# Patient Record
Sex: Male | Born: 1935 | Race: White | Hispanic: No | Smoking: Current every day smoker
Health system: Southern US, Community
[De-identification: ages and names within clinical notes are randomized; demographics above are authoritative.]

## PROBLEM LIST (undated history)

## (undated) DIAGNOSIS — E538 Deficiency of other specified B group vitamins: Secondary | ICD-10-CM

## (undated) DIAGNOSIS — S72001A Fracture of unspecified part of neck of right femur, initial encounter for closed fracture: Secondary | ICD-10-CM

## (undated) DIAGNOSIS — Z9119 Patient's noncompliance with other medical treatment and regimen: Secondary | ICD-10-CM

## (undated) DIAGNOSIS — I4892 Unspecified atrial flutter: Secondary | ICD-10-CM

## (undated) DIAGNOSIS — I10 Essential (primary) hypertension: Secondary | ICD-10-CM

## (undated) DIAGNOSIS — R55 Syncope and collapse: Secondary | ICD-10-CM

## (undated) DIAGNOSIS — G629 Polyneuropathy, unspecified: Secondary | ICD-10-CM

## (undated) DIAGNOSIS — E46 Unspecified protein-calorie malnutrition: Secondary | ICD-10-CM

## (undated) DIAGNOSIS — K56609 Unspecified intestinal obstruction, unspecified as to partial versus complete obstruction: Secondary | ICD-10-CM

## (undated) DIAGNOSIS — K567 Ileus, unspecified: Secondary | ICD-10-CM

## (undated) DIAGNOSIS — K859 Acute pancreatitis without necrosis or infection, unspecified: Secondary | ICD-10-CM

## (undated) DIAGNOSIS — E785 Hyperlipidemia, unspecified: Secondary | ICD-10-CM

## (undated) HISTORY — DX: Acute pancreatitis without necrosis or infection, unspecified: K85.90

## (undated) HISTORY — DX: Hyperlipidemia, unspecified: E78.5

## (undated) HISTORY — DX: Syncope and collapse: R55

## (undated) HISTORY — DX: Fracture of unspecified part of neck of right femur, initial encounter for closed fracture: S72.001A

## (undated) HISTORY — DX: Deficiency of other specified B group vitamins: E53.8

## (undated) HISTORY — DX: Unspecified intestinal obstruction, unspecified as to partial versus complete obstruction: K56.609

## (undated) HISTORY — PX: BACK SURGERY: SHX140

## (undated) HISTORY — DX: Unspecified atrial flutter: I48.92

---

## 1963-10-17 HISTORY — PX: LAMINECTOMY: SHX219

## 1999-01-30 ENCOUNTER — Emergency Department (HOSPITAL_COMMUNITY): Admission: EM | Admit: 1999-01-30 | Discharge: 1999-01-30 | Payer: Self-pay | Admitting: Emergency Medicine

## 2002-07-15 ENCOUNTER — Encounter: Payer: Self-pay | Admitting: Family Medicine

## 2002-07-15 ENCOUNTER — Encounter: Admission: RE | Admit: 2002-07-15 | Discharge: 2002-07-15 | Payer: Self-pay | Admitting: Family Medicine

## 2002-09-17 ENCOUNTER — Ambulatory Visit (HOSPITAL_BASED_OUTPATIENT_CLINIC_OR_DEPARTMENT_OTHER): Admission: RE | Admit: 2002-09-17 | Discharge: 2002-09-17 | Payer: Self-pay | Admitting: Surgery

## 2002-09-17 ENCOUNTER — Encounter (INDEPENDENT_AMBULATORY_CARE_PROVIDER_SITE_OTHER): Payer: Self-pay | Admitting: *Deleted

## 2004-06-03 ENCOUNTER — Encounter: Admission: RE | Admit: 2004-06-03 | Discharge: 2004-06-03 | Payer: Self-pay | Admitting: Family Medicine

## 2004-08-25 ENCOUNTER — Ambulatory Visit: Payer: Self-pay | Admitting: Family Medicine

## 2005-01-23 ENCOUNTER — Ambulatory Visit: Payer: Self-pay | Admitting: Family Medicine

## 2005-01-23 ENCOUNTER — Encounter: Admission: RE | Admit: 2005-01-23 | Discharge: 2005-01-23 | Payer: Self-pay | Admitting: Family Medicine

## 2005-01-26 ENCOUNTER — Ambulatory Visit: Payer: Self-pay | Admitting: Family Medicine

## 2005-02-06 ENCOUNTER — Ambulatory Visit (HOSPITAL_BASED_OUTPATIENT_CLINIC_OR_DEPARTMENT_OTHER): Admission: RE | Admit: 2005-02-06 | Discharge: 2005-02-06 | Payer: Self-pay | Admitting: General Surgery

## 2005-06-21 ENCOUNTER — Ambulatory Visit: Payer: Self-pay | Admitting: Pulmonary Disease

## 2005-07-14 ENCOUNTER — Ambulatory Visit: Payer: Self-pay

## 2005-07-18 ENCOUNTER — Ambulatory Visit: Payer: Self-pay | Admitting: Pulmonary Disease

## 2005-07-24 ENCOUNTER — Ambulatory Visit: Admission: RE | Admit: 2005-07-24 | Discharge: 2005-07-24 | Payer: Self-pay | Admitting: Pulmonary Disease

## 2005-07-24 ENCOUNTER — Ambulatory Visit: Payer: Self-pay | Admitting: Pulmonary Disease

## 2005-08-21 ENCOUNTER — Ambulatory Visit: Payer: Self-pay | Admitting: Pulmonary Disease

## 2010-09-17 ENCOUNTER — Inpatient Hospital Stay (HOSPITAL_COMMUNITY)
Admission: EM | Admit: 2010-09-17 | Discharge: 2010-09-22 | Payer: Self-pay | Source: Home / Self Care | Attending: Internal Medicine | Admitting: Internal Medicine

## 2010-09-18 ENCOUNTER — Encounter (INDEPENDENT_AMBULATORY_CARE_PROVIDER_SITE_OTHER): Payer: Self-pay | Admitting: Internal Medicine

## 2010-09-22 ENCOUNTER — Encounter: Payer: Self-pay | Admitting: Internal Medicine

## 2010-09-26 ENCOUNTER — Ambulatory Visit: Payer: Self-pay | Admitting: Internal Medicine

## 2010-10-05 ENCOUNTER — Telehealth (INDEPENDENT_AMBULATORY_CARE_PROVIDER_SITE_OTHER): Payer: Self-pay | Admitting: Radiology

## 2010-10-05 ENCOUNTER — Encounter: Payer: Self-pay | Admitting: Internal Medicine

## 2010-10-05 DIAGNOSIS — I739 Peripheral vascular disease, unspecified: Secondary | ICD-10-CM

## 2010-10-06 ENCOUNTER — Encounter: Payer: Self-pay | Admitting: Cardiology

## 2010-10-06 ENCOUNTER — Encounter (HOSPITAL_COMMUNITY)
Admission: RE | Admit: 2010-10-06 | Discharge: 2010-11-15 | Payer: Self-pay | Source: Home / Self Care | Attending: Internal Medicine | Admitting: Internal Medicine

## 2010-10-06 ENCOUNTER — Ambulatory Visit: Payer: Self-pay

## 2010-10-06 ENCOUNTER — Encounter: Payer: Self-pay | Admitting: Internal Medicine

## 2010-10-20 ENCOUNTER — Telehealth: Payer: Self-pay | Admitting: Internal Medicine

## 2010-11-01 ENCOUNTER — Ambulatory Visit
Admission: RE | Admit: 2010-11-01 | Discharge: 2010-11-01 | Payer: Self-pay | Source: Home / Self Care | Attending: Internal Medicine | Admitting: Internal Medicine

## 2010-11-01 DIAGNOSIS — I4891 Unspecified atrial fibrillation: Secondary | ICD-10-CM | POA: Insufficient documentation

## 2010-11-01 DIAGNOSIS — I255 Ischemic cardiomyopathy: Secondary | ICD-10-CM | POA: Insufficient documentation

## 2010-11-01 DIAGNOSIS — D509 Iron deficiency anemia, unspecified: Secondary | ICD-10-CM | POA: Insufficient documentation

## 2010-11-10 ENCOUNTER — Telehealth: Payer: Self-pay | Admitting: Internal Medicine

## 2010-11-17 NOTE — Assessment & Plan Note (Addendum)
Summary: Cardiology Nuclear Testing  Nuclear Med Background Indications for Stress Test: Evaluation for Ischemia, Post Hospital   History: COPD, Echo, Emphysema  History Comments: 09/18/10 Echo NML EF; 09/21/10 paroxsymal tach.   Symptoms: DOE, Palpitations, SOB, Syncope    Nuclear Pre-Procedure Cardiac Risk Factors: Hypertension, Lipids, NIDDM, Smoker Caffeine/Decaff Intake: None NPO After: 6:00 PM Lungs: clear IV 0.9% NS with Angio Cath: 22g     IV Site: R Wrist IV Started by: Irean Hong, RN Chest Size (in) 46     Height (in): 70 Weight (lb): 206 BMI: 29.66 Tech Comments: Held metoprolol this am. The patient took glipizide, and metformin this am. FBS here at 9:20AM was 80, cranberry juice given.  Nuclear Med Study 1 or 2 day study:  1 day     Stress Test Type:  Eugenie Birks Reading MD:  Olga Millers, MD     Referring MD:  S.Klein Resting Radionuclide:  Technetium 17m Tetrofosmin     Resting Radionuclide Dose:  11.0 mCi  Stress Radionuclide:  Technetium 7m Tetrofosmin     Stress Radionuclide Dose:  33.0 mCi   Stress Protocol  Max Systolic BP: 153 mm Hg Lexiscan: 0.4 mg   Stress Test Technologist:  Milana Na, EMT-P     Nuclear Technologist:  Doyne Keel, CNMT  Stress Procedure  The patient received IV Lexiscan 0.4 mg over 15-seconds.  Technetium 20m Tetrofosmin injected at 30-seconds.  There were no significant changes and occ pvcs/pacs with infusion.  Quantitative spect images were obtained after a 45 minute delay.  QPS Raw Data Images:  There is no interference from other nuclear activity. Stress Images:  There is decreased uptake in the inferior wall at the base. Rest Images:  There is decreased uptake in the inferior wall at the base, less prominent compared to the stress images. 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 Transient Ischemic Dilatation:  1.16  (Normal <1.22)  Lung/Heart  Ratio:  0.21  (Normal <0.45)  Quantitative Gated Spect Images QGS EDV:  134 ml QGS ESV:  79 ml QGS EF:  41 % QGS cine images:  Inferobasal akinesis; LVE.   Overall Impression  Exercise Capacity: Lexiscan with no exercise. BP Response: Normal blood pressure response. Clinical Symptoms: No chest pain ECG Impression: No significant ST segment change suggestive of ischemia. Overall Impression: Abnormal lexiscan nuclear study with prior inferobasal infarct and mild peri-infarct ischemia.  Appended Document: Cardiology Nuclear Testing lmfcb Claris Gladden, RN, BSN   10/18/10 1308  Appended Document: Cardiology Nuclear Testing adv pt daughter of results and f/u appt for 1/17 at 2:00. Claris Gladden, RN, BSN    Appended Document: Cardiology Nuclear Testing SPOKE WITH DAUGHTER HAD APPT ON 11/01/10 AND DISCUSSED MYOVIEW HAS RETURN APPT THE END OF FEB./CY

## 2010-11-17 NOTE — Letter (Signed)
Summary: [facility]  [facility]   Imported By: Marylou Mccoy 10/12/2010 17:13:28  _____________________________________________________________________  External Attachment:    Type:   Image     Comment:   External Document

## 2010-11-17 NOTE — Progress Notes (Signed)
Summary: nuc pre-procedure  Phone Note Outgoing Call   Call placed by: Domenic Polite, CNMT,  October 05, 2010 10:55 AM Call placed to: Patient Reason for Call: Confirm/change Appt Summary of Call: Reviewed information on Myoview Information Sheet (see scanned document for further details).  Spoke with patient.      Nuclear Med Background Indications for Stress Test: Evaluation for Ischemia, Post Hospital   History: COPD, Echo, Emphysema  History Comments: 09/18/10 Echo NML EF; 09/21/10 paroxsymal tach.   Symptoms: DOE, Syncope    Nuclear Pre-Procedure Cardiac Risk Factors: Hypertension, Lipids, NIDDM, Smoker

## 2010-11-17 NOTE — Miscellaneous (Signed)
Summary: Orders Update  Clinical Lists Changes  Problems: Added new problem of UNSPECIFIED PERIPHERAL VASCULAR DISEASE (ICD-443.9) Orders: Added new Test order of Arterial Duplex Lower Extremity (Arterial Duplex Low) - Signed 

## 2010-11-17 NOTE — Progress Notes (Signed)
Summary: pt daughter rtn call   Phone Note Call from Patient Call back at [phone]   Caller: Daughter/Preston Moore Reason for Call: Talk to Nurse, Talk to Doctor Summary of Call: pt daughter rtn call she is in her office so if you call and get answering machine just leave a message where she can reach you and she will call back. Initial call taken by: Omer Jack,  October 20, 2010 10:52 AM  Follow-up for Phone Call        spoke w/pt daughter and adv of results of stress test and moved appt to 1/17 at 2:00.  Follow-up by: Claris Gladden RN,  October 20, 2010 1:23 PM

## 2010-11-17 NOTE — Progress Notes (Signed)
Summary: calling about medications  Medications Added VICODIN ES 7.5-750 MG TABS (HYDROCODONE-ACETAMINOPHEN) three times a day       Phone Note Call from Patient Call back at [phone]   Caller: Daughter/ Cyndi Lennert Summary of Call: Pt daughter request call about the pt medications Initial call taken by: Judie Grieve,  November 10, 2010 10:02 AM  Follow-up for Phone Call        daughter called to update fathers med list.  Follow-up by: Claris Gladden RN,  November 10, 2010 2:26 PM    New/Updated Medications: VICODIN ES 7.5-750 MG TABS (HYDROCODONE-ACETAMINOPHEN) three times a day

## 2010-11-17 NOTE — Assessment & Plan Note (Signed)
Summary: eph/f/u for stress, monitor & LEA/jml  Medications Added PENTOXIFYLLINE CR 400 MG CR-TABS (PENTOXIFYLLINE) 1 by mouth three times a day METFORMIN HCL 850 MG TABS (METFORMIN HCL) 1 by mouth two times a day MULTIVITAMINS   TABS (MULTIPLE VITAMIN) 1 by mouth daily GLUCOTROL 10 MG TABS (GLIPIZIDE) 1 by mouth two times a day SIMVASTATIN 20 MG TABS (SIMVASTATIN) 1 by mouth daily ENALAPRIL MALEATE 2.5 MG TABS (ENALAPRIL MALEATE) 1 by mouth daily-- HOLD PROTONIX 40 MG TBEC (PANTOPRAZOLE SODIUM) 1 by mouth two times a day METOPROLOL TARTRATE 25 MG TABS (METOPROLOL TARTRATE) 1 by mouth two times a day      Allergies Added: NKDA  History of Present Illness: [name] is seen following a recent hospitalization for syncope attributed to orthostatic intolerance.  While in hospital he was n mias including atrial flutter as well as SVT thought by me to be a DNR T. Echo demonstrated normal left ventricular function with an inferior wall motion abnormality. He also somehow iron deficiency anemia and Coumadin was withheld.  He comes in today saying he feels great. His daughter is not convinced that he as well as he says. Interval Myoview scanning demonstrated ejection fraction 41% with prior infarct in peri-infarct ischemia A3 week is not recorder demonstrated frequent episodes of atrial tachycardia without clear evidence of atrial fibrillation. Heart rates were frequently in the 120s 130s. No clear atrial fibrillation was noted.    Current Medications (verified): 1)  Pentoxifylline Cr 400 Mg Cr-Tabs (Pentoxifylline) .Marland Kitchen.. 1 By Mouth Three Times A Day 2)  Metformin Hcl 850 Mg Tabs (Metformin Hcl) .Marland Kitchen.. 1 By Mouth Two Times A Day 3)  Aspirin 81 Mg  Tabs (Aspirin) .Marland Kitchen.. 1 By Mouth Daily 4)  Multivitamins   Tabs (Multiple Vitamin) .Marland Kitchen.. 1 By Mouth Daily 5)  Glucotrol 10 Mg Tabs (Glipizide) .Marland Kitchen.. 1 By Mouth Two Times A Day 6)  Simvastatin 20 Mg Tabs (Simvastatin) .Marland Kitchen.. 1 By Mouth Daily 7)  Enalapril  Maleate 2.5 Mg Tabs (Enalapril Maleate) .Marland Kitchen.. 1 By Mouth Daily-- Hold 8)  Protonix 40 Mg Tbec (Pantoprazole Sodium) .Marland Kitchen.. 1 By Mouth Two Times A Day 9)  Metoprolol Tartrate 25 Mg Tabs (Metoprolol Tartrate) .Marland Kitchen.. 1 By Mouth Two Times A Day  Allergies (verified): No Known Drug Allergies  Past History:  Past Medical History: Last updated: 10/31/2010 Diabetes Type 2 Hyperlipidemia Hypertension Neurologic Disorder Syncope  Family History: Last updated: 10/31/2010 Negative FH of Diabetes, Hypertension, or Coronary Artery Disease  Social History: Last updated: 10/31/2010 Widowed  Tobacco Use - Yes.  Alcohol Use - yes  Vital Signs:  Patient profile:   77 year old male Height:      70 inches Weight:      212 pounds BMI:     30.53 Pulse rate:   93 / minute Resp:     16 per minute BP sitting:   137 / 79  (right arm)  Vitals Entered By: Marrion Coy, CNA (November 01, 2010 2:35 PM)  Physical Exam  General:  The patient was alert and oriented in no acute distress. HEENT Normal.  Neck veins were flat, carotids were brisk.  Lungs were clear.  Heart sounds were regular without murmurs or gallops.  Abdomen was soft with active bowel sounds. There is no clubbing cyanosis or edema. Skin Warm and dry    Impression & Recommendations:  Problem # 1:  ATRIAL FIBRILLATION (ICD-427.31) The patient has had atrial fibrillation/flutter. He will need anticoagulation once we get clearance from  GI to its being okay to resume. Given the paucity of symptoms, I am reluctant to begin rate control and medications as he is sometimes he towards bradycardia noted in hospital and treated to obstructive sleep apnea; he has also had some bradycardia noted on his monitor His updated medication list for this problem includes:    Metoprolol Tartrate 25 Mg Tabs (Metoprolol tartrate) .Marland Kitchen... 1 by mouth two times a day  Problem # 2:  ISCHEMIC CARDIOMYOPATHY (ICD-414.8) We will plan to pursue empiric therapy for  this. He is on statins already. We'll continue him on beta blockers and ACE inhibitors for his modest left ventricular dysfunction. Again in the absence of symptoms will not pursue this further His updated medication list for this problem includes:    Enalapril Maleate 2.5 Mg Tabs (Enalapril maleate) .Marland Kitchen... 1 by mouth daily-- hold    Metoprolol Tartrate 25 Mg Tabs (Metoprolol tartrate) .Marland Kitchen... 1 by mouth two times a day  Problem # 3:  ANEMIA, IRON DEFICIENCY (ICD-280.9) he has been on iron repletion therapy. He'll followup with GI  Patient Instructions: 1)  Your physician recommends that you schedule a follow-up appointment in: 4-6 weeks. 2)  Your physician has recommended you make the following change in your medication: Stop Aspirin. Prescriptions: ENALAPRIL MALEATE 2.5 MG TABS (ENALAPRIL MALEATE) 1 by mouth daily-- HOLD  #30 x 11   Entered by:   Claris Gladden RN   Authorized by:   Nathen May, MD, [facility]   Signed by:   Claris Gladden RN on 11/01/2010   Method used:   Electronically to        [facility] (retail)       [address]       [place], [place]  [address]       Ph: [phone] or [phone]       Fax: [phone]   RxID:   [id number]

## 2010-11-23 ENCOUNTER — Encounter: Payer: Self-pay | Admitting: Internal Medicine

## 2010-11-29 ENCOUNTER — Other Ambulatory Visit: Payer: Self-pay | Admitting: Gastroenterology

## 2010-11-29 ENCOUNTER — Ambulatory Visit (HOSPITAL_COMMUNITY)
Admission: RE | Admit: 2010-11-29 | Discharge: 2010-11-29 | Disposition: A | Discharge status: Home / Self Care | Payer: Medicare Other | Source: Ambulatory Visit | Attending: Gastroenterology | Admitting: Gastroenterology

## 2010-11-29 DIAGNOSIS — K5520 Angiodysplasia of colon without hemorrhage: Secondary | ICD-10-CM | POA: Insufficient documentation

## 2010-11-29 DIAGNOSIS — K269 Duodenal ulcer, unspecified as acute or chronic, without hemorrhage or perforation: Secondary | ICD-10-CM | POA: Insufficient documentation

## 2010-11-29 DIAGNOSIS — K5730 Diverticulosis of large intestine without perforation or abscess without bleeding: Secondary | ICD-10-CM | POA: Insufficient documentation

## 2010-11-29 DIAGNOSIS — K449 Diaphragmatic hernia without obstruction or gangrene: Secondary | ICD-10-CM | POA: Insufficient documentation

## 2010-11-29 DIAGNOSIS — K644 Residual hemorrhoidal skin tags: Secondary | ICD-10-CM | POA: Insufficient documentation

## 2010-11-29 DIAGNOSIS — D126 Benign neoplasm of colon, unspecified: Secondary | ICD-10-CM | POA: Insufficient documentation

## 2010-11-29 DIAGNOSIS — K922 Gastrointestinal hemorrhage, unspecified: Secondary | ICD-10-CM | POA: Insufficient documentation

## 2010-11-29 DIAGNOSIS — K648 Other hemorrhoids: Secondary | ICD-10-CM | POA: Insufficient documentation

## 2010-11-29 DIAGNOSIS — Z01812 Encounter for preprocedural laboratory examination: Secondary | ICD-10-CM | POA: Insufficient documentation

## 2010-11-29 DIAGNOSIS — D649 Anemia, unspecified: Secondary | ICD-10-CM | POA: Insufficient documentation

## 2010-11-29 DIAGNOSIS — K209 Esophagitis, unspecified without bleeding: Secondary | ICD-10-CM | POA: Insufficient documentation

## 2010-11-29 DIAGNOSIS — R195 Other fecal abnormalities: Secondary | ICD-10-CM | POA: Insufficient documentation

## 2010-11-29 DIAGNOSIS — K2940 Chronic atrophic gastritis without bleeding: Secondary | ICD-10-CM | POA: Insufficient documentation

## 2010-11-29 LAB — GLUCOSE, CAPILLARY: Glucose-Capillary: 174 mg/dL — ABNORMAL HIGH (ref 70–99)

## 2010-12-12 ENCOUNTER — Encounter: Payer: Self-pay | Admitting: Internal Medicine

## 2010-12-12 ENCOUNTER — Other Ambulatory Visit: Payer: Self-pay | Admitting: Internal Medicine

## 2010-12-12 ENCOUNTER — Ambulatory Visit (INDEPENDENT_AMBULATORY_CARE_PROVIDER_SITE_OTHER): Payer: Medicare Other | Admitting: Internal Medicine

## 2010-12-12 DIAGNOSIS — D509 Iron deficiency anemia, unspecified: Secondary | ICD-10-CM

## 2010-12-12 DIAGNOSIS — I2589 Other forms of chronic ischemic heart disease: Secondary | ICD-10-CM

## 2010-12-12 DIAGNOSIS — I471 Supraventricular tachycardia: Secondary | ICD-10-CM

## 2010-12-12 DIAGNOSIS — D649 Anemia, unspecified: Secondary | ICD-10-CM

## 2010-12-13 LAB — CBC WITH DIFFERENTIAL/PLATELET
Basophils Absolute: 0 10*3/uL (ref 0.0–0.1)
Eosinophils Absolute: 0 10*3/uL (ref 0.0–0.7)
Lymphocytes Relative: 36.8 % (ref 12.0–46.0)
MCHC: 33.5 g/dL (ref 30.0–36.0)
Monocytes Relative: 6.9 % (ref 3.0–12.0)
Neutrophils Relative %: 55.4 % (ref 43.0–77.0)
RBC: 4.59 Mil/uL (ref 4.22–5.81)
RDW: 16.9 % — ABNORMAL HIGH (ref 11.5–14.6)

## 2010-12-22 NOTE — Assessment & Plan Note (Signed)
Summary: 6 wk f/u @ 2pm/ok per gesila/sl/jml      Allergies Added: NKDA  Visit Type:  Follow-up Primary Provider:  Dr Clovis Riley  CC:  no complaints.  History of Present Illness: [name] is seen following a recent hospitalization for syncope attributed to orthostatic intolerance.  While in hospital he was found to have atrial fibrillation and flutter. . Echo demonstrated normal left ventricular function with an inferior wall motion abnormality. He also somehow iron deficiency anemia and Coumadin was withheld. iron was initially started and has suddenly been discontinued    Interval Myoview scanning demonstrated ejection fraction 41% with prior infarct in peri-infarct ischemia A3 week event recorder demonstrated frequent episodes of atrial tachycardia without clear evidence of atrial fibrillation. Heart rates were frequently in the 120s 130s. No clear atrial fibrillation was noted.  At home his oximeter recorded heart rates mostly in the 60-100 range occasionally as high as 110.  He notes no changes in his exercise tolerance  He comes in today saying he feels great and that he felt any better he would know what to do  We reviewed his endoscopy and colonoscopy reports. Adenomas were identified no malignancy was seen   Current Medications (verified): 1)  Pentoxifylline Cr 400 Mg Cr-Tabs (Pentoxifylline) .Marland Kitchen.. 1 By Mouth Three Times A Day 2)  Metformin Hcl 850 Mg Tabs (Metformin Hcl) .Marland Kitchen.. 1 By Mouth Two Times A Day 3)  Multivitamins   Tabs (Multiple Vitamin) .Marland Kitchen.. 1 By Mouth Daily 4)  Glucotrol 10 Mg Tabs (Glipizide) .Marland Kitchen.. 1 By Mouth Two Times A Day 5)  Simvastatin 20 Mg Tabs (Simvastatin) .Marland Kitchen.. 1 By Mouth Daily 6)  Protonix 40 Mg Tbec (Pantoprazole Sodium) .Marland Kitchen.. 1 By Mouth Two Times A Day 7)  Metoprolol Tartrate 25 Mg Tabs (Metoprolol Tartrate) .Marland Kitchen.. 1 By Mouth Two Times A Day 8)  Vicodin Es 7.5-750 Mg Tabs (Hydrocodone-Acetaminophen) .... Three Times A Day  Allergies (verified): No Known  Drug Allergies  Vital Signs:  Patient profile:   77 year old male Height:      70 inches Weight:      206 pounds BMI:     29.66 Pulse rate:   95 / minute BP sitting:   128 / 62  (left arm) Cuff size:   regular  Vitals Entered By: Hardin Negus, RMA (December 12, 2010 2:25 PM)  Physical Exam  General:  The patient was alert and oriented in no acute distress.Neck veins were flat, carotids were brisk. Lungs were clear. Heart sounds were irregular without murmurs or gallops. Abdomen was soft with active bowel sounds. There is no clubbing cyanosis or edema.   Intervals 0.14/2106.36 Axis is 63 Occasional PVC  Impression & Recommendations:  Problem # 1:  ANEMIA, IRON DEFICIENCY (ICD-280.9)  we'll recheck his hemoglobin today;  status post colonoscopy  Orders: TLB-CBC Platelet - w/Differential (85025-CBCD)  Problem # 2:  ISCHEMIC CARDIOMYOPATHY (ICD-414.8) stable on current medications; inclined to try to resume his enalapril The following medications were removed from the medication list:    Enalapril Maleate 2.5 Mg Tabs (Enalapril maleate) .Marland Kitchen... 1 by mouth daily-- hold His updated medication list for this problem includes:    Metoprolol Tartrate 25 Mg Tabs (Metoprolol tartrate) .Marland Kitchen... 1 by mouth two times a day  Problem # 3:  ATRIAL FIBRILLATION (ICD-427.31)  no definite atrial fibrillation; I suspect this all represents an atrial tachycardia His updated medication list for this problem includes:    Metoprolol Tartrate 25 Mg Tabs (Metoprolol tartrate) .Marland KitchenMarland KitchenMarland KitchenMarland Kitchen  1 by mouth two times a day  Orders: EKG w/ Interpretation (93000)  Problem # 4:  UNSPECIFIED PERIPHERAL VASCULAR DISEASE (ICD-443.9) as noted he had ended up is a black toe . somebody put him on   pentoxifylline an issue resolved. He has been on it ever since.  Patient Instructions: 1)  Your physician recommends that you return for lab work in: TODAY CBC  285.9 2)  Your physician recommends that you continue on your  current medications as directed. Please refer to the Current Medication list given to you today. 3)  Your physician wants you to follow-up in:  6 MONTHS WITH DR Logan Bores will receive a reminder letter in the mail two months in advance. If you don't receive a letter, please call our office to schedule the follow-up appointment.

## 2010-12-23 NOTE — Op Note (Signed)
Preston Moore, Preston Moore NO.:  [id number]  MEDICAL RECORD NO.:  [id number]           PATIENT TYPE:  O  LOCATION:  WLEN                         FACILITY:  [facility]  PHYSICIAN:  Petra Kuba, M.D.    DATE OF BIRTH:  Mar 09, 1934  DATE OF PROCEDURE: DATE OF DISCHARGE:                              OPERATIVE REPORT   PROCEDURE:  Esophagogastroduodenoscopy with biopsy.  INDICATION:  The patient with history of esophageal ulcers, duodenal ulcers and GI bleeding.  We want to reevaluate.  Consent was signed after risks, benefits, methods and options were thoroughly discussed multiple times in the past.  ADDITIONAL MEDICINES FOR THIS PROCEDURE:  Fentanyl 40 mcg, Versed 3 mg.  PROCEDURE IN DETAIL:  The video endoscope was inserted by direct vision. He did have a small hiatal hernia.  There was some linear distal esophagitis and small erosions.  The mid and proximal esophagus were normal.  The scope passed into the stomach and he did have some linear antritis mild as well as some proximal gastritis which had a portal gastropathy type of appearance.  The scope was inserted through a normal antrum, normal pylorus, into the duodenal bulb where some shallow erosions were seen which continued on into the second portion of the duodenum.  No bleeding was seen.  The scope was withdrawn back to the bulb and the larger ulcers had healed, but there was still some shallow erosions and ulcers.  The scope was withdrawn back to the stomach and we attempted to retroflex, but not great visualization was done due to inability to hold air, but a quick look at the cardia, fundus, angularis, lesser and greater curve on retroflex confirmed the gastritis and ruled out any other abnormalities.  Straight visualization of the stomach did not reveal any additional findings.  Two biopsies of the antrum and two of the proximal stomach were obtained to rule out Helicobacter and possibly portal  gastropathy.  Air was suctioned.  The scope was slowly withdrawn again.  A good look at the esophagus confirmed the above findings.  The scope was removed.  A quick look at the vocal cords were normal.  The patient tolerated the procedure well. There was no obvious immediate complication.  ENDOSCOPIC DIAGNOSES: 1. Small hiatal hernia with mild-to-moderate distal esophagitis     continuing. 2. Mild antritis with some proximal gastritis which looked more portal     gastropathy status post biopsy. 3. Tiny duodenal erosions both in the bulb and the second portion of     the duodenum. 4. Otherwise normal esophagogastroduodenoscopy.  PLAN:  Continue b.i.d. pump inhibitors.  Continue to hold aspirin and nonsteroidals.  Await pathology.  We will follow up p.r.n. or in 2 months to check on him and make sure no further workup plans are needed.          ______________________________ Petra Kuba, M.D.     MEM/MEDQ  D:  11/29/2010  T:  11/29/2010  Job:  [id number]  cc:   Petra Kuba, M.D. Fax: [phone]  [facility] Clock Fax: [phone]  Electronically Signed by Vida Rigger M.D. on  12/22/2010 07:59:17 AM

## 2010-12-23 NOTE — Op Note (Signed)
Preston Moore, NORDBY NO.:  [id number]  MEDICAL RECORD NO.:  [id number]           PATIENT TYPE:  O  LOCATION:  WLEN                         FACILITY:  [facility]  PHYSICIAN:  Petra Kuba, M.D.    DATE OF BIRTH:  08/05/1934  DATE OF PROCEDURE:  11/29/2010 DATE OF DISCHARGE:                              OPERATIVE REPORT   PROCEDURE:  Colonoscopy with polypectomy and [place] ink injection.  INDICATIONS:  Patient with anemia, guaiac positivity, and GI bleeding; overdue for colonic screening.  Consent was signed after risks, benefits, methods, options thoroughly discussed multiple times in the past.  MEDICINES USED: 1. Fentanyl 160 mcg. 2. Versed 14 mg.  PROCEDURE:  Rectal inspection is pertinent for external hemorrhoids. Small digital exam was negative.  The video colonoscope was inserted and easily advanced around the colon to the cecum.  No signs of bleeding were seen.  On insertion, a rare left-sided early diverticula was seen as well as two small nonbleeding cecal AVMs.  The scope was inserted a short ways in the terminal ileum, which was normal.  No blood was seen coming from above.  The scope was slowly withdrawn.  He did have four small to medium-sized sessile polyps, and in a piecemeal fashion, all were removed.  The proximal transverse and distal transverse were put in the same container.  The largest one was in the mid transverse and was put in a separate container, as was the proximal descending.  There was lots of spasm in removing the last two, and we elected to hot biopsy the base for some residual polyps on both of those.  The one in the midtransverse, after a few snares, we went ahead and elected to inject some [place] ink to mark the spot since it was slightly larger.  There were no signs of bleeding when we were done with the multiple polypectomies.  No other lesions were seen but the rare early diverticula as we slowly withdrew back to the rectum.   Anorectal pull- through and retroflexion confirmed some small hemorrhoids.  The scope was straightened and readvanced a short ways into the left side of the colon.  Air was suctioned, scope removed.  The patient tolerated the procedure well.  There was no obvious immediate complication.  ENDOSCOPIC DIAGNOSES: 1. Internal/external hemorrhoids. 2. Four small to medium-sized sessile polyps in the proximal     descending, distal mid, and proximal transverse.  All removed in a     piecemeal fashion with multiple snares, and the last two having hot     biopsies as above, and [place] ink being injected in the mid     transverse (a total of the two injections and 3 cc). 3. Rare early left-sided diverticula. 4. Two cecal nonbleeding small arteriovenous malformations. 5. Otherwise within normal limits to the terminal ileum.  PLAN:  Continue to hold the aspirin.  Await pathology to determine future screening.  Would recommend Diprivan on repeat colonoscopy and continue workup for a repeat endoscopy to document healing.          ______________________________ Petra Kuba, M.D.  MEM/MEDQ  D:  11/29/2010  T:  11/29/2010  Job:  [id number]  cc:   Dr. [facility]  Electronically Signed by Vida Rigger M.D. on 12/22/2010 07:59:11 AM

## 2010-12-27 LAB — DIFFERENTIAL
Basophils Absolute: 0 10*3/uL (ref 0.0–0.1)
Basophils Absolute: 0 10*3/uL (ref 0.0–0.1)
Basophils Relative: 0 % (ref 0–1)
Basophils Relative: 0 % (ref 0–1)
Basophils Relative: 0 % (ref 0–1)
Eosinophils Absolute: 0.1 10*3/uL (ref 0.0–0.7)
Eosinophils Absolute: 0.1 10*3/uL (ref 0.0–0.7)
Lymphocytes Relative: 30 % (ref 12–46)
Lymphocytes Relative: 39 % (ref 12–46)
Lymphs Abs: 1.8 10*3/uL (ref 0.7–4.0)
Lymphs Abs: 2.6 10*3/uL (ref 0.7–4.0)
Monocytes Absolute: 0.5 10*3/uL (ref 0.1–1.0)
Monocytes Absolute: 0.5 10*3/uL (ref 0.1–1.0)
Monocytes Relative: 7 % (ref 3–12)
Monocytes Relative: 7 % (ref 3–12)
Monocytes Relative: 8 % (ref 3–12)
Neutro Abs: 2.8 10*3/uL (ref 1.7–7.7)
Neutro Abs: 3 10*3/uL (ref 1.7–7.7)
Neutro Abs: 5.6 10*3/uL (ref 1.7–7.7)
Neutrophils Relative %: 46 % (ref 43–77)
Neutrophils Relative %: 51 % (ref 43–77)
Neutrophils Relative %: 64 % (ref 43–77)

## 2010-12-27 LAB — GLUCOSE, CAPILLARY
Glucose-Capillary: 136 mg/dL — ABNORMAL HIGH (ref 70–99)
Glucose-Capillary: 188 mg/dL — ABNORMAL HIGH (ref 70–99)
Glucose-Capillary: 213 mg/dL — ABNORMAL HIGH (ref 70–99)
Glucose-Capillary: 214 mg/dL — ABNORMAL HIGH (ref 70–99)
Glucose-Capillary: 228 mg/dL — ABNORMAL HIGH (ref 70–99)
Glucose-Capillary: 246 mg/dL — ABNORMAL HIGH (ref 70–99)
Glucose-Capillary: 265 mg/dL — ABNORMAL HIGH (ref 70–99)
Glucose-Capillary: 289 mg/dL — ABNORMAL HIGH (ref 70–99)
Glucose-Capillary: 305 mg/dL — ABNORMAL HIGH (ref 70–99)
Glucose-Capillary: 351 mg/dL — ABNORMAL HIGH (ref 70–99)
Glucose-Capillary: 382 mg/dL — ABNORMAL HIGH (ref 70–99)

## 2010-12-27 LAB — HEMOGLOBIN AND HEMATOCRIT, BLOOD
HCT: 27.6 % — ABNORMAL LOW (ref 39.0–52.0)
HCT: 30 % — ABNORMAL LOW (ref 39.0–52.0)
Hemoglobin: 10.4 g/dL — ABNORMAL LOW (ref 13.0–17.0)
Hemoglobin: 9.4 g/dL — ABNORMAL LOW (ref 13.0–17.0)
Hemoglobin: 9.5 g/dL — ABNORMAL LOW (ref 13.0–17.0)

## 2010-12-27 LAB — CBC
HCT: 24.8 % — ABNORMAL LOW (ref 39.0–52.0)
HCT: 25.2 % — ABNORMAL LOW (ref 39.0–52.0)
HCT: 25.2 % — ABNORMAL LOW (ref 39.0–52.0)
HCT: 39.8 % (ref 39.0–52.0)
Hemoglobin: 13.5 g/dL (ref 13.0–17.0)
Hemoglobin: 8.5 g/dL — ABNORMAL LOW (ref 13.0–17.0)
MCH: 33.3 pg (ref 26.0–34.0)
MCHC: 33.7 g/dL (ref 30.0–36.0)
MCHC: 33.9 g/dL (ref 30.0–36.0)
MCV: 98.8 fL (ref 78.0–100.0)
MCV: 99.2 fL (ref 78.0–100.0)
Platelets: 143 10*3/uL — ABNORMAL LOW (ref 150–400)
Platelets: 162 10*3/uL (ref 150–400)
RBC: 2.58 MIL/uL — ABNORMAL LOW (ref 4.22–5.81)
RBC: 3.99 MIL/uL — ABNORMAL LOW (ref 4.22–5.81)
RDW: 12.5 % (ref 11.5–15.5)
RDW: 12.6 % (ref 11.5–15.5)
RDW: 12.7 % (ref 11.5–15.5)
RDW: 12.8 % (ref 11.5–15.5)
WBC: 6 10*3/uL (ref 4.0–10.5)
WBC: 7.6 10*3/uL (ref 4.0–10.5)
WBC: 7.7 10*3/uL (ref 4.0–10.5)

## 2010-12-27 LAB — CARDIAC PANEL(CRET KIN+CKTOT+MB+TROPI)
CK, MB: 1.5 ng/mL (ref 0.3–4.0)
Relative Index: INVALID (ref 0.0–2.5)
Total CK: 145 U/L (ref 7–232)
Total CK: 44 U/L (ref 7–232)
Troponin I: 0.03 ng/mL (ref 0.00–0.06)
Troponin I: 0.03 ng/mL (ref 0.00–0.06)

## 2010-12-27 LAB — COMPREHENSIVE METABOLIC PANEL
ALT: 25 U/L (ref 0–53)
ALT: 26 U/L (ref 0–53)
AST: 36 U/L (ref 0–37)
Albumin: 2.8 g/dL — ABNORMAL LOW (ref 3.5–5.2)
Albumin: 2.9 g/dL — ABNORMAL LOW (ref 3.5–5.2)
Alkaline Phosphatase: 63 U/L (ref 39–117)
Alkaline Phosphatase: 63 U/L (ref 39–117)
BUN: 10 mg/dL (ref 6–23)
BUN: 2 mg/dL — ABNORMAL LOW (ref 6–23)
CO2: 28 mEq/L (ref 19–32)
Calcium: 8 mg/dL — ABNORMAL LOW (ref 8.4–10.5)
Creatinine, Ser: 0.71 mg/dL (ref 0.4–1.5)
GFR calc Af Amer: >60 mL/min (ref >60)
GFR calc non Af Amer: >60 mL/min (ref >60)
Glucose, Bld: 222 mg/dL — ABNORMAL HIGH (ref 70–99)
Potassium: 4 mEq/L (ref 3.5–5.1)
Potassium: 4.5 mEq/L (ref 3.5–5.1)
Sodium: 136 mEq/L (ref 135–145)
Sodium: 138 mEq/L (ref 135–145)
Total Bilirubin: 0.3 mg/dL (ref 0.3–1.2)
Total Bilirubin: 0.4 mg/dL (ref 0.3–1.2)
Total Protein: 4.7 g/dL — ABNORMAL LOW (ref 6.0–8.3)
Total Protein: 5.1 g/dL — ABNORMAL LOW (ref 6.0–8.3)

## 2010-12-27 LAB — POCT CARDIAC MARKERS
Myoglobin, poc: 102 ng/mL (ref 12–200)
Myoglobin, poc: 64.5 ng/mL (ref 12–200)
Troponin i, poc: <0.05 ng/mL (ref 0.00–0.09)
Troponin i, poc: <0.05 ng/mL (ref 0.00–0.09)

## 2010-12-27 LAB — URINALYSIS, ROUTINE W REFLEX MICROSCOPIC
Bilirubin Urine: NEGATIVE
Hgb urine dipstick: NEGATIVE
Specific Gravity, Urine: 1.026 (ref 1.005–1.030)
Urobilinogen, UA: 0.2 mg/dL (ref 0.0–1.0)

## 2010-12-27 LAB — MAGNESIUM
Magnesium: 1.8 mg/dL (ref 1.5–2.5)
Magnesium: 1.8 mg/dL (ref 1.5–2.5)

## 2010-12-27 LAB — STOOL CULTURE

## 2010-12-27 LAB — URINE CULTURE: Culture  Setup Time: 201112040203

## 2010-12-27 LAB — URINE MICROSCOPIC-ADD ON

## 2010-12-27 LAB — TSH: TSH: 1.867 u[IU]/mL (ref 0.350–4.500)

## 2010-12-27 LAB — POCT I-STAT, CHEM 8
Calcium, Ion: 1.07 mmol/L — ABNORMAL LOW (ref 1.12–1.32)
Chloride: 102 mEq/L (ref 96–112)
HCT: 41 % (ref 39.0–52.0)
Potassium: 4.6 mEq/L (ref 3.5–5.1)
Sodium: 137 mEq/L (ref 135–145)

## 2010-12-27 LAB — HEMOCCULT GUIAC POC 1CARD (OFFICE): Fecal Occult Bld: POSITIVE

## 2010-12-27 LAB — APTT: aPTT: 25 seconds (ref 24–37)

## 2010-12-27 LAB — PROTIME-INR: INR: 1.06 (ref 0.00–1.49)

## 2010-12-27 LAB — CLOSTRIDIUM DIFFICILE BY PCR: Toxigenic C. Difficile by PCR: NOT DETECTED

## 2010-12-27 LAB — HEMOGLOBIN A1C: Mean Plasma Glucose: 117 mg/dL — ABNORMAL HIGH (ref <117)

## 2011-03-03 NOTE — Op Note (Signed)
Preston Moore, Preston Moore                 ACCOUNT NO.:  [id number]   MEDICAL RECORD NO.:  [id number]          PATIENT TYPE:  OUT   LOCATION:  CARD                         FACILITY:  [facility]   PHYSICIAN:  Oley Balm. Sung Amabile, M.D. [facility] OF BIRTH:  13-May-1934   DATE OF PROCEDURE:  07/24/2005  DATE OF DISCHARGE:  07/24/2005                                 OPERATIVE REPORT   PROCEDURE:  Cardiopulmonary stress test.   INDICATION FOR TESTING:  Dyspnea.   DESCRIPTION OF PROCEDURE:  Cardiopulmonary stress testing was performed on a  graded treadmill. Testing was stopped due to back pain and dyspnea. Effort  was submaximal. Peak oxygen uptake was 13.8 mL/kg/minute or 61% of predicted  maximum indicating moderate impairment.   At peak exercise, heart rate was 140 beats per minute or 92% of predicted  maximum indicating that cardiovascular limitation was reached. Oxygen pulse  was low suggesting decreased left ventricular function. Blood pressure was  abnormal with a peak blood pressure of 206/97. EKG tracings revealed no  abnormalities.   At peak exercise, minute ventilation was 66 liters per minute or 49% of  predicted maximum indicating that ventilatory reserve remained. Gas exchange  parameters revealed no abnormalities. Baseline spirometer was normal.  Postexercise spirometry revealed no exercise-induced bronchospasm.   SUMMARY:  Moderate exercise impairment due to musculoskeletal pain.  Cardiovascular limitation was reached at peak exercise with a hypertensive  response. Also, oxygen pulse was low suggesting impaired stroke volume. If  not already done, consider echocardiogram for further evaluation. These  results have previously been discussed with Dr. Sherene Sires.           ______________________________  Oley Balm. Sung Amabile, M.D. [facility]     DBS/MEDQ  D:  09/12/2005  T:  09/13/2005  Job:  [id number]

## 2011-03-03 NOTE — Op Note (Signed)
Preston Moore, Preston Moore                 ACCOUNT NO.:  [id number]   MEDICAL RECORD NO.:  [id number]          PATIENT TYPE:  AMB   LOCATION:  DSC                          FACILITY:  [facility]   PHYSICIAN:  Gita Kudo, M.D. DATE OF BIRTH:  Jul 15, 1934   DATE OF PROCEDURE:  02/06/2005  DATE OF DISCHARGE:                                 OPERATIVE REPORT   PREOPERATIVE DIAGNOSIS:  Recurrent, infected sebaceous cyst.   POSTOPERATIVE DIAGNOSIS:  Recurrent, infected sebaceous cyst.   OPERATIVE PROCEDURE:  Excision and packing of recurrent, infected sebaceous  cyst - back.   SURGEON:  Gita Kudo, M.D.   ANESTHESIA:  1% Xylocaine.   CLINICAL SUMMARY:  A 77 year old diabetic male with an infected sebaceous  cyst of his back.  He had a cyst excised in the past and packed by Dr.  Jamey Ripa.  He came back to the office the other day.  It was somewhat red but  I thought we might be able to excise it and close it primarily.  However, it  was infected, he had pus and I could not do so.   DESCRIPTION OF PROCEDURE:  The patient was positioned, prepped and draped in  a standard fashion.  A total of 30 cc of 1% Xylocaine was infiltrated during  the procedure for good analgesia.  An elliptical incision was made,  encompassing the infected cystic material on the left side, the scar in the  middle and the other cyst on the residual right side.  The entire mass was  excised.  The cyst had ruptured and it was difficult to be sure if I had  removed it all.  Bleeders were oversewn with 2-0 chromic and then the wound  was packed with 4 x 4 gauzes and drained.  He will be followed as an  outpatient.      MRL/MEDQ  D:  02/06/2005  T:  02/06/2005  Job:  [id number]   cc:   Tinnie Gens A. Tawanna Cooler, M.D. [facility]

## 2011-03-28 ENCOUNTER — Ambulatory Visit (HOSPITAL_COMMUNITY)
Admission: RE | Admit: 2011-03-28 | Discharge: 2011-03-28 | Disposition: A | Discharge status: Home / Self Care | Payer: Medicare Other | Source: Ambulatory Visit | Attending: Family Medicine | Admitting: Family Medicine

## 2011-03-28 ENCOUNTER — Other Ambulatory Visit (HOSPITAL_COMMUNITY): Payer: Self-pay | Admitting: Family Medicine

## 2011-03-28 DIAGNOSIS — M949 Disorder of cartilage, unspecified: Secondary | ICD-10-CM | POA: Insufficient documentation

## 2011-03-28 DIAGNOSIS — R0789 Other chest pain: Secondary | ICD-10-CM | POA: Insufficient documentation

## 2011-03-28 DIAGNOSIS — R0781 Pleurodynia: Secondary | ICD-10-CM

## 2011-03-28 DIAGNOSIS — M899 Disorder of bone, unspecified: Secondary | ICD-10-CM | POA: Insufficient documentation

## 2011-05-01 ENCOUNTER — Emergency Department (HOSPITAL_COMMUNITY)
Admission: EM | Admit: 2011-05-01 | Discharge: 2011-05-02 | Disposition: A | Discharge status: Home / Self Care | Payer: Medicare Other | Attending: Emergency Medicine | Admitting: Emergency Medicine

## 2011-05-01 ENCOUNTER — Telehealth: Payer: Self-pay | Admitting: Cardiology

## 2011-05-01 DIAGNOSIS — I10 Essential (primary) hypertension: Secondary | ICD-10-CM | POA: Insufficient documentation

## 2011-05-01 DIAGNOSIS — Z794 Long term (current) use of insulin: Secondary | ICD-10-CM | POA: Insufficient documentation

## 2011-05-01 DIAGNOSIS — G5790 Unspecified mononeuropathy of unspecified lower limb: Secondary | ICD-10-CM | POA: Insufficient documentation

## 2011-05-01 DIAGNOSIS — M79609 Pain in unspecified limb: Secondary | ICD-10-CM | POA: Insufficient documentation

## 2011-05-01 DIAGNOSIS — R109 Unspecified abdominal pain: Secondary | ICD-10-CM | POA: Insufficient documentation

## 2011-05-01 DIAGNOSIS — K859 Acute pancreatitis without necrosis or infection, unspecified: Secondary | ICD-10-CM | POA: Insufficient documentation

## 2011-05-01 DIAGNOSIS — E119 Type 2 diabetes mellitus without complications: Secondary | ICD-10-CM | POA: Insufficient documentation

## 2011-05-01 DIAGNOSIS — I4949 Other premature depolarization: Secondary | ICD-10-CM | POA: Insufficient documentation

## 2011-05-01 DIAGNOSIS — R609 Edema, unspecified: Secondary | ICD-10-CM | POA: Insufficient documentation

## 2011-05-01 DIAGNOSIS — K7689 Other specified diseases of liver: Secondary | ICD-10-CM | POA: Insufficient documentation

## 2011-05-01 HISTORY — DX: Essential (primary) hypertension: I10

## 2011-05-01 HISTORY — DX: Polyneuropathy, unspecified: G62.9

## 2011-05-01 LAB — GLUCOSE, CAPILLARY: Glucose-Capillary: 266 mg/dL — ABNORMAL HIGH (ref 70–99)

## 2011-05-01 NOTE — Telephone Encounter (Signed)
Pt's dtr calling re 6 month recall letter he received, and has questions re why he needs a follow up, did something show on last visit, and what would the appt entail?

## 2011-05-01 NOTE — Telephone Encounter (Signed)
Reviewed the recommendations made by Dr Graciela Husbands at Preston Moore's last office visit to follow up in August.  appt was scheduled for Preston Moore.

## 2011-05-02 ENCOUNTER — Emergency Department (HOSPITAL_COMMUNITY): Payer: Medicare Other

## 2011-05-02 ENCOUNTER — Encounter (HOSPITAL_COMMUNITY): Payer: Self-pay | Admitting: Radiology

## 2011-05-02 LAB — URINALYSIS, ROUTINE W REFLEX MICROSCOPIC
Glucose, UA: NEGATIVE mg/dL
Hgb urine dipstick: NEGATIVE
Specific Gravity, Urine: 1.015 (ref 1.005–1.030)
Urobilinogen, UA: 0.2 mg/dL (ref 0.0–1.0)
pH: 5.5 (ref 5.0–8.0)

## 2011-05-02 LAB — COMPREHENSIVE METABOLIC PANEL
AST: 30 U/L (ref 0–37)
Albumin: 3.1 g/dL — ABNORMAL LOW (ref 3.5–5.2)
Alkaline Phosphatase: 81 U/L (ref 39–117)
BUN: 12 mg/dL (ref 6–23)
Creatinine, Ser: 0.92 mg/dL (ref 0.50–1.35)
Potassium: 3.4 mEq/L — ABNORMAL LOW (ref 3.5–5.1)
Total Protein: 6.7 g/dL (ref 6.0–8.3)

## 2011-05-02 LAB — DIFFERENTIAL
Eosinophils Relative: 1 % (ref 0–5)
Monocytes Relative: 13 % — ABNORMAL HIGH (ref 3–12)
Neutrophils Relative %: 40 % — ABNORMAL LOW (ref 43–77)

## 2011-05-02 LAB — GLUCOSE, CAPILLARY: Glucose-Capillary: 235 mg/dL — ABNORMAL HIGH (ref 70–99)

## 2011-05-02 LAB — CBC
HCT: 36.3 % — ABNORMAL LOW (ref 39.0–52.0)
Hemoglobin: 12.5 g/dL — ABNORMAL LOW (ref 13.0–17.0)
WBC: 4.8 10*3/uL (ref 4.0–10.5)

## 2011-05-02 LAB — LIPASE, BLOOD: Lipase: 118 U/L — ABNORMAL HIGH (ref 11–59)

## 2011-05-02 MED ORDER — IOHEXOL 300 MG/ML  SOLN
100.0000 mL | Freq: Once | INTRAMUSCULAR | Status: AC | PRN
  Administered 2011-05-02: 100 mL via INTRAVENOUS

## 2011-05-05 ENCOUNTER — Encounter: Payer: Self-pay | Admitting: Internal Medicine

## 2011-06-08 ENCOUNTER — Ambulatory Visit (INDEPENDENT_AMBULATORY_CARE_PROVIDER_SITE_OTHER): Payer: Medicare Other | Admitting: Internal Medicine

## 2011-06-08 ENCOUNTER — Encounter (INDEPENDENT_AMBULATORY_CARE_PROVIDER_SITE_OTHER): Payer: Medicare Other

## 2011-06-08 ENCOUNTER — Encounter: Payer: Self-pay | Admitting: Internal Medicine

## 2011-06-08 VITALS — BP 132/78 | HR 84 | Ht 70.0 in | Wt 206.0 lb

## 2011-06-08 DIAGNOSIS — R0602 Shortness of breath: Secondary | ICD-10-CM

## 2011-06-08 DIAGNOSIS — R42 Dizziness and giddiness: Secondary | ICD-10-CM

## 2011-06-08 DIAGNOSIS — R0600 Dyspnea, unspecified: Secondary | ICD-10-CM

## 2011-06-08 LAB — BASIC METABOLIC PANEL WITH GFR
BUN: 13 mg/dL (ref 6–23)
CO2: 21 meq/L (ref 19–32)
Calcium: 9.1 mg/dL (ref 8.4–10.5)
Chloride: 108 meq/L (ref 96–112)
Creatinine, Ser: 0.7 mg/dL (ref 0.4–1.5)
GFR: 120.91 mL/min
Glucose, Bld: 150 mg/dL — ABNORMAL HIGH (ref 70–99)
Potassium: 4.3 meq/L (ref 3.5–5.1)
Sodium: 138 meq/L (ref 135–145)

## 2011-06-08 MED ORDER — FUROSEMIDE 20 MG PO TABS: ORAL_TABLET | ORAL | Status: DC

## 2011-06-08 NOTE — Progress Notes (Signed)
  HPI  Preston Moore is a 77 y.o. male  History of atrial arrhythmias and a thromboembolic risk profile with the CHADS VASC of 4  I met him in the hospital early 2012 after presentation with syncope that was thought to be orthostatic in the setting of dehydration. Evaluation at that time demonstrated normal left ventricular function by echo with an inferior wall motion abnormality and Q waves on his EKG suggestive of an old infarct.Myoview scanning demonstrated ejection fraction 41% with prior infarct in peri-infarct ischemia  He also probable COPD and nocturnal bradycardia He was hospitalized and then sent to rehabilitation until a couple weeks ago. He was failing to thrive. He was drinking excessively smoking excessively. He has had episodes of dyspnea particularly at night which he had again last night. Apparently at the nursing home when he had these his saturations were measured at 98% as they were by EMS last night when they were called 6 hours after the onset. He impression at the nursing home was that these were related to anxiety. He has continued to smoke When he was in the emergency room wasn't long as hemoglobin was 12 so the knee was much improved. His potassium was low   Past Medical History  Diagnosis Date  . Hypertension   . Diabetes mellitus   . Neuropathy   . Hyperlipidemia   . Syncope and collapse     No past surgical history on file.  Current Outpatient Prescriptions  Medication Sig Dispense Refill  . enalapril (VASOTEC) 2.5 MG tablet Take 2.5 mg by mouth daily.        Marland Kitchen gabapentin (NEURONTIN) 300 MG capsule Take 300 mg by mouth 3 (three) times daily.        Marland Kitchen HYDROcodone-acetaminophen (VICODIN ES) 7.5-750 MG per tablet Take 1 tablet by mouth 3 (three) times daily.        . insulin aspart (NOVOLOG) 100 UNIT/ML injection Inject into the skin.        . metFORMIN (GLUCOPHAGE) 1000 MG tablet Take 1,000 mg by mouth 2 (two) times daily with a meal.        . metoprolol  tartrate (LOPRESSOR) 25 MG tablet Take 25 mg by mouth 2 (two) times daily.        . multivitamin (THERAGRAN) per tablet Take 1 tablet by mouth daily.        Marland Kitchen omeprazole (PRILOSEC) 20 MG capsule Take 20 mg by mouth daily.        . pentoxifylline (TRENTAL) 400 MG CR tablet Take 400 mg by mouth 3 (three) times daily with meals.        . simvastatin (ZOCOR) 20 MG tablet Take 20 mg by mouth daily.        Marland Kitchen zolpidem (AMBIEN) 10 MG tablet Take 10 mg by mouth at bedtime as needed.          No Known Allergies  Review of Systems negative except from HPI and PMH  Physical Exam Well developed and well nourished in no acute distress HENT normal E scleral and icterus clear Neck Supple JVP 9-10 cm carotids brisk and full Clear to ausculation Regular rate and rhythm, no murmurs gallops or rub Soft with active bowel sounds No clubbing cyanosis 1+Alert and oriented, grossly normal motor and sensory function Skin Warm and Dry  ECG sinus rhythm at 84 different intervals 0.1410/0.40 T Wave flattening  Assessment and  Plan

## 2011-06-08 NOTE — Patient Instructions (Signed)
Your physician has recommended that you wear an event monitor (MCOT 30day). This will be placed today.Event monitors are medical devices that record the heart's electrical activity. Doctors most often [place] these monitors to diagnose arrhythmias. Arrhythmias are problems with the speed or rhythm of the heartbeat. The monitor is a small, portable device. You can wear one while you do your normal daily activities. This is usually used to diagnose what is causing palpitations/syncope (passing out).  Your physician recommends that you have lab work today: bmp/bnp  Your physician recommends that you return for lab work in: 2 weeks- bmp (  Your physician has recommended you make the following change in your medication:  1) Start lasix (furosemide) 20mg  once daily.  Your physician recommends that you schedule a follow-up appointment in: 2 months.

## 2011-06-22 ENCOUNTER — Other Ambulatory Visit (INDEPENDENT_AMBULATORY_CARE_PROVIDER_SITE_OTHER): Payer: Medicare Other | Admitting: *Deleted

## 2011-06-22 DIAGNOSIS — R0989 Other specified symptoms and signs involving the circulatory and respiratory systems: Secondary | ICD-10-CM

## 2011-06-22 DIAGNOSIS — R0600 Dyspnea, unspecified: Secondary | ICD-10-CM

## 2011-06-22 LAB — BASIC METABOLIC PANEL
Calcium: 9.1 mg/dL (ref 8.4–10.5)
GFR: 89.79 mL/min (ref >60.00)
Glucose, Bld: 153 mg/dL — ABNORMAL HIGH (ref 70–99)
Sodium: 139 mEq/L (ref 135–145)

## 2011-06-26 ENCOUNTER — Ambulatory Visit (INDEPENDENT_AMBULATORY_CARE_PROVIDER_SITE_OTHER): Payer: Medicare Other | Admitting: Pulmonary Disease

## 2011-06-26 ENCOUNTER — Encounter: Payer: Self-pay | Admitting: Pulmonary Disease

## 2011-06-26 VITALS — BP 114/60 | HR 95 | Temp 97.9°F | Ht 70.0 in | Wt 207.6 lb

## 2011-06-26 DIAGNOSIS — R0609 Other forms of dyspnea: Secondary | ICD-10-CM

## 2011-06-26 DIAGNOSIS — R0600 Dyspnea, unspecified: Secondary | ICD-10-CM

## 2011-06-26 NOTE — Patient Instructions (Signed)
Will schedule for breathing tests, and see back same day for review. Work on smoking cessation.

## 2011-06-26 NOTE — Assessment & Plan Note (Signed)
The patient is describing 2 episodes of shortness of breath while at rest over the last few months, but is also noting dyspnea with mild to moderate exertional activity.  I suspect he does have some element of obstructive lung disease, and needs to have pulmonary function studies and reevaluation.  He may benefit from a bronchodilator regimen, depending upon the results.  It is unclear whether the patient may have a cardiac issue as well.  We'll go ahead and schedule him for PFTs, and see him back the same day to review

## 2011-06-26 NOTE — Progress Notes (Signed)
  Subjective:    Patient ID: Preston Moore, male    DOB: [birth year], 77 y.o.   MRN: [id number]  HPI The patient is a 77 year old male who I've been asked to see for evaluation of dyspnea.  He notes that he has had decreasing exercise tolerance over the last few years, but states that he has had worsening shortness of breath for the last one month.  He has had 2 episodes where all of a sudden while sitting he feels that he cannot get enough air.  He also notes dyspnea on exertion.  Patient describes a 2 block DOE at a moderate pace on flat ground.  He can also get winded with stairs, but has no issues from groceries in from the car.  He denies any cough or mucus production, and has not had chest pain.  He denies a significant issue with lower extremity edema.  He states that he had breathing studies many years ago, and was told that he had COPD.  He unfortunately continues to smoke, and has done so since age 89.  The patient has had an old Myoview study which shows a depressed ejection fraction.   Review of Systems  Constitutional: Negative for fever and unexpected weight change.  HENT: Positive for congestion. Negative for ear pain, nosebleeds, sore throat, rhinorrhea, sneezing, trouble swallowing, dental problem, postnasal drip and sinus pressure.   Eyes: Negative for redness and itching.  Respiratory: Positive for shortness of breath. Negative for cough, chest tightness and wheezing.   Cardiovascular: Negative for palpitations and leg swelling.  Gastrointestinal: Negative for nausea and vomiting.  Genitourinary: Negative for dysuria.  Musculoskeletal: Negative for joint swelling.  Skin: Negative for rash.  Neurological: Negative for headaches.  Hematological: Does not bruise/bleed easily.  Psychiatric/Behavioral: Negative for dysphoric mood. The patient is not nervous/anxious.        Objective:   Physical Exam Constitutional:  Well developed, no acute distress  HENT:  Nares patent  without discharge  Oropharynx without exudate, palate and uvula are moderately elongated.   Eyes:  Perrla, eomi, no scleral icterus  Neck:  No JVD, no TMG  Cardiovascular:  Normal rate, regular rhythm, no rubs or gallops.  No murmurs        Intact distal pulses  Pulmonary :  Normal breath sounds, no stridor or respiratory distress   No rales, rhonchi, or wheezing  Abdominal:  Soft, nondistended, bowel sounds present.  No tenderness noted.   Musculoskeletal:  trace lower extremity edema noted.  Lymph Nodes:  No cervical lymphadenopathy noted  Skin:  No cyanosis noted  Neurologic:  Alert, appropriate, moves all 4 extremities without obvious deficit.         Assessment & Plan:

## 2011-07-03 ENCOUNTER — Other Ambulatory Visit: Payer: Self-pay | Admitting: Internal Medicine

## 2011-07-04 ENCOUNTER — Telehealth: Payer: Self-pay | Admitting: Internal Medicine

## 2011-07-04 NOTE — Telephone Encounter (Signed)
I left a message of results on the daughter's identified voice mail.

## 2011-07-04 NOTE — Telephone Encounter (Signed)
Pt's dtr calling re lab results from 9-6

## 2011-07-11 ENCOUNTER — Ambulatory Visit (INDEPENDENT_AMBULATORY_CARE_PROVIDER_SITE_OTHER): Payer: Medicare Other | Admitting: Pulmonary Disease

## 2011-07-11 ENCOUNTER — Encounter: Payer: Self-pay | Admitting: Pulmonary Disease

## 2011-07-11 VITALS — BP 118/70 | HR 101 | Temp 97.6°F | Ht 70.0 in | Wt 201.0 lb

## 2011-07-11 DIAGNOSIS — R0609 Other forms of dyspnea: Secondary | ICD-10-CM

## 2011-07-11 DIAGNOSIS — R0600 Dyspnea, unspecified: Secondary | ICD-10-CM

## 2011-07-11 DIAGNOSIS — R0989 Other specified symptoms and signs involving the circulatory and respiratory systems: Secondary | ICD-10-CM

## 2011-07-11 LAB — PULMONARY FUNCTION TEST

## 2011-07-11 NOTE — Progress Notes (Signed)
  Subjective:    Patient ID: Preston Moore, male    DOB: [birth year], 77 y.o.   MRN: [id number]  HPI The patient comes in today for followup of his recent pulmonary function studies.  He has been noted to have no airflow obstruction, no restriction, and only a mild reduction in his diffusion capacity.  I have reviewed this study with him and his family member, and answered all of their questions.   Review of Systems  Constitutional: Negative for fever and unexpected weight change.  HENT: Negative for ear pain, nosebleeds, congestion, sore throat, rhinorrhea, sneezing, trouble swallowing, dental problem, postnasal drip and sinus pressure.   Eyes: Negative for redness and itching.  Respiratory: Negative for cough, chest tightness, shortness of breath and wheezing.   Cardiovascular: Negative for palpitations and leg swelling.  Gastrointestinal: Negative for nausea and vomiting.  Genitourinary: Negative for dysuria.  Musculoskeletal: Negative for joint swelling.  Skin: Negative for rash.  Neurological: Negative for headaches.  Hematological: Does not bruise/bleed easily.  Psychiatric/Behavioral: Negative for dysphoric mood. The patient is not nervous/anxious.        Objective:   Physical Exam Overweight male in no acute distress Nose without purulence or discharge noted Lower extremities with mild edema, no cyanosis Alert and oriented, moves all 4 extremities.       Assessment & Plan:

## 2011-07-11 NOTE — Patient Instructions (Signed)
You do not have copd by your breathing studies, but you still need to quit smoking. followup with me as needed.

## 2011-07-11 NOTE — Progress Notes (Signed)
PFT done today. 

## 2011-07-11 NOTE — Assessment & Plan Note (Signed)
The patient has no obstruction or restriction by his PFTs, and his x-ray this summer was unremarkable.  I see no obvious pulmonary explanation for his episodes of shortness of breath.  His DLCO is mildly reduced, and this may be related to an underlying cardiac issue.  I have stressed to the patient the importance of smoking cessation, and how even with a lack of COPD he can still get airway inflammation and recurrent infections.  I've also asked him to work on weight loss and some type of exercise program.

## 2011-08-08 ENCOUNTER — Encounter: Payer: Self-pay | Admitting: Internal Medicine

## 2011-08-08 ENCOUNTER — Ambulatory Visit (INDEPENDENT_AMBULATORY_CARE_PROVIDER_SITE_OTHER): Payer: Medicare Other | Admitting: Internal Medicine

## 2011-08-08 DIAGNOSIS — I2589 Other forms of chronic ischemic heart disease: Secondary | ICD-10-CM

## 2011-08-08 DIAGNOSIS — I4891 Unspecified atrial fibrillation: Secondary | ICD-10-CM

## 2011-08-08 NOTE — Patient Instructions (Signed)
Your physician wants you to follow-up in: 1 year with Dr. Klein. You will receive a reminder letter in the mail two months in advance. If you don't receive a letter, please call our office to schedule the follow-up appointment.  Your physician recommends that you continue on your current medications as directed. Please refer to the Current Medication list given to you today.  

## 2011-08-08 NOTE — Progress Notes (Signed)
  HPI  Preston Moore is a 77 y.o. male Seen in followup for history of atrial arrhythmias and a thromboembolic risk profile with the CHADS VASC of 4 I met him in the hospital early 2012 after presentation with syncope that was thought to be orthostatic in the setting of dehydration. Evaluation at that time demonstrated normal left ventricular function by echo with an inferior wall motion abnormality and Q waves on his EKG suggestive of an old infarct.Myoview scanning demonstrated ejection fraction 41% with prior infarct in peri-infarct ischemia   He also probable COPD and nocturnal bradycardia and has seen Dr. Shelle Iron who undergo primary motion testing revealing a mild diffusion defect.  The patient denies chest pain, shortness of breath, nocturnal dyspnea, orthopnea or peripheral edema.  There have been no palpitations, lightheadedness or syncope. He says that he'll feel any better he "couldn't stand it". He is resistant to stay with his daughters. His alcohol and cigarette intake is down      Past Medical History  Diagnosis Date  . Atrial flutter   . Diabetes mellitus   . Neuropathy   . Hyperlipidemia   . Syncope and collapse   . Hypertension     Past Surgical History  Procedure Date  . Laminectomy 1965    Current Outpatient Prescriptions  Medication Sig Dispense Refill  . chlorproMAZINE (THORAZINE) 25 MG tablet Take 25 mg by mouth daily.        . Cinnamon 500 MG TABS Take by mouth.        . enalapril (VASOTEC) 2.5 MG tablet take 1 tablet by mouth once daily  30 tablet  0  . furosemide (LASIX) 20 MG tablet Take one tablet by mouth once daily.  30 tablet  6  . gabapentin (NEURONTIN) 300 MG capsule Take 300 mg by mouth at bedtime as needed.       Marland Kitchen HYDROcodone-acetaminophen (VICODIN ES) 7.5-750 MG per tablet Take 1 tablet by mouth 3 (three) times daily.        . insulin aspart (NOVOLOG) 100 UNIT/ML injection Inject 10 Units into the skin as directed.       . metFORMIN (GLUCOPHAGE)  1000 MG tablet Take 1,000 mg by mouth 2 (two) times daily with a meal.        . metoprolol tartrate (LOPRESSOR) 25 MG tablet Take 25 mg by mouth 2 (two) times daily.        . simvastatin (ZOCOR) 20 MG tablet Take 20 mg by mouth daily.        . traZODone (DESYREL) 100 MG tablet Take 100 mg by mouth at bedtime.          No Known Allergies  Review of Systems negative except from HPI and PMH  Physical Exam Well developed and well nourished in no acute distress HENT normal E scleral and icterus clear Neck Supple JVP flat; carotids brisk and full Clear to ausculation Regular rate and rhythm, no murmurs gallops or rub Soft with active bowel sounds No clubbing cyanosis and edema Alert and oriented, grossly normal motor and sensory function Skin Warm and Dry   Assessment and  Plan

## 2011-08-08 NOTE — Assessment & Plan Note (Addendum)
Holding sinus rhythm; Because of recurrent spells we undertook mcot monitor which are only PACs and PVCs

## 2011-08-08 NOTE — Assessment & Plan Note (Signed)
Continue current medications; he is not on aspirin because of GI bleeding

## 2011-12-19 ENCOUNTER — Other Ambulatory Visit: Payer: Self-pay | Admitting: Internal Medicine

## 2011-12-20 NOTE — Telephone Encounter (Signed)
..   Requested Prescriptions   Signed Prescriptions Disp Refills  . furosemide (LASIX) 20 MG tablet 30 tablet 6    Sig: take 1 tablet by mouth once daily    Authorizing Provider: Duke Salvia    Ordering User: Christella Hartigan, Marvin Maenza Judie Petit

## 2012-02-17 ENCOUNTER — Encounter (HOSPITAL_COMMUNITY): Payer: Self-pay | Admitting: Emergency Medicine

## 2012-02-17 ENCOUNTER — Inpatient Hospital Stay (HOSPITAL_COMMUNITY)
Admission: EM | Admit: 2012-02-17 | Discharge: 2012-02-22 | DRG: 682 | Disposition: A | Discharge status: Skilled Nursing Facility | Payer: Medicare Other | Attending: Internal Medicine | Admitting: Internal Medicine

## 2012-02-17 DIAGNOSIS — R5381 Other malaise: Secondary | ICD-10-CM

## 2012-02-17 DIAGNOSIS — F17200 Nicotine dependence, unspecified, uncomplicated: Secondary | ICD-10-CM | POA: Diagnosis present

## 2012-02-17 DIAGNOSIS — N179 Acute kidney failure, unspecified: Principal | ICD-10-CM | POA: Diagnosis present

## 2012-02-17 DIAGNOSIS — D649 Anemia, unspecified: Secondary | ICD-10-CM

## 2012-02-17 DIAGNOSIS — I2589 Other forms of chronic ischemic heart disease: Secondary | ICD-10-CM

## 2012-02-17 DIAGNOSIS — E872 Acidosis, unspecified: Secondary | ICD-10-CM

## 2012-02-17 DIAGNOSIS — R197 Diarrhea, unspecified: Secondary | ICD-10-CM | POA: Diagnosis present

## 2012-02-17 DIAGNOSIS — D509 Iron deficiency anemia, unspecified: Secondary | ICD-10-CM | POA: Diagnosis present

## 2012-02-17 DIAGNOSIS — D72829 Elevated white blood cell count, unspecified: Secondary | ICD-10-CM

## 2012-02-17 DIAGNOSIS — R0600 Dyspnea, unspecified: Secondary | ICD-10-CM

## 2012-02-17 DIAGNOSIS — I4891 Unspecified atrial fibrillation: Secondary | ICD-10-CM

## 2012-02-17 DIAGNOSIS — K219 Gastro-esophageal reflux disease without esophagitis: Secondary | ICD-10-CM

## 2012-02-17 DIAGNOSIS — F1010 Alcohol abuse, uncomplicated: Secondary | ICD-10-CM | POA: Diagnosis present

## 2012-02-17 DIAGNOSIS — G9340 Encephalopathy, unspecified: Secondary | ICD-10-CM | POA: Diagnosis present

## 2012-02-17 DIAGNOSIS — I739 Peripheral vascular disease, unspecified: Secondary | ICD-10-CM

## 2012-02-17 DIAGNOSIS — D518 Other vitamin B12 deficiency anemias: Secondary | ICD-10-CM | POA: Diagnosis present

## 2012-02-17 DIAGNOSIS — E538 Deficiency of other specified B group vitamins: Secondary | ICD-10-CM

## 2012-02-17 DIAGNOSIS — K859 Acute pancreatitis without necrosis or infection, unspecified: Secondary | ICD-10-CM

## 2012-02-17 DIAGNOSIS — R112 Nausea with vomiting, unspecified: Secondary | ICD-10-CM | POA: Diagnosis present

## 2012-02-17 DIAGNOSIS — E875 Hyperkalemia: Secondary | ICD-10-CM | POA: Diagnosis present

## 2012-02-17 MED ORDER — FAMOTIDINE 20 MG PO TABS
20.0000 mg | ORAL_TABLET | Freq: Once | ORAL | Status: AC
  Administered 2012-02-18: 20 mg via ORAL
  Filled 2012-02-17: qty 1

## 2012-02-17 MED ORDER — PANTOPRAZOLE SODIUM 40 MG IV SOLR
40.0000 mg | Freq: Once | INTRAVENOUS | Status: AC
  Administered 2012-02-18: 40 mg via INTRAVENOUS
  Filled 2012-02-17: qty 40

## 2012-02-17 MED ORDER — SODIUM CHLORIDE 0.9 % IV SOLN
INTRAVENOUS | Status: DC
  Administered 2012-02-18: via INTRAVENOUS

## 2012-02-17 MED ORDER — GI COCKTAIL ~~LOC~~
30.0000 mL | Freq: Once | ORAL | Status: AC
  Administered 2012-02-18: 30 mL via ORAL
  Filled 2012-02-17: qty 30

## 2012-02-17 NOTE — ED Notes (Signed)
As per EMS, pt family called EMS stating pt was not eating and was diabetic, CBG was 124 while en route. Family states that alcohol consumption increased and has decreased in other forms of fluids today.

## 2012-02-17 NOTE — ED Notes (Signed)
MD at bedside. 

## 2012-02-17 NOTE — ED Notes (Signed)
ZOX:[id number]<AV> Expected date:02/17/12<BR> Expected time: 9:58 PM<BR> Means of arrival:Ambulance<BR> Comments:<BR> Pt32. 81 m. Decreased PO intake, weak. 30 mins

## 2012-02-18 ENCOUNTER — Encounter (HOSPITAL_COMMUNITY): Payer: Self-pay | Admitting: *Deleted

## 2012-02-18 ENCOUNTER — Inpatient Hospital Stay (HOSPITAL_COMMUNITY): Payer: Medicare Other

## 2012-02-18 ENCOUNTER — Emergency Department (HOSPITAL_COMMUNITY): Payer: Medicare Other

## 2012-02-18 DIAGNOSIS — D72829 Elevated white blood cell count, unspecified: Secondary | ICD-10-CM | POA: Diagnosis present

## 2012-02-18 DIAGNOSIS — E875 Hyperkalemia: Secondary | ICD-10-CM

## 2012-02-18 DIAGNOSIS — D509 Iron deficiency anemia, unspecified: Secondary | ICD-10-CM | POA: Diagnosis present

## 2012-02-18 DIAGNOSIS — R112 Nausea with vomiting, unspecified: Secondary | ICD-10-CM

## 2012-02-18 DIAGNOSIS — E872 Acidosis: Secondary | ICD-10-CM

## 2012-02-18 DIAGNOSIS — N179 Acute kidney failure, unspecified: Secondary | ICD-10-CM

## 2012-02-18 DIAGNOSIS — A088 Other specified intestinal infections: Secondary | ICD-10-CM

## 2012-02-18 DIAGNOSIS — R197 Diarrhea, unspecified: Secondary | ICD-10-CM | POA: Diagnosis present

## 2012-02-18 LAB — CBC
Hemoglobin: 10.5 g/dL — ABNORMAL LOW (ref 13.0–17.0)
MCH: 33.7 pg (ref 26.0–34.0)
MCHC: 35.4 g/dL (ref 30.0–36.0)
MCHC: 35.9 g/dL (ref 30.0–36.0)
MCV: 94.3 fL (ref 78.0–100.0)
MCV: 95.2 fL (ref 78.0–100.0)
Platelets: 244 10*3/uL (ref 150–400)
RBC: 3.12 MIL/uL — ABNORMAL LOW (ref 4.22–5.81)
RDW: 13.9 % (ref 11.5–15.5)
WBC: 10.3 10*3/uL (ref 4.0–10.5)

## 2012-02-18 LAB — POTASSIUM: Potassium: 5.9 mEq/L — ABNORMAL HIGH (ref 3.5–5.1)

## 2012-02-18 LAB — BASIC METABOLIC PANEL
BUN: 72 mg/dL — ABNORMAL HIGH (ref 6–23)
CO2: 22 mEq/L (ref 19–32)
Calcium: 10 mg/dL (ref 8.4–10.5)
Creatinine, Ser: 3.78 mg/dL — ABNORMAL HIGH (ref 0.50–1.35)
Glucose, Bld: 101 mg/dL — ABNORMAL HIGH (ref 70–99)

## 2012-02-18 LAB — URINALYSIS, ROUTINE W REFLEX MICROSCOPIC
Leukocytes, UA: NEGATIVE
Nitrite: NEGATIVE
Specific Gravity, Urine: 1.022 (ref 1.005–1.030)
Urobilinogen, UA: 0.2 mg/dL (ref 0.0–1.0)

## 2012-02-18 LAB — URINE MICROSCOPIC-ADD ON

## 2012-02-18 LAB — COMPREHENSIVE METABOLIC PANEL
CO2: 20 mEq/L (ref 19–32)
Calcium: 10.5 mg/dL (ref 8.4–10.5)
Creatinine, Ser: 3.84 mg/dL — ABNORMAL HIGH (ref 0.50–1.35)
GFR calc Af Amer: 16 mL/min — ABNORMAL LOW (ref >90)
GFR calc non Af Amer: 14 mL/min — ABNORMAL LOW (ref >90)
Glucose, Bld: 128 mg/dL — ABNORMAL HIGH (ref 70–99)

## 2012-02-18 LAB — POCT I-STAT, CHEM 8
BUN: 68 mg/dL — ABNORMAL HIGH (ref 6–23)
Chloride: 91 mEq/L — ABNORMAL LOW (ref 96–112)
Creatinine, Ser: 3.8 mg/dL — ABNORMAL HIGH (ref 0.50–1.35)
Sodium: 122 mEq/L — ABNORMAL LOW (ref 135–145)
TCO2: 21 mmol/L (ref 0–100)

## 2012-02-18 LAB — LACTIC ACID, PLASMA: Lactic Acid, Venous: 11.3 mmol/L — ABNORMAL HIGH (ref 0.5–2.2)

## 2012-02-18 LAB — ETHANOL: Alcohol, Ethyl (B): <11 mg/dL (ref 0–11)

## 2012-02-18 MED ORDER — INSULIN ASPART 100 UNIT/ML ~~LOC~~ SOLN
0.0000 [IU] | Freq: Every day | SUBCUTANEOUS | Status: DC
  Administered 2012-02-20: 2 [IU] via SUBCUTANEOUS

## 2012-02-18 MED ORDER — LORAZEPAM 1 MG PO TABS: 1.0000 mg | ORAL_TABLET | Freq: Four times a day (QID) | ORAL | Status: AC | PRN

## 2012-02-18 MED ORDER — OXYCODONE HCL 5 MG PO TABS
5.0000 mg | ORAL_TABLET | ORAL | Status: DC | PRN
  Administered 2012-02-21: 5 mg via ORAL
  Filled 2012-02-18: qty 1

## 2012-02-18 MED ORDER — SODIUM CHLORIDE 0.9 % IJ SOLN
3.0000 mL | Freq: Two times a day (BID) | INTRAMUSCULAR | Status: DC
  Administered 2012-02-21: 3 mL via INTRAVENOUS

## 2012-02-18 MED ORDER — ENOXAPARIN SODIUM 40 MG/0.4ML ~~LOC~~ SOLN
40.0000 mg | SUBCUTANEOUS | Status: DC
  Administered 2012-02-18 – 2012-02-22 (×5): 40 mg via SUBCUTANEOUS
  Filled 2012-02-18 (×6): qty 0.4

## 2012-02-18 MED ORDER — ACETAMINOPHEN 325 MG PO TABS: 650.0000 mg | ORAL_TABLET | Freq: Four times a day (QID) | ORAL | Status: DC | PRN

## 2012-02-18 MED ORDER — PANTOPRAZOLE SODIUM 40 MG IV SOLR
40.0000 mg | INTRAVENOUS | Status: DC
  Administered 2012-02-18 – 2012-02-20 (×3): 40 mg via INTRAVENOUS
  Filled 2012-02-18 (×4): qty 40

## 2012-02-18 MED ORDER — INSULIN ASPART PROT & ASPART (70-30 MIX) 100 UNIT/ML ~~LOC~~ SUSP
5.0000 [IU] | Freq: Once | SUBCUTANEOUS | Status: AC
  Administered 2012-02-18: 5 [IU] via SUBCUTANEOUS
  Filled 2012-02-18: qty 3

## 2012-02-18 MED ORDER — HYDROMORPHONE HCL PF 1 MG/ML IJ SOLN: 0.5000 mg | INTRAMUSCULAR | Status: DC | PRN

## 2012-02-18 MED ORDER — CALCIUM CHLORIDE 10 % IV SOLN: 1.0000 g | Freq: Once | INTRAVENOUS | Status: DC

## 2012-02-18 MED ORDER — ACETAMINOPHEN 650 MG RE SUPP: 650.0000 mg | Freq: Four times a day (QID) | RECTAL | Status: DC | PRN

## 2012-02-18 MED ORDER — ONDANSETRON HCL 4 MG PO TABS: 4.0000 mg | ORAL_TABLET | Freq: Four times a day (QID) | ORAL | Status: DC | PRN

## 2012-02-18 MED ORDER — ONDANSETRON HCL 4 MG/2ML IJ SOLN
4.0000 mg | Freq: Four times a day (QID) | INTRAMUSCULAR | Status: DC | PRN
  Administered 2012-02-18: 4 mg via INTRAVENOUS
  Filled 2012-02-18: qty 2

## 2012-02-18 MED ORDER — SODIUM CHLORIDE 0.9 % IV SOLN
INTRAVENOUS | Status: DC
  Administered 2012-02-18 – 2012-02-20 (×5): via INTRAVENOUS

## 2012-02-18 MED ORDER — LORAZEPAM 2 MG/ML IJ SOLN: 1.0000 mg | Freq: Four times a day (QID) | INTRAMUSCULAR | Status: AC | PRN

## 2012-02-18 MED ORDER — ADULT MULTIVITAMIN W/MINERALS CH
1.0000 | ORAL_TABLET | Freq: Every day | ORAL | Status: DC
  Administered 2012-02-20 – 2012-02-22 (×3): 1 via ORAL
  Filled 2012-02-18 (×5): qty 1

## 2012-02-18 MED ORDER — FOLIC ACID 1 MG PO TABS
1.0000 mg | ORAL_TABLET | Freq: Every day | ORAL | Status: DC
  Administered 2012-02-20 – 2012-02-22 (×3): 1 mg via ORAL
  Filled 2012-02-18 (×5): qty 1

## 2012-02-18 MED ORDER — VITAMIN B-1 100 MG PO TABS
100.0000 mg | ORAL_TABLET | Freq: Every day | ORAL | Status: DC
  Administered 2012-02-20 – 2012-02-22 (×3): 100 mg via ORAL
  Filled 2012-02-18 (×5): qty 1

## 2012-02-18 MED ORDER — INSULIN ASPART 100 UNIT/ML ~~LOC~~ SOLN
0.0000 [IU] | Freq: Three times a day (TID) | SUBCUTANEOUS | Status: DC
  Administered 2012-02-19: 1 [IU] via SUBCUTANEOUS
  Administered 2012-02-20: 3 [IU] via SUBCUTANEOUS
  Administered 2012-02-20: 1 [IU] via SUBCUTANEOUS
  Administered 2012-02-20 – 2012-02-21 (×2): 3 [IU] via SUBCUTANEOUS
  Administered 2012-02-21 – 2012-02-22 (×3): 2 [IU] via SUBCUTANEOUS
  Administered 2012-02-22: 5 [IU] via SUBCUTANEOUS

## 2012-02-18 MED ORDER — ALUM & MAG HYDROXIDE-SIMETH 200-200-20 MG/5ML PO SUSP
30.0000 mL | Freq: Once | ORAL | Status: AC
  Administered 2012-02-18: 30 mL via ORAL
  Filled 2012-02-18: qty 30

## 2012-02-18 MED ORDER — DEXTROSE 50 % IV SOLN
50.0000 mL | Freq: Once | INTRAVENOUS | Status: AC
  Administered 2012-02-18: 50 mL via INTRAVENOUS
  Filled 2012-02-18: qty 50

## 2012-02-18 MED ORDER — THIAMINE HCL 100 MG/ML IJ SOLN
100.0000 mg | Freq: Every day | INTRAMUSCULAR | Status: DC
  Administered 2012-02-18 – 2012-02-19 (×2): 100 mg via INTRAVENOUS
  Filled 2012-02-18 (×5): qty 1

## 2012-02-18 MED ORDER — SODIUM POLYSTYRENE SULFONATE 15 GM/60ML PO SUSP
30.0000 g | Freq: Once | ORAL | Status: AC
  Administered 2012-02-18: 30 g via ORAL
  Filled 2012-02-18: qty 120

## 2012-02-18 MED ORDER — SODIUM CHLORIDE 0.9 % IV SOLN
1.0000 g | Freq: Once | INTRAVENOUS | Status: AC
  Administered 2012-02-18: 1 g via INTRAVENOUS
  Filled 2012-02-18: qty 10

## 2012-02-18 MED ORDER — ZOLPIDEM TARTRATE 5 MG PO TABS
5.0000 mg | ORAL_TABLET | Freq: Every evening | ORAL | Status: DC | PRN
  Administered 2012-02-21: 5 mg via ORAL
  Filled 2012-02-18: qty 1

## 2012-02-18 NOTE — ED Provider Notes (Addendum)
History     CSN: [id number]  Arrival date & time 02/17/12  2227   First MD Initiated Contact with Patient 02/17/12 2309      Chief Complaint  Patient presents with  . Alcohol Intoxication  . Failure To Thrive    (Consider location/radiation/quality/duration/timing/severity/associated sxs/prior treatment) HPI History provided by patient and family bedside. Unable to eat anything for last few days with recent nausea vomiting diarrhea resolved. Last emesis and diarrhea yesterday. Now complains of severe heartburn with belching and feels like he has acid in his throat. Unable to tolerating by mouth. No fevers. No rashes. No recent change in medications. Taking medications as prescribed including Lasix. No chest pain or shortness of breath. Decreased urination noted over the last few days.   moderate in severity. No known aggravating or alleviating factors otherwise.  Past Medical History  Diagnosis Date  . Atrial flutter   . Diabetes mellitus   . Neuropathy   . Hyperlipidemia   . Syncope and collapse   . Hypertension     Past Surgical History  Procedure Date  . Laminectomy 1965    Family History  Problem Relation Age of Onset  . Hypertension Neg Hx   . Coronary artery disease Neg Hx   . Diabetes Neg Hx     History  Substance Use Topics  . Smoking status: Current Everyday Smoker -- 0.5 packs/day for 10 years    Types: Cigarettes  . Smokeless tobacco: Never Used  . Alcohol Use: 21.6 oz/week    36 Cans of beer per week      Review of Systems  Constitutional: Negative for fever and chills.  HENT: Negative for neck pain and neck stiffness.   Eyes: Negative for pain.  Respiratory: Negative for shortness of breath.   Cardiovascular: Negative for chest pain.  Gastrointestinal: Positive for nausea, vomiting and diarrhea. Negative for abdominal pain and blood in stool.  Genitourinary: Negative for dysuria.  Musculoskeletal: Negative for back pain.  Skin: Negative for  rash.  Neurological: Negative for headaches.  All other systems reviewed and are negative.    Allergies  Review of patient's allergies indicates no known allergies.  Home Medications   Current Outpatient Rx  Name Route Sig Dispense Refill  . CINNAMON 500 MG PO TABS Oral Take 1 tablet by mouth every morning.     . ENALAPRIL MALEATE 2.5 MG PO TABS  take 1 tablet by mouth once daily 30 tablet 0  . FUROSEMIDE 20 MG PO TABS  take 1 tablet by mouth once daily 30 tablet 6  . GABAPENTIN 300 MG PO CAPS Oral Take 300 mg by mouth at bedtime.     Marland Kitchen HYDROCODONE-ACETAMINOPHEN 7.5-750 MG PO TABS Oral Take 1 tablet by mouth 2 (two) times daily.     . INSULIN ASPART 100 UNIT/ML [place] SOLN Subcutaneous Inject 10 Units into the skin daily as needed. For increased sugar level over 150    . INSULIN GLARGINE 100 UNIT/ML [place] SOLN Subcutaneous Inject 10 Units into the skin at bedtime.    Marland Kitchen METFORMIN HCL 1000 MG PO TABS Oral Take 1,000 mg by mouth 2 (two) times daily with a meal.      . METOPROLOL TARTRATE 25 MG PO TABS Oral Take 25 mg by mouth 2 (two) times daily.      Marland Kitchen SIMVASTATIN 20 MG PO TABS Oral Take 20 mg by mouth daily.      . TRAZODONE HCL 100 MG PO TABS Oral Take 300 mg  by mouth at bedtime.       BP 111/51  Pulse 82  Temp(Src) 98 F (36.7 C) (Oral)  Resp 17  Ht 5\' 10"  (1.778 m)  Wt 200 lb (90.719 kg)  BMI 28.70 kg/m2  SpO2 100%  Physical Exam  Constitutional: He is oriented to person, place, and time. He appears well-developed and well-nourished.  HENT:  Head: Normocephalic and atraumatic.       dRy mucous membranes  Eyes: Conjunctivae and EOM are normal. Pupils are equal, round, and reactive to light. No scleral icterus.  Neck: Trachea normal. Neck supple. No thyromegaly present.  Cardiovascular: Normal rate, regular rhythm, S1 normal, S2 normal and normal pulses.     No systolic murmur is present   No diastolic murmur is present  Pulses:      Radial pulses are 2+ on the right side,  and 2+ on the left side.  Pulmonary/Chest: Effort normal and breath sounds normal. He has no wheezes. He has no rhonchi. He has no rales. He exhibits no tenderness.  Abdominal: Soft. Normal appearance and bowel sounds are normal. There is no tenderness. There is no CVA tenderness and negative Murphy's sign.  Musculoskeletal:       BLE:s Calves nontender, no cords or erythema, negative Homans sign  Neurological: He is alert and oriented to person, place, and time. He has normal strength. No cranial nerve deficit or sensory deficit. GCS eye subscore is 4. GCS verbal subscore is 5. GCS motor subscore is 6.  Skin: Skin is warm and dry. No rash noted. He is not diaphoretic.  Psychiatric: His speech is normal.       Cooperative and appropriate    ED Course  Procedures (including critical care time)  Labs Reviewed  CBC - Abnormal; Notable for the following:    WBC 14.0 (*)    RBC 3.12 (*)    Hemoglobin 10.5 (*)    HCT 29.7 (*)    All other components within normal limits  COMPREHENSIVE METABOLIC PANEL - Abnormal; Notable for the following:    Sodium 124 (*)    Potassium 6.3 (*)    Chloride 79 (*)    Glucose, Bld 128 (*)    BUN 67 (*)    Creatinine, Ser 3.84 (*)    Total Protein 5.7 (*)    Albumin 2.9 (*)    AST 38 (*)    Total Bilirubin 0.2 (*)    GFR calc non Af Amer 14 (*)    GFR calc Af Amer 16 (*)    All other components within normal limits  URINALYSIS, ROUTINE W REFLEX MICROSCOPIC - Abnormal; Notable for the following:    APPearance CLOUDY (*)    Hgb urine dipstick SMALL (*)    Ketones, ur TRACE (*)    All other components within normal limits  POCT I-STAT, CHEM 8 - Abnormal; Notable for the following:    Sodium 122 (*)    Potassium 6.1 (*)    Chloride 91 (*)    BUN 68 (*)    Creatinine, Ser 3.80 (*)    Glucose, Bld 123 (*)    Hemoglobin 10.2 (*)    HCT 30.0 (*)    All other components within normal limits  LACTIC ACID, PLASMA - Abnormal; Notable for the following:      Lactic Acid, Venous 11.3 (*)    All other components within normal limits  POTASSIUM - Abnormal; Notable for the following:    Potassium 5.9 (*)  All other components within normal limits  ETHANOL  URINE MICROSCOPIC-ADD ON   Dg Chest 1 View  02/18/2012  *RADIOLOGY REPORT*  Clinical Data: Shortness of breath and weakness; failure to thrive.  CHEST - 1 VIEW  Comparison: Chest radiograph performed 03/28/2011  Findings: The lungs are well-aerated.  Minimal left basilar opacity may reflect atelectasis or possibly pneumonia.  There is no evidence of pleural effusion or pneumothorax.  Mild vascular congestion is noted.  The cardiomediastinal silhouette is borderline normal in size; calcification is seen within the aortic arch.  No acute osseous abnormalities are seen.  IMPRESSION: Mild vascular congestion noted, without significant pulmonary edema.  Minimal left basilar opacity may reflect atelectasis or possibly pneumonia.  Original Report Authenticated By: Tonia Ghent, M.D.    Date: 02/18/2012  Rate: 83  Rhythm: normal sinus rhythm  QRS Axis: normal  Intervals: normal  ST/T Wave abnormalities: nonspecific ST changes  Conduction Disutrbances:none  Narrative Interpretation:   Old EKG Reviewed: none available    1. Lactic acidosis   2. Acute renal failure   3. Hyperkalemia    CRITICAL CARE Performed by: Sunnie Nielsen   Total critical care time: 40  Critical care time was exclusive of separately billable procedures and treating other patients.  Critical care was necessary to treat or prevent imminent or life-threatening deterioration.  Critical care was time spent personally by me on the following activities: development of treatment plan with patient and/or surrogate as well as nursing, discussions with consultants, evaluation of patient's response to treatment, examination of patient, obtaining history from patient or surrogate, ordering and performing treatments and interventions,  ordering and review of laboratory studies, ordering and review of radiographic studies, pulse oximetry and re-evaluation of patient's condition. Hyperkalemia with multiple IV medications and IV fluids for dehydration. [facility] consultation with admission   2:28 AM case discussed with Dr. Lovell Sheehan on call for triad will admit. Hyperkalemia treatment initiated as above. Lactate noted. Patient remains hemodynamically stable. Knee and IV fluids and add Kayexalate.   MDM   Dehydration/ lactic acidosis with hyperkalemia in the setting of acute renal failure. IV fluids, calcium, insulin, Kayexalate and medical admission.         Sunnie Nielsen, MD 02/18/12 1027  Sunnie Nielsen, MD 02/18/12 [phone]

## 2012-02-18 NOTE — ED Notes (Signed)
MD at bedside. 

## 2012-02-18 NOTE — Progress Notes (Addendum)
Subjective:   Chart reviewed. No further N/V or BM's since admission. Complains of some intermittent epigastric abdominal pain. Says drinks beers occasionally. No CP/SOB.  Objective  Vital signs in last 24 hours: Filed Vitals:   02/18/12 0245 02/18/12 0315 02/18/12 0427 02/18/12 1250  BP: 112/61 109/95 97/57 117/68  Pulse: 81 84 91 60  Temp:   98 F (36.7 C) 98.6 F (37 C)  TempSrc:   Oral Oral  Resp: 15 16 18 22   Height:   5\' 10"  (1.778 m)   Weight:   90.719 kg (200 lb)   SpO2: 100% 95% 97% 99%   Weight change:   Intake/Output Summary (Last 24 hours) at 02/18/12 1424 Last data filed at 02/18/12 1100  Gross per 24 hour  Intake 215.75 ml  Output    600 ml  Net -384.25 ml    Physical Exam:  General Exam: Comfortable.  Respiratory System: Clear. No increased work of breathing.  Cardiovascular System: First and second heart sounds heard. Regular rate and rhythm. No JVD/murmurs. Telemetry: SR 70-80. Gastrointestinal System: Abdomen is non distended, soft and normal bowel sounds heard. ? Mild epigastric tenderness. No R/G/R. Central Nervous System: Somnolent but arousable, oriented to self and place. No focal neurological deficits. Extremities: symmetric 5/5 power. 1-2+ bipedal pitting edema.  Labs:  Basic Metabolic Panel:  Lab 02/18/12 6644 02/18/12 0145 02/18/12 0034 02/17/12 2342  NA 125* -- 122* 124*  K 5.2* 5.9* 6.1* --  CL 85* -- 91* 79*  CO2 22 -- -- 20  GLUCOSE 101* -- 123* 128*  BUN 72* -- 68* 67*  CREATININE 3.78* -- 3.80* 3.84*  CALCIUM 10.0 -- -- 10.5  ALB -- -- -- --  PHOS -- -- -- --   Liver Function Tests:  Lab 02/17/12 2342  AST 38*  ALT 20  ALKPHOS 53  BILITOT 0.2*  PROT 5.7*  ALBUMIN 2.9*   No results found for this basename: LIPASE:3,AMYLASE:3 in the last 168 hours No results found for this basename: AMMONIA:3 in the last 168 hours CBC:  Lab 02/18/12 0929 02/18/12 0034 02/17/12 2342  WBC 10.3 -- 14.0*  NEUTROABS -- -- --  HGB 8.3*  10.2* 10.5*  HCT 23.1* 30.0* 29.7*  MCV 94.3 -- 95.2  PLT 244 -- 300   Cardiac Enzymes: No results found for this basename: CKTOTAL:5,CKMB:5,CKMBINDEX:5,TROPONINI:5 in the last 168 hours CBG: No results found for this basename: GLUCAP:5 in the last 168 hours  Iron Studies: No results found for this basename: IRON,TIBC,TRANSFERRIN,FERRITIN in the last 72 hours Studies/Results: Dg Chest 1 View  02/18/2012  *RADIOLOGY REPORT*  Clinical Data: Shortness of breath and weakness; failure to thrive.  CHEST - 1 VIEW  Comparison: Chest radiograph performed 03/28/2011  Findings: The lungs are well-aerated.  Minimal left basilar opacity may reflect atelectasis or possibly pneumonia.  There is no evidence of pleural effusion or pneumothorax.  Mild vascular congestion is noted.  The cardiomediastinal silhouette is borderline normal in size; calcification is seen within the aortic arch.  No acute osseous abnormalities are seen.  IMPRESSION: Mild vascular congestion noted, without significant pulmonary edema.  Minimal left basilar opacity may reflect atelectasis or possibly pneumonia.  Original Report Authenticated By: Tonia Ghent, M.D.   Medications:    . sodium chloride 125 mL/hr at 02/18/12 1209  . DISCONTD: sodium chloride 125 mL/hr at 02/18/12 0025      . alum & mag hydroxide-simeth  30 mL Oral Once  . calcium gluconate  1 g Intravenous  Once  . dextrose  50 mL Intravenous Once  . enoxaparin  40 mg Subcutaneous Q24H  . famotidine  20 mg Oral Once  . folic acid  1 mg Oral Daily  . gi cocktail  30 mL Oral Once  . insulin aspart protamine-insulin aspart  5 Units Subcutaneous Once  . mulitivitamin with minerals  1 tablet Oral Daily  . pantoprazole (PROTONIX) IV  40 mg Intravenous Once  . sodium chloride  3 mL Intravenous Q12H  . sodium polystyrene  30 g Oral Once  . thiamine  100 mg Oral Daily   Or  . thiamine  100 mg Intravenous Daily  . DISCONTD: calcium chloride  1 g Intravenous Once    I   have reviewed scheduled and prn medications.     Problem/Plan: Principal Problem:  *ARF (acute renal failure) Active Problems:  Acute renal failure  Hyperkalemia  Nausea and vomiting  Diarrhea  Leukocytosis  Anemia  Lactic acidosis  1. Nausea, vomiting and diarrhea: no sickly contacts/recent antibiotic exposure. ? Acute viral GE. Check stool CDiff PCR. Advance diet as tolerated. 2. Hyponatremic dehydration 2/2 # 1: IVF and monitor. Hold diuretics. 3. Acute renal Failure: creat in sept 2012 was 0.9. Probably multifactorial- dehydration, diuretics, ACEI and possible ATN from hypotension. Hold culprit meds. IVF. Check renal US. F/U daily BMP. I/O's. 4. Hyperkalemia: 2/2 toARF and ACEI. Improving. Mx as above. F/U BMP in am. 5. Anemia: ? Etiology. No overt bleeding. Check FOBT. F/U CBC in am. Transfuse if Hb < 7 gm/dl. 6. DM 2: SSI. Hold Metformin. 7. HTN: controlled. 8. Painful swallowing: ? Prior h/o esophageal erosion: PPI. Will consult Gastroenterology.    Discussed with patients daughter Ms. Bebe Liter via phone and updated care and answered questions.  Lipase 599. ? Significance ?Pancreatitis. LFT's unremarkable. Abdominal exam unremarkable. Monitor.  Kyvon Hu 02/18/2012,2:24 PM  LOS: 1 day

## 2012-02-18 NOTE — H&P (Addendum)
DATE OF ADMISSION:  02/18/2012  PCP:   Berlinda Last, DO   Chief Complaint: Weakness   HPI: Preston Moore is an 78 y.o. male who presents to the ED with complaints of severe weakness after 3-4 days of Nausea, vomiting and diarrhea.  He also has complaints of burning esophageal pain from his vomiting.   He denies any fevers or chills, and he denies having any bloody stools. He reports not being able to hold down any foods or liquids.  In the ED,  his laboratory studies revealed multiple abnormalities, with an elevated bun/cr of /713.5 and his creatinine had previously been 0.9.  His potassium was also elevated at 6.3 and on recheck was 5.9 so treatment was initiated in the ED for the hyperkalemia.    Past Medical History  Diagnosis Date  . Atrial flutter   . Diabetes mellitus   . Neuropathy   . Hyperlipidemia   . Syncope and collapse   . Hypertension     Past Surgical History  Procedure Date  . Laminectomy 1965    Medications:  HOME MEDS: Prior to Admission medications   Medication Sig Start Date End Date Taking? Authorizing Provider  Cinnamon 500 MG TABS Take 1 tablet by mouth every morning.    Yes Historical Provider, MD  enalapril (VASOTEC) 2.5 MG tablet take 1 tablet by mouth once daily 07/03/11  Yes Duke Salvia, MD  furosemide (LASIX) 20 MG tablet take 1 tablet by mouth once daily 12/19/11  Yes Duke Salvia, MD  gabapentin (NEURONTIN) 300 MG capsule Take 300 mg by mouth at bedtime.    Yes Historical Provider, MD  HYDROcodone-acetaminophen (VICODIN ES) 7.5-750 MG per tablet Take 1 tablet by mouth 2 (two) times daily.    Yes Historical Provider, MD  insulin aspart (NOVOLOG) 100 UNIT/ML injection Inject 10 Units into the skin daily as needed. For increased sugar level over 150   Yes Historical Provider, MD  insulin glargine (LANTUS) 100 UNIT/ML injection Inject 10 Units into the skin at bedtime.   Yes Historical Provider, MD  metFORMIN (GLUCOPHAGE) 1000 MG tablet Take  1,000 mg by mouth 2 (two) times daily with a meal.     Yes Historical Provider, MD  metoprolol tartrate (LOPRESSOR) 25 MG tablet Take 25 mg by mouth 2 (two) times daily.     Yes Historical Provider, MD  simvastatin (ZOCOR) 20 MG tablet Take 20 mg by mouth daily.     Yes Historical Provider, MD  traZODone (DESYREL) 100 MG tablet Take 300 mg by mouth at bedtime.    Yes Historical Provider, MD    Allergies:  No Known Allergies  Social History: He reports that he drinks 1/2 gallon of Alcohol a week.    reports that he has been smoking Cigarettes.  He has a 5 pack-year smoking history. He has never used smokeless tobacco. He reports that he drinks about 21.6 ounces of alcohol per week. He reports that he does not use illicit drugs.  Family History: Family History  Problem Relation Age of Onset  . Hypertension Neg Hx   . Coronary artery disease Neg Hx   . Diabetes Neg Hx     Review of Systems:  severe indigestion/heartburn,  muscle weakness, anorexia, The patient denies fever, weight loss, vision loss, decreased hearing, hoarseness, chest pain, syncope, dyspnea on exertion, peripheral edema, balance deficits, hemoptysis, abdominal pain, melena, hematochezia,, hematuria, incontinence, genital sores, suspicious skin lesions, transient blindness, difficulty walking, depression, unusual weight change, abnormal  bleeding, enlarged lymph nodes, angioedema, and breast masses.   Physical Examination:    GEN:  Pleasant  78 year old well nourished well developed Elderly Caucasian male examined  and in no acute distress; cooperative with exam Filed Vitals:   02/18/12 0230 02/18/12 0245 02/18/12 0315 02/18/12 0427  BP: 114/54 112/61 109/95 97/57  Pulse: 83 81 84 91  Temp:    98 F (36.7 C)  TempSrc:    Oral  Resp: 18 15 16 18   Height:    5\' 10"  (1.778 m)  Weight:    90.719 kg (200 lb)  SpO2: 99% 100% 95% 97%   Blood pressure 97/57, pulse 91, temperature 98 F (36.7 C), temperature source Oral,  resp. rate 18, height 5\' 10"  (1.778 m), weight 90.719 kg (200 lb), SpO2 97.00%. PSYCH: He is alert and oriented x4; does not appear anxious does not appear depressed; affect is normal HEENT: Normocephalic and Atraumatic, Mucous membranes pink; PERRLA; EOM intact; Fundi:  Benign;  No scleral icterus, Nares: Patent, Oropharynx: Clear, Neck:  FROM, no cervical lymphadenopathy nor thyromegaly or carotid bruit; no JVD; Breasts:: Not examined CHEST WALL: No tenderness CHEST: Normal respiration, clear to auscultation bilaterally HEART: Regular rate and rhythm; no murmurs rubs or gallops BACK: No kyphosis or scoliosis; no CVA tenderness ABDOMEN: Positive Bowel Sounds, soft non-tender; no masses, no organomegaly.   Rectal Exam: Not done EXTREMITIES: No bone or joint deformity; age-appropriate arthropathy of the hands and knees; no cyanosis, clubbing or edema; no ulcerations. Genitalia: not examined PULSES: 2+ and symmetric SKIN: Normal hydration no rash or ulceration CNS: Cranial nerves 2-12 grossly intact no focal neurologic deficit   Labs & Imaging Results for orders placed during the hospital encounter of 02/17/12 (from the past 48 hour(s))  CBC     Status: Abnormal   Collection Time   02/17/12 11:42 PM      Component Value Range Comment   WBC 14.0 (*) 4.0 - 10.5 (K/uL)    RBC 3.12 (*) 4.22 - 5.81 (MIL/uL)    Hemoglobin 10.5 (*) 13.0 - 17.0 (g/dL)    HCT 14.7 (*) 82.9 - 52.0 (%)    MCV 95.2  78.0 - 100.0 (fL)    MCH 33.7  26.0 - 34.0 (pg)    MCHC 35.4  30.0 - 36.0 (g/dL)    RDW 56.2  13.0 - 86.5 (%)    Platelets 300  150 - 400 (K/uL)   COMPREHENSIVE METABOLIC PANEL     Status: Abnormal   Collection Time   02/17/12 11:42 PM      Component Value Range Comment   Sodium 124 (*) 135 - 145 (mEq/L)    Potassium 6.3 (*) 3.5 - 5.1 (mEq/L)    Chloride 79 (*) 96 - 112 (mEq/L)    CO2 20  19 - 32 (mEq/L)    Glucose, Bld 128 (*) 70 - 99 (mg/dL)    BUN 67 (*) 6 - 23 (mg/dL)    Creatinine, Ser 7.84  (*) 0.50 - 1.35 (mg/dL)    Calcium 69.6  8.4 - 10.5 (mg/dL)    Total Protein 5.7 (*) 6.0 - 8.3 (g/dL)    Albumin 2.9 (*) 3.5 - 5.2 (g/dL)    AST 38 (*) 0 - 37 (U/L)    ALT 20  0 - 53 (U/L)    Alkaline Phosphatase 53  39 - 117 (U/L)    Total Bilirubin 0.2 (*) 0.3 - 1.2 (mg/dL)    GFR calc non Af [place]  14 (*) >90 (mL/min)    GFR calc Af Amer 16 (*) >90 (mL/min)   ETHANOL     Status: Normal   Collection Time   02/17/12 11:42 PM      Component Value Range Comment   Alcohol, Ethyl (B) <11  0 - 11 (mg/dL)   URINALYSIS, ROUTINE W REFLEX MICROSCOPIC     Status: Abnormal   Collection Time   02/18/12 12:17 AM      Component Value Range Comment   Color, Urine YELLOW  YELLOW     APPearance CLOUDY (*) CLEAR     Specific Gravity, Urine 1.022  1.005 - 1.030     pH 6.0  5.0 - 8.0     Glucose, UA NEGATIVE  NEGATIVE (mg/dL)    Hgb urine dipstick SMALL (*) NEGATIVE     Bilirubin Urine NEGATIVE  NEGATIVE     Ketones, ur TRACE (*) NEGATIVE (mg/dL)    Protein, ur NEGATIVE  NEGATIVE (mg/dL)    Urobilinogen, UA 0.2  0.0 - 1.0 (mg/dL)    Nitrite NEGATIVE  NEGATIVE     Leukocytes, UA NEGATIVE  NEGATIVE    URINE MICROSCOPIC-ADD ON     Status: Normal   Collection Time   02/18/12 12:17 AM      Component Value Range Comment   WBC, UA 0-2  <3 (WBC/hpf)    RBC / HPF 3-6  <3 (RBC/hpf)   POCT I-STAT, CHEM 8     Status: Abnormal   Collection Time   02/18/12 12:34 AM      Component Value Range Comment   Sodium 122 (*) 135 - 145 (mEq/L)    Potassium 6.1 (*) 3.5 - 5.1 (mEq/L)    Chloride 91 (*) 96 - 112 (mEq/L)    BUN 68 (*) 6 - 23 (mg/dL)    Creatinine, Ser 1.61 (*) 0.50 - 1.35 (mg/dL)    Glucose, Bld 096 (*) 70 - 99 (mg/dL)    Calcium, Ion 0.45  1.12 - 1.32 (mmol/L)    TCO2 21  0 - 100 (mmol/L)    Hemoglobin 10.2 (*) 13.0 - 17.0 (g/dL)    HCT 40.9 (*) 81.1 - 52.0 (%)   LACTIC ACID, PLASMA     Status: Abnormal   Collection Time   02/18/12  1:45 AM      Component Value Range Comment   Lactic Acid, Venous 11.3  (*) 0.5 - 2.2 (mmol/L)   POTASSIUM     Status: Abnormal   Collection Time   02/18/12  1:45 AM      Component Value Range Comment   Potassium 5.9 (*) 3.5 - 5.1 (mEq/L)    Dg Chest 1 View  02/18/2012  *RADIOLOGY REPORT*  Clinical Data: Shortness of breath and weakness; failure to thrive.  CHEST - 1 VIEW  Comparison: Chest radiograph performed 03/28/2011  Findings: The lungs are well-aerated.  Minimal left basilar opacity may reflect atelectasis or possibly pneumonia.  There is no evidence of pleural effusion or pneumothorax.  Mild vascular congestion is noted.  The cardiomediastinal silhouette is borderline normal in size; calcification is seen within the aortic arch.  No acute osseous abnormalities are seen.  IMPRESSION: Mild vascular congestion noted, without significant pulmonary edema.  Minimal left basilar opacity may reflect atelectasis or possibly pneumonia.  Original Report Authenticated By: Tonia Ghent, M.D.    Assessment: Present on Admission:  .Acute renal failure .Hyperkalemia .ARF (acute renal failure) .Nausea and vomiting .Diarrhea .Leukocytosis .Anemia ETOH Abuse  Plan:  Admit to Telemetry Bed Rehydrate and monitor Bun/cr Kayexalate 30 grams given  PO X 1 for hyperkalemia      Re check K+ at 9AM and results to be called to Rounding MD for further orders. Nausea na Vomitng and diarrhea resolved 1 day ago. Monitor WBCs, Blood Cultures X 2 sent Send Anemia Panel  DVT Prophylaxis CIWA Protocol Other plans as per orders.    CODE STATUS:      FULL CODE       Asani Deniston C 02/18/2012, 6:53 AM

## 2012-02-18 NOTE — ED Notes (Signed)
Family at bedside. [phone] Preston Moore

## 2012-02-19 ENCOUNTER — Inpatient Hospital Stay (HOSPITAL_COMMUNITY): Payer: Medicare Other

## 2012-02-19 ENCOUNTER — Encounter (HOSPITAL_COMMUNITY): Payer: Self-pay | Admitting: Gastroenterology

## 2012-02-19 DIAGNOSIS — E860 Dehydration: Secondary | ICD-10-CM

## 2012-02-19 DIAGNOSIS — R197 Diarrhea, unspecified: Secondary | ICD-10-CM

## 2012-02-19 DIAGNOSIS — N179 Acute kidney failure, unspecified: Secondary | ICD-10-CM

## 2012-02-19 DIAGNOSIS — R112 Nausea with vomiting, unspecified: Secondary | ICD-10-CM

## 2012-02-19 LAB — CBC
HCT: 23 % — ABNORMAL LOW (ref 39.0–52.0)
Hemoglobin: 8.2 g/dL — ABNORMAL LOW (ref 13.0–17.0)
MCV: 93.9 fL (ref 78.0–100.0)
RBC: 2.45 MIL/uL — ABNORMAL LOW (ref 4.22–5.81)
RDW: 14 % (ref 11.5–15.5)
WBC: 5.4 10*3/uL (ref 4.0–10.5)

## 2012-02-19 LAB — BASIC METABOLIC PANEL
BUN: 69 mg/dL — ABNORMAL HIGH (ref 6–23)
CO2: 28 mEq/L (ref 19–32)
Chloride: 94 mEq/L — ABNORMAL LOW (ref 96–112)
Creatinine, Ser: 3.42 mg/dL — ABNORMAL HIGH (ref 0.50–1.35)
Glucose, Bld: 89 mg/dL (ref 70–99)
Potassium: 4.1 mEq/L (ref 3.5–5.1)

## 2012-02-19 LAB — HEPATIC FUNCTION PANEL
AST: 27 U/L (ref 0–37)
Albumin: 2.2 g/dL — ABNORMAL LOW (ref 3.5–5.2)
Alkaline Phosphatase: 44 U/L (ref 39–117)
Total Bilirubin: 0.2 mg/dL — ABNORMAL LOW (ref 0.3–1.2)
Total Protein: 4.4 g/dL — ABNORMAL LOW (ref 6.0–8.3)

## 2012-02-19 LAB — PROTIME-INR
INR: 0.93 (ref 0.00–1.49)
Prothrombin Time: 12.7 seconds (ref 11.6–15.2)

## 2012-02-19 LAB — AMMONIA: Ammonia: 22 umol/L (ref 11–60)

## 2012-02-19 LAB — GLUCOSE, CAPILLARY
Glucose-Capillary: 100 mg/dL — ABNORMAL HIGH (ref 70–99)
Glucose-Capillary: 106 mg/dL — ABNORMAL HIGH (ref 70–99)
Glucose-Capillary: 124 mg/dL — ABNORMAL HIGH (ref 70–99)
Glucose-Capillary: 98 mg/dL (ref 70–99)

## 2012-02-19 LAB — OSMOLALITY, URINE: Osmolality, Ur: 349 mOsm/kg — ABNORMAL LOW (ref 390–1090)

## 2012-02-19 LAB — TSH: TSH: 2.236 u[IU]/mL (ref 0.350–4.500)

## 2012-02-19 LAB — RETICULOCYTES: Retic Ct Pct: 2.4 % (ref 0.4–3.1)

## 2012-02-19 MED ORDER — VANCOMYCIN HCL IN DEXTROSE 1-5 GM/200ML-% IV SOLN
1000.0000 mg | INTRAVENOUS | Status: DC
  Administered 2012-02-19: 1000 mg via INTRAVENOUS
  Filled 2012-02-19 (×2): qty 200

## 2012-02-19 NOTE — Progress Notes (Signed)
Subjective:   Patient is somnolent but easily arousable. He says he feels slightly better. Denies further nausea vomiting or diarrhea. Pain on swallowing is improved. According to nursing, no BMs since admission. Patient apparently getting intermittently agitated. Denies chest pain or dyspnea. Overnight events noted-low oxygen saturation at 92% and IV fluids were reduced.  Discussed at length with patient's daughter/health care power of attorney Ms. Cyndi Lennert, at the bedside. Patient lives alone and has been having home health services for the last 2 weeks. They were informed that patient had nausea vomiting and diarrhea 2 days prior to admission. He was weak and unable to get off the chair. He also complained of difficulty swallowing. He has history of heavy alcohol consumption.  Objective  Vital signs in last 24 hours: Filed Vitals:   02/18/12 2153 02/19/12 0207 02/19/12 0509 02/19/12 1017  BP: 96/56 110/61 114/63 123/69  Pulse: 95 97 96 92  Temp: 98.4 F (36.9 C) 98.1 F (36.7 C) 98.3 F (36.8 C) 98.5 F (36.9 C)  TempSrc: Oral Oral Oral Oral  Resp: 18 20 18 16   Height:      Weight:      SpO2: 94% 92% 91% 94%   Weight change:   Intake/Output Summary (Last 24 hours) at 02/19/12 1216 Last data filed at 02/19/12 1016  Gross per 24 hour  Intake 1169.17 ml  Output   1525 ml  Net -355.83 ml    Physical Exam:  General Exam: Chronically ill-looking. Low obvious distress. Respiratory System: Slightly reduced breath sounds in the bases but otherwise clear to auscultation. No increased work of breathing.  Cardiovascular System: First and second heart sounds heard. Regular rate and rhythm. No JVD/murmurs. 2+ pitting bilateral leg edema. Telemetry shows sinus rhythm in the 90s with occasional PVCs. 2 nonsustained runs of SVT. Gastrointestinal System: Abdomen is non distended, soft and normal bowel sounds heard. Nontender. Central Nervous System: Somnolent but arousable, oriented to  self only. No focal neurological deficits. Extremities: symmetric 5/5 power. 2+ bipedal pitting edema and 1+ pitting pedal edema upper extremities.  Labs:  Basic Metabolic Panel:  Lab 02/19/12 6045 02/18/12 0929 02/18/12 0145 02/18/12 0034 02/17/12 2342  NA 130* 125* -- 122* --  K 4.1 5.2* 5.9* -- --  CL 94* 85* -- 91* --  CO2 28 22 -- -- 20  GLUCOSE 89 101* -- 123* --  BUN 69* 72* -- 68* --  CREATININE 3.42* 3.78* -- 3.80* --  CALCIUM 8.7 10.0 -- -- 10.5  ALB -- -- -- -- --  PHOS -- -- -- -- --   Liver Function Tests:  Lab 02/17/12 2342  AST 38*  ALT 20  ALKPHOS 53  BILITOT 0.2*  PROT 5.7*  ALBUMIN 2.9*    Lab 02/19/12 0410 02/18/12 0929  LIPASE 1426* 599*  AMYLASE -- --   No results found for this basename: AMMONIA:3 in the last 168 hours CBC:  Lab 02/19/12 0410 02/18/12 0929 02/18/12 0034 02/17/12 2342  WBC 5.4 10.3 -- 14.0*  NEUTROABS -- -- -- --  HGB 8.2* 8.3* 10.2* --  HCT 23.0* 23.1* 30.0* --  MCV 93.9 94.3 -- 95.2  PLT 203 244 -- 300   Cardiac Enzymes: No results found for this basename: CKTOTAL:5,CKMB:5,CKMBINDEX:5,TROPONINI:5 in the last 168 hours CBG:  Lab 02/19/12 0717 02/18/12 2156 02/18/12 1638  GLUCAP 100* 106* 104*    Iron Studies: No results found for this basename: IRON,TIBC,TRANSFERRIN,FERRITIN in the last 72 hours Studies/Results: Dg Chest 1 View  02/18/2012  *RADIOLOGY REPORT*  Clinical Data: Shortness of breath and weakness; failure to thrive.  CHEST - 1 VIEW  Comparison: Chest radiograph performed 03/28/2011  Findings: The lungs are well-aerated.  Minimal left basilar opacity may reflect atelectasis or possibly pneumonia.  There is no evidence of pleural effusion or pneumothorax.  Mild vascular congestion is noted.  The cardiomediastinal silhouette is borderline normal in size; calcification is seen within the aortic arch.  No acute osseous abnormalities are seen.  IMPRESSION: Mild vascular congestion noted, without significant pulmonary  edema.  Minimal left basilar opacity may reflect atelectasis or possibly pneumonia.  Original Report Authenticated By: Tonia Ghent, M.D.   US Renal  02/18/2012  *RADIOLOGY REPORT*  Clinical Data: Acute renal failure.  RENAL/URINARY TRACT ULTRASOUND COMPLETE  Comparison:  CT scan 05/02/2011.  Findings:  Right Kidney:  12.6 cm in length. Normal renal cortical thickness and echogenicity without focal lesions or hydronephrosis.  Left Kidney:  12.7 cm in length. Normal renal cortical thickness and echogenicity without focal lesions or hydronephrosis.  Bladder:  Normal  IMPRESSION: Unremarkable renal ultrasound examination.  Original Report Authenticated By: P. Loralie Champagne, M.D.   Medications:    . sodium chloride 50 mL/hr at 02/19/12 0214      . enoxaparin  40 mg Subcutaneous Q24H  . folic acid  1 mg Oral Daily  . insulin aspart  0-5 Units Subcutaneous QHS  . insulin aspart  0-9 Units Subcutaneous TID WC  . mulitivitamin with minerals  1 tablet Oral Daily  . pantoprazole (PROTONIX) IV  40 mg Intravenous Q24H  . sodium chloride  3 mL Intravenous Q12H  . thiamine  100 mg Oral Daily   Or  . thiamine  100 mg Intravenous Daily    I  have reviewed scheduled and prn medications.     Problem/Plan: Principal Problem:  *ARF (acute renal failure) Active Problems:  Acute renal failure  Hyperkalemia  Nausea and vomiting  Diarrhea  Leukocytosis  Anemia  Lactic acidosis  1. Nausea, vomiting and diarrhea: no sickly contacts/recent antibiotic exposure. ? Acute viral GE. Check stool CDiff PCR unable to stand because patient has not had any BM since admission. Seems to have improved/resolved. Advance diet as tolerated. 2. Hyponatremic dehydration 2/2 # 1: Improving. IV fluids were reduced secondary to possible hypoxia. 3. Acute renal Failure: creat in sept 2012 was 0.9. Probably multifactorial- dehydration, diuretics, ACEI and possible ATN from hypotension. Hold culprit meds. Non-oliguric. No  obstruction on renal ultrasound. Nephrology consulted. Slightly improved. 4. Hyperkalemia: 2/2 toARF and ACEI. Resolved. 5. Anemia: ? Etiology. No overt bleeding. Check FOBT. Check anemia panel. Stable. Transfuse if hemoglobin is less than 7 g/dL. 6. DM 2: SSI. Hold Metformin. 7. HTN: controlled. 8. Painful swallowing: ? Prior h/o esophageal erosion: PPI. [facility] gastroenterology consulted on 5/5. Await input. 9. Pancreatitis, most likely alcoholic. LFTs unremarkable. Patient denies abdominal pain. Advance diet as tolerated. 10. Alcohol dependence: Monitor for alcohol withdrawal. Continue Ativan protocol and multivitamins. 11. Encephalopathy: Probably multifactorial secondary to? Early alcohol withdrawal, acute renal failure. Check ammonia levels. No focal deficits. Monitor. 12. Anasarca: Secondary to poor nutrition and hypoalbuminemia, now complicated by IV fluids. 13. 1 of 2 blood cultures positive for gram-positive cocci in clusters: Rule out bacteremia versus contaminant. Repeat blood cultures x2. IV vancomycin per pharmacy until bacteremia ruled out.   Discussed with patients daughter/health care power of attorney Ms. Cyndi Lennert at bedside and updated patient's care in detail and answered all questions.  Barnie Sopko 02/19/2012,12:16 PM  LOS: 2 days

## 2012-02-19 NOTE — Progress Notes (Signed)
Pts sats are 92% on 2L. Listened to pts lungs and pt has bilateral crackles. Currently recieving 125cc/hr of NS. Pt has put out 500cc of urine in last 7 hours. NP on call notified and orders obtained to decrease fluids to 50cc/hr. Will continue to monitor pt.

## 2012-02-19 NOTE — Consult Note (Signed)
Reason for Consult:AKI Referring Physician: Dr. Waymon Amato HPI: Preston Moore is a 78 y.o. male with problems that include HTN (on ACE prior to adm), DM, alcohol abuse ( by one report on the medical record - 1/2 gallon per week).  He was admitted to the hospital on 02/18/2012 with several days of nausea, vomiting and diarrhea with inability to keep down solids or liquids.  In the ED he was found to have a sodium of 124, potassium 6.3, BUN 67, creatinine 3.84.  Has received IVF and has been non-oliguric (UOP 1325 yesterday, 1150 today)  But with minimal improvement in renal function and with development of some low o2 sats IVF were decreased.  Renal US normal sized kidneys, no hydro;  UA negative for protein.  Baseline creatinine was 0.9 in 2012.  He has biochemical evidence for pancreatitis, with lipase 599 up to 1426.  Hb has dropped from 10.2 to 8.2.    He is to get a head CT because of altered mental status.  His 4 daughters who are in the room with him state that he has felt somewhat unwell for several months - not getting around well, confused at intervals, and drinking 1/2 gallon of hard liquor/week.  Patient has refused placement in [facility] (very hard headed per daughters). They said he continued to take his meds at home despite his N,V and diarrhea.  Also, there was a physical therapist at his house last Wednesday who reported to the daughters that his BP was low - by their recollection around 90.  Past Medical History  Diagnosis Date  . Atrial flutter   . Diabetes mellitus   . Neuropathy   . Hyperlipidemia   . Syncope and collapse   . Hypertension     Past Surgical History  Procedure Date  . Laminectomy 1965    Allergies: No Known Allergies  Medications:   Prior to Admission medications   Medication Sig Start Date End Date Taking? Authorizing Provider  Cinnamon 500 MG TABS Take 1 tablet by mouth every morning.    Yes Historical Provider, MD  enalapril (VASOTEC) 2.5 MG tablet  take 1 tablet by mouth once daily 07/03/11  Yes Duke Salvia, MD  furosemide (LASIX) 20 MG tablet take 1 tablet by mouth once daily 12/19/11  Yes Duke Salvia, MD  gabapentin (NEURONTIN) 300 MG capsule Take 300 mg by mouth at bedtime.    Yes Historical Provider, MD  HYDROcodone-acetaminophen (VICODIN ES) 7.5-750 MG per tablet Take 1 tablet by mouth 2 (two) times daily.    Yes Historical Provider, MD  insulin aspart (NOVOLOG) 100 UNIT/ML injection Inject 10 Units into the skin daily as needed. For increased sugar level over 150   Yes Historical Provider, MD  insulin glargine (LANTUS) 100 UNIT/ML injection Inject 10 Units into the skin at bedtime.   Yes Historical Provider, MD  metFORMIN (GLUCOPHAGE) 1000 MG tablet Take 1,000 mg by mouth 2 (two) times daily with a meal.     Yes Historical Provider, MD  metoprolol tartrate (LOPRESSOR) 25 MG tablet Take 25 mg by mouth 2 (two) times daily.     Yes Historical Provider, MD  simvastatin (ZOCOR) 20 MG tablet Take 20 mg by mouth daily.     Yes Historical Provider, MD  traZODone (DESYREL) 100 MG tablet Take 300 mg by mouth at bedtime.    Yes Historical Provider, MD   Scheduled Meds: . enoxaparin  40 mg Subcutaneous Q24H  . folic acid  1  mg Oral Daily  . insulin aspart  0-5 Units Subcutaneous QHS  . insulin aspart  0-9 Units Subcutaneous TID WC  . mulitivitamin with minerals  1 tablet Oral Daily  . pantoprazole (PROTONIX) IV  40 mg Intravenous Q24H  . sodium chloride  3 mL Intravenous Q12H  . thiamine  100 mg Oral Daily   Or  . thiamine  100 mg Intravenous Daily  . vancomycin  1,000 mg Intravenous Q24H  Infusions: . sodium chloride 50 mL/hr at 02/19/12 1342   Family History  Problem Relation Age of Onset  . Hypertension Neg Hx   . Coronary artery disease Neg Hx   . Diabetes Neg Hx     Social History:Family  reports that he has been smoking Cigarettes.  He has a 5 pack-year smoking history. He has never used smokeless tobacco. He reports that he  drinks about 21.6 ounces of alcohol per week. He reports that he does not use illicit drugs.(History from daughters) Lives alone in [place], [place]  Has a home health nurse.  Daughter Hermine Messick is power of attorney.(has 4 daughters, 1 son)  ROS: unobtainable from patient due to lethargy  Physical Exam:  Blood pressure 99/61, pulse 91, temperature 97.9 F (36.6 C), temperature source Oral, resp. rate 16, height 5\' 10"  (1.778 m), weight 90.719 kg (200 lb), SpO2 94.00%. Large framed WM.  [place]umbles.  Answers some questions Neck veins do not appear distended Poor effort but lungs grossly clear S1S2 no S3 Abdomen with active BS; mild diffuse tenderness est RUG/mid abd 1+edema LE's with some blistering lesions on foot - dsg in place Foley draining clear urine  Creatinine, Ser  Date/Time Value Range Status  02/19/2012  4:10 AM 3.42* 0.50-1.35 (mg/dL) Final  10/21/1094  0:45 AM 3.78* 0.50-1.35 (mg/dL) Final  4/0/9811 91:47 AM 3.80* 0.50-1.35 (mg/dL) Final  05/17/9561 13:08 PM 3.84* 0.50-1.35 (mg/dL) Final  03/20/7845 96:29 AM 0.9  0.4-1.5 (mg/dL) Final  03/13/4131 44:01 PM 0.7  0.4-1.5 (mg/dL) Final  0/27/2536  6:44 AM 0.92  0.50-1.35 (mg/dL) Final  12/18/7423  9:56 AM 0.80  0.4-1.5 (mg/dL) Final  38/04/5642  3:29 AM 0.71  0.4-1.5 (mg/dL) Final  51/05/8415  6:06 AM 0.71  0.4-1.5 (mg/dL) Final  30/10/6008  9:32 PM 0.8  0.4-1.5 (mg/dL) Final   Basic Metabolic Panel:  Basename 02/19/12 1300 02/19/12 0410 02/18/12 0929  NA -- 130* 125*  K -- 4.1 5.2*  CL -- 94* 85*  CO2 -- 28 22  GLUCOSE -- 89 101*  BUN -- 69* 72*  CREATININE -- 3.42* 3.78*  CALCIUM -- 8.7 10.0  MG 1.6 -- --  PHOS -- -- --   Liver Function Tests:  Basename 02/19/12 1324 02/17/12 2342  AST 27 38*  ALT 12 20  ALKPHOS 44 53  BILITOT 0.2* 0.2*  PROT 4.4* 5.7*  ALBUMIN 2.2* 2.9*   CBC:  Basename 02/19/12 0410 02/18/12 0929  WBC 5.4 10.3  NEUTROABS -- --  HGB 8.2* 8.3*  HCT 23.0* 23.1*  MCV 93.9 94.3  PLT 203 244    Anemia Panel:  Basename 02/19/12 1324  VITAMINB12 --  FOLATE --  FERRITIN --  TIBC --  IRON --  RETICCTPCT 2.4   Lab Results  Component Value Date   LIPASE 1426* 02/19/2012   Urinalysis    Component Value Date/Time   COLORURINE YELLOW 02/18/2012 0017   APPEARANCEUR CLOUDY* 02/18/2012 0017   LABSPEC 1.022 02/18/2012 0017   PHURINE 6.0 02/18/2012 0017   GLUCOSEU NEGATIVE  02/18/2012 0017   HGBUR SMALL* 02/18/2012 0017   BILIRUBINUR NEGATIVE 02/18/2012 0017   KETONESUR TRACE* 02/18/2012 0017   PROTEINUR NEGATIVE 02/18/2012 0017   UROBILINOGEN 0.2 02/18/2012 0017   NITRITE NEGATIVE 02/18/2012 0017   LEUKOCYTESUR NEGATIVE 02/18/2012 0017   Venous lactate 11.3 (5/5) Blood cultures: gm positive cocci in clusters  Dg Chest 1 View  02/18/2012  *RADIOLOGY REPORT*  Clinical Data: Shortness of breath and weakness; failure to thrive.  CHEST - 1 VIEW  Comparison: Chest radiograph performed 03/28/2011  Findings: The lungs are well-aerated.  Minimal left basilar opacity may reflect atelectasis or possibly pneumonia.  There is no evidence of pleural effusion or pneumothorax.  Mild vascular congestion is noted.  The cardiomediastinal silhouette is borderline normal in size; calcification is seen within the aortic arch.  No acute osseous abnormalities are seen.  IMPRESSION: Mild vascular congestion noted, without significant pulmonary edema.  Minimal left basilar opacity may reflect atelectasis or possibly pneumonia.  Original Report Authenticated By: Tonia Ghent, M.D.   US Renal  02/18/2012  *RADIOLOGY REPORT*  Clinical Data: Acute renal failure.  RENAL/URINARY TRACT ULTRASOUND COMPLETE  Comparison:  CT scan 05/02/2011.  Findings:  Right Kidney:  12.6 cm in length. Normal renal cortical thickness and echogenicity without focal lesions or hydronephrosis.  Left Kidney:  12.7 cm in length. Normal renal cortical thickness and echogenicity without focal lesions or hydronephrosis.  Bladder:  Normal  IMPRESSION: Unremarkable  renal ultrasound examination.  Original Report Authenticated By: P. Loralie Champagne, M.D.   Impression: 78 year old man with diabetes, hypertension, heavy alcohol use who presents with nausea, vomiting, diarrhea, acute Hb variation, pancreatitis with:  1. AKI - likely ischemic ATN in setting of ACE, intravascular volume depletion, soft BP's, acute drop in Hb and gram positive bacteremia.Marland Kitchen  He is off the ACE, BP's still soft (but agree with reducinig IVF given drop in O2 sats).  He is non-oliguric, US shows no hydro and UA is bland..  Agree with supportive Rx at this time.  Doubt there is any hepatorenal component. Would continue slow IVF as you are 2. Anemia with acute Hb variation in setting of GI illness - check stools; check iron studies; GI following 3. Pancreatitis - suspect alcoholic; ? CT 4. Alcohol abuse - watch for DT's 5. Diabetes - off metformin (lactate high on admit) 6. Encephalopathy - CT pending (daughters suggest he has not been "normal" for some time, but clearly not lethargic as he is now)Doubt the AKI is playing a big role here - suspect this is multifactorial 7. N,V,D - checking CDiff; GI aware 8. Gram positive bacteremia - vanco started; watch levels carefully! Source uncertain (?foot wounds?)  Thanks for consult - will follow.  Tykisha Areola B 02/19/2012, 6:42 PM

## 2012-02-19 NOTE — Consult Note (Signed)
Reason for Consult: Multiple GI complaints Referring Physician: Hospital team  Preston Moore is an 78 y.o. male.  HPI: Patient known to me from a previous GI bleed and anemia with 2 endoscopies and 1 colonoscopy in the past who has not been seen in the office for almost one year and unfortunately had gone to a nursing home over the summer and had done very well but started increased alcohol consumption in the last month or 2 and has not been eating and did complain of some nausea vomiting diarrhea and odynophagia and was admitted and found to be anemic and in renal failure and we were consult for further workup and plans. His chart was reviewed and I discussed the case with his daughter and all of her questions were answered. Unfortunately he is very lethargic but has no obvious specific complaints and can be awoken but falls back asleep Past Medical History  Diagnosis Date  . Atrial flutter   . Diabetes mellitus   . Neuropathy   . Hyperlipidemia   . Syncope and collapse   . Hypertension     Past Surgical History  Procedure Date  . Laminectomy 1965    Family History  Problem Relation Age of Onset  . Hypertension Neg Hx   . Coronary artery disease Neg Hx   . Diabetes Neg Hx     Social History:  reports that he has been smoking Cigarettes.  He has a 5 pack-year smoking history. He has never used smokeless tobacco. He reports that he drinks about 21.6 ounces of alcohol per week. He reports that he does not use illicit drugs.  Allergies: No Known Allergies  Medications: I have reviewed the patient's current medications.  Results for orders placed during the hospital encounter of 02/17/12 (from the past 48 hour(s))  CBC     Status: Abnormal   Collection Time   02/17/12 11:42 PM      Component Value Range Comment   WBC 14.0 (*) 4.0 - 10.5 (K/uL)    RBC 3.12 (*) 4.22 - 5.81 (MIL/uL)    Hemoglobin 10.5 (*) 13.0 - 17.0 (g/dL)    HCT 16.1 (*) 09.6 - 52.0 (%)    MCV 95.2  78.0 - 100.0  (fL)    MCH 33.7  26.0 - 34.0 (pg)    MCHC 35.4  30.0 - 36.0 (g/dL)    RDW 04.5  40.9 - 81.1 (%)    Platelets 300  150 - 400 (K/uL)   COMPREHENSIVE METABOLIC PANEL     Status: Abnormal   Collection Time   02/17/12 11:42 PM      Component Value Range Comment   Sodium 124 (*) 135 - 145 (mEq/L)    Potassium 6.3 (*) 3.5 - 5.1 (mEq/L)    Chloride 79 (*) 96 - 112 (mEq/L)    CO2 20  19 - 32 (mEq/L)    Glucose, Bld 128 (*) 70 - 99 (mg/dL)    BUN 67 (*) 6 - 23 (mg/dL)    Creatinine, Ser 9.14 (*) 0.50 - 1.35 (mg/dL)    Calcium 78.2  8.4 - 10.5 (mg/dL)    Total Protein 5.7 (*) 6.0 - 8.3 (g/dL)    Albumin 2.9 (*) 3.5 - 5.2 (g/dL)    AST 38 (*) 0 - 37 (U/L)    ALT 20  0 - 53 (U/L)    Alkaline Phosphatase 53  39 - 117 (U/L)    Total Bilirubin 0.2 (*) 0.3 - 1.2 (mg/dL)  GFR calc non Af Amer 14 (*) >90 (mL/min)    GFR calc Af Amer 16 (*) >90 (mL/min)   ETHANOL     Status: Normal   Collection Time   02/17/12 11:42 PM      Component Value Range Comment   Alcohol, Ethyl (B) <11  0 - 11 (mg/dL)   URINALYSIS, ROUTINE W REFLEX MICROSCOPIC     Status: Abnormal   Collection Time   02/18/12 12:17 AM      Component Value Range Comment   Color, Urine YELLOW  YELLOW     APPearance CLOUDY (*) CLEAR     Specific Gravity, Urine 1.022  1.005 - 1.030     pH 6.0  5.0 - 8.0     Glucose, UA NEGATIVE  NEGATIVE (mg/dL)    Hgb urine dipstick SMALL (*) NEGATIVE     Bilirubin Urine NEGATIVE  NEGATIVE     Ketones, ur TRACE (*) NEGATIVE (mg/dL)    Protein, ur NEGATIVE  NEGATIVE (mg/dL)    Urobilinogen, UA 0.2  0.0 - 1.0 (mg/dL)    Nitrite NEGATIVE  NEGATIVE     Leukocytes, UA NEGATIVE  NEGATIVE    URINE MICROSCOPIC-ADD ON     Status: Normal   Collection Time   02/18/12 12:17 AM      Component Value Range Comment   WBC, UA 0-2  <3 (WBC/hpf)    RBC / HPF 3-6  <3 (RBC/hpf)   NA AND K (SODIUM & POTASSIUM), RAND UR     Status: Normal   Collection Time   02/18/12 12:17 AM      Component Value Range Comment   Sodium,  Ur 16      Potassium Urine Timed 52     OSMOLALITY, URINE     Status: Abnormal   Collection Time   02/18/12 12:17 AM      Component Value Range Comment   Osmolality, Ur 349 (*) 390 - 1090 (mOsm/kg)   POCT I-STAT, CHEM 8     Status: Abnormal   Collection Time   02/18/12 12:34 AM      Component Value Range Comment   Sodium 122 (*) 135 - 145 (mEq/L)    Potassium 6.1 (*) 3.5 - 5.1 (mEq/L)    Chloride 91 (*) 96 - 112 (mEq/L)    BUN 68 (*) 6 - 23 (mg/dL)    Creatinine, Ser 4.09 (*) 0.50 - 1.35 (mg/dL)    Glucose, Bld 811 (*) 70 - 99 (mg/dL)    Calcium, Ion 9.14  1.12 - 1.32 (mmol/L)    TCO2 21  0 - 100 (mmol/L)    Hemoglobin 10.2 (*) 13.0 - 17.0 (g/dL)    HCT 78.2 (*) 95.6 - 52.0 (%)   LACTIC ACID, PLASMA     Status: Abnormal   Collection Time   02/18/12  1:45 AM      Component Value Range Comment   Lactic Acid, Venous 11.3 (*) 0.5 - 2.2 (mmol/L)   POTASSIUM     Status: Abnormal   Collection Time   02/18/12  1:45 AM      Component Value Range Comment   Potassium 5.9 (*) 3.5 - 5.1 (mEq/L)   CULTURE, BLOOD (ROUTINE X 2)     Status: Normal (Preliminary result)   Collection Time   02/18/12  3:43 AM      Component Value Range Comment   Specimen Description BLOOD RIGHT ANTECUBITAL      Special Requests BOTTLES DRAWN AEROBIC AND ANAEROBIC  [facility]      Culture  Setup Time [id number]      Culture        Value: GRAM POSITIVE COCCI IN CLUSTERS     Note: Gram Stain Report Called to,Read Back By and Verified With: JACQUELINE BIGPEN 02/19/12 1140 BY SMITHERSJ   Report Status PENDING     CULTURE, BLOOD (ROUTINE X 2)     Status: Normal (Preliminary result)   Collection Time   02/18/12  3:45 AM      Component Value Range Comment   Specimen Description BLOOD RIGHT HAND      Special Requests BOTTLES DRAWN AEROBIC ONLY 3CC      Culture  Setup Time [id number]      Culture        Value:        BLOOD CULTURE RECEIVED NO GROWTH TO DATE CULTURE WILL BE HELD FOR 5 DAYS BEFORE ISSUING A FINAL NEGATIVE REPORT    Report Status PENDING     CBC     Status: Abnormal   Collection Time   02/18/12  9:29 AM      Component Value Range Comment   WBC 10.3  4.0 - 10.5 (K/uL)    RBC 2.45 (*) 4.22 - 5.81 (MIL/uL)    Hemoglobin 8.3 (*) 13.0 - 17.0 (g/dL)    HCT 21.3 (*) 08.6 - 52.0 (%)    MCV 94.3  78.0 - 100.0 (fL)    MCH 33.9  26.0 - 34.0 (pg)    MCHC 35.9  30.0 - 36.0 (g/dL)    RDW 57.8  46.9 - 62.9 (%)    Platelets 244  150 - 400 (K/uL)   BASIC METABOLIC PANEL     Status: Abnormal   Collection Time   02/18/12  9:29 AM      Component Value Range Comment   Sodium 125 (*) 135 - 145 (mEq/L)    Potassium 5.2 (*) 3.5 - 5.1 (mEq/L)    Chloride 85 (*) 96 - 112 (mEq/L)    CO2 22  19 - 32 (mEq/L)    Glucose, Bld 101 (*) 70 - 99 (mg/dL)    BUN 72 (*) 6 - 23 (mg/dL)    Creatinine, Ser 5.28 (*) 0.50 - 1.35 (mg/dL)    Calcium 41.3  8.4 - 10.5 (mg/dL)    GFR calc non Af Amer 14 (*) >90 (mL/min)    GFR calc Af Amer 17 (*) >90 (mL/min)   LIPASE, BLOOD     Status: Abnormal   Collection Time   02/18/12  9:29 AM      Component Value Range Comment   Lipase 599 (*) 11 - 59 (U/L)   GLUCOSE, CAPILLARY     Status: Abnormal   Collection Time   02/18/12  4:38 PM      Component Value Range Comment   Glucose-Capillary 104 (*) 70 - 99 (mg/dL)   GLUCOSE, CAPILLARY     Status: Abnormal   Collection Time   02/18/12  9:56 PM      Component Value Range Comment   Glucose-Capillary 106 (*) 70 - 99 (mg/dL)   CBC     Status: Abnormal   Collection Time   02/19/12  4:10 AM      Component Value Range Comment   WBC 5.4  4.0 - 10.5 (K/uL)    RBC 2.45 (*) 4.22 - 5.81 (MIL/uL)    Hemoglobin 8.2 (*) 13.0 - 17.0 (g/dL)    HCT 24.4 (*) 01.0 -  52.0 (%)    MCV 93.9  78.0 - 100.0 (fL)    MCH 33.5  26.0 - 34.0 (pg)    MCHC 35.7  30.0 - 36.0 (g/dL)    RDW 65.7  84.6 - 96.2 (%)    Platelets 203  150 - 400 (K/uL)   BASIC METABOLIC PANEL     Status: Abnormal   Collection Time   02/19/12  4:10 AM      Component Value Range Comment   Sodium 130  (*) 135 - 145 (mEq/L)    Potassium 4.1  3.5 - 5.1 (mEq/L) DELTA CHECK NOTED   Chloride 94 (*) 96 - 112 (mEq/L) DELTA CHECK NOTED   CO2 28  19 - 32 (mEq/L)    Glucose, Bld 89  70 - 99 (mg/dL)    BUN 69 (*) 6 - 23 (mg/dL)    Creatinine, Ser 9.52 (*) 0.50 - 1.35 (mg/dL)    Calcium 8.7  8.4 - 10.5 (mg/dL)    GFR calc non Af Amer 16 (*) >90 (mL/min)    GFR calc Af Amer 19 (*) >90 (mL/min)   LIPASE, BLOOD     Status: Abnormal   Collection Time   02/19/12  4:10 AM      Component Value Range Comment   Lipase 1426 (*) 11 - 59 (U/L)   GLUCOSE, CAPILLARY     Status: Abnormal   Collection Time   02/19/12  7:17 AM      Component Value Range Comment   Glucose-Capillary 100 (*) 70 - 99 (mg/dL)   GLUCOSE, CAPILLARY     Status: Abnormal   Collection Time   02/19/12 11:37 AM      Component Value Range Comment   Glucose-Capillary 106 (*) 70 - 99 (mg/dL)     Dg Chest 1 View  05/20/1323  *RADIOLOGY REPORT*  Clinical Data: Shortness of breath and weakness; failure to thrive.  CHEST - 1 VIEW  Comparison: Chest radiograph performed 03/28/2011  Findings: The lungs are well-aerated.  Minimal left basilar opacity may reflect atelectasis or possibly pneumonia.  There is no evidence of pleural effusion or pneumothorax.  Mild vascular congestion is noted.  The cardiomediastinal silhouette is borderline normal in size; calcification is seen within the aortic arch.  No acute osseous abnormalities are seen.  IMPRESSION: Mild vascular congestion noted, without significant pulmonary edema.  Minimal left basilar opacity may reflect atelectasis or possibly pneumonia.  Original Report Authenticated By: Tonia Ghent, M.D.   US Renal  02/18/2012  *RADIOLOGY REPORT*  Clinical Data: Acute renal failure.  RENAL/URINARY TRACT ULTRASOUND COMPLETE  Comparison:  CT scan 05/02/2011.  Findings:  Right Kidney:  12.6 cm in length. Normal renal cortical thickness and echogenicity without focal lesions or hydronephrosis.  Left Kidney:  12.7 cm  in length. Normal renal cortical thickness and echogenicity without focal lesions or hydronephrosis.  Bladder:  Normal  IMPRESSION: Unremarkable renal ultrasound examination.  Original Report Authenticated By: P. Loralie Champagne, M.D.    ROS unfortunately patients pump inhibitor was stopped some time over the last year Blood pressure 128/72, pulse 90, temperature 98.5 F (36.9 C), temperature source Oral, resp. rate 16, height 5\' 10"  (1.778 m), weight 90.719 kg (200 lb), SpO2 94.00%. Physical Exam vital signs stable afebrile lethargic but arousable no acute distress specifically abdominal exam is soft nontender good bowel sounds bilateral pedal edema but adequate peripheral pulses and labs were reviewed and no new abdominal x-ray  Assessment/Plan: Multiple medical problems in a patient with  known hiatal hernia and reflux as well as any significant recent alcohol problem Plan: Continue rehydration minimize pain medicines and anti-anxiety medicines to allow patient to wake up continue pump inhibitors and the daughter will check with her primary care doctor to see when his last outpatient labs were and we will follow with you and consider workup pending continuation of GI symptoms. He might require a head CT and possibly TPN or possibly enteral feeding with an NG tube  Nikyah Lackman E 02/19/2012, 12:51 PM

## 2012-02-19 NOTE — Progress Notes (Signed)
ANTIBIOTIC CONSULT NOTE - INITIAL  Pharmacy Consult for Vanco Indication: Rule Out Bacteremia  No Known Allergies  Patient Measurements: Height: 5\' 10"  (177.8 cm) Weight: 200 lb (90.719 kg) IBW/kg (Calculated) : 73   Vital Signs: Temp: 98.5 F (36.9 C) (05/06 1017) Temp src: Oral (05/06 1017) BP: 123/69 mmHg (05/06 1017) Pulse Rate: 92  (05/06 1017) Intake/Output from previous day: 05/05 0701 - 05/06 0700 In: 1139.2 [I.V.:1139.2] Out: 1325 [Urine:1325] Intake/Output from this shift: Total I/O In: 30 [P.O.:30] Out: 400 [Urine:400]  Labs:  Basename 02/19/12 0410 02/18/12 0929 02/18/12 0034 02/17/12 2342  WBC 5.4 10.3 -- 14.0*  HGB 8.2* 8.3* 10.2* --  PLT 203 244 -- 300  LABCREA -- -- -- --  CREATININE 3.42* 3.78* 3.80* --   Estimated Creatinine Clearance: 21.1 ml/min (by C-G formula based on Cr of 3.42). No results found for this basename: VANCOTROUGH:2,VANCOPEAK:2,VANCORANDOM:2,GENTTROUGH:2,GENTPEAK:2,GENTRANDOM:2,TOBRATROUGH:2,TOBRAPEAK:2,TOBRARND:2,AMIKACINPEAK:2,AMIKACINTROU:2,AMIKACIN:2, in the last 72 hours   Microbiology: Recent Results (from the past 720 hour(s))  CULTURE, BLOOD (ROUTINE X 2)     Status: Normal (Preliminary result)   Collection Time   02/18/12  3:43 AM      Component Value Range Status Comment   Specimen Description BLOOD RIGHT ANTECUBITAL   Final    Special Requests BOTTLES DRAWN AEROBIC AND ANAEROBIC [facility]   Final    Culture  Setup Time [id number]   Final    Culture     Final    Value: GRAM POSITIVE COCCI IN CLUSTERS     Note: Gram Stain Report Called to,Read Back By and Verified With: JACQUELINE BIGPEN 02/19/12 1140 BY SMITHERSJ   Report Status PENDING   Incomplete   CULTURE, BLOOD (ROUTINE X 2)     Status: Normal (Preliminary result)   Collection Time   02/18/12  3:45 AM      Component Value Range Status Comment   Specimen Description BLOOD RIGHT HAND   Final    Special Requests BOTTLES DRAWN AEROBIC ONLY 3CC   Final    Culture  Setup  Time [id number]   Final    Culture     Final    Value:        BLOOD CULTURE RECEIVED NO GROWTH TO DATE CULTURE WILL BE HELD FOR 5 DAYS BEFORE ISSUING A FINAL NEGATIVE REPORT   Report Status PENDING   Incomplete     Medical History: Past Medical History  Diagnosis Date  . Atrial flutter   . Diabetes mellitus   . Neuropathy   . Hyperlipidemia   . Syncope and collapse   . Hypertension     Medications:  Anti-infectives    None     Assessment: Preston Moore admitted 02/18/12 with acute renal failure and hyperkalemia, now with 1 of 2 blood cultures positive for GPC clusters. Plan to start IV vanco now and repeat blood Cx to rule out bacteremia. Note current CrCl ~ 20 ml/min.  Goal of Therapy:  Vancomycin trough level 15-20 mcg/ml  Plan:  1) Vanco 1g IV q24h 2) F/U all 4 blood Cx 3) Watch SCr vs vanco dose  Darrol Angel, PharmD Pager: [phone] 02/19/2012,12:50 PM

## 2012-02-19 NOTE — Progress Notes (Signed)
Pt had a less than three second of SVT in 160s.  Heart rate decreased to 80s after this short episode.  Pt was sitting up in bed, drinking fluids at the time of increase heart rate.  Pt has no complaints at this time.  Will continue to monitor patient.

## 2012-02-19 NOTE — Progress Notes (Signed)
   CARE MANAGEMENT NOTE 02/19/2012  Patient:  Preston Moore, Preston Moore   Account Number:  [id number]  Date Initiated:  02/19/2012  Documentation initiated by:  Jiles Crocker  Subjective/Objective Assessment:   ADMITTED WITH HYPERKALEMIA, NAUSEA/ VOMITING, DIARRHEA     Action/Plan:   PCP IS DR Clovis Riley; LIVES AT HOME ALONE   Anticipated DC Date:  02/26/2012   Anticipated DC Plan:  HOME W HOME HEALTH SERVICES  In-house referral  Clinical Social Worker            Status of service:  In process, will continue to follow Medicare Important Message given?   (If response is "NO", the following Medicare IM given date fields will be blank)  Per UR Regulation:  Reviewed for med. necessity/level of care/duration of stay  Comments:  02/19/2012 - B Michele Kerlin RN, BSN, MHA

## 2012-02-20 DIAGNOSIS — R112 Nausea with vomiting, unspecified: Secondary | ICD-10-CM

## 2012-02-20 DIAGNOSIS — N179 Acute kidney failure, unspecified: Secondary | ICD-10-CM

## 2012-02-20 DIAGNOSIS — R197 Diarrhea, unspecified: Secondary | ICD-10-CM

## 2012-02-20 DIAGNOSIS — E860 Dehydration: Secondary | ICD-10-CM

## 2012-02-20 LAB — IRON AND TIBC
Iron: 11 ug/dL — ABNORMAL LOW (ref 42–135)
Saturation Ratios: 7 % — ABNORMAL LOW (ref 20–55)
TIBC: 163 ug/dL — ABNORMAL LOW (ref 215–435)

## 2012-02-20 LAB — CBC
Hemoglobin: 8.1 g/dL — ABNORMAL LOW (ref 13.0–17.0)
MCH: 33.6 pg (ref 26.0–34.0)
MCHC: 35.5 g/dL (ref 30.0–36.0)
Platelets: 168 10*3/uL (ref 150–400)

## 2012-02-20 LAB — BASIC METABOLIC PANEL
Calcium: 7.9 mg/dL — ABNORMAL LOW (ref 8.4–10.5)
Creatinine, Ser: 2.68 mg/dL — ABNORMAL HIGH (ref 0.50–1.35)
GFR calc Af Amer: 25 mL/min — ABNORMAL LOW (ref >90)
Sodium: 133 mEq/L — ABNORMAL LOW (ref 135–145)

## 2012-02-20 LAB — CULTURE, BLOOD (ROUTINE X 2): Culture  Setup Time: 201305051053

## 2012-02-20 LAB — VITAMIN B12: Vitamin B-12: 110 pg/mL — ABNORMAL LOW (ref 211–911)

## 2012-02-20 LAB — LIPASE, BLOOD: Lipase: 280 U/L — ABNORMAL HIGH (ref 11–59)

## 2012-02-20 LAB — FERRITIN: Ferritin: 399 ng/mL — ABNORMAL HIGH (ref 22–322)

## 2012-02-20 LAB — GLUCOSE, CAPILLARY
Glucose-Capillary: 139 mg/dL — ABNORMAL HIGH (ref 70–99)
Glucose-Capillary: 204 mg/dL — ABNORMAL HIGH (ref 70–99)
Glucose-Capillary: 219 mg/dL — ABNORMAL HIGH (ref 70–99)

## 2012-02-20 MED ORDER — FERUMOXYTOL INJECTION 510 MG/17 ML
510.0000 mg | Freq: Once | INTRAVENOUS | Status: AC
  Administered 2012-02-20: 510 mg via INTRAVENOUS
  Filled 2012-02-20: qty 17

## 2012-02-20 MED ORDER — CYANOCOBALAMIN 1000 MCG/ML IJ SOLN
1000.0000 ug | Freq: Every day | INTRAMUSCULAR | Status: DC
  Administered 2012-02-20 – 2012-02-22 (×3): 1000 ug via SUBCUTANEOUS
  Filled 2012-02-20 (×3): qty 1

## 2012-02-20 MED ORDER — HYDROMORPHONE HCL PF 1 MG/ML IJ SOLN: 0.5000 mg | INTRAMUSCULAR | Status: DC | PRN

## 2012-02-20 MED FILL — Insulin Aspart Prot & Aspart (Human) Inj 100 Unit/ML (70-30): SUBCUTANEOUS | Qty: 0.05 | Status: AC

## 2012-02-20 NOTE — Evaluation (Signed)
Physical Therapy Evaluation Patient Details Name: Preston Moore MRN: [id number] DOB: 1934-08-29 Today's Date: 02/20/2012 Time: 3474-2595 PT Time Calculation (min): 21 min  PT Assessment / Plan / Recommendation Clinical Impression  Pt presents with diagnosis of acute renal failure, weakness. Pt very deconditioned at time of eval. Limited tolerance for all activity, especially standing. Pt was living alone PTA. Do not feel pt is safe to d/c home alone. Discussed d/c options with pt and family(daughters)- daughters in agreement that rehab is needed. Pt not yet agreeable to rehab however pt does demonstrate poor insight regarding overall condition and ability to function independently at home. Recommended daughters have discussion with MD about issues concerning d/c.     PT Assessment  Patient needs continued PT services    Follow Up Recommendations  Skilled nursing facility (HHPT with 24 hour supervision if pt does d/c home)    Equipment Recommendations  Defer to next venue    Frequency Min 3X/week    Precautions / Restrictions Precautions Precautions: Fall Restrictions Weight Bearing Restrictions: No   Pertinent Vitals/Pain       Mobility  Bed Mobility Bed Mobility: Sit to Supine;Rolling Right;Rolling Left Rolling Right: 2: Max assist Rolling Left: 2: Max assist Sit to Supine: 2: Max assist Details for Bed Mobility Assistance: Multimodal cues for safety, technique, hand placement. Assist for bil LEs onto bed and for trunk positioning. Assist + use of bedpad for completion of rolling task. Increased time.  Transfers Transfers: Sit to Stand;Stand to Sit Sit to Stand: From bed;2: Max assist Stand to Sit: To bed;2: Max assist Details for Transfer Assistance: Multimodal cues for safety, technique, hand placement. Assist for rise, stabilize, control descent. Pt only able to stand for 30 seconds before needing to sit.  Ambulation/Gait Ambulation/Gait Assistance Details: 1-2 side  steps to [facility] with RW. Difficulty weight-shifting and stepping especially with R LE. Deferred ambulation trial for safety reasons (risk for fall, weakness) Gait Pattern: Trunk flexed;Wide base of support    Exercises     PT Goals Acute Rehab PT Goals PT Goal Formulation: With patient/family Time For Goal Achievement: 03/05/12 Potential to Achieve Goals: Fair Pt will go Supine/Side to Sit: with min assist PT Goal: Supine/Side to Sit - Progress: Goal set today Pt will go Sit to Supine/Side: with min assist PT Goal: Sit to Supine/Side - Progress: Goal set today Pt will go Sit to Stand: with min assist PT Goal: Sit to Stand - Progress: Goal set today Pt will Transfer Bed to Chair/Chair to Bed: with min assist PT Transfer Goal: Bed to Chair/Chair to Bed - Progress: Goal set today Pt will Ambulate: 16 - 50 feet;with min assist;with least restrictive assistive device PT Goal: Ambulate - Progress: Goal set today Pt will Perform Home Exercise Program: with supervision, verbal cues required/provided PT Goal: Perform Home Exercise Program - Progress: Goal set today  Visit Information  Last PT Received On: 02/20/12 Assistance Needed: +2    Subjective Data  Subjective: "I don't want yall using those used emergency dept gloves on me" Patient Stated Goal: Get better and go home. Daghter wants rehab.    Prior Functioning  Home Living Lives With: Alone Available Help at Discharge: Personal care attendant (2x/week) Type of Home: House Home Access: Stairs to enter [employer] of Steps: 5 Entrance Stairs-Rails: Right Home Layout: One level Bathroom Toilet: Standard Home Adaptive Equipment: Walker - rolling;Straight cane Prior Function Level of Independence: Needs assistance Needs Assistance: Bathing Bath: Maximal (has aide that  helps 2x/week) Communication Communication: No difficulties    Cognition  Overall Cognitive Status: Impaired Area of Impairment:  Attention;Safety/judgement;Awareness of deficits;Problem solving Arousal/Alertness: Lethargic Orientation Level: Person;Place Behavior During Session: Lethargic Current Attention Level: Selective Safety/Judgement: Decreased safety judgement for tasks assessed;Decreased awareness of need for assistance Cognition - Other Comments: poor insight    Extremity/Trunk Assessment Right Lower Extremity Assessment RLE ROM/Strength/Tone: Deficits RLE ROM/Strength/Tone Deficits: Strength 3+/5 with functional activity Left Lower Extremity Assessment LLE ROM/Strength/Tone: Deficits LLE ROM/Strength/Tone Deficits: Strength 3+/5 with functional activity   Balance Static Sitting Balance Static Sitting - Level of Assistance: 4: Min assist Static Sitting - Comment/# of Minutes: sat eob 1-2 minutes. leaning posteriorly. verbal cues and assistance to correct  End of Session PT - End of Session Equipment Utilized During Treatment: Gait belt Activity Tolerance: Patient limited by fatigue Patient left: in bed;with call bell/phone within reach;with family/visitor present Nurse Communication: Mobility status   Rebeca Alert [facility] 02/20/2012, 2:49 PM [phone]

## 2012-02-20 NOTE — Progress Notes (Signed)
Preston Moore 12:51 PM  Subjective: The patient is doing much better than yesterday and is eating some with only mild midepigastric discomfort and his main complaints are his back and his hip from lying in bed and he has not had a bowel movement and no new complaints and his case was discussed with himself and his daughter  Objective: Vital signs stable afebrile no acute distress much more alert abdomen is soft nontender BUN and creatinine improved lipase which was not back yesterday when I saw him improved  Assessment: Probable alcoholic pancreatitis and acute renal failure  Plan: Agree with scan of abdomen when BUN and creatinine are better or when necessary continue pump inhibitors long-term and will follow with you for now  [facility] E

## 2012-02-20 NOTE — Progress Notes (Signed)
[facility] Progress Note  Subjective:  Much more alert.  Ate a little lunch.  Talking with his daughters, though still a little confused. Has the hiccups  Objective Filed Vitals:   02/19/12 2223 02/20/12 0210 02/20/12 0630 02/20/12 1005  BP: 139/76 96/53 121/67 120/71  Pulse: 104 92 92 86  Temp: 98.7 F (37.1 C) 98.3 F (36.8 C) 98.5 F (36.9 C) 98.3 F (36.8 C)  TempSrc: Oral Oral Oral Oral  Resp: 18 18 18 18   Height:      Weight:      SpO2: 94% 91% 97% 96%  I/O last 3 completed shifts: In: 2585 [P.O.:870; I.V.:1715] Out: 3175 [Urine:3175] Total I/O In: 120 [P.O.:120] Out: -  Physical Exam General: Much more awake and in NAD Heart: S1S2 Lungs:Decr BS Abdomen:Soft NT Extremities:some edema 1+  Additional Objective Labs: Basic Metabolic Panel:  Lab 02/20/12 1610 02/19/12 0410 02/18/12 0929  NA 133* 130* 125*  K 3.5 4.1 5.2*  CL 97 94* 85*  CO2 28 28 22   GLUCOSE 134* 89 101*  BUN 55* 69* 72*  CREATININE 2.68* 3.42* 3.78*  CALCIUM 7.9* 8.7 10.0  ALB -- -- --  PHOS -- -- --   Liver Function Tests:  Lab 02/19/12 1324 02/17/12 2342  AST 27 38*  ALT 12 20  ALKPHOS 44 53  BILITOT 0.2* 0.2*  PROT 4.4* 5.7*  ALBUMIN 2.2* 2.9*    Lab 02/20/12 0500 02/19/12 0410 02/18/12 0929  LIPASE 280* 1426* 599*  AMYLASE -- -- --    Lab 02/19/12 1206  AMMONIA 22   INR: No components found with this basename: INR3 CBC:  Lab 02/20/12 0500 02/19/12 0410 02/18/12 0929 02/17/12 2342  WBC 5.8 5.4 10.3 --  NEUTROABS -- -- -- --  HGB 8.1* 8.2* 8.3* --  HCT 22.8* 23.0* 23.1* --  MCV 94.6 93.9 94.3 95.2  PLT 168 203 244 --   Blood Culture    Component Value Date/Time   SDES BLOOD RIGHT HAND 02/19/2012 1324   SPECREQUEST BOTTLES DRAWN AEROBIC AND ANAEROBIC 5CC 02/19/2012 1324   CULT        BLOOD CULTURE RECEIVED NO GROWTH TO DATE CULTURE WILL BE HELD FOR 5 DAYS BEFORE ISSUING A FINAL NEGATIVE REPORT 02/19/2012 1324   REPTSTATUS PENDING 02/19/2012 1324     Cardiac Enzymes: No results found for this basename: CKTOTAL:5,CKMB:5,CKMBINDEX:5,TROPONINI:5 in the last 168 hours CBG:  Lab 02/20/12 1123 02/20/12 0658 02/19/12 2217 02/19/12 1656 02/19/12 1137  GLUCAP 204* 139* 98 124* 106*   Iron Studies:  Basename 02/20/12 0500  IRON 10*  TIBC 146*  TRANSFERRIN --  FERRITIN 359*   Studies/Results: Ct Head Wo Contrast  02/19/2012  *RADIOLOGY REPORT*  Clinical Data: Altered mental status.  Ethanol use.  CT HEAD WITHOUT CONTRAST  Technique:  Contiguous axial images were obtained from the base of the skull through the vertex without contrast.  Comparison: None.  Findings: Slightly prominent fourth ventricle noted, without apparent underlying mass lesion or asymmetry.  This is thought to be incidental.  The cerebellum appears unremarkable.  The cerebral peduncles and thalami appear normal.  The basal ganglia likewise appear unremarkable.  The lateral ventricles appear within normal limits.  No intracranial hemorrhage, mass lesion, or acute CVA is observed.  IMPRESSION:  1.  No significant intracranial abnormality is observed to explain the patient's altered mental status.  Original Report Authenticated By: Dellia Cloud, M.D.   US Renal  02/18/2012  *RADIOLOGY REPORT*  Clinical Data:  Acute renal failure.  RENAL/URINARY TRACT ULTRASOUND COMPLETE  Comparison:  CT scan 05/02/2011.  Findings:  Right Kidney:  12.6 cm in length. Normal renal cortical thickness and echogenicity without focal lesions or hydronephrosis.  Left Kidney:  12.7 cm in length. Normal renal cortical thickness and echogenicity without focal lesions or hydronephrosis.  Bladder:  Normal  IMPRESSION: Unremarkable renal ultrasound examination.  Original Report Authenticated By: P. Loralie Champagne, M.D.   Medications: . sodium chloride 50 mL/hr at 02/20/12 0914   . cyanocobalamin  1,000 mcg Subcutaneous Daily  . enoxaparin  40 mg Subcutaneous Q24H  . folic acid  1 mg Oral Daily  .  insulin aspart  0-5 Units Subcutaneous QHS  . insulin aspart  0-9 Units Subcutaneous TID WC  . mulitivitamin with minerals  1 tablet Oral Daily  . pantoprazole (PROTONIX) IV  40 mg Intravenous Q24H  . sodium chloride  3 mL Intravenous Q12H  . thiamine  100 mg Oral Daily   Or  . thiamine  100 mg Intravenous Daily  . DISCONTD: vancomycin  1,000 mg Intravenous Q24H    I  have reviewed scheduled and prn medications.  Assessment/Recommendations:  78 year old man with diabetes, hypertension, heavy alcohol use who presents with nausea, vomiting, diarrhea, acute Hb variation, pancreatitis with:  1. AKI - likely ischemic ATN in setting of ACE, intravascular volume depletion, soft BP's, acute drop in Hb and gram positive bacteremia. He is non-oliguric, US shows no hydro and UA is bland.  Agree with  Continued supportive Rx at this time.  Would decrease fluids to KVO rate.  Renal function is improving and urine output is excellent.  Suspect he has "rounded the corner" renal wise.  2. Anemia with acute Hb variation in setting of GI illness - appears to be quite iron deficient (in addition to his B12 deficiency).  Would benefit from repletion of iron intravenously.  Have ordered Feraheme.   3. Pancreatitis - suspect alcoholic; lipase is improving. 4. Alcohol abuse - watch for DT's  5. Diabetes - off metformin (lactate high on admit) - per primary  6. Encephalopathy - CT negative (daughters suggest he has not been "normal" for some time, but clearly not lethargic as he is now)Doubt the AKI is playing a big role here - suspect this is multifactorial  7. N,V,D - no longer having BM's  8. Gram positive bacteremia - vanco stopped as felt possibly contaminant  I have spoken with 2 of daughters about his renal progress.  I would not restart ACE in this patient until stable outpt and renal function back to baseline (0.9)  I will check on his labs tomorrow, but if renal function is continuing to improve will  sign off. 02/20/2012,1:43 PM  LOS: 3 days        I

## 2012-02-20 NOTE — Progress Notes (Addendum)
Subjective:   Hiccups. Some abdominal soreness. Says he can't understand why he has to be in the hospital laying in bed and watching TV instead of being at home.  Objective  Vital signs in last 24 hours: Filed Vitals:   02/19/12 2223 02/20/12 0210 02/20/12 0630 02/20/12 1005  BP: 139/76 96/53 121/67 120/71  Pulse: 104 92 92 86  Temp: 98.7 F (37.1 C) 98.3 F (36.8 C) 98.5 F (36.9 C) 98.3 F (36.8 C)  TempSrc: Oral Oral Oral Oral  Resp: 18 18 18 18   Height:      Weight:      SpO2: 94% 91% 97% 96%   Weight change:   Intake/Output Summary (Last 24 hours) at 02/20/12 1140 Last data filed at 02/20/12 0900  Gross per 24 hour  Intake 1535.83 ml  Output   2050 ml  Net -514.17 ml    Physical Exam:  General Exam: Looks much better than he did 24-48 hours ago. Pleasant. Hiccups. Respiratory System: Slightly reduced breath sounds in the bases but otherwise clear to auscultation. No increased work of breathing.  Cardiovascular System: First and second heart sounds heard. Regular rate and rhythm. No JVD/murmurs. 1+ pitting bilateral leg edema.  Gastrointestinal System: Abdomen is non distended, soft and normal bowel sounds heard. Nontender. Central Nervous System: Alert and oriented to person and place but not to time. No focal neurological deficits. Extremities: symmetric 5/5 power. 1+ pitting bipedal edema.  Labs:  Basic Metabolic Panel:  Lab 02/20/12 1610 02/19/12 0410 02/18/12 0929  NA 133* 130* 125*  K 3.5 4.1 5.2*  CL 97 94* 85*  CO2 28 28 22   GLUCOSE 134* 89 101*  BUN 55* 69* 72*  CREATININE 2.68* 3.42* 3.78*  CALCIUM 7.9* 8.7 10.0  ALB -- -- --  PHOS -- -- --   Liver Function Tests:  Lab 02/19/12 1324 02/17/12 2342  AST 27 38*  ALT 12 20  ALKPHOS 44 53  BILITOT 0.2* 0.2*  PROT 4.4* 5.7*  ALBUMIN 2.2* 2.9*    Lab 02/20/12 0500 02/19/12 0410 02/18/12 0929  LIPASE 280* 1426* 599*  AMYLASE -- -- --    Lab 02/19/12 1206  AMMONIA 22   CBC:  Lab  02/20/12 0500 02/19/12 0410 02/18/12 0929 02/17/12 2342  WBC 5.8 5.4 10.3 --  NEUTROABS -- -- -- --  HGB 8.1* 8.2* 8.3* --  HCT 22.8* 23.0* 23.1* --  MCV 94.6 93.9 94.3 95.2  PLT 168 203 244 --   Cardiac Enzymes: No results found for this basename: CKTOTAL:5,CKMB:5,CKMBINDEX:5,TROPONINI:5 in the last 168 hours CBG:  Lab 02/20/12 0658 02/19/12 2217 02/19/12 1656 02/19/12 1137 02/19/12 0717  GLUCAP 139* 98 124* 106* 100*    Iron Studies:   Basename 02/19/12 1324  IRON 11*  TIBC 163*  TRANSFERRIN --  FERRITIN 399*   Studies/Results: Ct Head Wo Contrast  02/19/2012  *RADIOLOGY REPORT*  Clinical Data: Altered mental status.  Ethanol use.  CT HEAD WITHOUT CONTRAST  Technique:  Contiguous axial images were obtained from the base of the skull through the vertex without contrast.  Comparison: None.  Findings: Slightly prominent fourth ventricle noted, without apparent underlying mass lesion or asymmetry.  This is thought to be incidental.  The cerebellum appears unremarkable.  The cerebral peduncles and thalami appear normal.  The basal ganglia likewise appear unremarkable.  The lateral ventricles appear within normal limits.  No intracranial hemorrhage, mass lesion, or acute CVA is observed.  IMPRESSION:  1.  No significant intracranial  abnormality is observed to explain the patient's altered mental status.  Original Report Authenticated By: Dellia Cloud, M.D.   US Renal  02/18/2012  *RADIOLOGY REPORT*  Clinical Data: Acute renal failure.  RENAL/URINARY TRACT ULTRASOUND COMPLETE  Comparison:  CT scan 05/02/2011.  Findings:  Right Kidney:  12.6 cm in length. Normal renal cortical thickness and echogenicity without focal lesions or hydronephrosis.  Left Kidney:  12.7 cm in length. Normal renal cortical thickness and echogenicity without focal lesions or hydronephrosis.  Bladder:  Normal  IMPRESSION: Unremarkable renal ultrasound examination.  Original Report Authenticated By: P. Loralie Champagne, M.D.   Medications:    . sodium chloride 50 mL/hr at 02/20/12 1017      . enoxaparin  40 mg Subcutaneous Q24H  . folic acid  1 mg Oral Daily  . insulin aspart  0-5 Units Subcutaneous QHS  . insulin aspart  0-9 Units Subcutaneous TID WC  . mulitivitamin with minerals  1 tablet Oral Daily  . pantoprazole (PROTONIX) IV  40 mg Intravenous Q24H  . sodium chloride  3 mL Intravenous Q12H  . thiamine  100 mg Oral Daily   Or  . thiamine  100 mg Intravenous Daily  . vancomycin  1,000 mg Intravenous Q24H    I  have reviewed scheduled and prn medications.     Problem/Plan: Principal Problem:  *ARF (acute renal failure) Active Problems:  Acute renal failure  Hyperkalemia  Nausea and vomiting  Diarrhea  Leukocytosis  Anemia  Lactic acidosis  1. Nausea, vomiting and diarrhea: no sickly contacts/recent antibiotic exposure. ? Acute viral GE. No BM since admission. Resolved. Unable to send stool for C. difficile PCR. 2. Hyponatremic dehydration 2/2 # 1: Improving. IV fluids were reduced secondary to possible hypoxia. 3. Acute renal Failure: creat in sept 2012 was 0.9. Probably multifactorial- dehydration, diuretics, ACEI and possible ATN from hypotension. Hold culprit meds. Non-oliguric. No obstruction on renal ultrasound. Nephrology input appreciated. Improving. Continue current management. 4. Hyperkalemia: 2/2 toARF and ACEI. Resolved. 5. Anemia: ? Etiology. No overt bleeding. Check FOBT.Stable. Transfuse if hemoglobin is less than 7 g/dL. B12 deficiency  deficiency. Start B12 supplementation subcutaneously. Hemoglobin dropped from 12.5 in July 2012 2 8 g/dL during this admission-no clear history of GI bleed available. 6. DM 2: SSI. Hold Metformin. Reasonable inpatient control. 7. HTN: controlled. 8. Painful swallowing: ? Prior h/o esophageal erosion: PPI. [facility] gastroenterology input appreciated. Continue PPIs. 9. Pancreatitis, most likely alcoholic. LFTs  unremarkable.Advance diet as tolerated. Lipase decreasing. Consider CT of the abdomen once his acute renal failure has resolved. 10. Alcohol dependence: Monitor for alcohol withdrawal. Continue Ativan protocol and multivitamins. No overt withdrawal features at this time. 11. Encephalopathy: Probably multifactorial secondary to? Early alcohol withdrawal, acute renal failure. Ammonia levels normal. CT head negative for acute findings. Improving. 12. Anasarca: Secondary to poor nutrition and hypoalbuminemia, now complicated by IV fluids. 13. 1 of 2 blood cultures positive for coagulase-negative Staphylococcus. Likely a contaminant. Surveillance blood cultures are negative to date. Discontinue IV vancomycin. 14. GERD: Continue PPIs. 15. Hiccups: ? 2/2 ARF. Monitor.  Will mobilize the patient and get PT OT evaluation.  Discussed with patients daughter/health care power of attorney Ms. Cyndi Lennert over the phone and updated patient's care in detail and answered all questions.      Preston Moore 02/20/2012,11:40 AM  LOS: 3 days

## 2012-02-21 DIAGNOSIS — E860 Dehydration: Secondary | ICD-10-CM

## 2012-02-21 DIAGNOSIS — R197 Diarrhea, unspecified: Secondary | ICD-10-CM

## 2012-02-21 DIAGNOSIS — N179 Acute kidney failure, unspecified: Secondary | ICD-10-CM

## 2012-02-21 DIAGNOSIS — R112 Nausea with vomiting, unspecified: Secondary | ICD-10-CM

## 2012-02-21 LAB — COMPREHENSIVE METABOLIC PANEL
ALT: 14 U/L (ref 0–53)
AST: 25 U/L (ref 0–37)
Albumin: 2.2 g/dL — ABNORMAL LOW (ref 3.5–5.2)
Alkaline Phosphatase: 72 U/L (ref 39–117)
BUN: 41 mg/dL — ABNORMAL HIGH (ref 6–23)
Chloride: 97 mEq/L (ref 96–112)
Potassium: 3.5 mEq/L (ref 3.5–5.1)
Sodium: 132 mEq/L — ABNORMAL LOW (ref 135–145)
Total Bilirubin: 0.4 mg/dL (ref 0.3–1.2)
Total Protein: 5 g/dL — ABNORMAL LOW (ref 6.0–8.3)

## 2012-02-21 LAB — CBC
HCT: 24.3 % — ABNORMAL LOW (ref 39.0–52.0)
Hemoglobin: 8.7 g/dL — ABNORMAL LOW (ref 13.0–17.0)
MCH: 33.7 pg (ref 26.0–34.0)
MCHC: 35.8 g/dL (ref 30.0–36.0)
MCV: 94.2 fL (ref 78.0–100.0)
RDW: 13.4 % (ref 11.5–15.5)

## 2012-02-21 LAB — GLUCOSE, CAPILLARY
Glucose-Capillary: 183 mg/dL — ABNORMAL HIGH (ref 70–99)
Glucose-Capillary: 236 mg/dL — ABNORMAL HIGH (ref 70–99)
Glucose-Capillary: 198 mg/dL — ABNORMAL HIGH (ref 70–99)

## 2012-02-21 LAB — IRON AND TIBC: Saturation Ratios: 7 % — ABNORMAL LOW (ref 20–55)

## 2012-02-21 LAB — LIPASE, BLOOD: Lipase: 159 U/L — ABNORMAL HIGH (ref 11–59)

## 2012-02-21 MED ORDER — PANTOPRAZOLE SODIUM 40 MG IV SOLR
40.0000 mg | Freq: Two times a day (BID) | INTRAVENOUS | Status: DC
  Administered 2012-02-21: 40 mg via INTRAVENOUS
  Filled 2012-02-21 (×3): qty 40

## 2012-02-21 MED ORDER — FERROUS SULFATE 325 (65 FE) MG PO TABS
325.0000 mg | ORAL_TABLET | Freq: Two times a day (BID) | ORAL | Status: DC
  Administered 2012-02-21 – 2012-02-22 (×2): 325 mg via ORAL
  Filled 2012-02-21 (×4): qty 1

## 2012-02-21 MED ORDER — SUCRALFATE 1 GM/10ML PO SUSP
1.0000 g | Freq: Three times a day (TID) | ORAL | Status: DC
Start: 2012-02-21 — End: 2012-02-22
  Administered 2012-02-21 – 2012-02-22 (×4): 1 g via ORAL
  Filled 2012-02-21 (×7): qty 10

## 2012-02-21 NOTE — Progress Notes (Signed)
Inpatient Diabetes Program Recommendations  AACE/ADA: New Consensus Statement on Inpatient Glycemic Control (2009)  Target Ranges:  Prepandial:   less than 140 mg/dL      Peak postprandial:   less than 180 mg/dL (1-2 hours)      Critically ill patients:  140 - 180 mg/dL   Reason for Visit: Hyperglycemia  Results for Preston Moore, Preston Moore (MRN [id number]) as of 02/21/2012 15:14  Ref. Range 02/20/2012 06:58 02/20/2012 11:23 02/20/2012 17:18 02/20/2012 21:11 02/21/2012 07:45 02/21/2012 11:38  Glucose-Capillary Latest Range: 70-99 mg/dL 829 (H) 562 (H) 130 (H) 219 (H) 183 (H) 236 (H)    Inpatient Diabetes Program Recommendations Insulin - Basal: Addd Lantus 8 units QHS HgbA1C: Updated HgbA1C - last one 09/19/2010 - 5.7%  Note: Pt on Lantus 10 QHS at home plus Novolog correction insulin.  Will follow.

## 2012-02-21 NOTE — Evaluation (Signed)
Occupational Therapy Evaluation Patient Details Name: UNKNOWN SCHLEYER MRN: [id number] DOB: 1934-07-10 Today's Date: 02/21/2012 Time: 4098-1191 OT Time Calculation (min): 17 min  OT Assessment / Plan / Recommendation Clinical Impression  Pt admitted for acute renal failure, pancreatitis, AMS  and weakness.  Pt with disorientation and confusion.  Pt is maximally dependent in bed mobility and unable to stand with 1 person assist.  Pt requires min to total assist with ADL.  Recommend SNF for rehab upon d/c.  Will follow acutely.    OT Assessment  Patient needs continued OT Services    Follow Up Recommendations  Skilled nursing facility    Equipment Recommendations  Defer to next venue    Frequency Min 1X/week    Precautions / Restrictions Precautions Precautions: Fall Restrictions Weight Bearing Restrictions: No        ADL  Eating/Feeding: Performed;Set up Where Assessed - Eating/Feeding: Bed level Grooming: Performed;Wash/dry face;Brushing hair;Minimal assistance Where Assessed - Grooming: Unsupported sitting Upper Body Bathing: Simulated;Moderate assistance Where Assessed - Upper Body Bathing: Sitting, bed Lower Body Bathing: Simulated;+1 Total assistance Where Assessed - Lower Body Bathing: Sitting, bed Upper Body Dressing: Simulated;Moderate assistance Where Assessed - Upper Body Dressing: Sitting, bed Lower Body Dressing: Performed;+1 Total assistance Where Assessed - Lower Body Dressing: Sitting, bed Equipment Used: Gait belt ADL Comments: Attempted sit to stand from elevated bed x 2 without success.    OT Goals Acute Rehab OT Goals OT Goal Formulation: With patient Time For Goal Achievement: 03/06/12 Potential to Achieve Goals: Fair ADL Goals Pt Will Perform Grooming: with set-up;Sitting, edge of bed ADL Goal: Grooming - Progress: Goal set today Pt Will Perform Upper Body Bathing: with min assist;Sitting, edge of bed ADL Goal: Upper Body Bathing - Progress: Goal  set today Pt Will Perform Lower Body Bathing: Sitting, edge of bed;Sit to stand from bed;with max assist ADL Goal: Lower Body Bathing - Progress: Goal set today Pt Will Perform Upper Body Dressing: with set-up;Sitting, bed ADL Goal: Upper Body Dressing - Progress: Goal set today Pt Will Perform Lower Body Dressing: with max assist;Sitting, bed;Sit to stand from bed ADL Goal: Lower Body Dressing - Progress: Goal set today Pt Will Transfer to Toilet: with mod assist;Stand pivot transfer;3-in-1 ADL Goal: Toilet Transfer - Progress: Goal set today Miscellaneous OT Goals Miscellaneous OT Goal #1: Pt will perform supine to sit with min assist and use of rail in prep for ADL at EOB. OT Goal: Miscellaneous Goal #1 - Progress: Goal set today Miscellaneous OT Goal #2: Pt will be oriented to place and time using environmental cues. OT Goal: Miscellaneous Goal #2 - Progress: Goal set today  Visit Information  Last OT Received On: 02/21/12 Assistance Needed: +2    Subjective Data  Subjective: "I am going home." Patient Stated Goal: Return home with assist of caregiver.   Prior Functioning  Home Living Lives With: Alone Available Help at Discharge: Personal care attendant (2x a week) Type of Home: House Home Access: Stairs to enter [employer] of Steps: 5 Entrance Stairs-Rails: Right Home Layout: One level Firefighter: Standard Home Adaptive Equipment: Walker - rolling;Straight cane Prior Function Level of Independence: Needs assistance Needs Assistance: Bathing;Dressing Bath: Maximal Dressing: Moderate Communication Communication: No difficulties Dominant Hand: Right    Cognition  Overall Cognitive Status: Impaired Area of Impairment: Attention;Memory;Safety/judgement;Awareness of deficits;Problem solving Arousal/Alertness: Awake/alert Orientation Level: Disoriented to;Place;Time;Situation Behavior During Session: Flat affect Current Attention Level:  Selective Safety/Judgement: Decreased safety judgement for tasks assessed;Decreased awareness of need for  assistance Cognition - Other Comments: pt stated he would be able to manage at home despite inability to stand.    Extremity/Trunk Assessment Right Upper Extremity Assessment RUE ROM/Strength/Tone: Within functional levels Left Upper Extremity Assessment LUE ROM/Strength/Tone: Within functional levels   Mobility Bed Mobility Bed Mobility: Rolling Right;Right Sidelying to Sit;Sitting - Scoot to [employer] of Bed;Sit to Supine;Scooting to [facility] Rolling Right: 2: Max assist;With rail Right Sidelying to Sit: HOB elevated;HOB flat;2: Max assist Sitting - Scoot to [employer] of Bed: 2: Max assist;With rail Sit to Supine: 2: Max assist;With rail;HOB flat Scooting to HOB: 1: +2 Total assist Scooting to [facility]: Patient Percentage: 20% Transfers Transfers: Not assessed (attempted x 2 without success)       Balance Static Sitting Balance Static Sitting - Level of Assistance: 4: Min assist Static Sitting - Comment/# of Minutes: 7  End of Session OT - End of Session Activity Tolerance: Patient limited by fatigue Patient left: in bed;with family/visitor present   Evern Bio 02/21/2012, 11:01 AM [phone]

## 2012-02-21 NOTE — Progress Notes (Signed)
Subjective: He would prefer to go home instead of a SNF. No abdominal pain today.  Objective: Vital signs in last 24 hours: Temp:  [97.8 F (36.6 C)-99.1 F (37.3 C)] 98.5 F (36.9 C) (05/08 0654) Pulse Rate:  [86-105] 99  (05/08 0654) Resp:  [18] 18  (05/08 0654) BP: (112-144)/(60-84) 129/84 mmHg (05/08 0654) SpO2:  [93 %-98 %] 97 % (05/08 0654) Weight change:  Last BM Date: 02/21/12  Intake/Output from previous day: 05/07 0701 - 05/08 0700 In: 1179.5 [P.O.:600; I.V.:579.5] Out: 1300 [Urine:1300]     Physical Exam: General: Alert, awake, pleasant, in no acute distress. HEENT: No bruits, no goiter. Heart: Regular rate and rhythm, without murmurs, rubs, gallops. Lungs: Clear to auscultation bilaterally. Abdomen: Soft, nontender, nondistended, positive bowel sounds. Extremities: 1+ edema bilaterally.    Lab Results: Basic Metabolic Panel:  Basename 02/21/12 0459 02/20/12 0500 02/19/12 1300  NA 132* 133* --  K 3.5 3.5 --  CL 97 97 --  CO2 25 28 --  GLUCOSE 181* 134* --  BUN 41* 55* --  CREATININE 1.87* 2.68* --  CALCIUM 7.6* 7.9* --  MG -- -- 1.6  PHOS -- -- --   Liver Function Tests:  Basename 02/21/12 0459 02/19/12 1324  AST 25 27  ALT 14 12  ALKPHOS 72 44  BILITOT 0.4 0.2*  PROT 5.0* 4.4*  ALBUMIN 2.2* 2.2*    Basename 02/21/12 0459 02/20/12 0500  LIPASE 159* 280*  AMYLASE -- --    Basename 02/19/12 1206  AMMONIA 22   CBC:  Basename 02/21/12 0459 02/20/12 0500  WBC 8.2 5.8  NEUTROABS -- --  HGB 8.7* 8.1*  HCT 24.3* 22.8*  MCV 94.2 94.6  PLT 217 168   CBG:  Basename 02/21/12 0745 02/20/12 2111 02/20/12 1718 02/20/12 1123 02/20/12 0658 02/19/12 2217  GLUCAP 183* 219* 231* 204* 139* 98   Thyroid Function Tests:  Basename 02/19/12 1300  TSH 2.236  T4TOTAL --  FREET4 --  T3FREE --  THYROIDAB --   Anemia Panel:  Basename 02/20/12 0500 02/19/12 1324  VITAMINB12 -- 110*  FOLATE -- 5.6  FERRITIN 359* --  TIBC 146* --  IRON 10*  --  RETICCTPCT -- 2.4   Coagulation:  Basename 02/19/12 1324  LABPROT 12.7  INR 0.93    Recent Results (from the past 240 hour(s))  CULTURE, BLOOD (ROUTINE X 2)     Status: Normal   Collection Time   02/18/12  3:43 AM      Component Value Range Status Comment   Specimen Description BLOOD RIGHT ANTECUBITAL   Final    Special Requests BOTTLES DRAWN AEROBIC AND ANAEROBIC [facility]   Final    Culture  Setup Time [id number]   Final    Culture     Final    Value: STAPHYLOCOCCUS SPECIES (COAGULASE NEGATIVE)     Note: THE SIGNIFICANCE OF ISOLATING THIS ORGANISM FROM A SINGLE SET OF BLOOD CULTURES WHEN MULTIPLE SETS ARE DRAWN IS UNCERTAIN. PLEASE NOTIFY THE MICROBIOLOGY DEPARTMENT WITHIN ONE WEEK IF SPECIATION AND SENSITIVITIES ARE REQUIRED.     Note: Gram Stain Report Called to,Read Back By and Verified With: JACQUELINE BIGPEN 02/19/12 1140 BY SMITHERSJ   Report Status 02/20/2012 FINAL   Final   CULTURE, BLOOD (ROUTINE X 2)     Status: Normal (Preliminary result)   Collection Time   02/18/12  3:45 AM      Component Value Range Status Comment   Specimen Description BLOOD RIGHT HAND   Final  Special Requests BOTTLES DRAWN AEROBIC ONLY 3CC   Final    Culture  Setup Time [id number]   Final    Culture     Final    Value:        BLOOD CULTURE RECEIVED NO GROWTH TO DATE CULTURE WILL BE HELD FOR 5 DAYS BEFORE ISSUING A FINAL NEGATIVE REPORT   Report Status PENDING   Incomplete   CULTURE, BLOOD (ROUTINE X 2)     Status: Normal (Preliminary result)   Collection Time   02/19/12  1:15 PM      Component Value Range Status Comment   Specimen Description BLOOD LEFT ARM   Final    Special Requests BOTTLES DRAWN AEROBIC ONLY 2CC   Final    Culture  Setup Time [id number]   Final    Culture     Final    Value:        BLOOD CULTURE RECEIVED NO GROWTH TO DATE CULTURE WILL BE HELD FOR 5 DAYS BEFORE ISSUING A FINAL NEGATIVE REPORT   Report Status PENDING   Incomplete   CULTURE, BLOOD (ROUTINE X 2)     Status:  Normal (Preliminary result)   Collection Time   02/19/12  1:24 PM      Component Value Range Status Comment   Specimen Description BLOOD RIGHT HAND   Final    Special Requests BOTTLES DRAWN AEROBIC AND ANAEROBIC 5CC   Final    Culture  Setup Time [id number]   Final    Culture     Final    Value:        BLOOD CULTURE RECEIVED NO GROWTH TO DATE CULTURE WILL BE HELD FOR 5 DAYS BEFORE ISSUING A FINAL NEGATIVE REPORT   Report Status PENDING   Incomplete     Studies/Results: Ct Head Wo Contrast  02/19/2012  *RADIOLOGY REPORT*  Clinical Data: Altered mental status.  Ethanol use.  CT HEAD WITHOUT CONTRAST  Technique:  Contiguous axial images were obtained from the base of the skull through the vertex without contrast.  Comparison: None.  Findings: Slightly prominent fourth ventricle noted, without apparent underlying mass lesion or asymmetry.  This is thought to be incidental.  The cerebellum appears unremarkable.  The cerebral peduncles and thalami appear normal.  The basal ganglia likewise appear unremarkable.  The lateral ventricles appear within normal limits.  No intracranial hemorrhage, mass lesion, or acute CVA is observed.  IMPRESSION:  1.  No significant intracranial abnormality is observed to explain the patient's altered mental status.  Original Report Authenticated By: Dellia Cloud, M.D.    Medications: Scheduled Meds:   . cyanocobalamin  1,000 mcg Subcutaneous Daily  . enoxaparin  40 mg Subcutaneous Q24H  . ferumoxytol  510 mg Intravenous Once  . folic acid  1 mg Oral Daily  . insulin aspart  0-5 Units Subcutaneous QHS  . insulin aspart  0-9 Units Subcutaneous TID WC  . mulitivitamin with minerals  1 tablet Oral Daily  . pantoprazole (PROTONIX) IV  40 mg Intravenous Q24H  . sodium chloride  3 mL Intravenous Q12H  . thiamine  100 mg Oral Daily   Or  . thiamine  100 mg Intravenous Daily  . DISCONTD: vancomycin  1,000 mg Intravenous Q24H   Continuous Infusions:   . sodium  chloride 10 mL/hr at 02/20/12 1453   PRN Meds:.acetaminophen, acetaminophen, HYDROmorphone (DILAUDID) injection, LORazepam, LORazepam, ondansetron (ZOFRAN) IV, ondansetron, oxyCODONE, zolpidem, DISCONTD:  HYDROmorphone (DILAUDID) injection  Assessment/Plan:  Principal Problem:  *  ARF (acute renal failure) Active Problems:  Acute renal failure  Hyperkalemia  Nausea and vomiting  Diarrhea  Leukocytosis  Anemia  Lactic acidosis   #1 ARF: Presumed 2/2 prerenal causes with his GI losses in addition to the ACE-I. Cr continues to improve. 1.87 today.  #2 N/V/D: Improved. No BM since admission. Likely related to pancreatitis.  #3 Hyperkalemia: 2/2 ARF and ACE-I; resolved.  #4 Iron and B12 deficiency anemia: repleting with B12 injections and IV iron. Start PO FeSO4. No blood transfusions so far.  #5 Pancreatitis: likely alcoholic. Clinically improving. Lipase trending down. Do not think CT abdomen would be high yield at this point unless he were to clinically worsen.  #6 Hiccups: likely related to pancreatitis. If very bothersome may use thorazine.  #7 ETOH dependance: no current signs of withdrawal.  #8 GERD: Continue PPI.  #9 Dispo: PT recommends SNF. Long discussion with daughter Hermine Messick via telephone today. She states that his home situation is not ideal, they cannot provide 24 hour care, patient would not allow HH into the house. He certainly seems confused today and I doubt that he has decision-making capacity. If he refuses SNF placement, may need to proceed with a formal competency evaluation. SW has been alerted. Suspect should be medically ready for DC in 1-2 days.    LOS: 4 days   Preston Moore,[facility] Pager: [phone] 02/21/2012, 10:03 AM

## 2012-02-21 NOTE — Progress Notes (Signed)
Clinical Social Work Department CLINICAL SOCIAL WORK PLACEMENT NOTE 02/21/2012  Patient:  Preston Moore, Preston Moore  Account Number:  [id number] Admit date:  02/17/2012  Clinical Social Worker:  Orpah Greek  Date/time:  02/21/2012 02:18 PM  Clinical Social Work is seeking post-discharge placement for this patient at the following level of care:   SKILLED NURSING   (*CSW will update this form in Epic as items are completed)   02/21/2012  Patient/family provided with [facility] Department of Clinical Social Work's list of facilities offering this level of care within the geographic area requested by the patient (or if unable, by the patient's family).  02/21/2012  Patient/family informed of their freedom to choose among providers that offer the needed level of care, that participate in Medicare, Medicaid or managed care program needed by the patient, have an available bed and are willing to accept the patient.  02/21/2012  Patient/family informed of [facility]' ownership interest in [facility], as well as of the fact that they are under no obligation to receive care at this facility.  PASARR submitted to EDS on 02/21/2012 PASARR number received from EDS on 02/21/2012  FL2 transmitted to all facilities in geographic area requested by pt/family on  02/21/2012 FL2 transmitted to all facilities within larger geographic area on   Patient informed that his/her managed care company has contracts with or will negotiate with  certain facilities, including the following:     Patient/family informed of bed offers received:   Patient chooses bed at  Physician recommends and patient chooses bed at    Patient to be transferred to  on   Patient to be transferred to facility by   The following physician request were entered in Epic:   Additional Comments:    Unice Bailey, [employer] [phone]

## 2012-02-21 NOTE — Progress Notes (Signed)
Clinical Social Work Department BRIEF PSYCHOSOCIAL ASSESSMENT 02/21/2012  Patient:  Preston Moore, Preston Moore     Account Number:  [id number]     Admit date:  02/17/2012  Clinical Social Worker:  Orpah Greek  Date/Time:  02/21/2012 02:13 PM  Referred by:  Physician  Date Referred:  02/21/2012 Referred for  SNF Placement   Other Referral:   Interview type:  Family Other interview type:    PSYCHOSOCIAL DATA Living Status:  ALONE Admitted from facility:   Level of care:   Primary support name:  Cyndi Lennert (daughter) h#: [phone] c#: [phone] Primary support relationship to patient:  CHILD, ADULT Degree of support available:    CURRENT CONCERNS Current Concerns  Post-Acute Placement   Other Concerns:    SOCIAL WORK ASSESSMENT / PLAN CSW spoke with patient's daughters, Berniece Pap re: SNF placement. Noted PT/OT recommended SNF placement. Daughter states that patient had been to [facility] last June.   Assessment/plan status:  Information/Referral to [facility] Other assessment/ plan:   Information/referral to community resources:   CSW completed FL2 and faxed out to [facility]. CSW will follow-up with bed offers when received.    PATIENT'S/FAMILY'S RESPONSE TO PLAN OF CARE: Patient's daughters were very supportive of SNF plan, felt that he had improved greatly by SNF stay last June.        Unice Bailey, LCSWA [phone]

## 2012-02-21 NOTE — Progress Notes (Signed)
Preston Moore 12:15 PM  Subjective: Overall the patient is probably better although he is not as oriented as yesterday and is having some swallowing problems but his pain is better and he is still hiccuping  Objective: Vital signs stable afebrile no acute distress thinks he is at home abdomen is soft nontender decrease BUN and creatinine  Assessment: Pancreatitis secondary to alcohol and reflux  Plan: Continue to await BUN and creatinine to Normalize And then proceed with CT okay to hold endoscopy for now since the 2 last year showed significant reflux disease and he's been off pump inhibitor and will continue to follow and add Carafate and increased pump inhibitors to twice a day and it discussed his case with both daughtersMAGOD,Preston Moore

## 2012-02-22 ENCOUNTER — Emergency Department (HOSPITAL_COMMUNITY): Payer: Medicare Other

## 2012-02-22 ENCOUNTER — Inpatient Hospital Stay (HOSPITAL_COMMUNITY)
Admission: EM | Admit: 2012-02-22 | Discharge: 2012-03-01 | DRG: 389 | Disposition: A | Discharge status: Skilled Nursing Facility | Payer: Medicare Other | Attending: General Surgery | Admitting: General Surgery

## 2012-02-22 DIAGNOSIS — E46 Unspecified protein-calorie malnutrition: Secondary | ICD-10-CM | POA: Diagnosis present

## 2012-02-22 DIAGNOSIS — K219 Gastro-esophageal reflux disease without esophagitis: Secondary | ICD-10-CM | POA: Diagnosis present

## 2012-02-22 DIAGNOSIS — K56609 Unspecified intestinal obstruction, unspecified as to partial versus complete obstruction: Principal | ICD-10-CM | POA: Diagnosis present

## 2012-02-22 DIAGNOSIS — K567 Ileus, unspecified: Secondary | ICD-10-CM | POA: Diagnosis present

## 2012-02-22 DIAGNOSIS — E876 Hypokalemia: Secondary | ICD-10-CM | POA: Diagnosis present

## 2012-02-22 DIAGNOSIS — E119 Type 2 diabetes mellitus without complications: Secondary | ICD-10-CM | POA: Diagnosis present

## 2012-02-22 DIAGNOSIS — R000 Tachycardia, unspecified: Secondary | ICD-10-CM

## 2012-02-22 DIAGNOSIS — R627 Adult failure to thrive: Secondary | ICD-10-CM | POA: Diagnosis present

## 2012-02-22 DIAGNOSIS — N179 Acute kidney failure, unspecified: Secondary | ICD-10-CM | POA: Diagnosis present

## 2012-02-22 DIAGNOSIS — E860 Dehydration: Secondary | ICD-10-CM

## 2012-02-22 DIAGNOSIS — E538 Deficiency of other specified B group vitamins: Secondary | ICD-10-CM

## 2012-02-22 DIAGNOSIS — D509 Iron deficiency anemia, unspecified: Secondary | ICD-10-CM

## 2012-02-22 DIAGNOSIS — K859 Acute pancreatitis without necrosis or infection, unspecified: Secondary | ICD-10-CM

## 2012-02-22 DIAGNOSIS — Z91199 Patient's noncompliance with other medical treatment and regimen due to unspecified reason: Secondary | ICD-10-CM

## 2012-02-22 DIAGNOSIS — K560 Paralytic ileus: Secondary | ICD-10-CM | POA: Diagnosis present

## 2012-02-22 DIAGNOSIS — E66811 Obesity, class 1: Secondary | ICD-10-CM | POA: Diagnosis present

## 2012-02-22 DIAGNOSIS — N289 Disorder of kidney and ureter, unspecified: Secondary | ICD-10-CM

## 2012-02-22 DIAGNOSIS — R112 Nausea with vomiting, unspecified: Secondary | ICD-10-CM

## 2012-02-22 DIAGNOSIS — Z9119 Patient's noncompliance with other medical treatment and regimen: Secondary | ICD-10-CM

## 2012-02-22 DIAGNOSIS — E669 Obesity, unspecified: Secondary | ICD-10-CM | POA: Diagnosis present

## 2012-02-22 DIAGNOSIS — R197 Diarrhea, unspecified: Secondary | ICD-10-CM

## 2012-02-22 DIAGNOSIS — R5381 Other malaise: Secondary | ICD-10-CM

## 2012-02-22 DIAGNOSIS — D531 Other megaloblastic anemias, not elsewhere classified: Secondary | ICD-10-CM | POA: Diagnosis present

## 2012-02-22 HISTORY — DX: Acute pancreatitis without necrosis or infection, unspecified: K85.90

## 2012-02-22 HISTORY — DX: Ileus, unspecified: K56.7

## 2012-02-22 HISTORY — DX: Unspecified protein-calorie malnutrition: E46

## 2012-02-22 HISTORY — DX: Patient's noncompliance with other medical treatment and regimen: Z91.19

## 2012-02-22 HISTORY — DX: Deficiency of other specified B group vitamins: E53.8

## 2012-02-22 LAB — CBC
HCT: 25.7 % — ABNORMAL LOW (ref 39.0–52.0)
MCHC: 35.4 g/dL (ref 30.0–36.0)
RDW: 13.7 % (ref 11.5–15.5)

## 2012-02-22 LAB — BASIC METABOLIC PANEL
BUN: 34 mg/dL — ABNORMAL HIGH (ref 6–23)
CO2: 24 mEq/L (ref 19–32)
Creatinine, Ser: 1.45 mg/dL — ABNORMAL HIGH (ref 0.50–1.35)
GFR calc Af Amer: 53 mL/min — ABNORMAL LOW (ref >90)
GFR calc non Af Amer: 46 mL/min — ABNORMAL LOW (ref >90)
Potassium: 3.9 mEq/L (ref 3.5–5.1)

## 2012-02-22 LAB — GLUCOSE, CAPILLARY: Glucose-Capillary: 187 mg/dL — ABNORMAL HIGH (ref 70–99)

## 2012-02-22 LAB — OCCULT BLOOD, POC DEVICE: Fecal Occult Bld: POSITIVE

## 2012-02-22 MED ORDER — FOLIC ACID 1 MG PO TABS: 1.0000 mg | ORAL_TABLET | Freq: Every day | ORAL | Status: AC

## 2012-02-22 MED ORDER — FERROUS SULFATE 325 (65 FE) MG PO TABS: 325.0000 mg | ORAL_TABLET | Freq: Two times a day (BID) | ORAL | Status: DC

## 2012-02-22 MED ORDER — THIAMINE HCL 100 MG PO TABS: 100.0000 mg | ORAL_TABLET | Freq: Every day | ORAL | Status: AC

## 2012-02-22 MED ORDER — SUCRALFATE 1 GM/10ML PO SUSP: 1.0000 g | Freq: Three times a day (TID) | ORAL | Status: DC

## 2012-02-22 MED ORDER — ADULT MULTIVITAMIN W/MINERALS CH: 1.0000 | ORAL_TABLET | Freq: Every day | ORAL | Status: DC

## 2012-02-22 MED ORDER — ONDANSETRON HCL 4 MG/2ML IJ SOLN
4.0000 mg | Freq: Once | INTRAMUSCULAR | Status: AC
  Administered 2012-02-22: 4 mg via INTRAVENOUS
  Filled 2012-02-22: qty 2

## 2012-02-22 MED ORDER — PANTOPRAZOLE SODIUM 40 MG PO TBEC
40.0000 mg | DELAYED_RELEASE_TABLET | Freq: Two times a day (BID) | ORAL | Status: DC
  Administered 2012-02-22: 40 mg via ORAL
  Filled 2012-02-22: qty 1

## 2012-02-22 MED ORDER — CYANOCOBALAMIN 1000 MCG/ML IJ SOLN: 1000.0000 ug | Freq: Every day | INTRAMUSCULAR | Status: AC

## 2012-02-22 MED ORDER — OXYCODONE HCL 5 MG PO TABS: 5.0000 mg | ORAL_TABLET | ORAL | Status: DC | PRN

## 2012-02-22 MED ORDER — PANTOPRAZOLE SODIUM 40 MG PO TBEC: 40.0000 mg | DELAYED_RELEASE_TABLET | Freq: Two times a day (BID) | ORAL | Status: DC

## 2012-02-22 NOTE — Progress Notes (Signed)
Physical Therapy Treatment Patient Details Name: Preston Moore MRN: [id number] DOB: July 02, 1934 Today's Date: 02/22/2012 Time: 0850-0903 PT Time Calculation (min): 13 min  PT Assessment / Plan / Recommendation Comments on Treatment Session  Improvement in participation/motivation, tolerance compared to eval. Pt still very deconditioned. Continue to recommend SNF for rehab    Follow Up Recommendations  Skilled nursing facility    Equipment Recommendations  Defer to next venue    Frequency Min 3X/week   Plan Discharge plan remains appropriate    Precautions / Restrictions Precautions Precautions: Fall Restrictions Weight Bearing Restrictions: No   Pertinent Vitals/Pain     Mobility  Bed Mobility Bed Mobility: Supine to Sit Supine to Sit: 1: +2 Total assist Supine to Sit: Patient Percentage: 50% Details for Bed Mobility Assistance: Multimodal cues for safety, technique, hand placement. Assist for bil LEs off bed and trunk to upright. Utilized bedpad for scooting, positioning Transfers Transfers: Sit to Stand;Stand to [employer] Transfers Sit to Stand: From bed;1: +2 Total assist;From elevated surface;With upper extremity assist Sit to Stand: Patient Percentage: 70% Stand to Sit: To chair/3-in-1;1: +2 Total assist;With upper extremity assist;With armrests Stand to Sit: Patient Percentage: 70% Stand Pivot Transfers: 1: +2 Total assist Stand Pivot Transfers: Patient Percentage: 70% Details for Transfer Assistance: Multimodal cues for safety, technique, hand placement. Assist to rise, control descent. Bed>recliner with RW. Increased time. Assist to move R LE intermittently. Ambulation/Gait Ambulation/Gait Assistance: Not tested (comment)    Exercises     PT Goals Acute Rehab PT Goals PT Goal: Supine/Side to Sit - Progress: Progressing toward goal PT Goal: Sit to Stand - Progress: Progressing toward goal PT Transfer Goal: Bed to Chair/Chair to Bed - Progress: Progressing  toward goal  Visit Information  Last PT Received On: 02/22/12 Assistance Needed: +2    Subjective Data  Subjective: "Oh man.Marland KitchenMarland KitchenMarland KitchenI'm sitting up" Patient Stated Goal: None stated   Cognition  Overall Cognitive Status: Impaired Area of Impairment: Attention;Memory;Awareness of deficits;Problem solving Arousal/Alertness: Awake/alert Orientation Level: Disoriented to;Place;Time;Situation Behavior During Session: Flat affect Current Attention Level: Selective Safety/Judgement: Decreased awareness of need for assistance;Decreased safety judgement for tasks assessed    Balance     End of Session PT - End of Session Equipment Utilized During Treatment: Gait belt Activity Tolerance: Patient limited by fatigue Patient left: in chair;with call bell/phone within reach;with chair alarm set    Preston Moore 02/22/2012, 10:05 AM [phone]

## 2012-02-22 NOTE — ED Notes (Signed)
Respiratory called to get labs arterially.

## 2012-02-22 NOTE — Discharge Summary (Addendum)
Physician Discharge Summary  Patient ID: Preston Moore MRN: [id number] DOB/AGE: [birth year] 76 y.o.  Admit date: 02/17/2012 Discharge date: 02/22/2012  Primary Care Physician:  Berlinda Last, DO   Discharge Diagnoses:    Principal Problem:  *ARF (acute renal failure) Active Problems:  Acute renal failure  Hyperkalemia  Nausea and vomiting  Diarrhea  Iron deficiency anemia  Lactic acidosis  Pancreatitis  Physical deconditioning  B12 deficiency  Esophageal reflux disease    Medication List  As of 02/22/2012  9:36 AM   STOP taking these medications         Cinnamon 500 MG Tabs      enalapril 2.5 MG tablet      furosemide 20 MG tablet      gabapentin 300 MG capsule      HYDROcodone-acetaminophen 7.5-750 MG per tablet      metFORMIN 1000 MG tablet      metoprolol tartrate 25 MG tablet      NOVOLOG 100 UNIT/ML injection      simvastatin 20 MG tablet      traZODone 100 MG tablet         TAKE these medications         cyanocobalamin 1000 MCG/ML injection   Commonly known as: (VITAMIN B-12)   Inject 1 mL (1,000 mcg total) into the skin daily.      ferrous sulfate 325 (65 FE) MG tablet   Take 1 tablet (325 mg total) by mouth 2 (two) times daily with a meal.      folic acid 1 MG tablet   Commonly known as: FOLVITE   Take 1 tablet (1 mg total) by mouth daily.      insulin glargine 100 UNIT/ML injection   Commonly known as: LANTUS   Inject 10 Units into the skin at bedtime.      mulitivitamin with minerals Tabs   Take 1 tablet by mouth daily.      oxyCODONE 5 MG immediate release tablet   Commonly known as: Oxy IR/ROXICODONE   Take 1 tablet (5 mg total) by mouth every 4 (four) hours as needed.      pantoprazole 40 MG tablet   Commonly known as: PROTONIX   Take 1 tablet (40 mg total) by mouth 2 (two) times daily before a meal.      sucralfate 1 GM/10ML suspension   Commonly known as: CARAFATE   Take 10 mLs (1 g total) by mouth 4 (four) times daily -   with meals and at bedtime.      thiamine 100 MG tablet   Take 1 tablet (100 mg total) by mouth daily.             Disposition and Follow-up:  Patient will be discharged to skilled nursing facility today in stable condition. Will need followup BMET in about 3 days to follow renal function. Once his creatinine has stabilized, then he needs to have a CT abdomen and pelvis with contrast to evaluate pancreas, followed by an appointment with Dr. Leary Roca.  Consults:  GI Dr. Ewing Schlein  Significant Diagnostic Studies:  Dg Chest 1 View  02/18/2012  *RADIOLOGY REPORT*  Clinical Data: Shortness of breath and weakness; failure to thrive.  CHEST - 1 VIEW  Comparison: Chest radiograph performed 03/28/2011  Findings: The lungs are well-aerated.  Minimal left basilar opacity may reflect atelectasis or possibly pneumonia.  There is no evidence of pleural effusion or pneumothorax.  Mild vascular congestion is noted.  The cardiomediastinal  silhouette is borderline normal in size; calcification is seen within the aortic arch.  No acute osseous abnormalities are seen.  IMPRESSION: Mild vascular congestion noted, without significant pulmonary edema.  Minimal left basilar opacity may reflect atelectasis or possibly pneumonia.  Original Report Authenticated By: Tonia Ghent, M.D.   US Renal  02/18/2012  *RADIOLOGY REPORT*  Clinical Data: Acute renal failure.  RENAL/URINARY TRACT ULTRASOUND COMPLETE  Comparison:  CT scan 05/02/2011.  Findings:  Right Kidney:  12.6 cm in length. Normal renal cortical thickness and echogenicity without focal lesions or hydronephrosis.  Left Kidney:  12.7 cm in length. Normal renal cortical thickness and echogenicity without focal lesions or hydronephrosis.  Bladder:  Normal  IMPRESSION: Unremarkable renal ultrasound examination.  Original Report Authenticated By: P. Loralie Champagne, M.D.   Brief H and P: For complete details please refer to admission H and P, but in brief patient is a 78  y.o. male who presents to the ED with complaints of severe weakness after 3-4 days of Nausea, vomiting and diarrhea. He also has complaints of burning esophageal pain from his vomiting. He denies any fevers or chills, and he denies having any bloody stools. He reports not being able to hold down any foods or liquids. In the ED, his laboratory studies revealed multiple abnormalities, with an elevated bun/cr of /713.5 and his creatinine had previously been 0.9. His potassium was also elevated at 6.3. We were asked to admit him for further evaluation and management.     Hospital Course:  Principal Problem:  *ARF (acute renal failure) Active Problems:  Acute renal failure  Hyperkalemia  Nausea and vomiting  Diarrhea  Iron deficiency anemia  Lactic acidosis  Pancreatitis  Physical deconditioning  B12 deficiency  Esophageal reflux disease   #1 acute renal failure: Improving. Presumed secondary to prerenal causes with his GI losses in addition to ACE inhibitor and diuretic therapy. His creatinine today is 1.45 down from 3.5 on admission. I would recommend rechecking his renal function in about 3 days to assure returns to his baseline. He does not have a history of chronic kidney disease.  #2 nausea/vomiting/diarrhea: Resolved. He has not had any diarrhea since admission. This is likely related to his pancreatitis and esophageal reflux disease.  #3 hyperkalemia: On admission was found to have a potassium of 6.3. Again this is presumed secondary to his acute renal failure and ACE inhibitor. This has resolved by time of discharge.  #4 iron and B12 deficiency anemia: He had a ferritin level of less then 10 and a B12 level of 110. He has been given an IV dose of iron in addition to being discharged on twice daily ferrous sulfate. He will also be on daily B12 injections for 7 days, I would recommend he get monthly B12 repletion for the rest of his life.  #5 pancreatitis: This is likely alcoholic in  nature. This is clinically improving. His lipase has trended down since admission. He is now tolerating some solid foods and is not requiring a whole lot in the way of pain medication. Dr. Ewing Schlein is interested in performing a CT scan of the abdomen to evaluate the pancreas, however we have been unable to perform this given his acute renal failure. After speaking with Dr. Ewing Schlein today, we have decided to discharge him to skilled nursing facility today, have his renal function checked in 3 days and if it has normalized to then proceed with CT scan of abdomen followed by a followup appointment in his office  in about 10 days.  #6 esophageal reflux disease: Dr. Ewing Schlein has performed 2 endoscopies in the past both with findings of hiatal hernia and reflux disease. Patient had not been taking his proton pump inhibitors and had been imbibing significant amounts of alcohol, our suspicion is that his reflux disease is again active. For this reason we have decided that an endoscopy is not urgent at this time and instead have recommended that he resume twice daily proton pump inhibitor therapy as well as Carafate.  #7 alcohol abuse: He has not exhibited signs of withdrawal while in the hospital. He will be discharged on thiamine and folate. He has been counseled on alcohol cessation.   Time spent on Discharge: Greater than 30 minutes.  SignedChaya Jan [facility] Pager: [phone] 02/22/2012, 9:36 AM

## 2012-02-22 NOTE — ED Provider Notes (Signed)
History     CSN: [id number]  Arrival date & time 02/22/12  2157   First MD Initiated Contact with Patient 02/22/12 2217      Chief Complaint  Patient presents with  . Vomiting Feces   . Constipation    (Consider location/radiation/quality/duration/timing/severity/associated sxs/prior treatment) Patient is a 78 y.o. male presenting with constipation. The history is provided by the patient, a relative and medical records. The history is limited by the condition of the patient.  Constipation  Associated symptoms include vomiting.  The pt is a 37 y male who was just discharged from the hospital today.  He had been admitted for eval of n/v/d.  While in the hospital he developed renal failure.   His daughters say he had no  Recovered completely when he was discharged.  They have brought him back for persistent vomiting, which they think contains feces.  He denies pain now.   He has not had a bm for > 1 week.  He has not had prior abd surgery but he does have a hiatal hernia and hx of gi bleed. He is on iron.   He drinks etoh and smokes cigarettes.  Level 5 caveat applies for urgent need for intervention and Severe illness.    Past Medical History  Diagnosis Date  . Atrial flutter   . Diabetes mellitus   . Neuropathy   . Hyperlipidemia   . Syncope and collapse   . Hypertension     Past Surgical History  Procedure Date  . Laminectomy 1965    Family History  Problem Relation Age of Onset  . Hypertension Neg Hx   . Coronary artery disease Neg Hx   . Diabetes Neg Hx     History  Substance Use Topics  . Smoking status: Current Everyday Smoker -- 0.5 packs/day for 10 years    Types: Cigarettes  . Smokeless tobacco: Never Used  . Alcohol Use: 21.6 oz/week    36 Cans of beer per week      Review of Systems  Unable to perform ROS Gastrointestinal: Positive for vomiting and constipation.  level 5 caveat for urgent need for intervention and severe illness.  Allergies  Review  of patient's allergies indicates no known allergies.  Home Medications   Current Outpatient Rx  Name Route Sig Dispense Refill  . CYANOCOBALAMIN 1000 MCG/ML IJ SOLN Subcutaneous Inject 1 mL (1,000 mcg total) into the skin daily. 1 mL 5  . FERROUS SULFATE 325 (65 FE) MG PO TABS Oral Take 1 tablet (325 mg total) by mouth 2 (two) times daily with a meal. 60 tablet 0  . FOLIC ACID 1 MG PO TABS Oral Take 1 tablet (1 mg total) by mouth daily.    . INSULIN GLARGINE 100 UNIT/ML [place] SOLN Subcutaneous Inject 10 Units into the skin at bedtime.    . ADULT MULTIVITAMIN W/MINERALS CH Oral Take 1 tablet by mouth daily.    . OXYCODONE HCL 5 MG PO TABS Oral Take 1 tablet (5 mg total) by mouth every 4 (four) hours as needed. 30 tablet 0  . PANTOPRAZOLE SODIUM 40 MG PO TBEC Oral Take 1 tablet (40 mg total) by mouth 2 (two) times daily before a meal.    . SUCRALFATE 1 GM/10ML PO SUSP Oral Take 10 mLs (1 g total) by mouth 4 (four) times daily -  with meals and at bedtime. 420 mL   . THIAMINE HCL 100 MG PO TABS Oral Take 1 tablet (100 mg total)  by mouth daily.      BP 104/53  Pulse 71  Resp 29  SpO2 95%  Physical Exam  Vitals reviewed. Constitutional: He is oriented to person, place, and time. He appears distressed.       Ill-appearing with emesis basin containing bilious emesis  HENT:  Head: Normocephalic and atraumatic.  Eyes: Conjunctivae are normal.  Neck: Normal range of motion. Neck supple.  Cardiovascular:       Tachycardia  Pulmonary/Chest: Effort normal and breath sounds normal. No respiratory distress.  Abdominal: He exhibits distension. There is tenderness. There is no rebound and no guarding.       Left-sided abdominal tenderness without guarding or rebound.  Decreased bowel sounds  Genitourinary: Guaiac positive stool.       Charcoal black stool.  The patient is taking iron  Musculoskeletal: Normal range of motion.  Neurological: He is alert and oriented to person, place, and time.    Skin: Skin is dry. There is pallor.  Psychiatric: He has a normal mood and affect. Thought content normal.    ED Course  Procedures (including critical care time) 78 year old, male, presents emergency department with nausea, vomiting, tachycardia, and left-sided abdominal tenderness.  Apparently he has not had a bowel movement for greater than a week.  We'll perform an x-ray of the abdomen to look for bowel obstruction, along with laboratory testing.  Presently, he does not want anything for pain, but I will give him an antiemetic  Labs Reviewed - No data to display No results found.   No diagnosis found.  12:05 AM Spoke with dr. Carolynne Edouard. He will come eval pt for sbo  MDM  Abdominal pain, with nausea and bilious emesis, abdominal distention, and tachycardia        Cheri Guppy, MD 02/23/12 0006

## 2012-02-22 NOTE — ED Notes (Signed)
Unable to get labs. RN aware 

## 2012-02-22 NOTE — ED Notes (Signed)
MD Caporossi notified of patient status, MD to bedside

## 2012-02-22 NOTE — ED Notes (Signed)
Bed:WA18<BR> Expected date:<BR> Expected time:<BR> Means of arrival:<BR> Comments:<BR>

## 2012-02-22 NOTE — Progress Notes (Signed)
Patient set to discharge to [facility] today. Daughters at bedside aware. [facility] called for transport.   Clinical Social Work Department CLINICAL SOCIAL WORK PLACEMENT NOTE 02/22/2012  Patient:  Preston Moore, Preston Moore  Account Number:  [id number] Admit date:  02/17/2012  Clinical Social Worker:  Orpah Greek  Date/time:  02/21/2012 02:18 PM  Clinical Social Work is seeking post-discharge placement for this patient at the following level of care:   SKILLED NURSING   (*CSW will update this form in Epic as items are completed)   02/21/2012  Patient/family provided with [facility] Department of Clinical Social Work's list of facilities offering this level of care within the geographic area requested by the patient (or if unable, by the patient's family).  02/21/2012  Patient/family informed of their freedom to choose among providers that offer the needed level of care, that participate in Medicare, Medicaid or managed care program needed by the patient, have an available bed and are willing to accept the patient.  02/21/2012  Patient/family informed of [facility]' ownership interest in [facility], as well as of the fact that they are under no obligation to receive care at this facility.  PASARR submitted to EDS on 02/21/2012 PASARR number received from EDS on 02/21/2012  FL2 transmitted to all facilities in geographic area requested by pt/family on  02/21/2012 FL2 transmitted to all facilities within larger geographic area on   Patient informed that his/her managed care company has contracts with or will negotiate with  certain facilities, including the following:     Patient/family informed of bed offers received:  02/22/2012 Patient chooses bed at [facility] Physician recommends and patient chooses bed at    Patient to be transferred to [facility] on  02/22/2012 Patient to be transferred to facility by  [facility]  The following physician request were entered in Epic:   Additional Comments:    Unice Bailey, LCSWA [phone]

## 2012-02-22 NOTE — ED Notes (Addendum)
Pt was released today from [facility] for renal failure to [facility] home. Called back out today because he is vomiting feces. Has not had a BM in 5 days. EMS reports AFIB on monitor. Hx A Flutter. CBG 268. Stomach distended and hard.

## 2012-02-22 NOTE — Progress Notes (Signed)
Preston Moore 1:46 PM  Subjective: The patient is a little better than yesterday and is swallowing a little better but still has Hiccups but denies abdominal painObjective: Signs stable afebrile abdomen is soft nontender BUN and creatinine continued to improve  Assessment: Multiple medical problem  Plan: Rediscussed case with patient and 2 daughters And hospital teamAnd agree with plan to repeat labs early next week and if BUN and creatinine better CT of abdomen and pelvis and then followup with me if no significant findings and he has continued to improve and another week or 2 and can stop Carafate in one week if not obviously helpful but continue pump inhibitors long-term  Preston Moore

## 2012-02-23 ENCOUNTER — Encounter (HOSPITAL_COMMUNITY): Payer: Self-pay | Admitting: General Surgery

## 2012-02-23 ENCOUNTER — Inpatient Hospital Stay (HOSPITAL_COMMUNITY): Payer: Medicare Other

## 2012-02-23 DIAGNOSIS — K56609 Unspecified intestinal obstruction, unspecified as to partial versus complete obstruction: Secondary | ICD-10-CM

## 2012-02-23 DIAGNOSIS — N179 Acute kidney failure, unspecified: Secondary | ICD-10-CM

## 2012-02-23 DIAGNOSIS — R112 Nausea with vomiting, unspecified: Secondary | ICD-10-CM

## 2012-02-23 HISTORY — DX: Unspecified intestinal obstruction, unspecified as to partial versus complete obstruction: K56.609

## 2012-02-23 LAB — COMPREHENSIVE METABOLIC PANEL
ALT: 16 U/L (ref 0–53)
Alkaline Phosphatase: 73 U/L (ref 39–117)
BUN: 40 mg/dL — ABNORMAL HIGH (ref 6–23)
CO2: 26 mEq/L (ref 19–32)
GFR calc Af Amer: 45 mL/min — ABNORMAL LOW (ref >90)
GFR calc non Af Amer: 38 mL/min — ABNORMAL LOW (ref >90)
Glucose, Bld: 285 mg/dL — ABNORMAL HIGH (ref 70–99)
Potassium: 3.9 mEq/L (ref 3.5–5.1)
Total Bilirubin: 0.5 mg/dL (ref 0.3–1.2)
Total Protein: 5.2 g/dL — ABNORMAL LOW (ref 6.0–8.3)

## 2012-02-23 LAB — URINE MICROSCOPIC-ADD ON

## 2012-02-23 LAB — URINALYSIS, ROUTINE W REFLEX MICROSCOPIC
Hgb urine dipstick: NEGATIVE
Nitrite: NEGATIVE
Protein, ur: 30 mg/dL — AB
Urobilinogen, UA: 1 mg/dL (ref 0.0–1.0)

## 2012-02-23 LAB — GLUCOSE, CAPILLARY
Glucose-Capillary: 287 mg/dL — ABNORMAL HIGH (ref 70–99)
Glucose-Capillary: 289 mg/dL — ABNORMAL HIGH (ref 70–99)
Glucose-Capillary: 194 mg/dL — ABNORMAL HIGH (ref 70–99)
Glucose-Capillary: 153 mg/dL — ABNORMAL HIGH (ref 70–99)

## 2012-02-23 LAB — DIFFERENTIAL
Basophils Absolute: 0 10*3/uL (ref 0.0–0.1)
Eosinophils Absolute: 0 10*3/uL (ref 0.0–0.7)
Eosinophils Relative: 0 % (ref 0–5)
Lymphs Abs: 1.4 10*3/uL (ref 0.7–4.0)
Monocytes Absolute: 0.7 10*3/uL (ref 0.1–1.0)
Neutrophils Relative %: 78 % — ABNORMAL HIGH (ref 43–77)

## 2012-02-23 LAB — CBC
HCT: 26.2 % — ABNORMAL LOW (ref 39.0–52.0)
Hemoglobin: 9.3 g/dL — ABNORMAL LOW (ref 13.0–17.0)
MCHC: 35.5 g/dL (ref 30.0–36.0)
RBC: 2.79 MIL/uL — ABNORMAL LOW (ref 4.22–5.81)

## 2012-02-23 LAB — MRSA PCR SCREENING: MRSA by PCR: NEGATIVE

## 2012-02-23 LAB — LIPASE, BLOOD: Lipase: 70 U/L — ABNORMAL HIGH (ref 11–59)

## 2012-02-23 MED ORDER — MORPHINE SULFATE 2 MG/ML IJ SOLN
2.0000 mg | INTRAMUSCULAR | Status: DC | PRN
  Administered 2012-02-23 – 2012-02-28 (×17): 2 mg via INTRAVENOUS
  Filled 2012-02-23 (×18): qty 1

## 2012-02-23 MED ORDER — METOPROLOL TARTRATE 1 MG/ML IV SOLN
5.0000 mg | Freq: Four times a day (QID) | INTRAVENOUS | Status: DC
  Administered 2012-02-23 – 2012-02-29 (×24): 5 mg via INTRAVENOUS
  Filled 2012-02-23 (×28): qty 5

## 2012-02-23 MED ORDER — FOLIC ACID 5 MG/ML IJ SOLN
1.0000 mg | Freq: Every day | INTRAMUSCULAR | Status: DC
  Administered 2012-02-23 – 2012-02-29 (×7): 1 mg via INTRAVENOUS
  Filled 2012-02-23 (×11): qty 0.2

## 2012-02-23 MED ORDER — CYANOCOBALAMIN 1000 MCG/ML IJ SOLN
1000.0000 ug | Freq: Every day | INTRAMUSCULAR | Status: AC
  Administered 2012-02-23 – 2012-02-27 (×5): 1000 ug via INTRAMUSCULAR
  Filled 2012-02-23 (×6): qty 1

## 2012-02-23 MED ORDER — PANTOPRAZOLE SODIUM 40 MG IV SOLR
40.0000 mg | Freq: Two times a day (BID) | INTRAVENOUS | Status: DC
  Administered 2012-02-23 – 2012-02-29 (×13): 40 mg via INTRAVENOUS
  Filled 2012-02-23 (×16): qty 40

## 2012-02-23 MED ORDER — KCL IN DEXTROSE-NACL 20-5-0.9 MEQ/L-%-% IV SOLN
INTRAVENOUS | Status: DC
  Administered 2012-02-23: 1000 mL via INTRAVENOUS
  Filled 2012-02-23 (×2): qty 1000

## 2012-02-23 MED ORDER — SODIUM CHLORIDE 0.9 % IV SOLN
INTRAVENOUS | Status: DC
  Administered 2012-02-23: 12:00:00 via INTRAVENOUS
  Administered 2012-02-23 – 2012-02-24 (×2): 1000 mL via INTRAVENOUS
  Administered 2012-02-24: 12:00:00 via INTRAVENOUS
  Administered 2012-02-24 – 2012-02-25 (×2): 1000 mL via INTRAVENOUS

## 2012-02-23 MED ORDER — INSULIN ASPART 100 UNIT/ML ~~LOC~~ SOLN
0.0000 [IU] | Freq: Three times a day (TID) | SUBCUTANEOUS | Status: DC
  Administered 2012-02-23: 5 [IU] via SUBCUTANEOUS
  Administered 2012-02-23 – 2012-02-24 (×5): 2 [IU] via SUBCUTANEOUS
  Administered 2012-02-25: 1 [IU] via SUBCUTANEOUS
  Filled 2012-02-23: qty 1

## 2012-02-23 MED ORDER — PANTOPRAZOLE SODIUM 40 MG IV SOLR: 40.0000 mg | Freq: Every day | INTRAVENOUS | Status: DC

## 2012-02-23 MED ORDER — SODIUM CHLORIDE 0.9 % IV SOLN: 1.0000 mg | Freq: Once | INTRAVENOUS | Status: DC

## 2012-02-23 MED ORDER — THIAMINE HCL 100 MG/ML IJ SOLN
100.0000 mg | Freq: Every day | INTRAMUSCULAR | Status: DC
  Administered 2012-02-23 – 2012-02-24 (×2): 100 mg via INTRAVENOUS
  Administered 2012-02-25: 10:00:00 via INTRAVENOUS
  Administered 2012-02-26 – 2012-02-29 (×4): 100 mg via INTRAVENOUS
  Filled 2012-02-23 (×8): qty 1

## 2012-02-23 MED ORDER — ONDANSETRON HCL 4 MG/2ML IJ SOLN
4.0000 mg | Freq: Four times a day (QID) | INTRAMUSCULAR | Status: DC | PRN
  Administered 2012-02-23 – 2012-02-27 (×4): 4 mg via INTRAVENOUS
  Filled 2012-02-23 (×4): qty 2

## 2012-02-23 NOTE — Clinical Social Work Psych Note (Signed)
CSW met with daughters, Eunice Blase and [name]. They expressed great concern that their father was d/c'd yesterday. They shared they do not want Pt to be cared for by [facility]. They report they were surprised about plans d/c yesterday and are upset about current situation. CSW provided supportive listening and support. They would like Pt to go to [facility] when medically ready. CSW spoke with facility and they would be glad to readmit him when able.  CSW will follow for dispo and support. CSW informed [facility] of family's concerns.  Vennie Homans, [place] 02/23/2012 12:17 PM #[phone]

## 2012-02-23 NOTE — ED Notes (Signed)
2nd cup of contrast in via NG tube!

## 2012-02-23 NOTE — ED Notes (Signed)
Patient transported to CT 

## 2012-02-23 NOTE — ED Notes (Signed)
Report update given to [facility] RN on 2 w. Daughters accompanying pt upstairs with ChadRN transporting.

## 2012-02-23 NOTE — Progress Notes (Signed)
[name] 11:54 AM  Subjective: Patient is doing well with his NG tube and actually has no complaints now  Objective: No acute distress lying comfortably in the bed abdomen is soft nontender decreased but occasional bowels sounds CT worrisome for bowel obstruction and pancreas looks good on CT white count normal BUN and creatinine increased a little liver tests okay lipase decreased Assessment: Questionable small bowel obstruction versus ileus  Plan: Medical care per surgical team will follow with you continue pump inhibitor  Ronnetta Currington E

## 2012-02-23 NOTE — ED Notes (Signed)
Patient returned from CT

## 2012-02-23 NOTE — H&P (Signed)
Preston Moore is an 78 y.o. male.   Chief Complaint: vomiting HPI: 78 yo wm who was hospitalized last week with vomiting and diarrhea. He was probably dehydrated and had renal insufficiency. He may have also had pancreatitis which contributed to this. He gradually improved and was discharged earlier today but family says he was still vomiting earlier before he left the hospital and had not eaten anything or had a bm in days. He returns tonight after an episode of bilious vomiting. He denies any fever.  Past Medical History  Diagnosis Date  . Atrial flutter   . Diabetes mellitus   . Neuropathy   . Hyperlipidemia   . Syncope and collapse   . Hypertension     Past Surgical History  Procedure Date  . Laminectomy 1965    Family History  Problem Relation Age of Onset  . Hypertension Neg Hx   . Coronary artery disease Neg Hx   . Diabetes Neg Hx    Social History:  reports that he has been smoking Cigarettes.  He has a 5 pack-year smoking history. He has never used smokeless tobacco. He reports that he drinks about 21.6 ounces of alcohol per week. He reports that he does not use illicit drugs.  Allergies: No Known Allergies   (Not in a hospital admission)  Results for orders placed during the hospital encounter of 02/22/12 (from the past 48 hour(Moore))  OCCULT BLOOD, POC DEVICE     Status: Normal   Collection Time   02/22/12 10:31 PM      Component Value Range Comment   Fecal Occult Bld POSITIVE     CBC     Status: Abnormal   Collection Time   02/22/12 11:51 PM      Component Value Range Comment   WBC 9.5  4.0 - 10.5 (K/uL)    RBC 2.79 (*) 4.22 - 5.81 (MIL/uL)    Hemoglobin 9.3 (*) 13.0 - 17.0 (g/dL)    HCT 11.9 (*) 14.7 - 52.0 (%)    MCV 93.9  78.0 - 100.0 (fL)    MCH 33.3  26.0 - 34.0 (pg)    MCHC 35.5  30.0 - 36.0 (g/dL)    RDW 82.9  56.2 - 13.0 (%)    Platelets 309  150 - 400 (K/uL)   COMPREHENSIVE METABOLIC PANEL     Status: Abnormal   Collection Time   02/22/12 11:51 PM    Component Value Range Comment   Sodium 131 (*) 135 - 145 (mEq/L)    Potassium 3.9  3.5 - 5.1 (mEq/L)    Chloride 96  96 - 112 (mEq/L)    CO2 26  19 - 32 (mEq/L)    Glucose, Bld 285 (*) 70 - 99 (mg/dL)    BUN 40 (*) 6 - 23 (mg/dL)    Creatinine, Ser 8.65 (*) 0.50 - 1.35 (mg/dL)    Calcium 6.9 (*) 8.4 - 10.5 (mg/dL)    Total Protein 5.2 (*) 6.0 - 8.3 (g/dL)    Albumin 2.0 (*) 3.5 - 5.2 (g/dL)    AST 21  0 - 37 (U/L)    ALT 16  0 - 53 (U/L)    Alkaline Phosphatase 73  39 - 117 (U/L)    Total Bilirubin 0.5  0.3 - 1.2 (mg/dL)    GFR calc non Af Amer 38 (*) >90 (mL/min)    GFR calc Af Amer 45 (*) >90 (mL/min)   DIFFERENTIAL     Status: Abnormal  Collection Time   02/22/12 11:51 PM      Component Value Range Comment   Neutrophils Relative 78 (*) 43 - 77 (%)    Lymphocytes Relative 15  12 - 46 (%)    Monocytes Relative 7  3 - 12 (%)    Eosinophils Relative 0  0 - 5 (%)    Basophils Relative 0  0 - 1 (%)    Neutro Abs 7.4  1.7 - 7.7 (K/uL)    Lymphs Abs 1.4  0.7 - 4.0 (K/uL)    Monocytes Absolute 0.7  0.1 - 1.0 (K/uL)    Eosinophils Absolute 0.0  0.0 - 0.7 (K/uL)    Basophils Absolute 0.0  0.0 - 0.1 (K/uL)    WBC Morphology MILD LEFT SHIFT (1-5% METAS, OCC MYELO, OCC BANDS)     LIPASE, BLOOD     Status: Abnormal   Collection Time   02/22/12 11:51 PM      Component Value Range Comment   Lipase 70 (*) 11 - 59 (U/L)    Dg Abd Acute W/chest  02/22/2012  *RADIOLOGY REPORT*  Clinical Data: Bilious emesis.  ACUTE ABDOMEN SERIES (ABDOMEN 2 VIEW & CHEST 1 VIEW)  Comparison: 09/17/2010  Findings: Lung volumes are low.  There is left base atelectasis or infiltrate.  Subsegmental atelectasis is seen in the right midlung. The cardiopericardial silhouette is enlarged.  Right side up decubitus film shows no evidence for intraperitoneal free air.  The supine abdominal film shows diffuse gaseous small bowel dilatation measuring up to about 5 cm in diameter.  There is a paucity of colonic gas in the  abdomen.  IMPRESSION: Left base atelectasis or infiltrate.  Diffuse gaseous small bowel dilatation, consistent with small bowel obstruction.  CT imaging of the abdomen and pelvis may prove helpful to further evaluate.  Original Report Authenticated By: Preston Moore, M.D.    Review of Systems  HENT: Negative.   Eyes: Negative.   Respiratory: Negative.   Cardiovascular: Negative.   Gastrointestinal: Positive for vomiting.  Genitourinary: Negative.   Musculoskeletal: Negative.   Skin: Negative.   Neurological: Positive for weakness.  Endo/Heme/Allergies: Negative.   Psychiatric/Behavioral: Negative.     Blood pressure 104/53, pulse 71, resp. rate 29, SpO2 95.00%. Physical Exam  Constitutional: He is oriented to person, place, and time. He appears well-developed and well-nourished.  HENT:  Head: Normocephalic and atraumatic.  Eyes: Conjunctivae and EOM are normal. Pupils are equal, round, and reactive to light.  Neck: Normal range of motion. Neck supple.  Cardiovascular:       Irregular rate and rhythm  Respiratory: Effort normal and breath sounds normal.  GI: Soft.       Mild distension and mild tenderness on left abd. No guarding or peritonitis. No evidence of hernia. No palpable mass  Musculoskeletal: Normal range of motion.  Neurological: He is alert and oriented to person, place, and time.  Skin: Skin is warm and dry.  Psychiatric: He has a normal mood and affect. His behavior is normal.     Assessment/Plan The pt has plain xrays consistent with bowel obstruction although he has no history of abd surgery. His renal function is improving. I will plan to admit him and continue his resuscitation. i will plan to get a CT of abd and pelvis with just oral contrast for now to see if we can find a source for his symptoms. Will continue bowel rest for now  Preston Moore,Preston Moore 02/23/2012, 1:12 AM

## 2012-02-23 NOTE — Progress Notes (Signed)
Subjective: Not much better, want something to drink.  Objective: Vital signs in last 24 hours: Temp:  [97.6 F (36.4 C)-98.7 F (37.1 C)] 98.7 F (37.1 C) (05/10 0940) Pulse Rate:  [71-110] 88  (05/10 0940) Resp:  [14-30] 14  (05/10 0940) BP: (92-122)/(53-73) 98/54 mmHg (05/10 0940) SpO2:  [92 %-100 %] 98 % (05/10 0940) Weight:  [100.8 kg (222 lb 3.6 oz)] 100.8 kg (222 lb 3.6 oz) (05/10 0940) Last BM Date: 02/21/12  Afebrile, BP down to 98 this AM, 800 per NG yesterday.telem looks irregular sinus, EKG yest shows ST.Stool guaiac +.  Intake/Output from previous day:   Intake/Output this shift: Total I/O In: 748.3 [I.V.:748.3] Out: 1150 [Urine:350; Emesis/NG output:800]  General appearance: alert, cooperative and falls asleep during exam Resp: clear to auscultation bilaterally GI: soft, non-tender; bowel sounds normal; no masses,  no organomegaly  Lab Results:   [facility] 02/22/12 2351 02/22/12 0435  WBC 9.5 10.6*  HGB 9.3* 9.1*  HCT 26.2* 25.7*  PLT 309 243    BMET  Basename 02/22/12 2351 02/22/12 0435  NA 131* 131*  K 3.9 3.9  CL 96 97  CO2 26 24  GLUCOSE 285* 193*  BUN 40* 34*  CREATININE 1.67* 1.45*  CALCIUM 6.9* 7.3*   PT/INR No results found for this basename: LABPROT:2,INR:2 in the last 72 hours   Lab 02/22/12 2351 02/21/12 0459 02/19/12 1324 02/17/12 2342  AST 21 25 27  38*  ALT 16 14 12 20   ALKPHOS 73 72 44 53  BILITOT 0.5 0.4 0.2* 0.2*  PROT 5.2* 5.0* 4.4* 5.7*  ALBUMIN 2.0* 2.2* 2.2* 2.9*     Lipase     Component Value Date/Time   LIPASE 70* 02/22/2012 2351     Studies/Results: Ct Abdomen Pelvis Wo Contrast  02/23/2012  *RADIOLOGY REPORT*  Clinical Data: Distended abdomen, nausea, vomiting and constipation.  CT ABDOMEN AND PELVIS WITHOUT CONTRAST  Technique:  Multidetector CT imaging of the abdomen and pelvis was performed following the standard protocol without intravenous contrast.  Comparison: CT of the abdomen and pelvis performed  05/02/2011, and renal ultrasound performed 02/18/2012  Findings: A small right pleural effusion is seen.  Left basilar airspace opacification raises concern for pneumonia.  Diffuse coronary artery calcifications are seen.  The liver and spleen are unremarkable in appearance.  The gallbladder is within normal limits.  The pancreas and adrenal glands are unremarkable.  The kidneys are unremarkable in appearance.  There is no evidence of hydronephrosis.  No renal or ureteral stones are seen.  No perinephric stranding is appreciated.  A small amount of free fluid is noted tracking along the right paracolic gutter.  There is mild diffuse distension of small bowel loops, to 4.2 cm in maximal diameter.  There is gradual dilution of contrast within the small bowel, with gradual decompression of the small bowel to the level of the terminal ileum.  Mild surrounding soft tissue stranding is noted, and fluid is seen tracking along small bowel loops. This could reflect partial small bowel obstruction, or small bowel dysmotility; no definite focal transition point is characterized.  The stomach is within normal limits.  An enteric tube is noted ending at the antrum of the stomach.  No acute vascular abnormalities are seen.  Scattered calcification is noted along the abdominal aorta and its branches.  This is particularly prominent along the proximal superior mesenteric artery and right renal artery.  The appendix is normal in caliber, without evidence for appendicitis.  Residual contrast is  noted within the colon; the colon is largely decompressed.  Asymmetric soft tissue edema is noted along the right lateral abdominal wall.  The bladder is decompressed, with a Foley catheter in place; scattered air within the bladder reflects Foley catheter placement. The prostate is mildly enlarged, measuring 4.9 cm in transverse dimension.  A small right inguinal hernia is noted, containing only fat.  No inguinal lymphadenopathy is seen.  No  acute osseous abnormalities are identified.  Vacuum phenomenon and disc space narrowing are noted at L4-L5.  IMPRESSION:  1.  Diffuse distension of small bowel loops, to 4.2 cm in maximal diameter.  There is gradual decompression of the small bowel to the level of the terminal ileum, with mild surrounding soft tissue stranding and fluid tracking about small bowel loops.  This could reflect partial small bowel obstruction, or small bowel dysmotility; no definite focal transition point is seen to suggest high-grade obstruction. 2.  Decompression of the colon suggests bowel obstruction. 3.  Left basilar airspace opacification raises concern for pneumonia. 4.  Small amount of free fluid noted at the right side of the abdomen. 5.  Asymmetric soft tissue edema noted along the right lateral abdominal wall. 6.  Scattered calcification along the abdominal aorta and its branches, particularly prominent along the proximal superior mesenteric artery and right renal artery. 7.  Small right pleural effusion seen. 8.  Diffuse coronary artery calcification noted. 9.  Small right inguinal hernia, containing only fat. 10.  Mildly enlarged prostate.  Original Report Authenticated By: Tonia Ghent, M.D.   Dg Abd Acute W/chest  02/22/2012  *RADIOLOGY REPORT*  Clinical Data: Bilious emesis.  ACUTE ABDOMEN SERIES (ABDOMEN 2 VIEW & CHEST 1 VIEW)  Comparison: 09/17/2010  Findings: Lung volumes are low.  There is left base atelectasis or infiltrate.  Subsegmental atelectasis is seen in the right midlung. The cardiopericardial silhouette is enlarged.  Right side up decubitus film shows no evidence for intraperitoneal free air.  The supine abdominal film shows diffuse gaseous small bowel dilatation measuring up to about 5 cm in diameter.  There is a paucity of colonic gas in the abdomen.  IMPRESSION: Left base atelectasis or infiltrate.  Diffuse gaseous small bowel dilatation, consistent with small bowel obstruction.  CT imaging of the  abdomen and pelvis may prove helpful to further evaluate.  Original Report Authenticated By: ERIC A. MANSELL, M.D.    Medications:    . insulin aspart  0-9 Units Subcutaneous TID WC  . metoprolol  5 mg Intravenous Q6H  . ondansetron  4 mg Intravenous Once  . pantoprazole (PROTONIX) IV  40 mg Intravenous QHS    Assessment/Plan Hospitalized 02/17/12 - 02/22/12 with: Acute renal failure creatinine 3.85 Hyperkalemia  Nausea and vomiting  Diarrhea  Iron deficiency anemia  Lactic acidosis  Pancreatitis / possibly alcholic Physical deconditioning  B12 deficiency  Esophageal reflux disease  AODM Now back 02/23/12 with: SBO/no hx of prior abd surgery. Hx GI bleed, followed by DR. [name] Possible ETOH use   Plan:  NG decompression, glucose is up, I will change to NS, ask Medicine to see and help with medical issues.     LOS: 1 day    Jarone Ostergaard 02/23/2012

## 2012-02-23 NOTE — Progress Notes (Signed)
CARE MANAGEMENT NOTE 02/23/2012  Patient:  Preston Moore, Preston Moore   Account Number:  [id number]  Date Initiated:  02/23/2012  Documentation initiated by:  Faye Strohman  Subjective/Objective Assessment:   pt with recent admission and was discharge to snf on [id number], started having nausea and vomiting, abd pain abd film-sbo     Action/Plan:   [facility]   Anticipated DC Date:  02/26/2012   Anticipated DC Plan:  SKILLED NURSING FACILITY  In-house referral  Clinical Social Worker      DC Planning Services  NA      [facility] Choice  NA   Choice offered to / List presented to:  NA   DME arranged  NA      DME agency  NA     HH arranged  NA      HH agency  NA   Status of service:  In process, will continue to follow Medicare Important Message given?  NA - LOS <3 / Initial given by admissions (If response is "NO", the following Medicare IM given date fields will be blank) Date Medicare IM given:   Date Additional Medicare IM given:    Discharge Disposition:    Per UR Regulation:  Reviewed for med. necessity/level of care/duration of stay  If discussed at Long Length of Stay Meetings, dates discussed:    Comments:  [id number]/Antwoine Zorn Earlene Plater, RN, BSN, CCM No discharge needs present at time of this review at the sdu/icu level. Case Management [phone]

## 2012-02-23 NOTE — Progress Notes (Signed)
Inpatient Diabetes Program  Results for Preston, Moore (MRN [id number]) as of 02/23/2012 14:41  Ref. Range 02/22/2012 23:51  Sodium Latest Range: 135-145 mEq/L 131 (L)  Potassium Latest Range: 3.5-5.1 mEq/L 3.9  Chloride Latest Range: 96-112 mEq/L 96  CO2 Latest Range: 19-32 mEq/L 26  BUN Latest Range: 6-23 mg/dL 40 (H)  Creat Latest Range: 0.50-1.35 mg/dL 7.82 (H)  Calcium Latest Range: 8.4-10.5 mg/dL 6.9 (L)  GFR calc non Af Amer Latest Range: >90 mL/min 38 (L)  GFR calc Af Amer Latest Range: >90 mL/min 45 (L)  Glucose Latest Range: 70-99 mg/dL 956 (H)  Alkaline Phosphatase Latest Range: 39-117 U/L 73  Albumin Latest Range: 3.5-5.2 g/dL 2.0 (L)  Lipase Latest Range: 11-59 U/L 70 (H)  AST Latest Range: 0-37 U/L 21  ALT Latest Range: 0-53 U/L 16  Total Protein Latest Range: 6.0-8.3 g/dL 5.2 (L)  Total Bilirubin Latest Range: 0.3-1.2 mg/dL 0.5  Results for Preston, Moore (MRN [id number]) as of 02/23/2012 14:41  Ref. Range 02/22/2012 04:35 02/22/2012 23:51  Glucose Latest Range: 70-99 mg/dL 469 (H) 629 (H)   Recommendations:  Add Lantus 8 units QHS. Will follow.

## 2012-02-23 NOTE — Progress Notes (Signed)
I have seen and examined the patient and agree with the assessment and plans.  Juliano Mceachin A. Nazaire Cordial  MD, FACS  

## 2012-02-23 NOTE — Consult Note (Signed)
Requesting physician: Dr. Abigail Miyamoto, general surgery  Primary Care Physician: Gabriel Cirri, DO, DO  Reason for consultation: Management of medical comorbidities.   History of Present Illness: Patient is a 78 year old white gentleman known to our service for a recent hospitalization between May 4 and May 9 at which time he was diagnosed with acute pancreatitis, reflux disease, acute renal failure and hyperkalemia. He was discharged yesterday to skilled nursing facility. I have spoken with patient's daughter and power of attorney Hermine Messick to get history. It appears soon after his arrival to SNF he started having dark colored emesis and the nursing facility decided to transport him back to the hospital for evaluation. In the emergency department a CT scan was done that showed evidence for a small bowel obstruction versus ileus, the surgical team was asked to admit him. An NG tube was placed. We are asked to see him to help comanage his medical comorbidities.  Allergies:  No Known Allergies    Past Medical History  Diagnosis Date  . Atrial flutter   . Diabetes mellitus   . Neuropathy   . Hyperlipidemia   . Syncope and collapse   . Hypertension     Past Surgical History  Procedure Date  . Laminectomy 1965  . Back surgery     Scheduled Meds:    . insulin aspart  0-9 Units Subcutaneous TID WC  . metoprolol  5 mg Intravenous Q6H  . ondansetron  4 mg Intravenous Once  . pantoprazole (PROTONIX) IV  40 mg Intravenous Q12H  . DISCONTD: pantoprazole (PROTONIX) IV  40 mg Intravenous QHS   Continuous Infusions:    . sodium chloride 125 mL/hr at 02/23/12 1151  . DISCONTD: dextrose 5 % and 0.9 % NaCl with KCl 20 mEq/L Stopped (02/23/12 1151)   PRN Meds:.morphine injection, ondansetron  Social History:  reports that he has been smoking Cigarettes.  He has a 5 pack-year smoking history. He has never used smokeless tobacco. He reports that he drinks about 21.6 ounces of  alcohol per week. He reports that he does not use illicit drugs.  Family History  Problem Relation Age of Onset  . Hypertension Neg Hx   . Coronary artery disease Neg Hx   . Diabetes Neg Hx     Review of Systems:  Negative except as mentioned in history of present illness.  Physical Exam: Blood pressure 102/57, pulse 88, temperature 97.4 F (36.3 C), temperature source Oral, resp. rate 15, height 5\' 9"  (1.753 m), weight 100.8 kg (222 lb 3.6 oz), SpO2 97.00%. General: Awake, somnolent, NG tube placed. HEENT: Normocephalic, atraumatic, pupils equal round reactive, intact extraocular movements. Cardiovascular: Regular rate and rhythm, no murmurs, rubs or gallops.  respiratory: Clear to auscultation bilaterally. Abdomen: Soft, nontender, normal bowel sounds, no masses, no organomegaly, Extremities: 2+ edema bilaterally.   Labs on Admission:  Results for orders placed during the hospital encounter of 02/22/12 (from the past 48 hour(s))  OCCULT BLOOD, POC DEVICE     Status: Normal   Collection Time   02/22/12 10:31 PM      Component Value Range Comment   Fecal Occult Bld POSITIVE     CBC     Status: Abnormal   Collection Time   02/22/12 11:51 PM      Component Value Range Comment   WBC 9.5  4.0 - 10.5 (K/uL)    RBC 2.79 (*) 4.22 - 5.81 (MIL/uL)    Hemoglobin 9.3 (*) 13.0 - 17.0 (g/dL)  HCT 26.2 (*) 39.0 - 52.0 (%)    MCV 93.9  78.0 - 100.0 (fL)    MCH 33.3  26.0 - 34.0 (pg)    MCHC 35.5  30.0 - 36.0 (g/dL)    RDW 14.7  82.9 - 56.2 (%)    Platelets 309  150 - 400 (K/uL)   COMPREHENSIVE METABOLIC PANEL     Status: Abnormal   Collection Time   02/22/12 11:51 PM      Component Value Range Comment   Sodium 131 (*) 135 - 145 (mEq/L)    Potassium 3.9  3.5 - 5.1 (mEq/L)    Chloride 96  96 - 112 (mEq/L)    CO2 26  19 - 32 (mEq/L)    Glucose, Bld 285 (*) 70 - 99 (mg/dL)    BUN 40 (*) 6 - 23 (mg/dL)    Creatinine, Ser 1.30 (*) 0.50 - 1.35 (mg/dL)    Calcium 6.9 (*) 8.4 - 10.5 (mg/dL)     Total Protein 5.2 (*) 6.0 - 8.3 (g/dL)    Albumin 2.0 (*) 3.5 - 5.2 (g/dL)    AST 21  0 - 37 (U/L)    ALT 16  0 - 53 (U/L)    Alkaline Phosphatase 73  39 - 117 (U/L)    Total Bilirubin 0.5  0.3 - 1.2 (mg/dL)    GFR calc non Af Amer 38 (*) >90 (mL/min)    GFR calc Af Amer 45 (*) >90 (mL/min)   DIFFERENTIAL     Status: Abnormal   Collection Time   02/22/12 11:51 PM      Component Value Range Comment   Neutrophils Relative 78 (*) 43 - 77 (%)    Lymphocytes Relative 15  12 - 46 (%)    Monocytes Relative 7  3 - 12 (%)    Eosinophils Relative 0  0 - 5 (%)    Basophils Relative 0  0 - 1 (%)    Neutro Abs 7.4  1.7 - 7.7 (K/uL)    Lymphs Abs 1.4  0.7 - 4.0 (K/uL)    Monocytes Absolute 0.7  0.1 - 1.0 (K/uL)    Eosinophils Absolute 0.0  0.0 - 0.7 (K/uL)    Basophils Absolute 0.0  0.0 - 0.1 (K/uL)    WBC Morphology MILD LEFT SHIFT (1-5% METAS, OCC MYELO, OCC BANDS)     LIPASE, BLOOD     Status: Abnormal   Collection Time   02/22/12 11:51 PM      Component Value Range Comment   Lipase 70 (*) 11 - 59 (U/L)   URINALYSIS, ROUTINE W REFLEX MICROSCOPIC     Status: Abnormal   Collection Time   02/23/12 12:47 AM      Component Value Range Comment   Color, Urine AMBER (*) YELLOW  BIOCHEMICALS MAY BE AFFECTED BY COLOR   APPearance TURBID (*) CLEAR     Specific Gravity, Urine 1.021  1.005 - 1.030     pH 5.5  5.0 - 8.0     Glucose, UA 100 (*) NEGATIVE (mg/dL)    Hgb urine dipstick NEGATIVE  NEGATIVE     Bilirubin Urine SMALL (*) NEGATIVE     Ketones, ur TRACE (*) NEGATIVE (mg/dL)    Protein, ur 30 (*) NEGATIVE (mg/dL)    Urobilinogen, UA 1.0  0.0 - 1.0 (mg/dL)    Nitrite NEGATIVE  NEGATIVE     Leukocytes, UA NEGATIVE  NEGATIVE    URINE MICROSCOPIC-ADD ON  Status: Abnormal   Collection Time   02/23/12 12:47 AM      Component Value Range Comment   Squamous Epithelial / LPF MANY (*) RARE     WBC, UA 3-6  <3 (WBC/hpf)    RBC / HPF 3-6  <3 (RBC/hpf)    Bacteria, UA MANY (*) RARE      Urine-Other MUCOUS PRESENT     GLUCOSE, CAPILLARY     Status: Abnormal   Collection Time   02/23/12  6:03 AM      Component Value Range Comment   Glucose-Capillary 287 (*) 70 - 99 (mg/dL)   GLUCOSE, CAPILLARY     Status: Abnormal   Collection Time   02/23/12  8:31 AM      Component Value Range Comment   Glucose-Capillary 289 (*) 70 - 99 (mg/dL)   MRSA PCR SCREENING     Status: Normal   Collection Time   02/23/12  9:53 AM      Component Value Range Comment   MRSA by PCR NEGATIVE  NEGATIVE    GLUCOSE, CAPILLARY     Status: Abnormal   Collection Time   02/23/12 12:40 PM      Component Value Range Comment   Glucose-Capillary 194 (*) 70 - 99 (mg/dL)   GLUCOSE, CAPILLARY     Status: Abnormal   Collection Time   02/23/12  5:21 PM      Component Value Range Comment   Glucose-Capillary 175 (*) 70 - 99 (mg/dL)    Comment 1 Documented in Chart      Comment 2 Notify RN       Radiological Exams on Admission: Ct Abdomen Pelvis Wo Contrast  02/23/2012  *RADIOLOGY REPORT*  Clinical Data: Distended abdomen, nausea, vomiting and constipation.  CT ABDOMEN AND PELVIS WITHOUT CONTRAST  Technique:  Multidetector CT imaging of the abdomen and pelvis was performed following the standard protocol without intravenous contrast.  Comparison: CT of the abdomen and pelvis performed 05/02/2011, and renal ultrasound performed 02/18/2012  Findings: A small right pleural effusion is seen.  Left basilar airspace opacification raises concern for pneumonia.  Diffuse coronary artery calcifications are seen.  The liver and spleen are unremarkable in appearance.  The gallbladder is within normal limits.  The pancreas and adrenal glands are unremarkable.  The kidneys are unremarkable in appearance.  There is no evidence of hydronephrosis.  No renal or ureteral stones are seen.  No perinephric stranding is appreciated.  A small amount of free fluid is noted tracking along the right paracolic gutter.  There is mild diffuse  distension of small bowel loops, to 4.2 cm in maximal diameter.  There is gradual dilution of contrast within the small bowel, with gradual decompression of the small bowel to the level of the terminal ileum.  Mild surrounding soft tissue stranding is noted, and fluid is seen tracking along small bowel loops. This could reflect partial small bowel obstruction, or small bowel dysmotility; no definite focal transition point is characterized.  The stomach is within normal limits.  An enteric tube is noted ending at the antrum of the stomach.  No acute vascular abnormalities are seen.  Scattered calcification is noted along the abdominal aorta and its branches.  This is particularly prominent along the proximal superior mesenteric artery and right renal artery.  The appendix is normal in caliber, without evidence for appendicitis.  Residual contrast is noted within the colon; the colon is largely decompressed.  Asymmetric soft tissue edema is noted along  the right lateral abdominal wall.  The bladder is decompressed, with a Foley catheter in place; scattered air within the bladder reflects Foley catheter placement. The prostate is mildly enlarged, measuring 4.9 cm in transverse dimension.  A small right inguinal hernia is noted, containing only fat.  No inguinal lymphadenopathy is seen.  No acute osseous abnormalities are identified.  Vacuum phenomenon and disc space narrowing are noted at L4-L5.  IMPRESSION:  1.  Diffuse distension of small bowel loops, to 4.2 cm in maximal diameter.  There is gradual decompression of the small bowel to the level of the terminal ileum, with mild surrounding soft tissue stranding and fluid tracking about small bowel loops.  This could reflect partial small bowel obstruction, or small bowel dysmotility; no definite focal transition point is seen to suggest high-grade obstruction. 2.  Decompression of the colon suggests bowel obstruction. 3.  Left basilar airspace opacification raises  concern for pneumonia. 4.  Small amount of free fluid noted at the right side of the abdomen. 5.  Asymmetric soft tissue edema noted along the right lateral abdominal wall. 6.  Scattered calcification along the abdominal aorta and its branches, particularly prominent along the proximal superior mesenteric artery and right renal artery. 7.  Small right pleural effusion seen. 8.  Diffuse coronary artery calcification noted. 9.  Small right inguinal hernia, containing only fat. 10.  Mildly enlarged prostate.  Original Report Authenticated By: Tonia Ghent, M.D.   Dg Abd Acute W/chest  02/22/2012  *RADIOLOGY REPORT*  Clinical Data: Bilious emesis.  ACUTE ABDOMEN SERIES (ABDOMEN 2 VIEW & CHEST 1 VIEW)  Comparison: 09/17/2010  Findings: Lung volumes are low.  There is left base atelectasis or infiltrate.  Subsegmental atelectasis is seen in the right midlung. The cardiopericardial silhouette is enlarged.  Right side up decubitus film shows no evidence for intraperitoneal free air.  The supine abdominal film shows diffuse gaseous small bowel dilatation measuring up to about 5 cm in diameter.  There is a paucity of colonic gas in the abdomen.  IMPRESSION: Left base atelectasis or infiltrate.  Diffuse gaseous small bowel dilatation, consistent with small bowel obstruction.  CT imaging of the abdomen and pelvis may prove helpful to further evaluate.  Original Report Authenticated By: ERIC A. MANSELL, M.D.    Assessment/Plan Principal Problem:  *SBO (small bowel obstruction) Active Problems:  Acute renal failure  Iron deficiency anemia  B12 deficiency  Esophageal reflux disease   #1 small bowel obstruction: Agree with NG tube placement to suction. Management as per surgery.  #2 acute renal failure: On May 4 his creatinine was 3.8 it had decreased to 1.4 upon discharge yesterday, it is up to 1.6 today. He had normal creatinine levels prior to hospitalization on May 4. This should continue to improve with IV  fluids. Follow daily.  #3 iron deficiency and vitamin B 12 deficiency anemia: We'll continue supplementation.  #4 hyperglycemia: No history of diabetes. Suspect secondary to glucose that he was receiving IV fluids. Has already been switched over to normal saline by the surgical team.  #5 esophageal reflux disease: Continue proton pump inhibitor and Carafate.  #6 history of alcohol use: Continue thiamine and folate.  Thank you for this consultation. We will follow along with you.  Time Spent on Consultation: 40 minutes  HERNANDEZ ACOSTA,Devlynn Knoff [facility]  [phone] 02/23/2012, 7:05 PM

## 2012-02-24 ENCOUNTER — Other Ambulatory Visit (HOSPITAL_COMMUNITY): Payer: Medicare Other

## 2012-02-24 ENCOUNTER — Inpatient Hospital Stay (HOSPITAL_COMMUNITY): Payer: Medicare Other

## 2012-02-24 DIAGNOSIS — R112 Nausea with vomiting, unspecified: Secondary | ICD-10-CM

## 2012-02-24 DIAGNOSIS — E46 Unspecified protein-calorie malnutrition: Secondary | ICD-10-CM

## 2012-02-24 DIAGNOSIS — K56609 Unspecified intestinal obstruction, unspecified as to partial versus complete obstruction: Secondary | ICD-10-CM

## 2012-02-24 DIAGNOSIS — N179 Acute kidney failure, unspecified: Secondary | ICD-10-CM

## 2012-02-24 LAB — COMPREHENSIVE METABOLIC PANEL
ALT: 18 U/L (ref 0–53)
Alkaline Phosphatase: 68 U/L (ref 39–117)
CO2: 26 mEq/L (ref 19–32)
Chloride: 103 mEq/L (ref 96–112)
GFR calc Af Amer: 53 mL/min — ABNORMAL LOW (ref >90)
GFR calc non Af Amer: 46 mL/min — ABNORMAL LOW (ref >90)
Glucose, Bld: 161 mg/dL — ABNORMAL HIGH (ref 70–99)
Potassium: 3.7 mEq/L (ref 3.5–5.1)
Sodium: 140 mEq/L (ref 135–145)

## 2012-02-24 LAB — GLUCOSE, CAPILLARY
Glucose-Capillary: 173 mg/dL — ABNORMAL HIGH (ref 70–99)
Glucose-Capillary: 168 mg/dL — ABNORMAL HIGH (ref 70–99)

## 2012-02-24 LAB — CULTURE, BLOOD (ROUTINE X 2)
Culture  Setup Time: 201305051053
Culture: NO GROWTH

## 2012-02-24 LAB — CBC
Hemoglobin: 8.2 g/dL — ABNORMAL LOW (ref 13.0–17.0)
RBC: 2.47 MIL/uL — ABNORMAL LOW (ref 4.22–5.81)
WBC: 5.9 10*3/uL (ref 4.0–10.5)

## 2012-02-24 MED ORDER — LORAZEPAM 2 MG/ML IJ SOLN
0.5000 mg | Freq: Once | INTRAMUSCULAR | Status: AC
  Administered 2012-02-24: 0.5 mg via INTRAVENOUS

## 2012-02-24 MED ORDER — LORAZEPAM 2 MG/ML IJ SOLN
INTRAMUSCULAR | Status: AC
  Filled 2012-02-24: qty 1

## 2012-02-24 NOTE — Plan of Care (Signed)
Problem: Phase I Progression Outcomes Goal: Initial discharge plan identified Outcome: Completed/Met Date Met:  02/24/12 To [facility]

## 2012-02-24 NOTE — Progress Notes (Signed)
Subjective: No abdominal pain although still somewhat distended. Discussed case with daughter and power of attorney Preston Moore via phone.  Objective: Vital signs in last 24 hours: Temp:  [97.3 F (36.3 C)-98.2 F (36.8 C)] 98.1 F (36.7 C) (05/11 0800) Pulse Rate:  [73-132] 90  (05/11 1200) Resp:  [13-19] 19  (05/11 1200) BP: (96-120)/(46-60) 106/54 mmHg (05/11 1200) SpO2:  [92 %-97 %] 93 % (05/11 1200) Weight:  [102 kg (224 lb 13.9 oz)] 102 kg (224 lb 13.9 oz) (05/11 0130) Weight change:  Last BM Date: 02/21/12  Intake/Output from previous day: 05/10 0701 - 05/11 0700 In: 2862.1 [P.O.:250; I.V.:2602.1; IV Piggyback:10] Out: 3025 [Urine:1225; Emesis/NG output:1800] Total I/O In: -  Out: 275 [Urine:275]   Physical Exam: General: Alert, awake, oriented x3, in no acute distress. HEENT: No bruits, no goiter. Heart: Regular rate and rhythm, without murmurs, rubs, gallops. Lungs: Clear to auscultation bilaterally. Abdomen: Soft, nontender, nondistended, positive bowel sounds. Extremities: No clubbing cyanosis or edema with positive pedal pulses. Neuro: Grossly intact, nonfocal.    Lab Results: Basic Metabolic Panel:  Basename 02/24/12 0335 02/22/12 2351  NA 140 131*  K 3.7 3.9  CL 103 96  CO2 26 26  GLUCOSE 161* 285*  BUN 35* 40*  CREATININE 1.44* 1.67*  CALCIUM 6.6* 6.9*  MG 1.4* --  PHOS -- --   Liver Function Tests:  Basename 02/24/12 0335 02/22/12 2351  AST 28 21  ALT 18 16  ALKPHOS 68 73  BILITOT 0.4 0.5  PROT 4.8* 5.2*  ALBUMIN 1.8* 2.0*    Basename 02/24/12 0335 02/22/12 2351  LIPASE 32 70*  AMYLASE -- --   CBC:  Basename 02/24/12 0335 02/22/12 2351  WBC 5.9 9.5  NEUTROABS -- 7.4  HGB 8.2* 9.3*  HCT 23.8* 26.2*  MCV 96.4 93.9  PLT 297 309   CBG:  Basename 02/24/12 1146 02/24/12 0803 02/23/12 2136 02/23/12 1721 02/23/12 1240 02/23/12 0831  GLUCAP 173* 179* 153* 175* 194* 289*   Urinalysis:  Basename 02/23/12 0047  COLORURINE AMBER*    LABSPEC 1.021  PHURINE 5.5  GLUCOSEU 100*  HGBUR NEGATIVE  BILIRUBINUR SMALL*  KETONESUR TRACE*  PROTEINUR 30*  UROBILINOGEN 1.0  NITRITE NEGATIVE  LEUKOCYTESUR NEGATIVE    Recent Results (from the past 240 hour(s))  CULTURE, BLOOD (ROUTINE X 2)     Status: Normal   Collection Time   02/18/12  3:43 AM      Component Value Range Status Comment   Specimen Description BLOOD RIGHT ANTECUBITAL   Final    Special Requests BOTTLES DRAWN AEROBIC AND ANAEROBIC [facility]   Final    Culture  Setup Time [id number]   Final    Culture     Final    Value: STAPHYLOCOCCUS SPECIES (COAGULASE NEGATIVE)     Note: THE SIGNIFICANCE OF ISOLATING THIS ORGANISM FROM A SINGLE SET OF BLOOD CULTURES WHEN MULTIPLE SETS ARE DRAWN IS UNCERTAIN. PLEASE NOTIFY THE MICROBIOLOGY DEPARTMENT WITHIN ONE WEEK IF SPECIATION AND SENSITIVITIES ARE REQUIRED.     Note: Gram Stain Report Called to,Read Back By and Verified With: JACQUELINE BIGPEN 02/19/12 1140 BY SMITHERSJ   Report Status 02/20/2012 FINAL   Final   CULTURE, BLOOD (ROUTINE X 2)     Status: Normal   Collection Time   02/18/12  3:45 AM      Component Value Range Status Comment   Specimen Description BLOOD RIGHT HAND   Final    Special Requests BOTTLES DRAWN AEROBIC ONLY 3CC  Final    Culture  Setup Time [id number]   Final    Culture NO GROWTH 5 DAYS   Final    Report Status 02/24/2012 FINAL   Final   CULTURE, BLOOD (ROUTINE X 2)     Status: Normal (Preliminary result)   Collection Time   02/19/12  1:15 PM      Component Value Range Status Comment   Specimen Description BLOOD LEFT ARM   Final    Special Requests BOTTLES DRAWN AEROBIC ONLY 2CC   Final    Culture  Setup Time [id number]   Final    Culture     Final    Value:        BLOOD CULTURE RECEIVED NO GROWTH TO DATE CULTURE WILL BE HELD FOR 5 DAYS BEFORE ISSUING A FINAL NEGATIVE REPORT   Report Status PENDING   Incomplete   CULTURE, BLOOD (ROUTINE X 2)     Status: Normal (Preliminary result)   Collection  Time   02/19/12  1:24 PM      Component Value Range Status Comment   Specimen Description BLOOD RIGHT HAND   Final    Special Requests BOTTLES DRAWN AEROBIC AND ANAEROBIC 5CC   Final    Culture  Setup Time [id number]   Final    Culture     Final    Value:        BLOOD CULTURE RECEIVED NO GROWTH TO DATE CULTURE WILL BE HELD FOR 5 DAYS BEFORE ISSUING A FINAL NEGATIVE REPORT   Report Status PENDING   Incomplete   MRSA PCR SCREENING     Status: Normal   Collection Time   02/23/12  9:53 AM      Component Value Range Status Comment   MRSA by PCR NEGATIVE  NEGATIVE  Final     Studies/Results: Ct Abdomen Pelvis Wo Contrast  02/23/2012  *RADIOLOGY REPORT*  Clinical Data: Distended abdomen, nausea, vomiting and constipation.  CT ABDOMEN AND PELVIS WITHOUT CONTRAST  Technique:  Multidetector CT imaging of the abdomen and pelvis was performed following the standard protocol without intravenous contrast.  Comparison: CT of the abdomen and pelvis performed 05/02/2011, and renal ultrasound performed 02/18/2012  Findings: A small right pleural effusion is seen.  Left basilar airspace opacification raises concern for pneumonia.  Diffuse coronary artery calcifications are seen.  The liver and spleen are unremarkable in appearance.  The gallbladder is within normal limits.  The pancreas and adrenal glands are unremarkable.  The kidneys are unremarkable in appearance.  There is no evidence of hydronephrosis.  No renal or ureteral stones are seen.  No perinephric stranding is appreciated.  A small amount of free fluid is noted tracking along the right paracolic gutter.  There is mild diffuse distension of small bowel loops, to 4.2 cm in maximal diameter.  There is gradual dilution of contrast within the small bowel, with gradual decompression of the small bowel to the level of the terminal ileum.  Mild surrounding soft tissue stranding is noted, and fluid is seen tracking along small bowel loops. This could reflect  partial small bowel obstruction, or small bowel dysmotility; no definite focal transition point is characterized.  The stomach is within normal limits.  An enteric tube is noted ending at the antrum of the stomach.  No acute vascular abnormalities are seen.  Scattered calcification is noted along the abdominal aorta and its branches.  This is particularly prominent along the proximal superior mesenteric artery and right renal artery.  The appendix is normal in caliber, without evidence for appendicitis.  Residual contrast is noted within the colon; the colon is largely decompressed.  Asymmetric soft tissue edema is noted along the right lateral abdominal wall.  The bladder is decompressed, with a Foley catheter in place; scattered air within the bladder reflects Foley catheter placement. The prostate is mildly enlarged, measuring 4.9 cm in transverse dimension.  A small right inguinal hernia is noted, containing only fat.  No inguinal lymphadenopathy is seen.  No acute osseous abnormalities are identified.  Vacuum phenomenon and disc space narrowing are noted at L4-L5.  IMPRESSION:  1.  Diffuse distension of small bowel loops, to 4.2 cm in maximal diameter.  There is gradual decompression of the small bowel to the level of the terminal ileum, with mild surrounding soft tissue stranding and fluid tracking about small bowel loops.  This could reflect partial small bowel obstruction, or small bowel dysmotility; no definite focal transition point is seen to suggest high-grade obstruction. 2.  Decompression of the colon suggests bowel obstruction. 3.  Left basilar airspace opacification raises concern for pneumonia. 4.  Small amount of free fluid noted at the right side of the abdomen. 5.  Asymmetric soft tissue edema noted along the right lateral abdominal wall. 6.  Scattered calcification along the abdominal aorta and its branches, particularly prominent along the proximal superior mesenteric artery and right renal  artery. 7.  Small right pleural effusion seen. 8.  Diffuse coronary artery calcification noted. 9.  Small right inguinal hernia, containing only fat. 10.  Mildly enlarged prostate.  Original Report Authenticated By: Tonia Ghent, M.D.   Dg Abd Acute W/chest  02/22/2012  *RADIOLOGY REPORT*  Clinical Data: Bilious emesis.  ACUTE ABDOMEN SERIES (ABDOMEN 2 VIEW & CHEST 1 VIEW)  Comparison: 09/17/2010  Findings: Lung volumes are low.  There is left base atelectasis or infiltrate.  Subsegmental atelectasis is seen in the right midlung. The cardiopericardial silhouette is enlarged.  Right side up decubitus film shows no evidence for intraperitoneal free air.  The supine abdominal film shows diffuse gaseous small bowel dilatation measuring up to about 5 cm in diameter.  There is a paucity of colonic gas in the abdomen.  IMPRESSION: Left base atelectasis or infiltrate.  Diffuse gaseous small bowel dilatation, consistent with small bowel obstruction.  CT imaging of the abdomen and pelvis may prove helpful to further evaluate.  Original Report Authenticated By: ERIC A. MANSELL, M.D.    Medications: Scheduled Meds:   . cyanocobalamin  1,000 mcg Intramuscular Daily  . folic acid  1 mg Intravenous Daily  . insulin aspart  0-9 Units Subcutaneous TID WC  . metoprolol  5 mg Intravenous Q6H  . pantoprazole (PROTONIX) IV  40 mg Intravenous Q12H  . thiamine  100 mg Intravenous Daily  . DISCONTD: folic acid (FOLVITE) IVPB  1 mg Intravenous Once   Continuous Infusions:   . sodium chloride 125 mL/hr at 02/24/12 1212   PRN Meds:.morphine injection, ondansetron  Assessment/Plan:  Principal Problem:  *SBO (small bowel obstruction) Active Problems:  Acute renal failure  Iron deficiency anemia  B12 deficiency  Esophageal reflux disease  #1 small bowel obstruction: Agree with NG tube placement to suction. Management as per surgery.   #2 acute renal failure: On May 4 his creatinine was 3.8 it had decreased to  1.4 upon discharge yesterday, back to 1.4 today. He had normal creatinine levels prior to hospitalization on May 4. This should continue to improve with IV fluids.   #  3 iron deficiency and vitamin B 12 deficiency anemia: Is receiving intramuscular B12 shots, once oral route restored continue ferrous sulfate 325 mg twice daily.  #4 hyperglycemia: No history of diabetes. Suspect secondary to glucose that he was receiving IV fluids. Has already been switched over to normal saline by the surgical team. Continue sliding scale insulin.   #5 esophageal reflux disease: Continue proton pump inhibitor. Restart Carafate once by mouth route restored.   #6 history of alcohol use: Continue thiamine and folate.  At this point, there is nothing further that the medical service is contributing to treatment, however we will continue to follow from the periphery to make sure that his renal failure does not worsen.   LOS: 2 days   [facility] Pager: [phone] 02/24/2012, 1:48 PM

## 2012-02-24 NOTE — Progress Notes (Addendum)
General Surgery Note  LOS: 2 days  POD# NA Room 1223 GI - Dr. Berton Lan Hospitalist - Dr. Philip Aspen  Assessment/Plan: 1.  SBO (small bowel obstruction) vs ileus - in virgin abdomen.  Patient was in the hospital 02/17/2012 - 02/22/2012 with nausea, vomiting, pancreatitis (lipase as high as 1460 on 02/19/2012), went to [facility] and bounced right back.  Contrast in colon on CT scan - 02/23/2012.  2.  Acute renal failure   Creatinine - 1.44 - 02/24/2012   3.  Iron deficiency anemia   4.  B12 deficiency   5.  Esophageal reflux disease  6.  Malnutrition - Alb - 1.8 - 02/24/2012  It sounds like patient has not been taking care of himself and this predates this acute illness.  Will place PICC in anticipation of TPN. 7.  Small right inguinal hernia on CT scan 8.  EtOH abuse and cigarette abuse at home according to daughter, [name]. 9.  Has foley.   Subjective:  No flatus.  No abdominal pain.  Daughter, [name], in room, wants a phone call on daily progress.  I reviewed my understanding of his case. Objective:   Filed Vitals:   02/24/12 0700  BP: 102/59  Pulse: 93  Temp:   Resp: 13     Intake/Output from previous day:  05/10 0701 - 05/11 0700 In: 2862.1 [P.O.:250; I.V.:2602.1; IV Piggyback:10] Out: 3025 [Urine:1225; Emesis/NG output:1800]  Intake/Output this shift:      Physical Exam:   General: Older WM who is alert.    HEENT: Normal. Pupils equal. .   Lungs: Clear   Abdomen: Soft and doughy.  Few BS heard.   Wound: NA   Neurologic:  Grossly intact to motor and sensory function.   Psychiatric: Angry about NGT.   Lab Results:    Basename 02/24/12 0335 02/22/12 2351  WBC 5.9 9.5  HGB 8.2* 9.3*  HCT 23.8* 26.2*  PLT 297 309    BMET   Basename 02/24/12 0335 02/22/12 2351  NA 140 131*  K 3.7 3.9  CL 103 96  CO2 26 26  GLUCOSE 161* 285*  BUN 35* 40*  CREATININE 1.44* 1.67*  CALCIUM 6.6* 6.9*    PT/INR  No results found for this basename: LABPROT:2,INR:2  in the last 72 hours  ABG  No results found for this basename: PHART:2,PCO2:2,PO2:2,HCO3:2 in the last 72 hours   Studies/Results:  Ct Abdomen Pelvis Wo Contrast  02/23/2012  *RADIOLOGY REPORT*  Clinical Data: Distended abdomen, nausea, vomiting and constipation.  CT ABDOMEN AND PELVIS WITHOUT CONTRAST  Technique:  Multidetector CT imaging of the abdomen and pelvis was performed following the standard protocol without intravenous contrast.  Comparison: CT of the abdomen and pelvis performed 05/02/2011, and renal ultrasound performed 02/18/2012  Findings: A small right pleural effusion is seen.  Left basilar airspace opacification raises concern for pneumonia.  Diffuse coronary artery calcifications are seen.  The liver and spleen are unremarkable in appearance.  The gallbladder is within normal limits.  The pancreas and adrenal glands are unremarkable.  The kidneys are unremarkable in appearance.  There is no evidence of hydronephrosis.  No renal or ureteral stones are seen.  No perinephric stranding is appreciated.  A small amount of free fluid is noted tracking along the right paracolic gutter.  There is mild diffuse distension of small bowel loops, to 4.2 cm in maximal diameter.  There is gradual dilution of contrast within the small bowel, with gradual decompression of the small  bowel to the level of the terminal ileum.  Mild surrounding soft tissue stranding is noted, and fluid is seen tracking along small bowel loops. This could reflect partial small bowel obstruction, or small bowel dysmotility; no definite focal transition point is characterized.  The stomach is within normal limits.  An enteric tube is noted ending at the antrum of the stomach.  No acute vascular abnormalities are seen.  Scattered calcification is noted along the abdominal aorta and its branches.  This is particularly prominent along the proximal superior mesenteric artery and right renal artery.  The appendix is normal in caliber,  without evidence for appendicitis.  Residual contrast is noted within the colon; the colon is largely decompressed.  Asymmetric soft tissue edema is noted along the right lateral abdominal wall.  The bladder is decompressed, with a Foley catheter in place; scattered air within the bladder reflects Foley catheter placement. The prostate is mildly enlarged, measuring 4.9 cm in transverse dimension.  A small right inguinal hernia is noted, containing only fat.  No inguinal lymphadenopathy is seen.  No acute osseous abnormalities are identified.  Vacuum phenomenon and disc space narrowing are noted at L4-L5.  IMPRESSION:  1.  Diffuse distension of small bowel loops, to 4.2 cm in maximal diameter.  There is gradual decompression of the small bowel to the level of the terminal ileum, with mild surrounding soft tissue stranding and fluid tracking about small bowel loops.  This could reflect partial small bowel obstruction, or small bowel dysmotility; no definite focal transition point is seen to suggest high-grade obstruction. 2.  Decompression of the colon suggests bowel obstruction. 3.  Left basilar airspace opacification raises concern for pneumonia. 4.  Small amount of free fluid noted at the right side of the abdomen. 5.  Asymmetric soft tissue edema noted along the right lateral abdominal wall. 6.  Scattered calcification along the abdominal aorta and its branches, particularly prominent along the proximal superior mesenteric artery and right renal artery. 7.  Small right pleural effusion seen. 8.  Diffuse coronary artery calcification noted. 9.  Small right inguinal hernia, containing only fat. 10.  Mildly enlarged prostate.  Original Report Authenticated By: Tonia Ghent, M.D.   Dg Abd Acute W/chest  02/22/2012  *RADIOLOGY REPORT*  Clinical Data: Bilious emesis.  ACUTE ABDOMEN SERIES (ABDOMEN 2 VIEW & CHEST 1 VIEW)  Comparison: 09/17/2010  Findings: Lung volumes are low.  There is left base atelectasis or  infiltrate.  Subsegmental atelectasis is seen in the right midlung. The cardiopericardial silhouette is enlarged.  Right side up decubitus film shows no evidence for intraperitoneal free air.  The supine abdominal film shows diffuse gaseous small bowel dilatation measuring up to about 5 cm in diameter.  There is a paucity of colonic gas in the abdomen.  IMPRESSION: Left base atelectasis or infiltrate.  Diffuse gaseous small bowel dilatation, consistent with small bowel obstruction.  CT imaging of the abdomen and pelvis may prove helpful to further evaluate.  Original Report Authenticated By: ERIC A. MANSELL, M.D.     Anti-infectives:   Anti-infectives    None      Ovidio Kin, MD, FACS Pager: [phone],   [facility] Surgery Office: [phone] 02/24/2012

## 2012-02-24 NOTE — Progress Notes (Signed)
Attempted PICC.  Accessed Rt. Brachial vein and thread guidewire without difficulty.  Unable to thread PICC into SVC.  Will not drop down.  No suitable veins Left arm to attempt.  Recommend IR if PICC still desired.

## 2012-02-24 NOTE — Progress Notes (Signed)
[facility] Progress Note  Subjective: Continues to feel full and bloated moderate NG output clears ileus  Objective: Vital signs in last 24 hours: Temp:  [97.3 F (36.3 Moore)-98.2 F (36.8 Moore)] 98.1 F (36.7 Moore) (05/11 0800) Pulse Rate:  [73-132] 93  (05/11 0700) Resp:  [13-17] 13  (05/11 0700) BP: (96-120)/(46-68) 102/59 mmHg (05/11 0700) SpO2:  [92 %-99 %] 94 % (05/11 0700) Weight:  [102 kg (224 lb 13.9 oz)] 102 kg (224 lb 13.9 oz) (05/11 0130) Weight change:    PE: Abdomen soft symmetrically distended but not rigid  Lab Results: Results for orders placed during the hospital encounter of 02/22/12 (from the past 24 hour(s))  GLUCOSE, CAPILLARY     Status: Abnormal   Collection Time   02/23/12 12:40 PM      Component Value Range   Glucose-Capillary 194 (*) 70 - 99 (mg/dL)  GLUCOSE, CAPILLARY     Status: Abnormal   Collection Time   02/23/12  5:21 PM      Component Value Range   Glucose-Capillary 175 (*) 70 - 99 (mg/dL)   Comment 1 Documented in Chart     Comment 2 Notify RN    GLUCOSE, CAPILLARY     Status: Abnormal   Collection Time   02/23/12  9:36 PM      Component Value Range   Glucose-Capillary 153 (*) 70 - 99 (mg/dL)   Comment 1 Documented in Chart     Comment 2 Notify RN    CBC     Status: Abnormal   Collection Time   02/24/12  3:35 AM      Component Value Range   WBC 5.9  4.0 - 10.5 (K/uL)   RBC 2.47 (*) 4.22 - 5.81 (MIL/uL)   Hemoglobin 8.2 (*) 13.0 - 17.0 (g/dL)   HCT 16.1 (*) 09.6 - 52.0 (%)   MCV 96.4  78.0 - 100.0 (fL)   MCH 33.2  26.0 - 34.0 (pg)   MCHC 34.5  30.0 - 36.0 (g/dL)   RDW 04.5  40.9 - 81.1 (%)   Platelets 297  150 - 400 (K/uL)  COMPREHENSIVE METABOLIC PANEL     Status: Abnormal   Collection Time   02/24/12  3:35 AM      Component Value Range   Sodium 140  135 - 145 (mEq/L)   Potassium 3.7  3.5 - 5.1 (mEq/L)   Chloride 103  96 - 112 (mEq/L)   CO2 26  19 - 32 (mEq/L)   Glucose, Bld 161 (*) 70 - 99 (mg/dL)   BUN 35 (*) 6 - 23  (mg/dL)   Creatinine, Ser 9.14 (*) 0.50 - 1.35 (mg/dL)   Calcium 6.6 (*) 8.4 - 10.5 (mg/dL)   Total Protein 4.8 (*) 6.0 - 8.3 (g/dL)   Albumin 1.8 (*) 3.5 - 5.2 (g/dL)   AST 28  0 - 37 (U/L)   ALT 18  0 - 53 (U/L)   Alkaline Phosphatase 68  39 - 117 (U/L)   Total Bilirubin 0.4  0.3 - 1.2 (mg/dL)   GFR calc non Af Amer 46 (*) >90 (mL/min)   GFR calc Af Amer 53 (*) >90 (mL/min)  MAGNESIUM     Status: Abnormal   Collection Time   02/24/12  3:35 AM      Component Value Range   Magnesium 1.4 (*) 1.5 - 2.5 (mg/dL)  LIPASE, BLOOD     Status: Normal   Collection Time   02/24/12  3:35 AM  Component Value Range   Lipase 32  11 - 59 (U/L)  GLUCOSE, CAPILLARY     Status: Abnormal   Collection Time   02/24/12  8:03 AM      Component Value Range   Glucose-Capillary 179 (*) 70 - 99 (mg/dL)    Studies/Results: Ct Abdomen Pelvis Wo Contrast  02/23/2012  *RADIOLOGY REPORT*  Clinical Data: Distended abdomen, nausea, vomiting and constipation.  CT ABDOMEN AND PELVIS WITHOUT CONTRAST  Technique:  Multidetector CT imaging of the abdomen and pelvis was performed following the standard protocol without intravenous contrast.  Comparison: CT of the abdomen and pelvis performed 05/02/2011, and renal ultrasound performed 02/18/2012  Findings: A small right pleural effusion is seen.  Left basilar airspace opacification raises concern for pneumonia.  Diffuse coronary artery calcifications are seen.  The liver and spleen are unremarkable in appearance.  The gallbladder is within normal limits.  The pancreas and adrenal glands are unremarkable.  The kidneys are unremarkable in appearance.  There is no evidence of hydronephrosis.  No renal or ureteral stones are seen.  No perinephric stranding is appreciated.  A small amount of free fluid is noted tracking along the right paracolic gutter.  There is mild diffuse distension of small bowel loops, to 4.2 cm in maximal diameter.  There is gradual dilution of contrast  within the small bowel, with gradual decompression of the small bowel to the level of the terminal ileum.  Mild surrounding soft tissue stranding is noted, and fluid is seen tracking along small bowel loops. This could reflect partial small bowel obstruction, or small bowel dysmotility; no definite focal transition point is characterized.  The stomach is within normal limits.  An enteric tube is noted ending at the antrum of the stomach.  No acute vascular abnormalities are seen.  Scattered calcification is noted along the abdominal aorta and its branches.  This is particularly prominent along the proximal superior mesenteric artery and right renal artery.  The appendix is normal in caliber, without evidence for appendicitis.  Residual contrast is noted within the colon; the colon is largely decompressed.  Asymmetric soft tissue edema is noted along the right lateral abdominal wall.  The bladder is decompressed, with a Foley catheter in place; scattered air within the bladder reflects Foley catheter placement. The prostate is mildly enlarged, measuring 4.9 cm in transverse dimension.  A small right inguinal hernia is noted, containing only fat.  No inguinal lymphadenopathy is seen.  No acute osseous abnormalities are identified.  Vacuum phenomenon and disc space narrowing are noted at L4-L5.  IMPRESSION:  1.  Diffuse distension of small bowel loops, to 4.2 cm in maximal diameter.  There is gradual decompression of the small bowel to the level of the terminal ileum, with mild surrounding soft tissue stranding and fluid tracking about small bowel loops.  This could reflect partial small bowel obstruction, or small bowel dysmotility; no definite focal transition point is seen to suggest high-grade obstruction. 2.  Decompression of the colon suggests bowel obstruction. 3.  Left basilar airspace opacification raises concern for pneumonia. 4.  Small amount of free fluid noted at the right side of the abdomen. 5.   Asymmetric soft tissue edema noted along the right lateral abdominal wall. 6.  Scattered calcification along the abdominal aorta and its branches, particularly prominent along the proximal superior mesenteric artery and right renal artery. 7.  Small right pleural effusion seen. 8.  Diffuse coronary artery calcification noted. 9.  Small right inguinal hernia, containing  only fat. 10.  Mildly enlarged prostate.  Original Report Authenticated By: Tonia Ghent, M.D.   Dg Abd Acute W/chest  02/22/2012  *RADIOLOGY REPORT*  Clinical Data: Bilious emesis.  ACUTE ABDOMEN SERIES (ABDOMEN 2 VIEW & CHEST 1 VIEW)  Comparison: 09/17/2010  Findings: Lung volumes are low.  There is left base atelectasis or infiltrate.  Subsegmental atelectasis is seen in the right midlung. The cardiopericardial silhouette is enlarged.  Right side up decubitus film shows no evidence for intraperitoneal free air.  The supine abdominal film shows diffuse gaseous small bowel dilatation measuring up to about 5 cm in diameter.  There is a paucity of colonic gas in the abdomen.  IMPRESSION: Left base atelectasis or infiltrate.  Diffuse gaseous small bowel dilatation, consistent with small bowel obstruction.  CT imaging of the abdomen and pelvis may prove helpful to further evaluate.  Original Report Authenticated By: ERIC A. MANSELL, M.D.      Assessment: Ileus versus small bowel obstruction  Plan: Repeat KUB today and tomorrow. We'll also and repeat lipase due to the question of pancreatitis and previous mild elevation of lipase. We'll continue to follow with you.    Preston Moore 02/24/2012, 11:59 AM

## 2012-02-25 ENCOUNTER — Inpatient Hospital Stay (HOSPITAL_COMMUNITY): Payer: Medicare Other

## 2012-02-25 LAB — BASIC METABOLIC PANEL
BUN: 29 mg/dL — ABNORMAL HIGH (ref 6–23)
CO2: 23 mEq/L (ref 19–32)
Calcium: 6 mg/dL — CL (ref 8.4–10.5)
Creatinine, Ser: 1.17 mg/dL (ref 0.50–1.35)
GFR calc non Af Amer: 59 mL/min — ABNORMAL LOW (ref >90)
Glucose, Bld: 132 mg/dL — ABNORMAL HIGH (ref 70–99)
Sodium: 142 mEq/L (ref 135–145)

## 2012-02-25 LAB — GLUCOSE, CAPILLARY
Glucose-Capillary: 129 mg/dL — ABNORMAL HIGH (ref 70–99)
Glucose-Capillary: 138 mg/dL — ABNORMAL HIGH (ref 70–99)
Glucose-Capillary: 162 mg/dL — ABNORMAL HIGH (ref 70–99)
Glucose-Capillary: 191 mg/dL — ABNORMAL HIGH (ref 70–99)

## 2012-02-25 LAB — CBC
Hemoglobin: 7.6 g/dL — ABNORMAL LOW (ref 13.0–17.0)
MCH: 32.8 pg (ref 26.0–34.0)
MCHC: 33.6 g/dL (ref 30.0–36.0)
MCV: 97.4 fL (ref 78.0–100.0)
Platelets: 314 10*3/uL (ref 150–400)

## 2012-02-25 LAB — CULTURE, BLOOD (ROUTINE X 2)
Culture  Setup Time: 201305062149
Culture: NO GROWTH

## 2012-02-25 LAB — DIFFERENTIAL
Basophils Relative: 0 % (ref 0–1)
Eosinophils Absolute: 0.1 10*3/uL (ref 0.0–0.7)
Lymphs Abs: 1.4 10*3/uL (ref 0.7–4.0)
Monocytes Absolute: 0.3 10*3/uL (ref 0.1–1.0)
Neutrophils Relative %: 72 % (ref 43–77)

## 2012-02-25 MED ORDER — INSULIN ASPART 100 UNIT/ML ~~LOC~~ SOLN
0.0000 [IU] | SUBCUTANEOUS | Status: DC
  Administered 2012-02-25: 2 [IU] via SUBCUTANEOUS
  Administered 2012-02-25 (×2): 1 [IU] via SUBCUTANEOUS
  Administered 2012-02-26: 3 [IU] via SUBCUTANEOUS
  Administered 2012-02-26: 2 [IU] via SUBCUTANEOUS
  Administered 2012-02-26: 5 [IU] via SUBCUTANEOUS
  Administered 2012-02-26: 3 [IU] via SUBCUTANEOUS

## 2012-02-25 MED ORDER — SODIUM CHLORIDE 0.9 % IV SOLN
INTRAVENOUS | Status: DC
  Administered 2012-02-25: 500 mL via INTRAVENOUS

## 2012-02-25 MED ORDER — POTASSIUM CHLORIDE IN NACL 20-0.45 MEQ/L-% IV SOLN
INTRAVENOUS | Status: DC
  Administered 2012-02-25 – 2012-02-26 (×2): 1000 mL via INTRAVENOUS
  Administered 2012-02-27 (×2): via INTRAVENOUS
  Filled 2012-02-25 (×8): qty 1000

## 2012-02-25 MED ORDER — CLINIMIX E/DEXTROSE (5/15) 5 % IV SOLN
INTRAVENOUS | Status: AC
  Administered 2012-02-25: 18:00:00 via INTRAVENOUS
  Filled 2012-02-25: qty 1000

## 2012-02-25 MED ORDER — POTASSIUM CHLORIDE 10 MEQ/100ML IV SOLN
10.0000 meq | INTRAVENOUS | Status: AC
  Administered 2012-02-25 (×3): 10 meq via INTRAVENOUS
  Filled 2012-02-25 (×3): qty 100

## 2012-02-25 MED ORDER — POTASSIUM CHLORIDE IN NACL 20-0.45 MEQ/L-% IV SOLN
INTRAVENOUS | Status: DC
  Administered 2012-02-25: 1000 mL via INTRAVENOUS
  Filled 2012-02-25: qty 1000

## 2012-02-25 NOTE — Procedures (Signed)
Procedure:  PICC line Findings:  Ultrasound guided access of right brachial vein.  6 Fr triple lumen PICC, 42 cm placed.  Tip at cavoatrial junction; confirmed by fluoro.

## 2012-02-25 NOTE — Progress Notes (Signed)
Preston Moore has produced 4 BM's today with each getting larger.   For exercise he has ambulated to the bedside commode and sat in the recliner and was allowed back in bed around 17:00. With each ambulation he got stronger and on returning to bed assistance was minimal.   If not insistent on requiring him to participate he will allow others to do for him. This mentality was not acepted today. He did accept responsibility for doing things for himself and was cheered on by staff and family.

## 2012-02-25 NOTE — Progress Notes (Signed)
General Surgery Note  LOS: 3 days  POD# NA Room 1223 GI - Dr. Berton Lan Hospitalist - Dr. Philip Aspen  Assessment/Plan: 1.  SBO (small bowel obstruction) vs ileus - in virgin abdomen.  Patient was in the hospital 02/17/2012 - 02/22/2012 with nausea, vomiting, pancreatitis (lipase as high as 1460 on 02/19/2012), went to [facility] and bounced right back.  Contrast in colon on CT scan - 02/23/2012.  Persistent dilated SB on KUB - 02/25/2012  Plan:  Cont NGT and IVF.  Patient has moderate dark pasty stool.  Patient has no motivation and does not help nursing at all.  I discussed with daughter, [name], for 20 minutes the plan and options.  She has power of atty.  We talked about long term plans - nursing facility and care.    2.  Acute renal failure   Creatinine - 1.17 - 02/25/2012   3.  Iron deficiency anemia   Hgb - 7.6 - 02/25/2012  4.  B12 deficiency   5.  Esophageal reflux disease  6.  Malnutrition - Alb - 1.8 - 02/24/2012  It sounds like patient has not been taking care of himself and this predates this acute illness.  The could not get PICC at bedside yesterday.  Will place in IR.  TPN ordered. 7.  Small right inguinal hernia on CT scan 8.  EtOH abuse and cigarette abuse at home according to daughter, [name]. 9.  Has foley.  10.  Hypokalemia - 3.4 - 02/25/2012. Will replace. 11.  Very amotivated.  Will order PT to get him moving (if possible).  Subjective:  No flatus.  No abdominal pain.  Daughter, [name], in room, wants a phone call on daily progress. Objective:   Filed Vitals:   02/25/12 0600  BP: 115/70  Pulse: 88  Temp:   Resp: 20     Intake/Output from previous day:  05/11 0701 - 05/12 0700 In: 2660 [P.O.:25; I.V.:2625; IV Piggyback:10] Out: 1950 [Urine:850; Emesis/NG output:1100]  Intake/Output this shift:      Physical Exam:   General: Older WM who is alert.    HEENT: Normal. Pupils equal. .   Lungs: Clear   Abdomen: Soft and doughy.  Few BS heard.   Rectal with black stool, no impaction.   Wound: NA   Neurologic:  Grossly intact to motor and sensory function.   Psychiatric: No very engaged.  Passive.   Lab Results:     Basename 02/25/12 0320 02/24/12 0335  WBC 6.3 5.9  HGB 7.6* 8.2*  HCT 22.6* 23.8*  PLT 314 297    BMET    Basename 02/25/12 0320 02/24/12 0335  NA 142 140  K 3.4* 3.7  CL 107 103  CO2 23 26  GLUCOSE 132* 161*  BUN 29* 35*  CREATININE 1.17 1.44*  CALCIUM 6.0* 6.6*    PT/INR  No results found for this basename: LABPROT:2,INR:2 in the last 72 hours  ABG  No results found for this basename: PHART:2,PCO2:2,PO2:2,HCO3:2 in the last 72 hours   Studies/Results:  Dg Abd 2 Views  02/24/2012  *RADIOLOGY REPORT*  Clinical Data: Abdominal pain and distention  ABDOMEN - 2 VIEW  Comparison: CT the 02/23/2012  Findings: There is an NG tube in the stomach.  There are multiple dilated loops of small bowel measuring up to 4.5 cm in diameter. There are multiple air-fluid levels seen on the lateral decubitus view.  No evidence of intraperitoneal free air.  The degree of small bowel dilatation appears  greater than comparison CT.  There is oral contrast within the rectum from prior CT scan.  IMPRESSION:  Small bowel dilatation is increased compared to CT 02/2012 and similar to plain film of 02/22/2012.  Findings consistent with worsening small bowel obstruction versus ileus.  Original Report Authenticated By: Genevive Bi, M.D.     Anti-infectives:   Anti-infectives    None      Preston Kin, MD, FACS Pager: [phone],   [facility] Surgery Office: [phone] 02/25/2012

## 2012-02-25 NOTE — Progress Notes (Addendum)
CRITICAL VALUE ALERT  Critical value received:  Calcium 6.0  Date of notification:  02/25/12  Time of notification:  0445  Critical value read back:yes Nurse who received alert:  Barkley Bruns RN CCRN  MD notified (1st page): Dr. Darrick Penna  Time of first page:  [phone]  MD notified (2nd page):n/a  Time of second page:n/a  Responding MD:   Deter  Time MD

## 2012-02-25 NOTE — Progress Notes (Signed)
INITIAL ADULT NUTRITION ASSESSMENT Date: 02/25/2012   Time: 12:40 PM Reason for Assessment: Consult- new tpn  ASSESSMENT: Male 78 y.o.  Dx: SBO (small bowel obstruction) admit 5/4-5/9 for pancreatitis, re-admit in <24 hours from [facility]; acute renal failure, iron deficiency anemia, vitamin B-12 deficiency  Hx:  Past Medical History  Diagnosis Date  . Atrial flutter   . Diabetes mellitus   . Neuropathy   . Hyperlipidemia   . Syncope and collapse   . Hypertension    Past Surgical History  Procedure Date  . Laminectomy 1965  . Back surgery     Related Meds:     . cyanocobalamin  1,000 mcg Intramuscular Daily  . folic acid  1 mg Intravenous Daily  . insulin aspart  0-9 Units Subcutaneous Q4H  . LORazepam      . LORazepam  0.5 mg Intravenous Once  . metoprolol  5 mg Intravenous Q6H  . pantoprazole (PROTONIX) IV  40 mg Intravenous Q12H  . potassium chloride  10 mEq Intravenous Q1 Hr x 3  . thiamine  100 mg Intravenous Daily  . DISCONTD: insulin aspart  0-9 Units Subcutaneous TID WC     Ht: 5\' 9"  (175.3 cm)  Wt: 224 lb 13.9 oz (102 kg)  Ideal Wt: 73 kg % Ideal Wt: 140  Usual Wt:  Wt Readings from Last 10 Encounters:  02/24/12 224 lb 13.9 oz (102 kg)  02/18/12 200 lb (90.719 kg)  08/08/11 200 lb (90.719 kg)  07/11/11 201 lb (91.173 kg)  06/26/11 207 lb 9.6 oz (94.167 kg)  06/08/11 206 lb (93.441 kg)  12/12/10 206 lb (93.441 kg)  11/01/10 212 lb (96.163 kg)  10/06/10 206 lb (93.441 kg)  % Usual Wt: 112  Body mass index is 33.21 kg/(m^2).  Food/Nutrition Related Hx: Per Daughter, pt would only eat bites for some time now.  Drank 1/2 gallon of hard liquor every week.  Labs:  CMP     Component Value Date/Time   NA 142 02/25/2012 0320   K 3.4* 02/25/2012 0320   CL 107 02/25/2012 0320   CO2 23 02/25/2012 0320   GLUCOSE 132* 02/25/2012 0320   BUN 29* 02/25/2012 0320   CREATININE 1.17 02/25/2012 0320   CALCIUM 6.0* 02/25/2012 0320   PROT 4.8* 02/24/2012 0335     ALBUMIN 1.8* 02/24/2012 0335   AST 28 02/24/2012 0335   ALT 18 02/24/2012 0335   ALKPHOS 68 02/24/2012 0335   BILITOT 0.4 02/24/2012 0335   GFRNONAA 59* 02/25/2012 0320   GFRAA 69* 02/25/2012 0320  Vitamin B-12=110- vitamine b-12 injection added  I/O last 3 completed shifts: In: 3795 [P.O.:25; I.V.:3750; IV Piggyback:20] Out: 2775 [Urine:1325; Emesis/NG output:1450] Total I/O In: -  Out: 450 [Urine:150; Emesis/NG output:300]   Diet Order: NPO except for ice chips  Supplements/Tube Feeding:none  IVF:    0.45 % NaCl with KCl 20 mEq / L   sodium chloride Last Rate: 500 mL (02/25/12 1138)  TPN (CLINIMIX) +/- additives   DISCONTD: 0.45 % NaCl with KCl 20 mEq / L Last Rate: 1,000 mL (02/25/12 0836)  DISCONTD: sodium chloride Last Rate: 1,000 mL (02/25/12 0415)    Estimated Nutritional Needs:per day   Kcal: 1800-200 Protein: 105-115 g protein Fluid: >1.8L  Pt "ravinously hungry" and asking when diet can be advanced.  NG tube, eating ice chips.  Pt meets criteria for severe malnutrition related to acute illness and ETOH abuse (social) AEB edema in hands, feet, and legs, decreased muscle  mass, <50% intake for the past 5 days and <75% intake for the past month. Pt drank 1/2 gallon of hard liquor every week.  Vit B-12 injection added for Vit B-12 deficiency.  Folic acid and thiamine added as well.  BM today. Weight increased above UBW secondary to edema, and lack of bowel movement.  NUTRITION DIAGNOSIS: -Inadequate oral intake (NI-2.1).  Status: Ongoing  RELATED TO: altered GI function  AS EVIDENCE BY: npo status  MONITORING/EVALUATION(Goals): Monitor:  Diet advancement, diet tolerance, intake, tpn, labs weight Goal:  tpn to meet estimated needs,  Tolerate diet advancement.  EDUCATION NEEDS: -No education needs identified at this time  INTERVENTION: 1.  TPN(Clinimix E) to begin today at 40 ml/hr today.  Goal per pharmacy of TPN=90 ml/hr + 20% lipids at 20 ml/hr (MWF) to provide  an average of 1739 kcal, 108 g daily.   2.  Rec tpn goal of 95 ml/hr to provide and average of 1825 kcal, 114 g protein 3.  Monitor diet advancement  Dietitian [phone]  DOCUMENTATION CODES Per approved criteria  -Severe malnutrition in the context of acute illness or injury -Severe malnutrition in the context of chronic illness -Obesity -grade 1    Kessa Fairbairn, Anastasia Fiedler 02/25/2012, 12:40 PM

## 2012-02-25 NOTE — Progress Notes (Signed)
PARENTERAL NUTRITION CONSULT NOTE - INITIAL  Pharmacy Consult for TNA Indication: SBO vs ileus  No Known Allergies  Patient Measurements: Height: 5\' 9"  (175.3 cm) Weight: 224 lb 13.9 oz (102 kg) IBW/kg (Calculated) : 70.7  Adjusted Body Weight: 80 kg Usual Weight: 102 kg  Vital Signs: Temp: 97.9 F (36.6 C) (05/12 0800) Temp src: Oral (05/12 0800) BP: 115/70 mmHg (05/12 0600) Pulse Rate: 88  (05/12 0600) Intake/Output from previous day: 05/11 0701 - 05/12 0700 In: 2660 [P.O.:25; I.V.:2625; IV Piggyback:10] Out: 1950 [Urine:850; Emesis/NG output:1100] Intake/Output from this shift: Total I/O In: -  Out: 150 [Urine:150]  Labs:  [facility] 02/25/12 0320 02/24/12 0335 02/22/12 2351  WBC 6.3 5.9 9.5  HGB 7.6* 8.2* 9.3*  HCT 22.6* 23.8* 26.2*  PLT 314 297 309  APTT -- -- --  INR -- -- --     Basename 02/25/12 0320 02/24/12 0335 02/22/12 2351  NA 142 140 131*  K 3.4* 3.7 3.9  CL 107 103 96  CO2 23 26 26   GLUCOSE 132* 161* 285*  BUN 29* 35* 40*  CREATININE 1.17 1.44* 1.67*  LABCREA -- -- --  CREAT24HRUR -- -- --  CALCIUM 6.0* 6.6* 6.9*  MG -- 1.4* --  PHOS -- -- --  PROT -- 4.8* 5.2*  ALBUMIN -- 1.8* 2.0*  AST -- 28 21  ALT -- 18 16  ALKPHOS -- 68 73  BILITOT -- 0.4 0.5  BILIDIR -- -- --  IBILI -- -- --  PREALBUMIN -- -- --  TRIG -- -- --  CHOLHDL -- -- --  CHOL -- -- --   Estimated Creatinine Clearance: 64.2 ml/min (by C-G formula based on Cr of 1.17).    Basename 02/25/12 0759 02/24/12 2129 02/24/12 1614  GLUCAP 129* 136* 168*    Medical History: Past Medical History  Diagnosis Date  . Atrial flutter   . Diabetes mellitus   . Neuropathy   . Hyperlipidemia   . Syncope and collapse   . Hypertension     Medications:  Scheduled:    . cyanocobalamin  1,000 mcg Intramuscular Daily  . folic acid  1 mg Intravenous Daily  . insulin aspart  0-9 Units Subcutaneous Q4H  . LORazepam      . LORazepam  0.5 mg Intravenous Once  . metoprolol  5 mg  Intravenous Q6H  . pantoprazole (PROTONIX) IV  40 mg Intravenous Q12H  . potassium chloride  10 mEq Intravenous Q1 Hr x 3  . thiamine  100 mg Intravenous Daily  . DISCONTD: insulin aspart  0-9 Units Subcutaneous TID WC    Insulin Requirements in the past 24 hours:  Single CBG yesterday 161-no insulin charted per sensitive scale  Current Nutrition:  NPO with NG tube in place.  Assessment:  Previous admit 5/4-5/9 for pancreatitis. Re-admit in < 24 hr from [facility]  Hx ETOH abuse, GERD, malnourishment, renal insufficiency last admit much improved.  Hx DM, serum glucose this am 132 gm/dl  Nutritional Goals: Goal rate TNA 90 ml/hr + Lipids 20% at 28ml/hr (MWF only) 1800-2000 kCal, 108 grams of protein per day Dietician consult ordered.  Plan:  Begin TNA at 81ml/hr TNA labs ordered Change CBG coverage to q4h with sensitive scale Consider Folic acid/Thiamine in TNA  Otho Bellows PharmD Pager [phone] 02/25/2012,11:15 AM

## 2012-02-26 LAB — COMPREHENSIVE METABOLIC PANEL
Albumin: 1.6 g/dL — ABNORMAL LOW (ref 3.5–5.2)
Alkaline Phosphatase: 63 U/L (ref 39–117)
BUN: 19 mg/dL (ref 6–23)
CO2: 22 mEq/L (ref 19–32)
Chloride: 106 mEq/L (ref 96–112)
Creatinine, Ser: 1 mg/dL (ref 0.50–1.35)
GFR calc Af Amer: 83 mL/min — ABNORMAL LOW (ref >90)
GFR calc non Af Amer: 71 mL/min — ABNORMAL LOW (ref >90)
Glucose, Bld: 254 mg/dL — ABNORMAL HIGH (ref 70–99)
Potassium: 3.1 mEq/L — ABNORMAL LOW (ref 3.5–5.1)
Total Bilirubin: 0.3 mg/dL (ref 0.3–1.2)

## 2012-02-26 LAB — DIFFERENTIAL
Basophils Absolute: 0 10*3/uL (ref 0.0–0.1)
Basophils Absolute: 0 10*3/uL (ref 0.0–0.1)
Basophils Relative: 0 % (ref 0–1)
Basophils Relative: 0 % (ref 0–1)
Eosinophils Absolute: 0 10*3/uL (ref 0.0–0.7)
Lymphocytes Relative: 24 % (ref 12–46)
Monocytes Absolute: 0.3 10*3/uL (ref 0.1–1.0)
Monocytes Absolute: 0.3 10*3/uL (ref 0.1–1.0)
Monocytes Relative: 5 % (ref 3–12)
Neutro Abs: 4 10*3/uL (ref 1.7–7.7)
Neutrophils Relative %: 71 % (ref 43–77)
Neutrophils Relative %: 69 % (ref 43–77)

## 2012-02-26 LAB — TRIGLYCERIDES: Triglycerides: 85 mg/dL (ref <150)

## 2012-02-26 LAB — CBC
Hemoglobin: 6.7 g/dL — CL (ref 13.0–17.0)
MCH: 33 pg (ref 26.0–34.0)
MCHC: 33.8 g/dL (ref 30.0–36.0)
MCHC: 33.3 g/dL (ref 30.0–36.0)
Platelets: 273 10*3/uL (ref 150–400)
RDW: 14.1 % (ref 11.5–15.5)
RDW: 14.3 % (ref 11.5–15.5)
WBC: 5.8 10*3/uL (ref 4.0–10.5)

## 2012-02-26 LAB — GLUCOSE, CAPILLARY
Glucose-Capillary: 189 mg/dL — ABNORMAL HIGH (ref 70–99)
Glucose-Capillary: 223 mg/dL — ABNORMAL HIGH (ref 70–99)
Glucose-Capillary: 279 mg/dL — ABNORMAL HIGH (ref 70–99)

## 2012-02-26 LAB — PHOSPHORUS: Phosphorus: 1.3 mg/dL — ABNORMAL LOW (ref 2.3–4.6)

## 2012-02-26 LAB — PREPARE RBC (CROSSMATCH)

## 2012-02-26 LAB — MAGNESIUM: Magnesium: 1.3 mg/dL — ABNORMAL LOW (ref 1.5–2.5)

## 2012-02-26 MED ORDER — MAGNESIUM SULFATE 40 MG/ML IJ SOLN
2.0000 g | Freq: Once | INTRAMUSCULAR | Status: AC
  Administered 2012-02-26: 2 g via INTRAVENOUS
  Filled 2012-02-26: qty 50

## 2012-02-26 MED ORDER — POTASSIUM CHLORIDE 10 MEQ/50ML IV SOLN
10.0000 meq | INTRAVENOUS | Status: AC
  Administered 2012-02-26 (×5): 10 meq via INTRAVENOUS
  Filled 2012-02-26 (×5): qty 50

## 2012-02-26 MED ORDER — POTASSIUM PHOSPHATE DIBASIC 3 MMOLE/ML IV SOLN
20.0000 mmol | Freq: Once | INTRAVENOUS | Status: AC
  Administered 2012-02-26: 20 mmol via INTRAVENOUS
  Filled 2012-02-26: qty 6.67

## 2012-02-26 MED ORDER — TRACE MINERALS CR-CU-MN-SE-ZN 10-1000-500-60 MCG/ML IV SOLN
INTRAVENOUS | Status: AC
  Administered 2012-02-26: 18:00:00 via INTRAVENOUS
  Filled 2012-02-26: qty 1000

## 2012-02-26 MED ORDER — CALCIUM GLUCONATE 10 % IV SOLN
1.0000 g | Freq: Once | INTRAVENOUS | Status: AC
  Administered 2012-02-26: 1 g via INTRAVENOUS
  Filled 2012-02-26: qty 10

## 2012-02-26 MED ORDER — SODIUM CHLORIDE 0.9 % IJ SOLN
10.0000 mL | INTRAMUSCULAR | Status: DC | PRN
  Administered 2012-02-26 – 2012-03-01 (×5): 10 mL

## 2012-02-26 MED ORDER — DARBEPOETIN ALFA-POLYSORBATE 25 MCG/0.42ML IJ SOLN
25.0000 ug | INTRAMUSCULAR | Status: DC
  Administered 2012-02-26: 25 ug via SUBCUTANEOUS
  Filled 2012-02-26: qty 0.42

## 2012-02-26 MED ORDER — INSULIN ASPART 100 UNIT/ML ~~LOC~~ SOLN
0.0000 [IU] | SUBCUTANEOUS | Status: DC
  Administered 2012-02-26 (×2): 5 [IU] via SUBCUTANEOUS
  Administered 2012-02-26: 8 [IU] via SUBCUTANEOUS
  Administered 2012-02-27: 5 [IU] via SUBCUTANEOUS
  Administered 2012-02-27: 8 [IU] via SUBCUTANEOUS
  Administered 2012-02-27: 5 [IU] via SUBCUTANEOUS
  Administered 2012-02-27: 8 [IU] via SUBCUTANEOUS
  Administered 2012-02-27 – 2012-02-28 (×4): 3 [IU] via SUBCUTANEOUS
  Administered 2012-02-28 (×4): 5 [IU] via SUBCUTANEOUS
  Administered 2012-02-29: 8 [IU] via SUBCUTANEOUS
  Administered 2012-02-29: 5 [IU] via SUBCUTANEOUS
  Administered 2012-02-29: 8 [IU] via SUBCUTANEOUS
  Administered 2012-02-29: 3 [IU] via SUBCUTANEOUS
  Administered 2012-02-29: 5 [IU] via SUBCUTANEOUS
  Administered 2012-02-29: 8 [IU] via SUBCUTANEOUS
  Administered 2012-03-01: 3 [IU] via SUBCUTANEOUS
  Administered 2012-03-01 (×2): 5 [IU] via SUBCUTANEOUS
  Administered 2012-03-01: 8 [IU] via SUBCUTANEOUS

## 2012-02-26 MED ORDER — FAT EMULSION 20 % IV EMUL
240.0000 mL | INTRAVENOUS | Status: AC
  Administered 2012-02-26: 240 mL via INTRAVENOUS
  Filled 2012-02-26: qty 250

## 2012-02-26 MED ORDER — SODIUM CHLORIDE 0.9 % IJ SOLN
10.0000 mL | Freq: Two times a day (BID) | INTRAMUSCULAR | Status: DC
  Administered 2012-02-26 – 2012-02-29 (×2): 10 mL

## 2012-02-26 NOTE — Clinical Social Work Placement (Signed)
Clinical Social Work Department CLINICAL SOCIAL WORK PLACEMENT NOTE 02/26/2012  Patient:  FENRIS, CAUBLE  Account Number:  [id number] Admit date:  02/22/2012  Clinical Social Worker:  Jodelle Red  Date/time:  02/26/2012 11:16 AM  Clinical Social Work is seeking post-discharge placement for this patient at the following level of care:   SKILLED NURSING   (*CSW will update this form in Epic as items are completed)   02/26/2012  Patient/family provided with [facility] Department of Clinical Social Work's list of facilities offering this level of care within the geographic area requested by the patient (or if unable, by the patient's family).  02/26/2012  Patient/family informed of their freedom to choose among providers that offer the needed level of care, that participate in Medicare, Medicaid or managed care program needed by the patient, have an available bed and are willing to accept the patient.  02/26/2012  Patient/family informed of [facility]' ownership interest in [facility], as well as of the fact that they are under no obligation to receive care at this facility.  PASARR submitted to EDS on 02/26/2012 PASARR number received from EDS on 02/26/2012  FL2 transmitted to all facilities in geographic area requested by pt/family on   FL2 transmitted to all facilities within larger geographic area on   Patient informed that his/her managed care company has contracts with or will negotiate with  certain facilities, including the following:     Patient/family informed of bed offers received:   Patient chooses bed at  Physician recommends and patient chooses bed at    Patient to be transferred to  on   Patient to be transferred to facility by   The following physician request were entered in Epic:   Additional Comments:  Vennie Homans, LCSWA 02/26/2012 11:18 AM [phone]

## 2012-02-26 NOTE — Progress Notes (Signed)
Subjective: He pulled his ng tube out.  Has passed gas and had 4 dark BMs.  No N/V.  Refuses a blood transfusion.  Objective: Vital signs in last 24 hours: Temp:  [97.8 F (36.6 C)-98.5 F (36.9 C)] 98.2 F (36.8 C) (05/13 0800) Pulse Rate:  [25-100] 81  (05/13 0600) Resp:  [17-23] 19  (05/13 0600) BP: (83-127)/(41-69) 110/50 mmHg (05/13 0600) SpO2:  [72 %-100 %] 98 % (05/13 0600) Weight:  [228 lb 2.8 oz (103.5 kg)] 228 lb 2.8 oz (103.5 kg) (05/13 0400) Last BM Date: 02/24/12  Intake/Output from previous day: 05/12 0701 - 05/13 0700 In: 3553.5 [P.O.:340; I.V.:2347.7; IV Piggyback:310; TPN:489.3] Out: 2010 [Urine:1110; Emesis/NG output:900] Intake/Output this shift:    PE: CV-RRR Lungs-clear Abd-soft, nontender Skin-pale  Lab Results:   Basename 02/26/12 0834 02/26/12 0515  WBC 5.8 5.1  HGB 6.7* 6.7*  HCT 20.1* 19.8*  PLT 273 231   BMET  Basename 02/26/12 0515 02/25/12 0320  NA 138 142  K 3.1* 3.4*  CL 106 107  CO2 22 23  GLUCOSE 254* 132*  BUN 19 29*  CREATININE 1.00 1.17  CALCIUM 6.3* 6.0*   PT/INR No results found for this basename: LABPROT:2,INR:2 in the last 72 hours Comprehensive Metabolic Panel:    Component Value Date/Time   NA 138 02/26/2012 0515   K 3.1* 02/26/2012 0515   CL 106 02/26/2012 0515   CO2 22 02/26/2012 0515   BUN 19 02/26/2012 0515   CREATININE 1.00 02/26/2012 0515   GLUCOSE 254* 02/26/2012 0515   CALCIUM 6.3* 02/26/2012 0515   AST 21 02/26/2012 0515   ALT 17 02/26/2012 0515   ALKPHOS 63 02/26/2012 0515   BILITOT 0.3 02/26/2012 0515   PROT 4.4* 02/26/2012 0515   ALBUMIN 1.6* 02/26/2012 0515     Studies/Results: Ir Fluoro Guide Cv Line Right  02/25/2012  *RADIOLOGY REPORT*  Clinical Data: Bowel obstruction and requirement of central line to begin parenteral nutrition.  There has been inability to successfully place a central catheter at the bedside.  PICC LINE PLACEMENT WITH ULTRASOUND AND FLUOROSCOPIC  GUIDANCE  Fluoroscopy Time: 0.1  minutes.  The right arm was prepped with chlorhexidine, draped in the usual sterile fashion using maximum barrier technique (cap and mask, sterile gown, sterile gloves, large sterile sheet, hand hygiene and cutaneous antisepsis) and infiltrated locally with 1% Lidocaine.  Ultrasound demonstrated patency of the right brachial vein, and this was documented with an image.  Under real-time ultrasound guidance, this vein was accessed with a 21 gauge micropuncture needle and image documentation was performed.  The needle was exchanged over a guidewire for a peel-away sheath through which a 6 [place] triple lumen PICC trimmed to 42 cm was advanced, positioned with its tip at the lower SVC/right atrial junction.  Fluoroscopy during the procedure and fluoro spot radiograph confirms appropriate catheter position.  The catheter was flushed, secured to the skin with Prolene sutures, and covered with a sterile dressing.  Complications:  None  IMPRESSION: Successful right arm PICC line placement with ultrasound and fluoroscopic guidance.  The catheter is ready for use.  Original Report Authenticated By: Reola Calkins, M.D.   Ir US Guide Vasc Access Right  02/25/2012  *RADIOLOGY REPORT*  Clinical Data: Bowel obstruction and requirement of central line to begin parenteral nutrition.  There has been inability to successfully place a central catheter at the bedside.  PICC LINE PLACEMENT WITH ULTRASOUND AND FLUOROSCOPIC  GUIDANCE  Fluoroscopy Time: 0.1 minutes.  The right  arm was prepped with chlorhexidine, draped in the usual sterile fashion using maximum barrier technique (cap and mask, sterile gown, sterile gloves, large sterile sheet, hand hygiene and cutaneous antisepsis) and infiltrated locally with 1% Lidocaine.  Ultrasound demonstrated patency of the right brachial vein, and this was documented with an image.  Under real-time ultrasound guidance, this vein was accessed with a 21 gauge micropuncture needle and image  documentation was performed.  The needle was exchanged over a guidewire for a peel-away sheath through which a 6 [place] triple lumen PICC trimmed to 42 cm was advanced, positioned with its tip at the lower SVC/right atrial junction.  Fluoroscopy during the procedure and fluoro spot radiograph confirms appropriate catheter position.  The catheter was flushed, secured to the skin with Prolene sutures, and covered with a sterile dressing.  Complications:  None  IMPRESSION: Successful right arm PICC line placement with ultrasound and fluoroscopic guidance.  The catheter is ready for use.  Original Report Authenticated By: Reola Calkins, M.D.   Dg Abd 2 Views  02/25/2012  *RADIOLOGY REPORT*  Clinical Data:  Small bowel obstruction  ABDOMEN - 2 VIEW  Comparison: Plain film 02/25/2012  Findings: NG tube is in the stomach.  There is no intraperitoneal free air on the lateral projection.  Loops of small bowel are slightly decreased in diameter measuring approximately 4 cm compared to 5 cm on prior.  Again demonstrated stool within the rectum with oral contrast.  IMPRESSION:  Decreased diameter of the small bowel consistent with improved small bowel obstruction.  Original Report Authenticated By: Genevive Bi, M.D.   Dg Abd 2 Views  02/25/2012  *RADIOLOGY REPORT*  Clinical Data: Ileus or small bowel obstruction  ABDOMEN - 2 VIEW  Comparison: 02/24/2012  Findings: Persistent distended small bowel loops with air-fluid levels consistent with bowel obstruction or ileus.  Stool noted within rectum.  Degenerative changes lumbar spine again noted.  No free abdominal air on decubitus view. NG tube is unchanged in position.  IMPRESSION: Persistent distended small bowel loops with air-fluid levels consistent with bowel obstruction or ileus.  Original Report Authenticated By: Natasha Mead, M.D.   Dg Abd 2 Views  02/24/2012  *RADIOLOGY REPORT*  Clinical Data: Abdominal pain and distention  ABDOMEN - 2 VIEW  Comparison: CT  the 02/23/2012  Findings: There is an NG tube in the stomach.  There are multiple dilated loops of small bowel measuring up to 4.5 cm in diameter. There are multiple air-fluid levels seen on the lateral decubitus view.  No evidence of intraperitoneal free air.  The degree of small bowel dilatation appears greater than comparison CT.  There is oral contrast within the rectum from prior CT scan.  IMPRESSION:  Small bowel dilatation is increased compared to CT 02/2012 and similar to plain film of 02/22/2012.  Findings consistent with worsening small bowel obstruction versus ileus.  Original Report Authenticated By: Genevive Bi, M.D.    Anti-infectives: Anti-infectives    None      Assessment Principal Problem:  *SBO (small bowel obstruction) vs ileus-favor the latter. 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  Procrit.  Test stools for occult blood  and if positive will ask [facility] to see, if negative will ask Hematology to see.  PT/OT consults. Clear liquid diet.   Lindsie Simar J 02/26/2012

## 2012-02-26 NOTE — Evaluation (Signed)
Physical Therapy Evaluation Patient Details Name: Preston Moore MRN: [id number] DOB: 11-May-1934 Today's Date: 02/26/2012 Time: 8295-6213 PT Time Calculation (min): 21 min  PT Assessment / Plan / Recommendation Clinical Impression  pt was admitted after being DC'd to SNF w/ continued vomiting. Pt now to return to SNF when stable. Pt will benefit from PT to improve in endurance. strength and functional mobility to DC to SNF     PT Assessment  Patient needs continued PT services    Follow Up Recommendations  Skilled nursing facility    Barriers to Discharge        lEquipment Recommendations  Defer to next venue    Recommendations for Other Services     Frequency Min 3X/week    Precautions / Restrictions Precautions Precautions: Fall   Pertinent Vitals/Pain Max HR 127 w/ activity/ monitor alarmed w/ vtach      Mobility  Transfers Transfers: Sit to Stand;Stand to Sit Sit to Stand: 1: +2 Total assist;With upper extremity assist;From chair/3-in-1 Sit to Stand: Patient Percentage: 60% Stand to Sit: 3: Mod assist;To chair/3-in-1;With upper extremity assist Stand to Sit: Patient Percentage: 70% Details for Transfer Assistance: pt only stands for 1 minute intervals, pt / decreased endurance, pt does not warn when he plans to sit down. TC to reach for RW prior to standing/ Ambulation/Gait Ambulation/Gait Assistance: 1: +2 Total assist Ambulation/Gait: Patient Percentage: 50% Assistive device: Rolling walker Ambulation/Gait Assistance Details: attempted to take steps in place but pt did not really lift feet from floor.    Exercises General Exercises - Lower Extremity Long Arc Quad: AROM;Both;10 reps;Seated Hip Flexion/Marching: AAROM;Both;10 reps;Seated   PT Diagnosis: Generalized weakness  PT Problem List: Decreased strength;Decreased activity tolerance;Decreased mobility;Cardiopulmonary status limiting activity PT Treatment Interventions: DME instruction;Gait  training;Functional mobility training;Therapeutic exercise;Therapeutic activities;Patient/family education   PT Goals Acute Rehab PT Goals PT Goal Formulation: With patient/family Time For Goal Achievement: 03/11/12 Potential to Achieve Goals: Fair Pt will go Supine/Side to Sit: with min assist PT Goal: Supine/Side to Sit - Progress: Goal set today Pt will go Sit to Supine/Side: with min assist PT Goal: Sit to Supine/Side - Progress: Goal set today Pt will go Sit to Stand: with min assist PT Goal: Sit to Stand - Progress: Goal set today Pt will go Stand to Sit: with supervision PT Goal: Stand to Sit - Progress: Goal set today Pt will Transfer Bed to Chair/Chair to Bed: with min assist PT Transfer Goal: Bed to Chair/Chair to Bed - Progress: Goal set today Pt will Ambulate: 1 - 15 feet;with +2 total assist;with rolling walker PT Goal: Ambulate - Progress: Goal set today Pt will Perform Home Exercise Program: with supervision, verbal cues required/provided PT Goal: Perform Home Exercise Program - Progress: Goal set today  Visit Information  Last PT Received On: 02/26/12 Assistance Needed: +2    Subjective Data  Subjective: I feel dizzy Patient Stated Goal: Per daughter, to get therapy    Prior Functioning  Home Living Available Help at Discharge: Skilled Nursing Facility (had been DC from Hospital then came back w/in hours)    Cognition  Overall Cognitive Status: Impaired Area of Impairment: Attention;Memory;Problem solving Arousal/Alertness: Awake/alert Orientation Level: Disoriented to;Place;Time;Situation Behavior During Session: Flat affect Current Attention Level: Selective Safety/Judgement: Decreased awareness of need for assistance;Decreased safety judgement for tasks assessed    Extremity/Trunk Assessment Right Upper Extremity Assessment RUE ROM/Strength/Tone: [facility] for tasks assessed Left Upper Extremity Assessment LUE ROM/Strength/Tone: [facility] for tasks assessed Right  Lower Extremity Assessment RLE  ROM/Strength/Tone: Deficits RLE ROM/Strength/Tone Deficits: at least 3+/5 for standing activity Left Lower Extremity Assessment LLE ROM/Strength/Tone: Deficits LLE ROM/Strength/Tone Deficits: at least 3+/5   Balance Static Sitting Balance Static Sitting - Comment/# of Minutes: pt does require asssitance to right trunk from a sitting back in recliner.  End of Session PT - End of Session Activity Tolerance: Patient limited by fatigue Patient left: in chair;with call bell/phone within reach Nurse Communication: Mobility status   Rada Hay 02/26/2012, 12:46 PM  [phone]

## 2012-02-26 NOTE — Progress Notes (Addendum)
PARENTERAL NUTRITION CONSULT NOTE - FOLLOW UP  Pharmacy Consult for TNA Indication: SBO vs Ileus  No Known Allergies  Patient Measurements: Height: 5\' 9"  (175.3 cm) Weight: 228 lb 2.8 oz (103.5 kg) IBW/kg (Calculated) : 70.7  Adjusted Body Weight: 84  Vital Signs: Temp: 98.2 F (36.8 C) (05/13 0800) Temp src: Oral (05/13 0800) BP: 126/66 mmHg (05/13 0850) Pulse Rate: 90  (05/13 0850) Intake/Output from previous day: 05/12 0701 - 05/13 0700 In: 3553.5 [P.O.:340; I.V.:2347.7; IV Piggyback:310; TPN:489.3] Out: 2010 [Urine:1110; Emesis/NG output:900] Intake/Output from this shift:    Labs:  [facility] 02/26/12 0834 02/26/12 0515 02/25/12 0320  WBC 5.8 5.1 6.3  HGB 6.7* 6.7* 7.6*  HCT 20.1* 19.8* 22.6*  PLT 273 231 314  APTT -- -- --  INR -- -- --     Basename 02/26/12 0515 02/25/12 0320 02/24/12 0335  NA 138 142 140  K 3.1* 3.4* 3.7  CL 106 107 103  CO2 22 23 26   GLUCOSE 254* 132* 161*  BUN 19 29* 35*  CREATININE 1.00 1.17 1.44*  LABCREA -- -- --  CREAT24HRUR -- -- --  CALCIUM 6.3* 6.0* 6.6*  MG 1.3* -- 1.4*  PHOS 1.3* -- --  PROT 4.4* -- 4.8*  ALBUMIN 1.6* -- 1.8*  AST 21 -- 28  ALT 17 -- 18  ALKPHOS 63 -- 68  BILITOT 0.3 -- 0.4  BILIDIR -- -- --  IBILI -- -- --  PREALBUMIN -- -- --  TRIG 85 -- --  CHOLHDL -- -- --  CHOL 56 -- --   Estimated Creatinine Clearance: 75.7 ml/min (by C-G formula based on Cr of 1).  Corrected Ca 8.2  Basename 02/26/12 0732 02/26/12 0423 02/25/12 2325  GLUCAP 223* 244* 191*     Medications:  Scheduled:    . calcium gluconate  1 g Intravenous Once  . cyanocobalamin  1,000 mcg Intramuscular Daily  . darbepoetin  25 mcg Subcutaneous Q7 days  . folic acid  1 mg Intravenous Daily  . insulin aspart  0-9 Units Subcutaneous Q4H  . LORazepam      . magnesium sulfate 1 - 4 g bolus IVPB  2 g Intravenous Once  . metoprolol  5 mg Intravenous Q6H  . pantoprazole (PROTONIX) IV  40 mg Intravenous Q12H  . potassium chloride  10  mEq Intravenous Q1 Hr x 3  . potassium chloride  10 mEq Intravenous Q1 Hr x 5  . potassium phosphate IVPB (mmol)  20 mmol Intravenous Once  . thiamine  100 mg Intravenous Daily  . DISCONTD: insulin aspart  0-9 Units Subcutaneous TID WC   Infusions:    . 0.45 % NaCl with KCl 20 mEq / L 1,000 mL (02/25/12 1721)  . sodium chloride Stopped (02/25/12 1555)  . TPN (CLINIMIX) +/- additives 40 mL/hr at 02/25/12 1746  . DISCONTD: 0.45 % NaCl with KCl 20 mEq / L 1,000 mL (02/25/12 0836)   PRN: morphine injection, ondansetron  Insulin Requirements in the past 24 hours:  12 units SSI used since start of SSI 5/12 1445  Current Nutrition:  Clinimix E 5/15% @ 57ml/hr CL diet started this AM; patient pulled NG MIVF (half normal saline, plain) @ 80ml/hr  Assessment:  18 YOM admitted 5/9 who was recently here for pancreatitis (5/4-9) and then d/c to [facility] home. Readmitted within 24 hours of that discharge with vomiting, no BM.  Pt was started on TNA 5/12 PM for suspected SBO vs ileus, and PCM (Pt w/hx 0.5  gal hard ETOH PTA).  Scr was elevated but is now wnl (Scr 1 today < 2.68 on 5/7)  K is low, MD has ordered  KCL IV q1h x 5  Mg, Phos and Ca low  CBG elevated  Lipids wnl  Preablumin pending  Nutritional Goals:  Clinimix E 5/15% @ 49ml/hr + Lipid 69ml/hr MWF to provide and average of 1739kcal/d and 108g protein Rec tpn goal of 95 ml/hr to provide and average of 1825 kcal, 114 g protein  Plan:   Continue Clinimix E 5/15% @ 44ml/hr. Continue MIVF @ 16ml/hr. Will consider adding KCl to MIVF if K+ continues to be low. Will not advance until CBG and e-lytes have stabilized  Will order CaGlc  IV 1g x 1, Magnesium Sulfate IV 2g x 1 and Potassium Phosphate 20 mmol IV x 1  Increase SSI to moderately sensitive scale. Will consider Lantus if CBG remain elevated.  Follow up am BMET, Mg and Phos  Follow up Prealbumin when results are available  MVI/TE and Lipids MWF only d/t ongoing  shortages.  Gwen Her PharmD  [phone] 02/26/2012 10:48 AM

## 2012-02-26 NOTE — Clinical Social Work Note (Signed)
CSW continues to follow for return to SNF and met with Pt and daughter, [name] in ICU room to check in. PT/OT still needs to eval Pt. CSW will update FL2 and place in chart for signatures and follow for SNF.  Vennie Homans, [place] 02/26/2012 11:08 AM [phone]

## 2012-02-26 NOTE — Progress Notes (Signed)
Pt is refusing transfusion of PRBC. Stated on last admission he made the same decision and at this point has not changed his mind.   HgB = 6.7   Dr. Elveria Rising ordered a repeat CBC for verification.

## 2012-02-26 NOTE — Clinical Social Work Psych Assess (Signed)
Clinical Social Work Department BRIEF PSYCHOSOCIAL ASSESSMENT 02/26/2012  Patient:  Preston Moore, Preston Moore     Account Number:  [id number]     Admit date:  02/22/2012  Clinical Social Worker:  Jodelle Red  Date/Time:  02/26/2012 11:09 AM  Referred by:  CSW  Date Referred:  02/26/2012 Referred for  SNF Placement   Other Referral:   Interview type:  Family Other interview type:   CHART REVIEW, DISCUSSION WITH RN    PSYCHOSOCIAL DATA Living Status:  ALONE Admitted from facility:  [facility] Level of care:  Skilled Nursing Facility Primary support name:  BARBARA WALLS Primary support relationship to patient:  CHILD, ADULT Degree of support available:   GOOD FROM FOUR DAUGHTERS    CURRENT CONCERNS Current Concerns  Post-Acute Placement   Other Concerns:    SOCIAL WORK ASSESSMENT / PLAN CSW MET WITH DAUGHTERS ON FRIDAY BRIEFLY. PT HAD BOUNCED BACK FROM [facility] HOME. ONLY THERE A FEW HOURS. PLAN IS TO RETURN THERE IF THERE IS A BED AVAILABLE AT TIME OF D/C. DAUGHTERS AND PT COPING WELL. AGREE WITH PLAN FOR SNF.  CSW TO UPDATE FL2 AND FOLLOW.   Assessment/plan status:  Psychosocial Support/Ongoing Assessment of Needs Other assessment/ plan:   SNF PLACEMENT   Information/referral to community resources:    PATIENT'S/FAMILY'S RESPONSE TO PLAN OF CARE: DAUGHTERS AND PT COPING WELL. AGREE WITH PLAN FOR SNF.  CSW TO UPDATE FL2 AND FOLLOW.   Vennie Homans, [place] 02/26/2012 11:14 AM [phone]

## 2012-02-26 NOTE — Progress Notes (Signed)
CRITICAL VALUE ALERT  Critical value received:  6.3  Date of notification:  02/26/12  Time of notification:  07:55  Critical value read back: Yes  Nurse who received alert:  Guinevere Scarlet  MD notified (1st page):  T Rosenbower  Time of first page:  07:55  MD notified (2nd page): NA  Time of second page: NA  Responding MD:  NA  Time MD responded:  NA

## 2012-02-26 NOTE — Progress Notes (Signed)
CARE MANAGEMENT NOTE 02/26/2012  Patient:  Preston Moore, Preston Moore   Account Number:  [id number]  Date Initiated:  02/23/2012  Documentation initiated by:  Vivia Rosenburg  Subjective/Objective Assessment:   pt with recent admission and was discharge to snf on [id number], started having nausea and vomiting, abd pain abd film-sbo     Action/Plan:   [facility]   Anticipated DC Date:  02/29/2012   Anticipated DC Plan:  SKILLED NURSING FACILITY  In-house referral  Clinical Social Worker      DC Planning Services  NA      [facility] Choice  NA   Choice offered to / List presented to:  NA   DME arranged  NA      DME agency  NA     HH arranged  NA      HH agency  NA   Status of service:  In process, will continue to follow Medicare Important Message given?  NA - LOS <3 / Initial given by admissions (If response is "NO", the following Medicare IM given date fields will be blank) Date Medicare IM given:   Date Additional Medicare IM given:    Discharge Disposition:    Per UR Regulation:  Reviewed for med. necessity/level of care/duration of stay  If discussed at Long Length of Stay Meetings, dates discussed:    Comments:  [id number]/Tywone Bembenek Earlene Plater, RN, BSN, CCM No discharge needs present at time of this review at the sdu/icu level. Case Management [phone]  [id number]/NGEXBM Earlene Plater, RN, BSN, CCM No discharge needs present at time of this review at the sdu/icu level. Case Management [phone]

## 2012-02-27 ENCOUNTER — Inpatient Hospital Stay (HOSPITAL_COMMUNITY): Payer: Medicare Other

## 2012-02-27 DIAGNOSIS — D509 Iron deficiency anemia, unspecified: Secondary | ICD-10-CM

## 2012-02-27 DIAGNOSIS — E538 Deficiency of other specified B group vitamins: Secondary | ICD-10-CM

## 2012-02-27 DIAGNOSIS — K56609 Unspecified intestinal obstruction, unspecified as to partial versus complete obstruction: Principal | ICD-10-CM

## 2012-02-27 LAB — DIRECT ANTIGLOBULIN TEST (NOT AT ARMC): DAT, complement: NEGATIVE

## 2012-02-27 LAB — CBC
HCT: 19.4 % — ABNORMAL LOW (ref 39.0–52.0)
Hemoglobin: 6.7 g/dL — CL (ref 13.0–17.0)
MCH: 33.5 pg (ref 26.0–34.0)
RBC: 2 MIL/uL — ABNORMAL LOW (ref 4.22–5.81)

## 2012-02-27 LAB — BASIC METABOLIC PANEL
BUN: 13 mg/dL (ref 6–23)
Chloride: 107 mEq/L (ref 96–112)
GFR calc Af Amer: >90 mL/min (ref >90)
GFR calc non Af Amer: 80 mL/min — ABNORMAL LOW (ref >90)
Glucose, Bld: 185 mg/dL — ABNORMAL HIGH (ref 70–99)
Potassium: 3.7 mEq/L (ref 3.5–5.1)
Sodium: 136 mEq/L (ref 135–145)

## 2012-02-27 LAB — GLUCOSE, CAPILLARY
Glucose-Capillary: 194 mg/dL — ABNORMAL HIGH (ref 70–99)
Glucose-Capillary: 211 mg/dL — ABNORMAL HIGH (ref 70–99)
Glucose-Capillary: 254 mg/dL — ABNORMAL HIGH (ref 70–99)
Glucose-Capillary: 277 mg/dL — ABNORMAL HIGH (ref 70–99)

## 2012-02-27 LAB — PHOSPHORUS: Phosphorus: 1.8 mg/dL — ABNORMAL LOW (ref 2.3–4.6)

## 2012-02-27 LAB — MAGNESIUM: Magnesium: 1.4 mg/dL — ABNORMAL LOW (ref 1.5–2.5)

## 2012-02-27 MED ORDER — CYANOCOBALAMIN 1000 MCG/ML IJ SOLN
1000.0000 ug | Freq: Every day | INTRAMUSCULAR | Status: AC
  Administered 2012-02-28 – 2012-02-29 (×2): 1000 ug via INTRAMUSCULAR
  Filled 2012-02-27 (×2): qty 1

## 2012-02-27 MED ORDER — CLINIMIX E/DEXTROSE (5/15) 5 % IV SOLN
INTRAVENOUS | Status: AC
  Administered 2012-02-27: 18:00:00 via INTRAVENOUS
  Filled 2012-02-27: qty 1000

## 2012-02-27 MED ORDER — METOCLOPRAMIDE HCL 5 MG/ML IJ SOLN
5.0000 mg | Freq: Four times a day (QID) | INTRAMUSCULAR | Status: DC
  Administered 2012-02-27 – 2012-02-29 (×9): 5 mg via INTRAVENOUS
  Filled 2012-02-27: qty 2
  Filled 2012-02-27 (×7): qty 1
  Filled 2012-02-27 (×2): qty 2
  Filled 2012-02-27: qty 1
  Filled 2012-02-27 (×2): qty 2

## 2012-02-27 MED ORDER — SODIUM CHLORIDE 0.9 % IV SOLN
1.0000 g | Freq: Once | INTRAVENOUS | Status: AC
  Administered 2012-02-27: 1 g via INTRAVENOUS
  Filled 2012-02-27 (×2): qty 10

## 2012-02-27 MED ORDER — DARBEPOETIN ALFA-POLYSORBATE 150 MCG/0.3ML IJ SOLN
150.0000 ug | INTRAMUSCULAR | Status: DC
  Administered 2012-02-27: 150 ug via SUBCUTANEOUS
  Filled 2012-02-27 (×2): qty 0.3

## 2012-02-27 MED ORDER — INSULIN GLARGINE 100 UNIT/ML ~~LOC~~ SOLN
10.0000 [IU] | Freq: Every day | SUBCUTANEOUS | Status: DC
  Administered 2012-02-27 – 2012-03-01 (×4): 10 [IU] via SUBCUTANEOUS

## 2012-02-27 MED ORDER — DEXTROSE 5 % IV SOLN
20.0000 mmol | Freq: Once | INTRAVENOUS | Status: AC
  Administered 2012-02-27: 20 mmol via INTRAVENOUS
  Filled 2012-02-27 (×2): qty 6.67

## 2012-02-27 MED ORDER — MAGNESIUM SULFATE 40 MG/ML IJ SOLN
2.0000 g | Freq: Once | INTRAMUSCULAR | Status: AC
  Administered 2012-02-27: 2 g via INTRAVENOUS
  Filled 2012-02-27: qty 50

## 2012-02-27 MED ORDER — SODIUM CHLORIDE 0.9 % IV SOLN
1000.0000 mg | Freq: Once | INTRAVENOUS | Status: AC
  Administered 2012-02-27: 1000 mg via INTRAVENOUS
  Filled 2012-02-27: qty 20

## 2012-02-27 MED ORDER — POTASSIUM CHLORIDE 10 MEQ/50ML IV SOLN
10.0000 meq | INTRAVENOUS | Status: AC
  Administered 2012-02-27 (×2): 10 meq via INTRAVENOUS
  Filled 2012-02-27 (×2): qty 50

## 2012-02-27 NOTE — Progress Notes (Signed)
Subjective: Not feeling well this AM.  Had 2 BMs yesterday.  Passing gas.  Nauseated this morning.  Very weak.  Objective: Vital signs in last 24 hours: Temp:  [97.4 F (36.3 C)-98.9 F (37.2 C)] 97.4 F (36.3 C) (05/14 0400) Pulse Rate:  [80-127] 93  (05/14 0455) Resp:  [17-28] 20  (05/14 0455) BP: (105-128)/(49-66) 119/59 mmHg (05/14 0455) SpO2:  [95 %-100 %] 100 % (05/14 0455) Weight:  [227 lb 8.2 oz (103.2 kg)] 227 lb 8.2 oz (103.2 kg) (05/14 0500) Last BM Date: 02/26/12  Intake/Output from previous day: 05/13 0701 - 05/14 0700 In: 3566.7 [I.V.:1760; IV Piggyback:806.7; TPN:1000] Out: 1745 [Urine:1745] Intake/Output this shift:    PE: CV-RRR Lungs-clear Abd-soft, nontender, hypoactive bowel sounds Skin-pale Neuro-speech is weak  Lab Results:   Basename 02/27/12 0500 02/26/12 0834  WBC 4.9 5.8  HGB 6.7* 6.7*  HCT 19.4* 20.1*  PLT 209 273   BMET  Basename 02/27/12 0500 02/26/12 0515  NA 136 138  K 3.7 3.1*  CL 107 106  CO2 23 22  GLUCOSE 185* 254*  BUN 13 19  CREATININE 0.93 1.00  CALCIUM 6.5* 6.3*   PT/INR No results found for this basename: LABPROT:2,INR:2 in the last 72 hours Comprehensive Metabolic Panel:    Component Value Date/Time   NA 136 02/27/2012 0500   K 3.7 02/27/2012 0500   CL 107 02/27/2012 0500   CO2 23 02/27/2012 0500   BUN 13 02/27/2012 0500   CREATININE 0.93 02/27/2012 0500   GLUCOSE 185* 02/27/2012 0500   CALCIUM 6.5* 02/27/2012 0500   AST 21 02/26/2012 0515   ALT 17 02/26/2012 0515   ALKPHOS 63 02/26/2012 0515   BILITOT 0.3 02/26/2012 0515   PROT 4.4* 02/26/2012 0515   ALBUMIN 1.6* 02/26/2012 0515     Studies/Results: Ir Fluoro Guide Cv Line Right  02/25/2012  *RADIOLOGY REPORT*  Clinical Data: Bowel obstruction and requirement of central line to begin parenteral nutrition.  There has been inability to successfully place a central catheter at the bedside.  PICC LINE PLACEMENT WITH ULTRASOUND AND FLUOROSCOPIC  GUIDANCE  Fluoroscopy  Time: 0.1 minutes.  The right arm was prepped with chlorhexidine, draped in the usual sterile fashion using maximum barrier technique (cap and mask, sterile gown, sterile gloves, large sterile sheet, hand hygiene and cutaneous antisepsis) and infiltrated locally with 1% Lidocaine.  Ultrasound demonstrated patency of the right brachial vein, and this was documented with an image.  Under real-time ultrasound guidance, this vein was accessed with a 21 gauge micropuncture needle and image documentation was performed.  The needle was exchanged over a guidewire for a peel-away sheath through which a 6 [place] triple lumen PICC trimmed to 42 cm was advanced, positioned with its tip at the lower SVC/right atrial junction.  Fluoroscopy during the procedure and fluoro spot radiograph confirms appropriate catheter position.  The catheter was flushed, secured to the skin with Prolene sutures, and covered with a sterile dressing.  Complications:  None  IMPRESSION: Successful right arm PICC line placement with ultrasound and fluoroscopic guidance.  The catheter is ready for use.  Original Report Authenticated By: Reola Calkins, M.D.   Ir US Guide Vasc Access Right  02/25/2012  *RADIOLOGY REPORT*  Clinical Data: Bowel obstruction and requirement of central line to begin parenteral nutrition.  There has been inability to successfully place a central catheter at the bedside.  PICC LINE PLACEMENT WITH ULTRASOUND AND FLUOROSCOPIC  GUIDANCE  Fluoroscopy Time: 0.1 minutes.  The right  arm was prepped with chlorhexidine, draped in the usual sterile fashion using maximum barrier technique (cap and mask, sterile gown, sterile gloves, large sterile sheet, hand hygiene and cutaneous antisepsis) and infiltrated locally with 1% Lidocaine.  Ultrasound demonstrated patency of the right brachial vein, and this was documented with an image.  Under real-time ultrasound guidance, this vein was accessed with a 21 gauge micropuncture needle and  image documentation was performed.  The needle was exchanged over a guidewire for a peel-away sheath through which a 6 [place] triple lumen PICC trimmed to 42 cm was advanced, positioned with its tip at the lower SVC/right atrial junction.  Fluoroscopy during the procedure and fluoro spot radiograph confirms appropriate catheter position.  The catheter was flushed, secured to the skin with Prolene sutures, and covered with a sterile dressing.  Complications:  None  IMPRESSION: Successful right arm PICC line placement with ultrasound and fluoroscopic guidance.  The catheter is ready for use.  Original Report Authenticated By: Reola Calkins, M.D.   Dg Abd 2 Views  02/25/2012  *RADIOLOGY REPORT*  Clinical Data:  Small bowel obstruction  ABDOMEN - 2 VIEW  Comparison: Plain film 02/25/2012  Findings: NG tube is in the stomach.  There is no intraperitoneal free air on the lateral projection.  Loops of small bowel are slightly decreased in diameter measuring approximately 4 cm compared to 5 cm on prior.  Again demonstrated stool within the rectum with oral contrast.  IMPRESSION:  Decreased diameter of the small bowel consistent with improved small bowel obstruction.  Original Report Authenticated By: Genevive Bi, M.D.    Anti-infectives: Anti-infectives    None      Assessment Principal Problem:  *SBO (small bowel obstruction) vs ileus-favor the latter. Active Problems:  Acute renal failure-resolved  Iron deficiency anemia-hgb stable; Procrit given yesterday; no occult blood in stool  B12 deficiency  Esophageal reflux disease  PC malnutrition- on TPN  Hypokalemia  Deconditioned state with failure to thrive  DM-on SSI  Pancreatitis-5/12 lipase was normal      LOS: 5 days   Plan: Add Reglan; Check abd x-rays; add lipase level to serum in lab.  Hemotology consult.  Internal Medicine consult (family does not want [facility]).  Transfer to telemetry.  Preston Moore  J 02/27/2012

## 2012-02-27 NOTE — Consult Note (Signed)
[facility] CONSULTATION NOTE  Reason for Consult: Anemia  Referring MD: Dr. Abbey Chatters  Primary MD: Dr. Clovis Riley, Glean Hess  ZOX:WRUEA A Vu is a 78 y.o. White male with multiple medical problems including chronic anemia, recently admitted  following a hospitalization between 5/4-5/9 for acute pancreatitis and acute renal failure among other problems. At the time, his ferritin level of less then 10 and his  B12 level was 110. He had been given an IV dose of iron in addition to being discharged on 325 mg  twice daily of  ferrous sulfate . He also received IM B12 shot.  He was also placed on thiamine and folate due to his history of ETOH abuse.  H/H on 5/9 was 9.1 and 25.7. His Retic count was 58.6.   He was readmitted on 02/23/12 with continuing bilous vomiting and poor po intake. H. and H. on admission was 9.3 and 26.2 respectively with normal white count and platelets. MCV was 93.9.  Patient had a previous history of GI bleed and anemia, undergoing 2 upper endoscopies, most recent on 11/29/10 and one colonoscopy same date , with  findings remarkable for tiny duodenal erosions both in the bulb and the second portion of  the duodenum as well as four small to medium-sized sessile polyps with negative biopsies (see reports below). He was not on PPIs prior to hospitalization. He was not on ASA. CT abdomen and pelvis without contrast on 5/10 revealed bowel obstruction versus ileus.He received IV fluid ressucitation. His H/H dropped to 8.2 and 23.8 respectively. On 5/12 H/H was 7.6/22.6 respectively. WBC and platelets remain wnl.  Today, his Hb is 6.7/19. 4. MCV is 97. Patient refuses to receive transfusion, despite severe anemia, symptomatic. He is Not a [employer]. He states that he never had a transfusion before.  His FOB is negativex3. His renal insufficiency has stabilized, today wit a creatinine of 0.93. A peripheral smear has been ordered for review. We were asked to see patient with  recommendations    .      PMH: Past Medical History  Diagnosis Date  . Atrial flutter/ ICM   . Diabetes mellitus   . Neuropathy   . Hyperlipidemia   . history of Syncope and collapse   . Hypertension         Recent acute kidney injury (02/19/12)        ETOH abuse       Tobacco Abuse       Chronic Anemia- Iron Deficiency, B12 deficiency       Recent Gram positive bacteremia requiring Vanco (02/19/2012)       Encephalopathy       GERD  Surgeries: Past Surgical History  Procedure Date  . Laminectomy 1965  . Back surgery     Allergies: No Known Allergies  Medications:  Prior to Admission:  Prescriptions prior to admission  Medication Sig Dispense Refill  . CINNAMON PO Take 1 each by mouth daily.      . furosemide (LASIX) 20 MG tablet Take 20 mg by mouth daily.      Marland Kitchen gabapentin (NEURONTIN) 300 MG capsule Take 300 mg by mouth at bedtime.      Marland Kitchen HYDROcodone-acetaminophen (LORTAB) 7.5-500 MG per tablet Take 1 tablet by mouth every 6 (six) hours as needed. For pain.      Marland Kitchen insulin aspart (NOVOLOG) 100 UNIT/ML injection Inject 10 Units into the skin 3 (three) times daily before meals. If glucose over 150.      Marland Kitchen  insulin glargine (LANTUS) 100 UNIT/ML injection Inject 10 Units into the skin at bedtime.      . metFORMIN (GLUCOPHAGE) 500 MG tablet Take 1,000 mg by mouth 2 (two) times daily with a meal.      . metoprolol tartrate (LOPRESSOR) 25 MG tablet Take 25 mg by mouth 2 (two) times daily.      . simvastatin (ZOCOR) 20 MG tablet Take 20 mg by mouth at bedtime.      . trazodone (DESYREL) 300 MG tablet Take 300 mg by mouth at bedtime.      . cyanocobalamin (,VITAMIN B-12,) 1000 MCG/ML injection Inject 1 mL (1,000 mcg total) into the skin daily.  1 mL  5  . ferrous sulfate 325 (65 FE) MG tablet Take 1 tablet (325 mg total) by mouth 2 (two) times daily with a meal.  60 tablet  0  . folic acid (FOLVITE) 1 MG tablet Take 1 tablet (1 mg total) by mouth daily.      . Multiple Vitamin  (MULITIVITAMIN WITH MINERALS) TABS Take 1 tablet by mouth daily.      Marland Kitchen oxyCODONE (OXY IR/ROXICODONE) 5 MG immediate release tablet Take 1 tablet (5 mg total) by mouth every 4 (four) hours as needed.  30 tablet  0  . pantoprazole (PROTONIX) 40 MG tablet Take 1 tablet (40 mg total) by mouth 2 (two) times daily before a meal.      . sucralfate (CARAFATE) 1 GM/10ML suspension Take 10 mLs (1 g total) by mouth 4 (four) times daily -  with meals and at bedtime.  420 mL    . thiamine 100 MG tablet Take 1 tablet (100 mg total) by mouth daily.        ZOX:WRUEAVWU injection, ondansetron, sodium chloride  ROS: Constitutional: denies weight loss. Negative for fever, chills and positive for malaise/fatigue.  Eyes: Negative for blurred vision and double vision.  Respiratory: Negative for cough, hemoptysis and  Positive for shortness of breath and DOE.  Cardiovascular: Negative for chest pain. GI: Positive for nausea, vomiting, diarrhea, constipation. No change in bowel caliber. No  Melena or hematochezia.  GU: No blood in urine. No loss of urinary control. Skin: Negative for itching. No rash. No petechia. No bruising Neurological: Lethargic and MS changes since prior admission, especially trying to find proper words, which provokes frustration. No motor or sensory deficits.  Family History:  Family History  Problem Relation Age of Onset  . Hypertension Neg Hx   . Coronary artery disease Neg Hx   . Diabetes Neg Hx     Social History:  reports that he has been smoking Cigarettes.  He smokes since age 77. He has never used smokeless tobacco. He reports that he drinks about 21.6 ounces of alcohol per week. He reports that he does not use illicit drugs. Has 4 daughters and one son. Patient's daughter and power of attorney [name] [phone]. .  Widower. 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     Physical Exam  78 year old wm  in no acute distress, chronically  ill appearing. Currently nauseous and weak. General well-developed and well-nourished  HEENT: Normocephalic, atraumatic, PERRLA. Oral cavity without thrush or lesions, but dry. Neck supple. no thyromegaly, no cervical or supraclavicular adenopathy  Lungs  No wheezing, rhonchi , trace of  Rales anteriorily. No axillary masses. Breasts: not examined. Cardiac regular rate and rhythm normal S1-S2, no murmur , rubs or gallops Abdomen  Obese, soft nontender ,  decreased  bowel sounds x4. No HSM GU/rectal: deferred. Extremities no clubbing cyanosis, bilateral trace edema. presence of  bruising at venipunctures site, but no  petechial rash. Skin pale.  Neuro: follows commands. Moves all extremities. Trouble finding words    Labs:  CBC   Lab 02/27/12 0500 02/26/12 0834 02/26/12 0515 02/25/12 0320 02/24/12 0335 02/22/12 2351  WBC 4.9 5.8 5.1 6.3 5.9 --  HGB 6.7* 6.7* 6.7* 7.6* 8.2* --  HCT 19.4* 20.1* 19.8* 22.6* 23.8* --  PLT 209 273 231 314 297 --  MCV 97.0 100.5* 97.5 97.4 96.4 --  MCH 33.5 33.5 33.0 32.8 33.2 --  MCHC 34.5 33.3 33.8 33.6 34.5 --  RDW 14.0 14.3 14.1 14.1 13.9 --  LYMPHSABS -- 1.4 1.2 1.4 -- 1.4  MONOABS -- 0.3 0.3 0.3 -- 0.7  EOSABS -- 0.0 0.0 0.1 -- 0.0  BASOSABS -- 0.0 0.0 0.0 -- 0.0  BANDABS -- -- -- -- -- --      CMP    Lab 02/27/12 0500 02/26/12 0515 02/25/12 0320 02/24/12 0335 02/22/12 2351 02/21/12 0459  NA 136 138 142 140 131* --  K 3.7 3.1* 3.4* 3.7 3.9 --  CL 107 106 107 103 96 --  CO2 23 22 23 26 26  --  GLUCOSE 185* 254* 132* 161* 285* --  BUN 13 19 29* 35* 40* --  CREATININE 0.93 1.00 1.17 1.44* 1.67* --  CALCIUM 6.5* 6.3* 6.0* 6.6* 6.9* --  MG 1.4* 1.3* -- 1.4* -- --  AST -- 21 -- 28 21 25   ALT -- 17 -- 18 16 14   ALKPHOS -- 63 -- 68 73 72  BILITOT -- 0.3 -- 0.4 0.5 0.4        Component Value Date/Time   BILITOT 0.3 02/26/2012 0515   BILIDIR <0.1 02/19/2012 1324   IBILI NOT CALCULATED 02/19/2012 1324      No results found for this  basename: INR:5,PROTIME:5 in the last 168 hours  No results found for this basename: DDIMER:2 in the last 72 hours    Imaging Studies:   Dg Abd 2 Views  02/25/2012  *RADIOLOGY REPORT*  Clinical Data:  Small bowel obstruction  ABDOMEN - 2 VIEW  Comparison: Plain film 02/25/2012  Findings: NG tube is in the stomach.  There is no intraperitoneal free air on the lateral projection.  Loops of small bowel are slightly decreased in diameter measuring approximately 4 cm compared to 5 cm on prior.  Again demonstrated stool within the rectum with oral contrast.  IMPRESSION:  Decreased diameter of the small bowel consistent with improved small bowel obstruction.  Original Report Authenticated By: Genevive Bi, M.D.   02/23/2012 *RADIOLOGY REPORT* Clinical Data: Distended abdomen, nausea, vomiting and constipation. CT ABDOMEN AND PELVIS WITHOUT CONTRAST Technique: Multidetector CT imaging of the abdomen and pelvis was performed following the standard protocol without intravenous contrast. Comparison: CT of the abdomen and pelvis performed 05/02/2011, and renal ultrasound performed 02/18/2012 Findings: A small right pleural effusion is seen. Left basilar airspace opacification raises concern for pneumonia. Diffuse coronary artery calcifications are seen. The liver and spleen are unremarkable in appearance. The gallbladder is within normal limits. The pancreas and adrenal glands are unremarkable. The kidneys are unremarkable in appearance. There is no evidence of hydronephrosis. No renal or ureteral stones are seen. No perinephric stranding is appreciated. A small amount of free fluid is noted tracking along the right paracolic gutter. There is mild diffuse distension of small bowel loops, to 4.2 cm  in maximal diameter. There is gradual dilution of contrast within the small bowel, with gradual decompression of the small bowel to the level of the terminal ileum. Mild surrounding soft tissue stranding is noted, and  fluid is seen tracking along small bowel loops. This could reflect partial small bowel obstruction, or small bowel dysmotility; no definite focal transition point is characterized. The stomach is within normal limits. An enteric tube is noted ending at the antrum of the stomach. No acute vascular abnormalities are seen. Scattered calcification is noted along the abdominal aorta and its branches. This is particularly prominent along the proximal superior mesenteric artery and right renal artery. The appendix is normal in caliber, without evidence for appendicitis. Residual contrast is noted within the colon; the colon is largely decompressed. Asymmetric soft tissue edema is noted along the right lateral abdominal wall. The bladder is decompressed, with a Foley catheter in place; scattered air within the bladder reflects Foley catheter placement. The prostate is mildly enlarged, measuring 4.9 cm in transverse dimension. A small right inguinal hernia is noted, containing only fat. No inguinal lymphadenopathy is seen. No acute osseous abnormalities are identified. Vacuum phenomenon and disc space narrowing are noted at L4-L5.  IMPRESSION: 1. Diffuse distension of small bowel loops, to 4.2 cm in maximal diameter. There is gradual decompression of the small bowel to the level of the terminal ileum, with mild surrounding soft tissue stranding and fluid tracking about small bowel loops. This could reflect partial small bowel obstruction, or small bowel dysmotility; no definite focal transition point is seen to suggest high-grade obstruction. 2. Decompression of the colon suggests bowel obstruction. 3. Left basilar airspace opacification raises concern for pneumonia. 4. Small amount of free fluid noted at the right side of the abdomen. 5. Asymmetric soft tissue edema noted along the right lateral abdominal wall. 6. Scattered calcification along the abdominal aorta and its branches, particularly prominent along the proximal  superior mesenteric artery and right renal artery. 7. Small right pleural effusion seen. 8. Diffuse coronary artery calcification noted. 9. Small right inguinal hernia, containing only fat. 10. Mildly enlarged prostate. Original Report Authenticated By: Tonia Ghent, M.D.     CT HEAD WITHOUT CONTRAST  Comparison: None.  Findings: Slightly prominent fourth ventricle noted, without apparent underlying mass lesion or asymmetry. This is thought to  be incidental. The cerebellum appears unremarkable. The cerebral peduncles and thalami appear normal. The basal ganglia likewise appear unremarkable. The lateral ventricles appear within normal limits. No intracranial hemorrhage, mass lesion, or acute CVA is observed.  IMPRESSION:  1. No significant intracranial abnormality is observed to explain the patient's altered mental status.   s/p Esophagogastroduodenoscopy with biopsy, Dr. Ewing Schlein, 11/29/2010 .      INDICATION:  The patient with history of esophageal ulcers, duodenal  ulcers and GI bleeding.         ENDOSCOPIC DIAGNOSES:   1. Small hiatal hernia with mild-to-moderate distal esophagitis continuing.   2. Mild antritis with some proximal gastritis which looked more portal gastropathy status post biopsy.   3. Tiny duodenal erosions both in the bulb and the second portion of  the duodenum.   4. Otherwise normal esophagogastroduodenoscopy.         S/p Colonoscopy with polypectomy    Dr. Ewing Schlein, 11/29/2010    INDICATIONS:  Patient with anemia, guaiac positivity, and GI bleeding;  overdue for colonic screening    ENDOSCOPIC DIAGNOSES:  1. Internal/external hemorrhoids.  2. Four small to medium-sized sessile polyps in the proximal  descending, distal mid, and proximal transverse.  two having hot biopsies, negative for malignancy. 3. Rare early left-sided diverticula.  4. Two cecal nonbleeding small arteriovenous malformations.   5. Otherwise within normal limits to the terminal ileum.         A/P: 78 y.o. male asked to see for evaluation of Anemia, multifactorial, including Iron and B12 deficiency, recent UTI, history of GIB,  polypharmacy, dilution, por po intake  and polyphlebotomies. Patient refuses transfusion. Will review the smear for abnormalities. Consider check Epo levels Try to minimize blood drawings, if necessary, consider pediatric tubes.  Dr.Winola Drum  is to see the patient following this consult with recommendations regarding diagnosis, treatment options and further workup studies.  Thank you for the referral.  [facility] 02/27/2012 10:15 AM    I have seen and examined the patient and agree with above.  SUMMARY  78 yr old man with:- 1.  Known history of GI bleeding with duodenal erosions, pancreatitis with ETOH abuse. 2.  History of Iron deficiency anemia with anemia of chronic disease.  Ferritin is presently 359 but iron and saturation remain low at  remains low at 10 and 7% respectively. IV iron supplementation will be beneficial.   He remains adverse to receiving blood transfusion but is agreeable to IV Iron.  I also agree with aranesp in setting of renal failure.  Limit blood draw but would add haptoglobin, coombs and LDH to labs. 2. Vitamin B12 deficiency - Replace vitamin B12 daily X 3 doses and then every month thereafter.     Laurice Record., M.D.

## 2012-02-27 NOTE — Progress Notes (Signed)
OT Cancellation Note  Treatment cancelled today due to patient's refusal to participate. Pt stated he was very tired this afternoon.  Pt eyes closing while OT explaining role of OT to patient.  Will re attempt next day.   Alba Cory 02/27/2012, 2:08 PM

## 2012-02-27 NOTE — Progress Notes (Signed)
Inpatient Diabetes Program Recommendations  AACE/ADA: New Consensus Statement on Inpatient Glycemic Control (2009)  Target Ranges:  Prepandial:   less than 140 mg/dL      Peak postprandial:   less than 180 mg/dL (1-2 hours)      Critically ill patients:  140 - 180 mg/dL   Reason for Visit: CBG's greater than 200 mg/dL.  Note Lantus 10 units added today (this was home dose).  If CBG's remain greater than 180 mg/dL, may consider adding insulin to TPN.  Will follow.

## 2012-02-27 NOTE — Progress Notes (Signed)
PARENTERAL NUTRITION CONSULT NOTE - FOLLOW UP  Pharmacy Consult for TNA Indication: SBO vs Ileus  No Known Allergies  Patient Measurements: Height: 5\' 9"  (175.3 cm) Weight: 227 lb 8.2 oz (103.2 kg) IBW/kg (Calculated) : 70.7  Adjusted Body Weight: 84  Vital Signs: Temp: 97.4 F (36.3 C) (05/14 0400) Temp src: Oral (05/14 0400) BP: 119/59 mmHg (05/14 0455) Pulse Rate: 93  (05/14 0455) Intake/Output from previous day: 05/13 0701 - 05/14 0700 In: 3566.7 [I.V.:1760; IV Piggyback:806.7; TPN:1000] Out: 1745 [Urine:1745] Intake/Output from this shift:    Labs:  [facility] 02/27/12 0500 02/26/12 0834 02/26/12 0515  WBC 4.9 5.8 5.1  HGB 6.7* 6.7* 6.7*  HCT 19.4* 20.1* 19.8*  PLT 209 273 231  APTT -- -- --  INR -- -- --     Basename 02/27/12 0500 02/26/12 0515 02/25/12 0320  NA 136 138 142  K 3.7 3.1* 3.4*  CL 107 106 107  CO2 23 22 23   GLUCOSE 185* 254* 132*  BUN 13 19 29*  CREATININE 0.93 1.00 1.17  LABCREA -- -- --  CREAT24HRUR -- -- --  CALCIUM 6.5* 6.3* 6.0*  MG 1.4* 1.3* --  PHOS 1.8* 1.3* --  PROT -- 4.4* --  ALBUMIN -- 1.6* --  AST -- 21 --  ALT -- 17 --  ALKPHOS -- 63 --  BILITOT -- 0.3 --  BILIDIR -- -- --  IBILI -- -- --  PREALBUMIN -- 5.0* --  TRIG -- 85 --  CHOLHDL -- -- --  CHOL -- 56 --   Estimated Creatinine Clearance: 81.3 ml/min (by C-G formula based on Cr of 0.93).  Corrected Ca 8.2  Basename 02/27/12 0826 02/27/12 0301 02/26/12 2347  GLUCAP 204* 200* 189*     Medications:  Scheduled:     . calcium gluconate  1 g Intravenous Once  . cyanocobalamin  1,000 mcg Intramuscular Daily  . darbepoetin  25 mcg Subcutaneous Q7 days  . folic acid  1 mg Intravenous Daily  . insulin aspart  0-15 Units Subcutaneous Q4H  . magnesium sulfate 1 - 4 g bolus IVPB  2 g Intravenous Once  . metoCLOPramide (REGLAN) injection  5 mg Intravenous Q6H  . metoprolol  5 mg Intravenous Q6H  . pantoprazole (PROTONIX) IV  40 mg Intravenous Q12H  . potassium  chloride  10 mEq Intravenous Q1 Hr x 5  . potassium phosphate IVPB (mmol)  20 mmol Intravenous Once  . sodium chloride  10-40 mL Intracatheter Q12H  . thiamine  100 mg Intravenous Daily  . DISCONTD: insulin aspart  0-9 Units Subcutaneous Q4H   Infusions:     . 0.45 % NaCl with KCl 20 mEq / L 80 mL/hr at 02/27/12 0745  . sodium chloride Stopped (02/25/12 1555)  . TPN (CLINIMIX) +/- additives 40 mL/hr at 02/26/12 1744   And  . fat emulsion 240 mL (02/26/12 1747)  . TPN (CLINIMIX) +/- additives 40 mL/hr at 02/25/12 1746   PRN: morphine injection, ondansetron, sodium chloride  Insulin Requirements in the past 24 hours:  32 units SSI used in past 24 hours  Current Nutrition:  Clinimix E 5/15% @ 35ml/hr CL diet started this AM; patient pulled NG MIVF 0.45% NS + KCL 20 mEq/L @ 19ml/hr  Assessment:  43 YOM admitted 5/9 who was recently here for pancreatitis (5/4-9) and then d/c to [facility] home. Readmitted within 24 hours of that discharge with vomiting, no BM.  Pt was started on TNA 5/12 PM for suspected SBO  vs ileus, and PCM (Pt w/hx 0.5 gal hard ETOH PTA).  Scr was elevated but is now wnl (Scr 0.93 today < 2.68 on 5/7)  K is improved  today after IV supplement yesterday - will give small supplment  Mg, Phos and Ca low - will supp  CBG elevated  Lipids wnl  Preablumin low at 5.0 (5/13)  Reglan 5 mg Q6h started today; IV protonix  Nutritional Goals:  Clinimix E 5/15% @ 77ml/hr + Lipid 61ml/hr MWF to provide and average of 1739kcal/d and 108g protein Rec tpn goal of 95 ml/hr to provide and average of 1825 kcal, 114 g protein  Plan:   Continue Clinimix E 5/15% @ 42ml/hr. Continue MIVF @ 51ml/hr. Will not advance rate until CBG and e-lytes have stabilized  Will repeat CaGlc  IV 1g x 1, Magnesium Sulfate IV 2g x 1 and Potassium Phosphate 20 mmol IV x 1, and give Potassium 20 mEq IV today  Continue SSI to moderate scale. Start Lantus 10 units qhs.  Follow up am BMET, Mg  and Phos  MVI/TE and Lipids MWF only d/t ongoing shortages.  Clydene Fake PharmD 9:06 AM 02/27/2012

## 2012-02-27 NOTE — Progress Notes (Signed)
eLink Physician-Brief Progress Note Patient Name: Preston Moore DOB: May 03, 1934 MRN: [id number]  Date of Service  02/27/2012   HPI/Events of Note   Lab 02/27/12 0500 02/26/12 0834 02/26/12 0515 02/25/12 0320 02/24/12 0335  HGB 6.7* 6.7* 6.7* 7.6* 8.2*    No results found for this basename: TROPONINI:5 in the last 168 hours  Lab 02/27/12 0500 02/26/12 0515 02/25/12 0320 02/24/12 0335 02/22/12 2351  CREATININE 0.93 1.00 1.17 1.44* 1.67*       eICU Interventions  Called RN to decide on prbc but RN says he has consistently refused PRBC. Basis uknonwn  Recommend  - a. Understand psychological basis to refuse prbc B. If refuses, approach with blood conservation strategy of cutting down phlebotomies and minimizing tests   Intervention Category Intermediate Interventions: Diagnostic test evaluation  Galileah Piggee 02/27/2012, 6:47 AM

## 2012-02-28 LAB — GLUCOSE, CAPILLARY
Glucose-Capillary: 217 mg/dL — ABNORMAL HIGH (ref 70–99)
Glucose-Capillary: 239 mg/dL — ABNORMAL HIGH (ref 70–99)

## 2012-02-28 LAB — BASIC METABOLIC PANEL
Chloride: 105 mEq/L (ref 96–112)
GFR calc non Af Amer: 83 mL/min — ABNORMAL LOW (ref >90)
Glucose, Bld: 224 mg/dL — ABNORMAL HIGH (ref 70–99)
Potassium: 4.7 mEq/L (ref 3.5–5.1)
Sodium: 133 mEq/L — ABNORMAL LOW (ref 135–145)

## 2012-02-28 LAB — HAPTOGLOBIN: Haptoglobin: 237 mg/dL — ABNORMAL HIGH (ref 45–215)

## 2012-02-28 LAB — PHOSPHORUS: Phosphorus: 2.7 mg/dL (ref 2.3–4.6)

## 2012-02-28 MED ORDER — POTASSIUM CHLORIDE IN NACL 20-0.9 MEQ/L-% IV SOLN
INTRAVENOUS | Status: DC
  Administered 2012-02-29: 50 mL/h via INTRAVENOUS
  Filled 2012-02-28 (×2): qty 1000

## 2012-02-28 MED ORDER — FAT EMULSION 20 % IV EMUL
240.0000 mL | INTRAVENOUS | Status: AC
  Administered 2012-02-28: 240 mL via INTRAVENOUS
  Filled 2012-02-28: qty 250

## 2012-02-28 MED ORDER — TRACE MINERALS CR-CU-MN-SE-ZN 10-1000-500-60 MCG/ML IV SOLN
INTRAVENOUS | Status: AC
  Administered 2012-02-28: 18:00:00 via INTRAVENOUS
  Filled 2012-02-28: qty 2000

## 2012-02-28 MED ORDER — MAGNESIUM SULFATE 40 MG/ML IJ SOLN
2.0000 g | Freq: Once | INTRAMUSCULAR | Status: AC
  Administered 2012-02-28: 2 g via INTRAVENOUS
  Filled 2012-02-28: qty 50

## 2012-02-28 MED ORDER — FUROSEMIDE 10 MG/ML IJ SOLN
20.0000 mg | Freq: Every day | INTRAMUSCULAR | Status: DC
Start: 2012-02-28 — End: 2012-02-29
  Administered 2012-02-28 – 2012-02-29 (×2): 20 mg via INTRAVENOUS
  Filled 2012-02-28 (×2): qty 2

## 2012-02-28 MED ORDER — ACETAMINOPHEN 325 MG PO TABS
650.0000 mg | ORAL_TABLET | ORAL | Status: DC | PRN
  Administered 2012-02-28 (×2): 650 mg via ORAL
  Filled 2012-02-28 (×2): qty 2

## 2012-02-28 MED ORDER — POTASSIUM CHLORIDE IN NACL 20-0.9 MEQ/L-% IV SOLN
INTRAVENOUS | Status: AC
  Administered 2012-02-28: 13:00:00 via INTRAVENOUS
  Filled 2012-02-28: qty 1000

## 2012-02-28 MED ORDER — BISACODYL 10 MG RE SUPP: 10.0000 mg | Freq: Every day | RECTAL | Status: DC | PRN

## 2012-02-28 NOTE — Evaluation (Signed)
Occupational Therapy Evaluation Patient Details Name: Preston Moore MRN: [id number] DOB: June 23, 1934 Today's Date: 02/28/2012 Time: 0947-1000 OT Time Calculation (min): 13 min  OT Assessment / Plan / Recommendation Clinical Impression  77 yo wm who was hospitalized last week with vomiting and diarrhea. He was probably dehydrated and had renal insufficiency. He may have also had pancreatitis which contributed to this. He gradually improved and was discharged earlier today but family says he was still vomiting earlier before he left the hospital and had not eaten anything or had a bm in days. He returns tonight after an episode of bilious vomiting. Pt displays increased pain, decreased strength and overall decreased independence wth ADL. Will benefit from skilled OT services to increase his independence with these tasks.    OT Assessment  Patient needs continued OT Services    Follow Up Recommendations  Skilled nursing facility    Barriers to Discharge      Equipment Recommendations  Defer to next venue    Recommendations for Other Services    Frequency  Min 1X/week    Precautions / Restrictions Precautions Precautions: Fall        ADL  Eating/Feeding: Not assessed Grooming: Simulated;Wash/dry hands;Set up Where Assessed - Grooming: Supported sitting Upper Body Bathing: Simulated;Chest;Right arm;Left arm;Abdomen;Minimal assistance Where Assessed - Upper Body Bathing: Sitting, chair;Supported Lower Body Bathing: Simulated;+1 Total assistance;Other (comment) (from chair. pt only able to reach just past knees then pain) Where Assessed - Lower Body Bathing: Sitting, chair Upper Body Dressing: Simulated;Minimal assistance Where Assessed - Upper Body Dressing: Sitting, chair;Supported Lower Body Dressing: +1 Total assistance;Other (comment) (socks) Where Assessed - Lower Body Dressing: Sitting, chair;Unsupported Toilet Transfer: Not assessed Toilet Transfer Method: Not  assessed Toileting - Clothing Manipulation: Not assessed Toileting - Hygiene: Not assessed Tub/Shower Transfer: Not assessed Tub/Shower Transfer Method: Not assessed ADL Comments: Pt limited by pain this session. 9/10 in abdomen. Nsg states pt not able to have anything at current time for pain. Pt tried to sit unsupported at [facility] and reach down to feet to doff sock but unable due to abdomen pain. Instructed in AE options for LB ADL but pt didnt feel up to trying it out this visit. Demonstrated all AE for pt . Encouraged pt to sit up in chair alittle longer to help get stronger as he had just gotten into chair wiht PT assisting. Pt states "I dont care much about strength right now." but did agree to sit up alittle longer.     OT Diagnosis: Generalized weakness;Acute pain  OT Problem List: Decreased strength;Pain;Decreased knowledge of use of DME or AE;Decreased activity tolerance OT Treatment Interventions: Self-care/ADL training;Therapeutic activities;DME and/or AE instruction;Patient/family education;Therapeutic exercise   OT Goals Acute Rehab OT Goals OT Goal Formulation: With patient Time For Goal Achievement: 03/13/12 Potential to Achieve Goals: Good ADL Goals Pt Will Perform Grooming: Sitting, edge of bed;Sitting, chair;with set-up ADL Goal: Grooming - Progress: Goal set today Pt Will Perform Upper Body Bathing: with set-up;Sitting, edge of bed;Sitting, chair;Unsupported ADL Goal: Upper Body Bathing - Progress: Goal set today Pt Will Perform Lower Body Bathing: with max assist;Sit to stand from chair;Sit to stand from bed;with adaptive equipment ADL Goal: Lower Body Bathing - Progress: Goal set today Pt Will Perform Upper Body Dressing: with set-up;Sitting, chair;Sitting, bed;Unsupported ADL Goal: Upper Body Dressing - Progress: Goal set today Pt Will Perform Lower Body Dressing: with max assist;with adaptive equipment;Sit to stand from chair;Sit to stand from bed ADL Goal: Lower Body  Dressing -  Progress: Goal set today Pt Will Transfer to Toilet: with mod assist;3-in-1;Stand pivot transfer;with DME ADL Goal: Toilet Transfer - Progress: Goal set today Arm Goals Pt Will Complete Theraband Exer: with supervision, verbal cues required/provided;Bilateral upper extremities;1 set;10 reps;Level 1 Theraband  Visit Information  Last OT Received On: 02/28/12 Assistance Needed: +2 (for OOB per PT)    Subjective Data  Subjective: I dont care too much about strength right now. Patient Stated Goal: to decrease pain   Prior Functioning  Home Living Available Help at Discharge: Skilled Nursing Facility (had been DC from Hospital then came back w/in hours) Prior Function Level of Independence: Needs assistance (at home, pt reports independence. assist at SNF)    Cognition  Overall Cognitive Status: Appears within functional limits for tasks assessed/performed Arousal/Alertness: Awake/alert Behavior During Session: [facility] for tasks performed    Extremity/Trunk Assessment Right Upper Extremity Assessment RUE ROM/Strength/Tone: Deficits RUE ROM/Strength/Tone Deficits: shoulder flexion 4-/5 elbow distal WFL Left Upper Extremity Assessment LUE ROM/Strength/Tone: Deficits LUE ROM/Strength/Tone Deficits: shoulder flexion 4-/5 elbow distal WFL   Mobility     Exercise    Balance    End of Session OT - End of Session Equipment Utilized During Treatment: Other (comment) (AE) Activity Tolerance: Patient limited by pain Patient left: in chair;with call bell/phone within reach   Lennox Laity [phone] 02/28/2012, 11:17 AM

## 2012-02-28 NOTE — Progress Notes (Signed)
Physical Therapy Treatment Patient Details Name: Preston Moore MRN: [id number] DOB: Nov 15, 1933 Today's Date: 02/28/2012 Time: 8469-6295 PT Time Calculation (min): 25 min  PT Assessment / Plan / Recommendation Comments on Treatment Session  Pt still very deconditioned. c/o some abdominal pain. Will need SNF for rehab.     Follow Up Recommendations  Skilled nursing facility    Barriers to Discharge        Equipment Recommendations  Defer to next venue    Recommendations for Other Services    Frequency Min 3X/week   Plan Discharge plan remains appropriate    Precautions / Restrictions Precautions Precautions: Fall Restrictions Weight Bearing Restrictions: No   Pertinent Vitals/Pain     Mobility  Bed Mobility Bed Mobility: Supine to Sit Supine to Sit: 1: +2 Total assist Supine to Sit: Patient Percentage: 50% Sitting - Scoot to Edge of Bed: 2: Max assist Details for Bed Mobility Assistance: Multimodal cues for technique, hand placement. Assist for bil LEs off EOB and trunk to upright. Increased time. Utilized bedpad for scooting, positioning Transfers Transfers: Sit to Stand;Stand to Sit Sit to Stand: 1: +2 Total assist;From elevated surface;With upper extremity assist;From bed Sit to Stand: Patient Percentage: 60% Stand to Sit: To chair/3-in-1;1: +2 Total assist;With upper extremity assist;With armrests Stand to Sit: Patient Percentage: 60% Stand Pivot Transfers: 1: +2 Total assist Stand Pivot Transfers: Patient Percentage: 60% Details for Transfer Assistance: Fatigues easily/quickly. VCs safety, posture. Assist to weightshift and support pt in standing. Continues to sit abruptly.  Ambulation/Gait Ambulation/Gait Assistance: 1: +2 Total assist Ambulation/Gait: Patient Percentage: 60% Ambulation Distance (Feet): 3 Feet Assistive device: Rolling walker Ambulation/Gait Assistance Details: 3 very small shuffle steps forward. More difficulty stepping with R LE. Assist to  support pt and weightshift to encourge step with R foot.  Gait Pattern: Shuffle    Exercises General Exercises - Lower Extremity Ankle Circles/Pumps: AROM;Both;Seated;10 reps Long Arc Quad: AROM;20 reps;Seated Hip ABduction/ADduction: AAROM;Both;10 reps;Seated   PT Diagnosis:    PT Problem List:   PT Treatment Interventions:     PT Goals Acute Rehab PT Goals PT Goal: Supine/Side to Sit - Progress: Progressing toward goal PT Goal: Sit to Stand - Progress: Progressing toward goal PT Goal: Stand to Sit - Progress: Progressing toward goal PT Transfer Goal: Bed to Chair/Chair to Bed - Progress: Progressing toward goal PT Goal: Ambulate - Progress: Progressing toward goal PT Goal: Perform Home Exercise Program - Progress: Progressing toward goal  Visit Information  Last PT Received On: 02/28/12 Assistance Needed: +2    Subjective Data  Subjective: "You got me" Patient Stated Goal: Get better.    Cognition  Overall Cognitive Status: Impaired Area of Impairment: Problem solving;Executive functioning;Following commands Arousal/Alertness: Lethargic Orientation Level: Disoriented to;Place;Time;Situation Behavior During Session: Lethargic Current Attention Level: Selective Following Commands: Follows one step commands with increased time Safety/Judgement: Decreased awareness of need for assistance;Decreased safety judgement for tasks assessed    Balance     End of Session PT - End of Session Equipment Utilized During Treatment: Gait belt Activity Tolerance: Patient limited by fatigue Patient left: in chair;with call bell/phone within reach    Rebeca Alert [facility] 02/28/2012, 11:44 AM [phone]

## 2012-02-28 NOTE — Progress Notes (Signed)
PARENTERAL NUTRITION CONSULT NOTE - FOLLOW UP  Pharmacy Consult for TNA Indication: SBO vs Ileus  No Known Allergies  Patient Measurements: Height: 5\' 9"  (175.3 cm) Weight: 227 lb 8.2 oz (103.2 kg) IBW/kg (Calculated) : 70.7  Adjusted Body Weight: 84  Vital Signs: Temp: 98.2 F (36.8 C) (05/15 0448) Temp src: Oral (05/15 0448) BP: 123/72 mmHg (05/15 0448) Pulse Rate: 64  (05/15 0448) Intake/Output from previous day: 05/14 0701 - 05/15 0700 In: 1060 [I.V.:560; IV Piggyback:150; TPN:350] Out: 1350 [Urine:1350] Labs:  [facility] 02/27/12 0500 02/26/12 0834 02/26/12 0515  WBC 4.9 5.8 5.1  HGB 6.7* 6.7* 6.7*  HCT 19.4* 20.1* 19.8*  PLT 209 273 231  APTT -- -- --  INR -- -- --     Basename 02/28/12 1045 02/27/12 0500 02/26/12 0515  NA 133* 136 138  K 4.7 3.7 3.1*  CL 105 107 106  CO2 21 23 22   GLUCOSE 224* 185* 254*  BUN 10 13 19   CREATININE 0.86 0.93 1.00  LABCREA -- -- --  CREAT24HRUR -- -- --  CALCIUM 6.9* 6.5* 6.3*  MG 1.4* 1.4* 1.3*  PHOS 2.7 1.8* 1.3*  PROT -- -- 4.4*  ALBUMIN -- -- 1.6*  AST -- -- 21  ALT -- -- 17  ALKPHOS -- -- 63  BILITOT -- -- 0.3  BILIDIR -- -- --  IBILI -- -- --  PREALBUMIN -- -- 5.0*  TRIG -- -- 85  CHOLHDL -- -- --  CHOL -- -- 56   Estimated Creatinine Clearance: 81.3 ml/min (by C-G formula based on Cr of 0.93).  Corrected Ca 8.2  Basename 02/28/12 0758 02/28/12 0337 02/27/12 2349  GLUCAP 224* 205* 194*    Medications:  Scheduled:     . calcium gluconate  1 g Intravenous Once  . cyanocobalamin  1,000 mcg Intramuscular Daily  . cyanocobalamin  1,000 mcg Intramuscular Daily  . darbepoetin (ARANESP) injection - NON-DIALYSIS  150 mcg Subcutaneous Q Tue-1800  . folic acid  1 mg Intravenous Daily  . insulin aspart  0-15 Units Subcutaneous Q4H  . insulin glargine  10 Units Subcutaneous Daily  . iron dextran (INFED/DEXFERRUM) infusion  1,000 mg Intravenous Once  . magnesium sulfate 1 - 4 g bolus IVPB  2 g Intravenous Once    . metoCLOPramide (REGLAN) injection  5 mg Intravenous Q6H  . metoprolol  5 mg Intravenous Q6H  . pantoprazole (PROTONIX) IV  40 mg Intravenous Q12H  . potassium chloride  10 mEq Intravenous Q1 Hr x 2  . potassium phosphate IVPB (mmol)  20 mmol Intravenous Once  . sodium chloride  10-40 mL Intracatheter Q12H  . thiamine  100 mg Intravenous Daily  . DISCONTD: darbepoetin  25 mcg Subcutaneous Q7 days   Infusions:     . 0.45 % NaCl with KCl 20 mEq / L 80 mL/hr at 02/27/12 2201  . sodium chloride Stopped (02/25/12 1555)  . TPN (CLINIMIX) +/- additives 40 mL/hr at 02/26/12 1744   And  . fat emulsion 240 mL (02/26/12 1747)  . TPN (CLINIMIX) +/- additives 40 mL/hr at 02/27/12 1812   PRN: morphine injection, ondansetron, sodium chloride   Nutritional Goals:   Per RD recs (5/12):  Kcal: 1800-2000, Protein: 105-115 g/day, Fluid: >1.8L/day  Clinimix E 5/15 @ 43ml/hr + Lipid 20% at 47ml/hr MWF to provide and average of 1836 kcal/d and 114g protein  Current nutrition:   Clinimix E 5/15 @ 60ml/hr  CL diet (started 5/13)  CBGs & Insulin requirements  past 24 hours:  CBGs: 204, 211, 254, 277, 194, 205, 224 Lantus insulin: 10mg  started 5/14 Moderate SSI: 34 units  IVF: 0.45% NS + KCL 20 mEq/L @ 7ml/hr, NS @KVO   Labs:   Scr was elevated but is now wnl  Electrolytes: Na slightly low (unable to adjust in premixed TNA), Mag remains low.   K, Phos and Corrected Ca improved to wnl  S/p K, Mag, Phos, and Ca supplements 5/14  LFTs  wnl (5/13)  Chol/TG wnl (5/13)  Preablumin low at 5.0 (5/13)  Assessment:   77 YOM admitted 5/9 who was recently here for pancreatitis (5/4-9) and then d/c to [facility] home. Readmitted within 24 hours of that discharge with vomiting, no BM.  Pt was started on TNA 5/12 PM for suspected SBO vs ileus, and PCM (Pt w/hx 0.5 gal hard ETOH weekly PTA).  CBG elevated (see above)  Additional Rx:  IV iron dextran (5/14); Aranesp (5/14), B12 injection, folic  acid, thiamine, Reglan 5 mg Q6h, IV protonix  Plan:  Increase Clinimix E 5/15 to 70 ml/hr  Fat emulsion at 10 ml/hr (MWF only due to ongoing shortage).  Plan to advance as tolerated to a goal rate of 95 ml/hr as tolerated and electrolytes normalize  TNA to contain standard multivitamins and trace elements (MWF only due to ongoing shortage).  Reduce IVF to 50 ml/hr at 1800; changed to NS+KCl per MD request  Add insulin 10 units/L to TNA; continue SSI and Lantus 10 units/day  Magnesium Sulfate 2g IV bolus today  TNA lab panels on Mondays & Thursdays.  F/u daily.   Lynann Beaver PharmD, BCPS Pager [phone] 02/28/2012 12:38 PM

## 2012-02-28 NOTE — Progress Notes (Signed)
Pt's telemetry strips appeared to be NSR with PAC's instead of A.fib that had previously been charted. EKG shows sinus rhythm with marked sinus arrhythmia. Will continue to monitor.  Preston Moore. Clelia Croft, RN

## 2012-02-28 NOTE — Progress Notes (Signed)
Subjective: How are you doing?  "I feel like shit."  What is wrong?  My breathing feels like I can't get enough air in. How is your stomach? "Feels like shit."  How is your walking?  Poor.  Who's going to look after you after you go home. " I have 4 daughters."  Do they work and have families:  "Yes."  How can they take care of you if you can't walk"  "We always manage." He refused OT yestrerday, did get PT requiring total assist in sit to stand. Objective: Vital signs in last 24 hours: Temp:  [98 F (36.7 C)-98.5 F (36.9 C)] 98.2 F (36.8 C) (05/15 0448) Pulse Rate:  [64-98] 64  (05/15 0448) Resp:  [19-20] 20  (05/15 0448) BP: (103-123)/(41-76) 123/72 mmHg (05/15 0448) SpO2:  [93 %-97 %] 93 % (05/15 0448) Last BM Date: 02/26/12 +BM recorded yesterday, afebrile, VSS, No labs today Intake/Output from previous day: 12-Mar-2023 0701 - 05/15 0700 In: 1060 [I.V.:560; IV Piggyback:150; TPN:350] Out: 1350 [Urine:1350] Intake/Output this shift: Total I/O In: 1424 [I.V.:1424] Out: -   General appearance: alert and unhappy Resp: clear to auscultation bilaterally GI: soft, ? distended, I can't really tell, few bowel sounds.  he doesn't like clears +BM Extremities: edema both lower legs.SCD'S in place, no heparin due to anemia.  Lab Results:   Basename 03/11/2012 0500 02/26/12 0834  WBC 4.9 5.8  HGB 6.7* 6.7*  HCT 19.4* 20.1*  PLT 209 273    BMET  Basename Mar 11, 2012 0500 02/26/12 0515  NA 136 138  K 3.7 3.1*  CL 107 106  CO2 23 22  GLUCOSE 185* 254*  BUN 13 19  CREATININE 0.93 1.00  CALCIUM 6.5* 6.3*   PT/INR No results found for this basename: LABPROT:2,INR:2 in the last 72 hours   Lab 02/26/12 0515 02/24/12 0335 02/22/12 2351  AST 21 28 21   ALT 17 18 16   ALKPHOS 63 68 73  BILITOT 0.3 0.4 0.5  PROT 4.4* 4.8* 5.2*  ALBUMIN 1.6* 1.8* 2.0*     Lipase     Component Value Date/Time   LIPASE 33 03/11/2012 0500     Studies/Results: Dg Abd 2 Views  Mar 11, 2012   *RADIOLOGY REPORT*  Clinical Data: Follow up ileus.  ABDOMEN - 2 VIEW  Comparison: 02/25/2012.  Findings: The rectal contrast present on prior exam of 02/25/2011 is no longer present however there is no gaseous filling of the colon aside from a small portion of the colon in the right lower quadrant.  Small bowel dilation persists, maximally measuring 54 mm.  This is a mild interval increase compared to 02/25/2012.  IMPRESSION: Persistent small bowel obstruction, slightly worse than on the prior exam of 02/25/2012.  Paucity of colonic gas and no definite gas in the rectosigmoid.  Original Report Authenticated By: Andreas Newport, M.D.    Medications:    . calcium gluconate  1 g Intravenous Once  . cyanocobalamin  1,000 mcg Intramuscular Daily  . darbepoetin (ARANESP) injection - NON-DIALYSIS  150 mcg Subcutaneous Q Tue-1800  . folic acid  1 mg Intravenous Daily  . insulin aspart  0-15 Units Subcutaneous Q4H  . insulin glargine  10 Units Subcutaneous Daily  . iron dextran (INFED/DEXFERRUM) infusion  1,000 mg Intravenous Once  . magnesium sulfate 1 - 4 g bolus IVPB  2 g Intravenous Once  . metoCLOPramide (REGLAN) injection  5 mg Intravenous Q6H  . metoprolol  5 mg Intravenous Q6H  . pantoprazole (PROTONIX) IV  40 mg Intravenous Q12H  . potassium chloride  10 mEq Intravenous Q1 Hr x 2  . potassium phosphate IVPB (mmol)  20 mmol Intravenous Once  . sodium chloride  10-40 mL Intracatheter Q12H  . thiamine  100 mg Intravenous Daily  . DISCONTD: darbepoetin  25 mcg Subcutaneous Q7 days    Assessment/Plan *SBO (small bowel obstruction) vs ileus-favor the latter.  Active Problems:  Acute renal failure-resolved  Anemia, multifactorial:Iron deficiency anemia-hgb stable;Procrit given yesterday; no occult blood in stool B12 deficiency (B12 1000 mcg daily x 3 day, then monthly Esophageal reflux disease,known duodenal erosions, History of Etoh abuse with Pancreatitis PC malnutrition- on  TPN/Deconditioned state with failure to thrive  Hypokalemia Improved last labs DM-on SSI  Pancreatitis-5/12 lipase was normal     Plan:  B12 is ordered daily, he's had some iron and aranesp,.  His renal failure has resolved, I will continue current clears and repeat films tomorrow along with labs. See where we are.   He  Needs some diuresis, and I will start some lasix, Will be very gentle with this in light of his renal failure.  Ask pharmacy to  Change .45 NS to normal saline.   LOS: 6 days    Richrd Kuzniar 02/28/2012

## 2012-02-28 NOTE — Progress Notes (Signed)
Patient seen and examined.  Feels a little better this afternoon.  Will advance to full liquids.

## 2012-02-29 ENCOUNTER — Telehealth (INDEPENDENT_AMBULATORY_CARE_PROVIDER_SITE_OTHER): Payer: Self-pay | Admitting: General Surgery

## 2012-02-29 ENCOUNTER — Encounter (HOSPITAL_COMMUNITY): Payer: Self-pay | Admitting: General Surgery

## 2012-02-29 DIAGNOSIS — E669 Obesity, unspecified: Secondary | ICD-10-CM | POA: Diagnosis present

## 2012-02-29 DIAGNOSIS — K567 Ileus, unspecified: Secondary | ICD-10-CM

## 2012-02-29 DIAGNOSIS — E46 Unspecified protein-calorie malnutrition: Secondary | ICD-10-CM

## 2012-02-29 DIAGNOSIS — Z9119 Patient's noncompliance with other medical treatment and regimen: Secondary | ICD-10-CM

## 2012-02-29 DIAGNOSIS — Z91199 Patient's noncompliance with other medical treatment and regimen due to unspecified reason: Secondary | ICD-10-CM

## 2012-02-29 HISTORY — DX: Patient's noncompliance with other medical treatment and regimen due to unspecified reason: Z91.199

## 2012-02-29 HISTORY — DX: Ileus, unspecified: K56.7

## 2012-02-29 HISTORY — DX: Patient's noncompliance with other medical treatment and regimen: Z91.19

## 2012-02-29 HISTORY — DX: Unspecified protein-calorie malnutrition: E46

## 2012-02-29 LAB — CBC
HCT: 19.6 % — ABNORMAL LOW (ref 39.0–52.0)
Hemoglobin: 6.7 g/dL — CL (ref 13.0–17.0)
MCH: 33.2 pg (ref 26.0–34.0)
MCHC: 34.2 g/dL (ref 30.0–36.0)
RDW: 13.8 % (ref 11.5–15.5)

## 2012-02-29 LAB — COMPREHENSIVE METABOLIC PANEL
CO2: 22 mEq/L (ref 19–32)
Calcium: 7.4 mg/dL — ABNORMAL LOW (ref 8.4–10.5)
Creatinine, Ser: 0.88 mg/dL (ref 0.50–1.35)
GFR calc Af Amer: >90 mL/min (ref >90)
GFR calc non Af Amer: 82 mL/min — ABNORMAL LOW (ref >90)
Glucose, Bld: 222 mg/dL — ABNORMAL HIGH (ref 70–99)
Total Protein: 4.4 g/dL — ABNORMAL LOW (ref 6.0–8.3)

## 2012-02-29 LAB — MAGNESIUM: Magnesium: 1.6 mg/dL (ref 1.5–2.5)

## 2012-02-29 LAB — PRO B NATRIURETIC PEPTIDE: Pro B Natriuretic peptide (BNP): 1279 pg/mL — ABNORMAL HIGH (ref 0–450)

## 2012-02-29 LAB — GLUCOSE, CAPILLARY: Glucose-Capillary: 245 mg/dL — ABNORMAL HIGH (ref 70–99)

## 2012-02-29 MED ORDER — GABAPENTIN 300 MG PO CAPS
300.0000 mg | ORAL_CAPSULE | Freq: Every day | ORAL | Status: DC
  Administered 2012-02-29: 300 mg via ORAL
  Filled 2012-02-29 (×2): qty 1

## 2012-02-29 MED ORDER — METOPROLOL TARTRATE 25 MG PO TABS
25.0000 mg | ORAL_TABLET | Freq: Two times a day (BID) | ORAL | Status: DC
  Administered 2012-02-29 – 2012-03-01 (×2): 25 mg via ORAL
  Filled 2012-02-29 (×3): qty 1

## 2012-02-29 MED ORDER — TRAZODONE HCL 150 MG PO TABS: 300.0000 mg | ORAL_TABLET | Freq: Every day | ORAL | Status: DC

## 2012-02-29 MED ORDER — PANTOPRAZOLE SODIUM 40 MG PO TBEC
40.0000 mg | DELAYED_RELEASE_TABLET | Freq: Two times a day (BID) | ORAL | Status: DC
  Administered 2012-02-29 – 2012-03-01 (×2): 40 mg via ORAL
  Filled 2012-02-29 (×3): qty 1

## 2012-02-29 MED ORDER — FERROUS SULFATE 325 (65 FE) MG PO TABS
325.0000 mg | ORAL_TABLET | Freq: Two times a day (BID) | ORAL | Status: DC
  Administered 2012-02-29 – 2012-03-01 (×2): 325 mg via ORAL
  Filled 2012-02-29 (×3): qty 1

## 2012-02-29 MED ORDER — FOLIC ACID 1 MG PO TABS: 1.0000 mg | ORAL_TABLET | Freq: Every day | ORAL | Status: DC

## 2012-02-29 MED ORDER — POTASSIUM CHLORIDE IN NACL 20-0.9 MEQ/L-% IV SOLN
INTRAVENOUS | Status: DC
  Filled 2012-02-29: qty 1000

## 2012-02-29 MED ORDER — TRAZODONE HCL 150 MG PO TABS
300.0000 mg | ORAL_TABLET | Freq: Every day | ORAL | Status: DC
  Administered 2012-02-29: 300 mg via ORAL
  Filled 2012-02-29 (×2): qty 2

## 2012-02-29 MED ORDER — VITAMIN B-1 100 MG PO TABS
100.0000 mg | ORAL_TABLET | Freq: Every day | ORAL | Status: DC
  Administered 2012-03-01: 100 mg via ORAL
  Filled 2012-02-29: qty 1

## 2012-02-29 MED ORDER — METOCLOPRAMIDE HCL 5 MG/ML IJ SOLN
5.0000 mg | Freq: Three times a day (TID) | INTRAMUSCULAR | Status: DC
  Administered 2012-02-29 – 2012-03-01 (×4): 5 mg via INTRAVENOUS
  Filled 2012-02-29 (×5): qty 1

## 2012-02-29 MED ORDER — MAGNESIUM SULFATE IN D5W 10-5 MG/ML-% IV SOLN
1.0000 g | Freq: Once | INTRAVENOUS | Status: AC
  Administered 2012-02-29: 1 g via INTRAVENOUS
  Filled 2012-02-29: qty 100

## 2012-02-29 MED ORDER — INSULIN REGULAR HUMAN 100 UNIT/ML IJ SOLN
INTRAVENOUS | Status: DC
  Administered 2012-02-29: 18:00:00 via INTRAVENOUS
  Filled 2012-02-29: qty 2300

## 2012-02-29 MED ORDER — FUROSEMIDE 20 MG PO TABS
20.0000 mg | ORAL_TABLET | Freq: Every day | ORAL | Status: DC
  Administered 2012-03-01: 20 mg via ORAL
  Filled 2012-02-29: qty 1

## 2012-02-29 MED ORDER — FOLIC ACID 1 MG PO TABS
1.0000 mg | ORAL_TABLET | Freq: Every day | ORAL | Status: DC
  Administered 2012-03-01: 1 mg via ORAL
  Filled 2012-02-29: qty 1

## 2012-02-29 NOTE — Progress Notes (Signed)
PARENTERAL NUTRITION CONSULT NOTE - FOLLOW UP  Pharmacy Consult for TNA Indication: SBO vs Ileus  No Known Allergies  Patient Measurements: Height: 5\' 9"  (175.3 cm) Weight: 240 lb 8.4 oz (109.1 kg) IBW/kg (Calculated) : 70.7  Adjusted Body Weight: 84  Vital Signs: Temp: 98.1 F (36.7 C) (05/16 0639) Temp src: Oral (05/16 0639) BP: 123/73 mmHg (05/16 0639) Pulse Rate: 84  (05/16 0639) Intake/Output from previous day: 05/15 0701 - 05/16 0700 In: 1904 [P.O.:480; I.V.:1424] Out: 4175 [Urine:4175] Labs:  Basename 02/29/12 0345 02/27/12 0500  WBC 4.5 4.9  HGB 6.7* 6.7*  HCT 19.6* 19.4*  PLT 259 209  APTT -- --  INR -- --     Basename 02/29/12 0345 02/28/12 1045 02/27/12 0500  NA 133* 133* 136  K 4.2 4.7 3.7  CL 104 105 107  CO2 22 21 23   GLUCOSE 222* 224* 185*  BUN 10 10 13   CREATININE 0.88 0.86 0.93  LABCREA -- -- --  CREAT24HRUR -- -- --  CALCIUM 7.4* 6.9* 6.5*  MG 1.6 1.4* 1.4*  PHOS 2.8 2.7 1.8*  PROT 4.4* -- --  ALBUMIN 1.5* -- --  AST 15 -- --  ALT 14 -- --  ALKPHOS 65 -- --  BILITOT 0.2* -- --  BILIDIR -- -- --  IBILI -- -- --  PREALBUMIN -- -- --  TRIG -- -- --  CHOLHDL -- -- --  CHOL -- -- --  5/16 Corrected Ca 9.4 Estimated Creatinine Clearance: 88.3 ml/min (by C-G formula based on Cr of 0.88).    Basename 02/29/12 0753 02/29/12 0416 02/29/12 0011  GLUCAP 184* 229* 245*    Medications:  Scheduled:     . cyanocobalamin  1,000 mcg Intramuscular Daily  . darbepoetin (ARANESP) injection - NON-DIALYSIS  150 mcg Subcutaneous Q Tue-1800  . folic acid  1 mg Intravenous Daily  . furosemide  20 mg Intravenous Daily  . insulin aspart  0-15 Units Subcutaneous Q4H  . insulin glargine  10 Units Subcutaneous Daily  . magnesium sulfate 1 - 4 g bolus IVPB  2 g Intravenous Once  . metoCLOPramide (REGLAN) injection  5 mg Intravenous Q6H  . metoprolol  5 mg Intravenous Q6H  . pantoprazole (PROTONIX) IV  40 mg Intravenous Q12H  . sodium chloride  10-40  mL Intracatheter Q12H  . thiamine  100 mg Intravenous Daily   Infusions:     . sodium chloride Stopped (02/25/12 1555)  . 0.9 % NaCl with KCl 20 mEq / L 80 mL/hr at 02/28/12 1243  . 0.9 % NaCl with KCl 20 mEq / L    . TPN (CLINIMIX) +/- additives 70 mL/hr at 02/28/12 1733   And  . fat emulsion 240 mL (02/28/12 1734)  . TPN (CLINIMIX) +/- additives 40 mL/hr at 02/27/12 1812  . DISCONTD: 0.45 % NaCl with KCl 20 mEq / L 80 mL/hr at 02/27/12 2201   PRN: acetaminophen, bisacodyl, ondansetron, sodium chloride, DISCONTD:  morphine injection   Nutritional Goals:   Per RD recs (5/12):  Kcal: 1800-2000, Protein: 105-115 g/day, Fluid: >1.8L/day  Clinimix E 5/15 @ 33ml/hr + Lipid 20% at 27ml/hr MWF to provide and average of 1836 kcal/d and 114g protein  Current nutrition:   Clinimix E 5/15 @ 53ml/hr  Diet:  FLD started 5/15, PO intake charted  CBGs & Insulin requirements past 24 hours:  CBGs: 224, 217, 192, 239, 245, 229, 184 TNA insulin: 10 units/L Lantus insulin: 10mg  started 5/14 Moderate SSI: 28  units  IVF: NS + KCL 20 mEq/L @ 50 ml/hr, NS @KVO   Labs:   Scr was elevated but is now wnl  Electrolytes: Na remains slightly low (unable to adjust in premixed TNA), Mag improved but low end of normal.   K, Phos and Corrected Ca wnl.  LFTs  wnl (5/16)  Chol/TG wnl (5/13)  Preablumin low at 5.0 (5/13)  Assessment:   77 YOM admitted 5/9 who was recently here for pancreatitis (5/4-9) and then d/c to [facility] home. Readmitted within 24 hours of that discharge with vomiting, no BM.  Pt was started on TNA 5/12 PM for suspected SBO vs ileus, and PCM (Pt w/hx 0.5 gal hard ETOH weekly PTA).  Electrolytes improving, CBG elevated  Maintenance IVF changed from 0.45% NS with KCl to 0.9% NS with KCl to improve Na.  Additional Rx:  IV iron dextran (5/14); Aranesp (5/14), B12 injection, folic acid, thiamine, Reglan 5 mg Q6h, IV protonix  Plan:  Advance to goal rate of Clinimix E  5/15 at 95 ml/hr  Fat emulsion at 10 ml/hr (MWF only due to ongoing shortage).  TNA to contain standard multivitamins and trace elements (MWF only due to ongoing shortage).  Reduce IVF to 20 ml/hr at 1800  Increase insulin to 35 units in TNA; continue SSI and Lantus 10 units/day  Magnesium Sulfate 1g IV bolus today  TNA lab panels on Mondays & Thursdays.  F/u daily.   Lynann Beaver PharmD, BCPS Pager [phone] 02/29/2012 9:08 AM

## 2012-02-29 NOTE — Progress Notes (Signed)
  Subjective: To weak to walk in halls.  Objective: Vital signs in last 24 hours: Temp:  [97.3 F (36.3 C)-98.8 F (37.1 C)] 98.1 F (36.7 C) (05/16 0639) Pulse Rate:  [77-85] 84  (05/16 0639) Resp:  [18-20] 18  (05/16 0639) BP: (114-123)/(65-73) 123/73 mmHg (05/16 0639) SpO2:  [95 %-97 %] 97 % (05/16 0639) Weight:  [109.1 kg (240 lb 8.4 oz)] 109.1 kg (240 lb 8.4 oz) (05/16 0639) Last BM Date: 02/29/12  2 stools recorded yesterday, 1 today, taking full liquids, VSS, No labs, still does not want transfusion  Intake/Output from previous day: 05/15 0701 - 05/16 0700 In: 1904 [P.O.:480; I.V.:1424] Out: 4175 [Urine:4175] Intake/Output this shift: Total I/O In: 520 [P.O.:520] Out: 900 [Urine:900]  General appearance: alert, cooperative and no distress Resp: BS down in bases, otherwise clear GI: soft, non-tender; bowel sounds normal; no masses,  no organomegaly Extremities: edema both lower legs  Lab Results:   Basename 02/29/12 0345 02/27/12 0500  WBC 4.5 4.9  HGB 6.7* 6.7*  HCT 19.6* 19.4*  PLT 259 209    BMET  Basename 02/29/12 0345 02/28/12 1045  NA 133* 133*  K 4.2 4.7  CL 104 105  CO2 22 21  GLUCOSE 222* 224*  BUN 10 10  CREATININE 0.88 0.86  CALCIUM 7.4* 6.9*   PT/INR No results found for this basename: LABPROT:2,INR:2 in the last 72 hours   Lab 02/29/12 0345 02/26/12 0515 02/24/12 0335 02/22/12 2351  AST 15 21 28 21   ALT 14 17 18 16   ALKPHOS 65 63 68 73  BILITOT 0.2* 0.3 0.4 0.5  PROT 4.4* 4.4* 4.8* 5.2*  ALBUMIN 1.5* 1.6* 1.8* 2.0*     Lipase     Component Value Date/Time   LIPASE 33 02/27/2012 0500     Studies/Results: No results found.  Medications:    . cyanocobalamin  1,000 mcg Intramuscular Daily  . darbepoetin (ARANESP) injection - NON-DIALYSIS  150 mcg Subcutaneous Q Tue-1800  . folic acid  1 mg Intravenous Daily  . furosemide  20 mg Intravenous Daily  . insulin aspart  0-15 Units Subcutaneous Q4H  . insulin glargine  10  Units Subcutaneous Daily  . magnesium sulfate 1 - 4 g bolus IVPB  1 g Intravenous Once  . magnesium sulfate 1 - 4 g bolus IVPB  2 g Intravenous Once  . metoCLOPramide (REGLAN) injection  5 mg Intravenous Q6H  . metoprolol  5 mg Intravenous Q6H  . pantoprazole (PROTONIX) IV  40 mg Intravenous Q12H  . sodium chloride  10-40 mL Intracatheter Q12H  . thiamine  100 mg Intravenous Daily    Assessment/Plan *SBO (small bowel obstruction) vs ileus-favor the latter. He's Active Problems:  Acute renal failure-resolved  Anemia, multifactorial:Iron deficiency anemia-hgb stable;Procrit given yesterday; no occult blood in stool  B12 deficiency (B12 1000 mcg daily x 3 day, then monthly  Esophageal reflux disease,known duodenal erosions,  History of Etoh abuse with Pancreatitis  PC malnutrition- on TPN/Deconditioned state with failure to thrive  Hypokalemia Improved last labs  DM-on SSI  Pancreatitis-5/12 lipase was normal  Plan:  Advance diet, continue to try and mobilize, wean TNA, encouraged him to reconsider transfusion, which he says he still refuses. Aim for D/C to LTAC or SNF.  He will need medical  Management for all these issues at discharge.  Dr. Pete Glatter.    LOS: 7 days    Lennix Rotundo 02/29/2012

## 2012-02-29 NOTE — Progress Notes (Signed)
This is a courtesy note as have evaluated the patient at the request of Dr. Suzzanne Cloud as a favor to the surgical services they were requesting internal medicine reevaluation and patient/family was refusing any intervention from the tri-hospitalist. I did explain to Dr. Delman Kitten. that I'm not taking new patients and certainly would not actively officially participate in the care of her patient has not are reattached to the practice. I have seen the rectum reviewed extensively for about 45 minutes and have evaluated patient's bedside. Patient clearly has what appears to be a partial small bowel at obstruction/ileus based upon records dating back to his first hospitalization has had a fairly thorough upper GI and lower GI endoscopic workup. He as well as had radiographic workups and my findings are as follows:  #1 alcohol-induced pancreatitis currently quiescent and resolved  #2  vitamin B 12 deficiency with replacement underway  #3 diabetes mellitus type 2 controlled and compensated on the current regimen  #4 esophagitis and duodenitis with prior upper GI bleeding currently again quiescent on proton pump inhibitor  #5 acute on chronic kidney disease currently at his baseline  #6 small bowel obstruction versus ileus early being supported withTPN. Course was truly confounded by patient client/resistance to treatment issues. He clearly needs a transfusion refuses with no good basis. Clearly he continues to aggressively seek narcotic and his use exceeds objective needed. I personally listened to him over the space about 45 minutes yesterday morning while reviewing the chart in the hallway consistently and often belligerently pushing nursing for continued narcotic use. In between these he was resting comfortably certainly seem comfortable upon my exam. Additionally based upon review the records are not clear that the family is not been able in some of this process ultimately the patient is to understand  his car responded upon compliance with medical regimen and cutting down his narcotics. I would make it clear if initially that transfusion is in his best interest that he and the family understand recovery will be delayed as a result of this. Moreover when one looks some of his scans and is arterial calcifications at this level of hemoglobin he is at high risk for a cardiac or CNS event. I dramatically cut his narcotics CC is little chance of improving his bowel status. Also make every crit he is him that he will not improve lying in bed refusing therapy. Perhaps the best choice at this point is to continue T. NA, bowel/laxatives/enemas from below to get things moving more aggressively, and I would pursue an LTAC. Should he continues to not improve given the findings on CT scan he might consider an encapsulated endoscopy to rule out small bowel intraluminal lesion. Daily to improve that would necessitate surgery would be near impossible with a hemoglobin in the 6 range.  In short, I think that you providing everything you can show to being firmer about his narcotics and short of confronting the family but the realism of enabling this patient. He Musci out of bed, he must cut down his narcotic use .  I have little else to offer. Please be aware again this is a courtesy evaluation for surgical service I think doing an excellent job.

## 2012-02-29 NOTE — Progress Notes (Signed)
Patient seen and examined.  Agree with PA's note.  If he tolerates solid diet will be able to stop TPN.

## 2012-02-29 NOTE — Care Management Note (Unsigned)
    Page 1 of 1   02/29/2012     2:32:53 PM   CARE MANAGEMENT NOTE 02/29/2012  Patient:  Preston Moore, Preston Moore   Account Number:  [id number]  Date Initiated:  02/23/2012  Documentation initiated by:  DAVIS,RHONDA  Subjective/Objective Assessment:   pt with recent admission and was discharge to snf on [id number], started having nausea and vomiting, abd pain abd film-sbo     Action/Plan:   [facility]   Anticipated DC Date:  03/01/2012   Anticipated DC Plan:  SKILLED NURSING FACILITY  In-house referral  Clinical Social Worker      DC Planning Services  NA      [facility] Choice  NA   Choice offered to / List presented to:  NA   DME arranged  NA      DME agency  NA     HH arranged  NA      HH agency  NA   Status of service:  In process, will continue to follow Medicare Important Message given?  NA - LOS <3 / Initial given by admissions (If response is "NO", the following Medicare IM given date fields will be blank) Date Medicare IM given:   Date Additional Medicare IM given:    Discharge Disposition:    Per UR Regulation:  Reviewed for med. necessity/level of care/duration of stay  If discussed at Long Length of Stay Meetings, dates discussed:    Comments:  02/29/12 [facility] RN,BSN NCM [phone] PATIENT DOES NOT HAVE LTACH BENEFIT-CONTACTED [facility](NO BENEFIT).[facility] FOLLOWING-ADVANCING DIET,WEAN TNA.D/C PLAN RETURN SNF.CSW FOLLOWING.  [id number] Earlene Plater, RN, BSN, CCM No discharge needs present at time of this review at the sdu/icu level. Case Management [phone]  [id number] Earlene Plater, RN, BSN, CCM No discharge needs present at time of this review at the sdu/icu level. Case Management [phone]

## 2012-02-29 NOTE — Discharge Summary (Signed)
Physician Discharge Summary  Patient ID: Preston Moore MRN: [id number] DOB/AGE: 16-Aug-1934 78 y.o.  Admit date: 02/22/2012 Discharge date: 03/01/2012  Admission Diagnoses: SBO vs ileus Hospitalized 02/17/12 - 02/22/12 with:  Acute renal failure creatinine 3.85  Hyperkalemia  Nausea and vomiting  Diarrhea  Iron deficiency anemia  Lactic acidosis  Pancreatitis / possibly alcholic  Physical deconditioning  B12 deficiency  Esophageal reflux disease  AODM  Now back 02/23/12 with: SBO/no hx of prior abd surgery.  Hx GI bleed, followed by DR. [name] Possible ETOH use  Discharge Diagnoses: Ileus with multiple medical issues. Acute renal failure-resolved  Anemia, multifactorial:Iron deficiency anemia-hgb stable;Procrit given yesterday; no occult blood in stool  B12 deficiency (B12 1000 mcg daily x 3 day, then monthly  Esophageal reflux disease,known duodenal erosions,  History of Etoh abuse with Pancreatitis  PC malnutrition- on TPN/Deconditioned state with failure to thrive  Hypokalemia Improved last labs  DM-on SSI  Pancreatitis-5/12 lipase was normal  AODM ID Fluid overload  Principal Problem:  *SBO (small bowel obstruction) Active Problems:  Iron deficiency anemia  B12 deficiency  Ileus  Malnutrition  Obesity (BMI 30.0-34.9)  Non compliance with medical treatment  Acute renal failure  Esophageal reflux disease   PROCEDURES: None  Hospital Course: 78 yo wm who was hospitalized last week with vomiting and diarrhea. He was probably dehydrated and had renal insufficiency. He may have also had pancreatitis which contributed to this. He gradually improved and was discharged earlier today but family says he was still vomiting earlier before he left the hospital and had not eaten anything or had a bm in days. He returns tonight after an episode of bilious vomiting. He denies any fever. CT scan was obtaine d and showed Diffuse distension of small bowel loops, to 4.2 cm in maximal  diameter. There is gradual decompression of the small bowel to the level of the terminal ileum, with mild surrounding soft tissue stranding and fluid tracking about small bowel loops. This could reflect partial small bowel obstruction, or small bowel dysmotility; no definite focal transition point is seen to suggest high-grade obstruction Small right pleural effusion seen. Diffuse coronary artery calcification noted.  Small right inguinal hernia, containing only fat.  Mildly enlarged prostate.We admitted at families request, pt was placed on bowel rest, 800 ml thru NG over night, medicine consult was obtained and followed thru 02/24/12. A PICC line was placed/TNA was started because of his long standing episode of N/V, dehydration, pancreatitis, prior to this admission, 5/4-02/22/12. He pulled his NG out on 5/13, he was severly anemic and we tried to transfuse him but he refused.  He was not will to ambulate, or participate in much of his therapy. Hematology-Oncology consult with Dr. Dalene Carrow was obtained it was her opinion he had multiple reasons including GI bleed, with duodenal erosions, pancreatitis,with ETOH use, Iron deficiency, and B-12 deficiency to explain anemia.  She agreed with IV iron , Aranesp, and B-12 replacement which were all being done. He was also place on reglan for his GI motility.  He is being advanced to a carb-modified diet and if he does well we hope to send him home to SNF tomorrow.  I have talked with his daughter on the phone 02/29/12, and she is happy with him returning to the current facility, she has power of attorney.  She has talked with DR. Stoneking who will follow the patient at the home and in the future. He's doing well today, eating without difficulty, multiple BM's yesterday,  no complaint.  MG is low again and we will get pharmacy to replace that before his PICC is removed.  Stop Reglan, and plan to send back to SNF latter today.    CBC    Component Value Date/Time   WBC 4.4  03/01/2012 0510   RBC 1.88* 03/01/2012 0510   HGB 6.3* 03/01/2012 0510   HCT 18.2* 03/01/2012 0510   PLT 243 03/01/2012 0510   MCV 96.8 03/01/2012 0510   MCH 33.5 03/01/2012 0510   MCHC 34.6 03/01/2012 0510   RDW 14.0 03/01/2012 0510   LYMPHSABS 1.4 02/26/2012 0834   MONOABS 0.3 02/26/2012 0834   EOSABS 0.0 02/26/2012 0834   BASOSABS 0.0 02/26/2012 0834   CMP     Component Value Date/Time   NA 137 03/01/2012 0510   K 4.0 03/01/2012 0510   CL 105 03/01/2012 0510   CO2 24 03/01/2012 0510   GLUCOSE 240* 03/01/2012 0510   BUN 10 03/01/2012 0510   CREATININE 0.97 03/01/2012 0510   CALCIUM 7.7* 03/01/2012 0510   PROT 4.4* 03/01/2012 0510   ALBUMIN 1.4* 03/01/2012 0510   AST 14 03/01/2012 0510   ALT 12 03/01/2012 0510   ALKPHOS 66 03/01/2012 0510   BILITOT 0.1* 03/01/2012 0510   GFRNONAA 79* 03/01/2012 0510   GFRAA >90 03/01/2012 0510   .mag- 1.5    Follow up with DR. Stoneking  Disposition: 03-Skilled Nursing Facility   Medication List  As of 03/01/2012 12:09 PM   STOP taking these medications         HYDROcodone-acetaminophen 7.5-500 MG per tablet      oxyCODONE 5 MG immediate release tablet         TAKE these medications         acetaminophen 325 MG tablet   Commonly known as: TYLENOL   Take 2 tablets (650 mg total) by mouth every 4 (four) hours as needed.      CINNAMON PO   Take 1 each by mouth daily.      cyanocobalamin 1000 MCG/ML injection   Commonly known as: (VITAMIN B-12)   Inject 1 mL (1,000 mcg total) into the skin daily.      ferrous sulfate 325 (65 FE) MG tablet   Take 1 tablet (325 mg total) by mouth 2 (two) times daily with a meal.      folic acid 1 MG tablet   Commonly known as: FOLVITE   Take 1 tablet (1 mg total) by mouth daily.      furosemide 20 MG tablet   Commonly known as: LASIX   Take 20 mg by mouth daily.      gabapentin 300 MG capsule   Commonly known as: NEURONTIN   Take 300 mg by mouth at bedtime.      insulin aspart 100 UNIT/ML injection    Commonly known as: novoLOG   Inject 10 Units into the skin 3 (three) times daily before meals. If glucose over 150.      insulin glargine 100 UNIT/ML injection   Commonly known as: LANTUS   Inject 10 Units into the skin at bedtime.      metFORMIN 500 MG tablet   Commonly known as: GLUCOPHAGE   Take 1,000 mg by mouth 2 (two) times daily with a meal.      metoprolol tartrate 25 MG tablet   Commonly known as: LOPRESSOR   Take 25 mg by mouth 2 (two) times daily.      mulitivitamin with minerals  Tabs   Take 1 tablet by mouth daily.      pantoprazole 40 MG tablet   Commonly known as: PROTONIX   Take 1 tablet (40 mg total) by mouth 2 (two) times daily before a meal.      simvastatin 20 MG tablet   Commonly known as: ZOCOR   Take 20 mg by mouth at bedtime.      sucralfate 1 GM/10ML suspension   Commonly known as: CARAFATE   Take 10 mLs (1 g total) by mouth 4 (four) times daily -  with meals and at bedtime.      thiamine 100 MG tablet   Take 1 tablet (100 mg total) by mouth daily.      trazodone 300 MG tablet   Commonly known as: DESYREL   Take 300 mg by mouth at bedtime.             SignedSherrie George 03/01/2012, 12:09 PM

## 2012-03-01 LAB — COMPREHENSIVE METABOLIC PANEL
ALT: 12 U/L (ref 0–53)
Alkaline Phosphatase: 66 U/L (ref 39–117)
CO2: 24 mEq/L (ref 19–32)
Calcium: 7.7 mg/dL — ABNORMAL LOW (ref 8.4–10.5)
GFR calc Af Amer: >90 mL/min (ref >90)
GFR calc non Af Amer: 79 mL/min — ABNORMAL LOW (ref >90)
Glucose, Bld: 240 mg/dL — ABNORMAL HIGH (ref 70–99)
Potassium: 4 mEq/L (ref 3.5–5.1)
Sodium: 137 mEq/L (ref 135–145)
Total Bilirubin: 0.1 mg/dL — ABNORMAL LOW (ref 0.3–1.2)

## 2012-03-01 LAB — CBC
Hemoglobin: 6.3 g/dL — CL (ref 13.0–17.0)
MCH: 33.5 pg (ref 26.0–34.0)
RBC: 1.88 MIL/uL — ABNORMAL LOW (ref 4.22–5.81)

## 2012-03-01 LAB — GLUCOSE, CAPILLARY

## 2012-03-01 MED ORDER — ACETAMINOPHEN 325 MG PO TABS: 650.0000 mg | ORAL_TABLET | ORAL | Status: AC | PRN

## 2012-03-01 MED ORDER — MAGNESIUM SULFATE IN D5W 10-5 MG/ML-% IV SOLN
1.0000 g | Freq: Once | INTRAVENOUS | Status: DC
  Filled 2012-03-01: qty 100

## 2012-03-01 MED ORDER — MAGNESIUM SULFATE 40 MG/ML IJ SOLN
2.0000 g | Freq: Once | INTRAMUSCULAR | Status: AC
  Administered 2012-03-01: 2 g via INTRAVENOUS
  Filled 2012-03-01: qty 50

## 2012-03-01 NOTE — Progress Notes (Signed)
Physical Therapy Treatment Patient Details Name: Preston Moore MRN: [id number] DOB: 1934/08/02 Today's Date: 03/01/2012 Time: 1345-1410 PT Time Calculation (min): 25 min  PT Assessment / Plan / Recommendation Comments on Treatment Session  Pt still very deconditioned and significantly limited by decreased Hgb. Participates well with sessions within limits. Continue to recommend ST rehab at [facility]. Assisted pt back to bed at pt's request.     Follow Up Recommendations  Skilled nursing facility    Barriers to Discharge        Equipment Recommendations  Defer to next venue    Recommendations for Other Services    Frequency Min 3X/week   Plan Discharge plan remains appropriate    Precautions / Restrictions Precautions Precautions: Fall Restrictions Weight Bearing Restrictions: No   Pertinent Vitals/Pain     Mobility  Bed Mobility Bed Mobility: Supine to Sit;Sit to Supine Rolling Right: 1: +2 Total assist Rolling Right: Patient Percentage: 40% Supine to Sit: Patient Percentage: 40% Details for Bed Mobility Assistance: Assist for trunk to supine/upright and bil LEs off/onto bed. Increased time. Encouraged pt to utilize bil UEs more. Bedpad used for scooting, positioning Transfers Transfers: Sit to Stand;Stand to Sit Sit to Stand: 1: +2 Total assist;From bed;From chair/3-in-1 Sit to Stand: Patient Percentage: 50% Stand to Sit: 1: +2 Total assist;To bed;To chair/3-in-1 Stand to Sit: Patient Percentage: 50% Stand Pivot Transfers: 1: +2 Total assist Stand Pivot Transfers: Patient Percentage: 50% Details for Transfer Assistance: x3. VCs safety, posture, BOS. Assist to rise, stabilze and support pt in standing. Continues to fatigue easily/quickly and sits abruptly. x2 Stand pivot bed<>recliner- once with RW, once without.  Ambulation/Gait Ambulation/Gait Assistance: Not tested (comment) Ambulation/Gait Assistance Details: Pt unable to due to fatigue.     Exercises     PT  Diagnosis:    PT Problem List:   PT Treatment Interventions:     PT Goals Acute Rehab PT Goals PT Goal: Supine/Side to Sit - Progress: Progressing toward goal PT Goal: Sit to Supine/Side - Progress: Progressing toward goal PT Goal: Sit to Stand - Progress: Progressing toward goal PT Goal: Stand to Sit - Progress: Progressing toward goal PT Transfer Goal: Bed to Chair/Chair to Bed - Progress: Progressing toward goal  Visit Information  Last PT Received On: 03/01/12 Assistance Needed: +2 PT/OT Co-Evaluation/Treatment: Yes    Subjective Data  Subjective: "I'm burnt toast" Patient Stated Goal: Rehab   Cognition  Overall Cognitive Status: Impaired Area of Impairment: Problem solving;Awareness of errors;Safety/judgement;Following commands;Memory Arousal/Alertness: Lethargic Behavior During Session: Lethargic Following Commands: Follows one step commands with increased time Safety/Judgement: Decreased safety judgement for tasks assessed;Decreased awareness of safety precautions    Balance     End of Session PT - End of Session Equipment Utilized During Treatment: Gait belt Activity Tolerance: Patient limited by fatigue Patient left: in bed;with call bell/phone within reach    Rebeca Alert [employer] 03/01/2012, 3:03 PM [phone]

## 2012-03-01 NOTE — Progress Notes (Signed)
Patient seen and examined.  Agree with PA's note.  Spoke with his daughter last night.

## 2012-03-01 NOTE — Progress Notes (Signed)
Occupational Therapy Treatment Patient Details Name: Preston Moore MRN: [id number] DOB: 09-04-1934 Today's Date: 03/01/2012 Time: 1345-1410 OT Time Calculation (min): 25 min  OT Assessment / Plan / Recommendation Comments on Treatment Session Pt. motivated, but very limited by fatigue/impaired endurance    Follow Up Recommendations  Skilled nursing facility    Barriers to Discharge       Equipment Recommendations  Defer to next venue    Recommendations for Other Services    Frequency Min 1X/week   Plan Discharge plan remains appropriate    Precautions / Restrictions Precautions Precautions: Fall Restrictions Weight Bearing Restrictions: No       ADL  Toilet Transfer: Simulated;+2 Total assistance (Pt. ~50% bed to recliner, and recliner to bed) Toilet Transfer: Patient Percentage: 50% Toilet Transfer Method: Stand pivot Transfers/Ambulation Related to ADLs: Pt unable to ambulate.  Pt. transferred with +2 assist ADL Comments: Pt too fatigued to participate in ADLs.  Pt. moved to EOB, and sat EOB with min guard to max A.  Pt. would lose balance posteriorly    OT Diagnosis:    OT Problem List:   OT Treatment Interventions:     OT Goals Acute Rehab OT Goals Time For Goal Achievement: 03/13/12 Potential to Achieve Goals: Good ADL Goals ADL Goal: Grooming - Progress: Progressing toward goals ADL Goal: Toilet Transfer - Progress: Progressing toward goals Miscellaneous OT Goals OT Goal: Miscellaneous Goal #1 - Progress: Progressing toward goals  Visit Information  Last OT Received On: 03/01/12 Assistance Needed: +2    Subjective Data      Prior Functioning       Cognition  Overall Cognitive Status: Impaired Area of Impairment: Problem solving;Awareness of errors;Safety/judgement;Following commands;Memory (slow processing) Arousal/Alertness: Lethargic Orientation Level: Disoriented to;Place;Time;Situation Behavior During Session: Lethargic Current Attention  Level: Selective Memory: Decreased recall of precautions Following Commands: Follows one step commands with increased time Safety/Judgement: Decreased safety judgement for tasks assessed;Decreased awareness of safety precautions    Mobility Bed Mobility Bed Mobility: Supine to Sit;Sit to Supine Rolling Right: 1: +2 Total assist Rolling Right: Patient Percentage: 40% Supine to Sit: Patient Percentage: 40% Details for Bed Mobility Assistance: Assist for trunk to supine/upright and bil LEs off/onto bed. Increased time. Encouraged pt to utilize bil UEs more. Bedpad used for scooting, positioning Transfers Transfers: Sit to Stand;Stand to Sit Sit to Stand: 1: +2 Total assist;From bed;From chair/3-in-1 Sit to Stand: Patient Percentage: 50% Stand to Sit: 1: +2 Total assist;To bed;To chair/3-in-1 Stand to Sit: Patient Percentage: 50% Details for Transfer Assistance: x3. VCs safety, posture, BOS. Assist to rise, stabilze and support pt in standing. Continues to fatigue easily/quickly and sits abruptly. x2 Stand pivot bed<>recliner- once with RW, once without.    Exercises    Balance    End of Session OT - End of Session Equipment Utilized During Treatment: Gait belt (RW) Activity Tolerance: Patient limited by fatigue Patient left: in bed;with call bell/phone within reach   [place], Correll Denbow M 03/01/2012, 3:22 PM

## 2012-03-01 NOTE — Progress Notes (Signed)
CSW continues to follow for d/c planning to [facility] when medically stable.  Coverage for [place] [phone]

## 2012-03-01 NOTE — Progress Notes (Signed)
Subjective: Eating lunch and feels fine.  Objective: Vital signs in last 24 hours: Temp:  [98.2 F (36.8 C)-98.8 F (37.1 C)] 98.8 F (37.1 C) (05/17 0440) Pulse Rate:  [95-96] 96  (05/17 0440) Resp:  [20-28] 24  (05/17 0440) BP: (119-148)/(68-85) 119/68 mmHg (05/17 0440) SpO2:  [94 %-97 %] 94 % (05/17 0440) Last BM Date: 03/01/12  8 stools yesterday, 640 PO, afebrile VSS, MG is low and had been replaced by Pharmacy several times. Creatinine continues to improve, Anemia ongoing by stable  Intake/Output from previous day: 05/16 0701 - 05/17 0700 In: 1280 [P.O.:640; TPN:640] Out: 4425 [Urine:4425] Intake/Output this shift: Total I/O In: 120 [P.O.:120] Out: -   General appearance: alert and cooperative, says he feels great. Resp: clear to auscultation bilaterally GI: soft, non-tender; bowel sounds normal; no masses,  no organomegaly  Lab Results:   Basename 03/01/12 0510 02/29/12 0345  WBC 4.4 4.5  HGB 6.3* 6.7*  HCT 18.2* 19.6*  PLT 243 259    BMET  Basename 03/01/12 0510 02/29/12 0345  NA 137 133*  K 4.0 4.2  CL 105 104  CO2 24 22  GLUCOSE 240* 222*  BUN 10 10  CREATININE 0.97 0.88  CALCIUM 7.7* 7.4*   PT/INR No results found for this basename: LABPROT:2,INR:2 in the last 72 hours   Lab 03/01/12 0510 02/29/12 0345 02/26/12 0515 02/24/12 0335  AST 14 15 21 28   ALT 12 14 17 18   ALKPHOS 66 65 63 68  BILITOT 0.1* 0.2* 0.3 0.4  PROT 4.4* 4.4* 4.4* 4.8*  ALBUMIN 1.4* 1.5* 1.6* 1.8*     Lipase     Component Value Date/Time   LIPASE 33 02/27/2012 0500     Studies/Results: No results found.  Medications:    . darbepoetin (ARANESP) injection - NON-DIALYSIS  150 mcg Subcutaneous Q Tue-1800  . ferrous sulfate  325 mg Oral BID WC  . folic acid  1 mg Oral Daily  . furosemide  20 mg Oral Daily  . gabapentin  300 mg Oral QHS  . insulin aspart  0-15 Units Subcutaneous Q4H  . insulin glargine  10 Units Subcutaneous Daily  . magnesium sulfate 1 - 4 g  bolus IVPB  1 g Intravenous Once  . metoCLOPramide (REGLAN) injection  5 mg Intravenous TID AC & HS  . metoprolol tartrate  25 mg Oral BID  . pantoprazole  40 mg Oral BID AC  . sodium chloride  10-40 mL Intracatheter Q12H  . thiamine  100 mg Oral Daily  . traZODone  300 mg Oral QHS  . DISCONTD: folic acid  1 mg Oral Daily  . DISCONTD: folic acid  1 mg Intravenous Daily  . DISCONTD: furosemide  20 mg Intravenous Daily  . DISCONTD: metoCLOPramide (REGLAN) injection  5 mg Intravenous Q6H  . DISCONTD: metoprolol  5 mg Intravenous Q6H  . DISCONTD: pantoprazole (PROTONIX) IV  40 mg Intravenous Q12H  . DISCONTD: thiamine  100 mg Intravenous Daily  . DISCONTD: trazodone  300 mg Oral QHS    Assessment/Plan SBO (small bowel obstruction) vs ileus-favor the latter. He's  Active Problems:  Acute renal failure-resolved  Anemia, multifactorial:Iron deficiency anemia-hgb stable;Procrit given yesterday; no occult blood in stool  B12 deficiency (B12 1000 mcg daily x 3 day, then monthly  Esophageal reflux disease,known duodenal erosions,  History of Etoh abuse with Pancreatitis  PC malnutrition- on TPN/Deconditioned state with failure to thrive  Hypokalemia Improved last labs  DM-on SSI  Pancreatitis-5/12 lipase was  normal   Plan:  Replace Mg+, remove Picc, stop TNA,  stop reglan, and transfer to SNF and DR.Stoneking's care later today.  LOS: 8 days    Preston Moore 03/01/2012

## 2012-03-01 NOTE — Progress Notes (Addendum)
PARENTERAL NUTRITION CONSULT NOTE - FOLLOW UP  Pharmacy Consult for TNA Indication: SBO vs Ileus  No Known Allergies  Patient Measurements: Height: 5\' 9"  (175.3 cm) Weight: 240 lb 8.4 oz (109.1 kg) IBW/kg (Calculated) : 70.7  Adjusted Body Weight: 84  Vital Signs: Temp: 98.8 F (37.1 C) (05/17 0440) Temp src: Oral (05/17 0440) BP: 119/68 mmHg (05/17 0440) Pulse Rate: 96  (05/17 0440) Intake/Output from previous day: 05/16 0701 - 05/17 0700 In: 1280 [P.O.:640; TPN:640] Out: 4425 [Urine:4425] Labs:  [facility] 03/01/12 0510 02/29/12 0345  WBC 4.4 4.5  HGB 6.3* 6.7*  HCT 18.2* 19.6*  PLT 243 259  APTT -- --  INR -- --     Basename 03/01/12 0510 02/29/12 0345 02/28/12 1045  NA 137 133* 133*  K 4.0 4.2 4.7  CL 105 104 105  CO2 24 22 21   GLUCOSE 240* 222* 224*  BUN 10 10 10   CREATININE 0.97 0.88 0.86  LABCREA -- -- --  CREAT24HRUR -- -- --  CALCIUM 7.7* 7.4* 6.9*  MG 1.5 1.6 1.4*  PHOS -- 2.8 2.7  PROT 4.4* 4.4* --  ALBUMIN 1.4* 1.5* --  AST 14 15 --  ALT 12 14 --  ALKPHOS 66 65 --  BILITOT 0.1* 0.2* --  BILIDIR -- -- --  IBILI -- -- --  PREALBUMIN -- -- --  TRIG -- -- --  CHOLHDL -- -- --  CHOL -- -- --   Estimated Creatinine Clearance: 80.1 ml/min (by C-G formula based on Cr of 0.97).    Basename 03/01/12 0438 03/01/12 0018 02/29/12 2013  GLUCAP 227* 179* 277*    Medications:  Scheduled:     . cyanocobalamin  1,000 mcg Intramuscular Daily  . darbepoetin (ARANESP) injection - NON-DIALYSIS  150 mcg Subcutaneous Q Tue-1800  . ferrous sulfate  325 mg Oral BID WC  . folic acid  1 mg Oral Daily  . furosemide  20 mg Oral Daily  . gabapentin  300 mg Oral QHS  . insulin aspart  0-15 Units Subcutaneous Q4H  . insulin glargine  10 Units Subcutaneous Daily  . magnesium sulfate 1 - 4 g bolus IVPB  1 g Intravenous Once  . metoCLOPramide (REGLAN) injection  5 mg Intravenous TID AC & HS  . metoprolol tartrate  25 mg Oral BID  . pantoprazole  40 mg Oral BID  AC  . sodium chloride  10-40 mL Intracatheter Q12H  . thiamine  100 mg Oral Daily  . traZODone  300 mg Oral QHS  . DISCONTD: folic acid  1 mg Oral Daily  . DISCONTD: folic acid  1 mg Intravenous Daily  . DISCONTD: furosemide  20 mg Intravenous Daily  . DISCONTD: metoCLOPramide (REGLAN) injection  5 mg Intravenous Q6H  . DISCONTD: metoprolol  5 mg Intravenous Q6H  . DISCONTD: pantoprazole (PROTONIX) IV  40 mg Intravenous Q12H  . DISCONTD: thiamine  100 mg Intravenous Daily  . DISCONTD: trazodone  300 mg Oral QHS   Infusions:     . sodium chloride Stopped (02/25/12 1555)  . TPN (CLINIMIX) +/- additives 70 mL/hr at 02/28/12 1733   And  . fat emulsion 240 mL (02/28/12 1734)  . TPN (CLINIMIX) +/- additives 95 mL/hr at 02/29/12 1743  . DISCONTD: 0.9 % NaCl with KCl 20 mEq / L 50 mL/hr (02/29/12 1039)  . DISCONTD: 0.9 % NaCl with KCl 20 mEq / L     PRN: acetaminophen, bisacodyl, ondansetron, sodium chloride   Nutritional Goals:  Per RD recs (5/12):  Kcal: 1800-2000, Protein: 105-115 g/day, Fluid: >1.8L/day  Clinimix E 5/15 @ 18ml/hr + Lipid 20% at 64ml/hr MWF to provide and average of 1836 kcal/d and 114g protein  Current nutrition:   Clinimix E 5/15 @ 31ml/hr  Diet:  FLD started 5/15, PO intake charted  CBGs & Insulin requirements past 24 hours:  CBGs: 184, 293, 266, 277, 179, 227 TNA insulin: 35 units Lantus insulin: 10mg  started 5/14 Moderate SSI: 35 units  IVF: NS @KVO   Assessment:   71 YOM admitted 5/9 who was recently here for pancreatitis (5/4-9); readmit with vomiting, no BM.  Pt was started on TNA 5/12 PM for suspected SBO vs ileus, and PCM (Pt w/hx 0.5 gal hard ETOH weekly PTA).                                                                                                                    Additional Rx:  IV iron dextran (5/14); Aranesp (5/14), B12 injection, folic acid, thiamine, Reglan 5 mg Q6h, IV protonix  Electrolytes wnl, LFTs  wnl (5/16),  Chol/TG wnl (5/13), Preablumin low at 5.0 (5/13)  CBGs remain elevated, despite additional insulin  Magnesium remains at low end of normal despite daily Magnesium replacement (5/13 -16)  Plan:  Wean TNA to Clinimix E 5/15 at 50 ml/hr  D/C Fat emulsion   Continue SSI for now and Lantus 10 units/day.    Repeat magnesium sulfate 1g IV bolus today before discharge  ** May need to increase Lantus insulin if CBGs remain elevated >200 - defer to MD   Lynann Beaver PharmD, BCPS Pager [phone] 03/01/2012 7:48 AM

## 2012-03-04 NOTE — Discharge Summary (Signed)
Agree with discharge summary.  Narcotics contributed to his ileus and mental status changes.  Once these were decreased he improved.

## 2013-04-24 ENCOUNTER — Other Ambulatory Visit: Payer: Self-pay | Admitting: Geriatric Medicine

## 2013-04-24 DIAGNOSIS — M4800 Spinal stenosis, site unspecified: Secondary | ICD-10-CM

## 2013-04-24 DIAGNOSIS — M545 Low back pain: Secondary | ICD-10-CM

## 2013-05-01 ENCOUNTER — Other Ambulatory Visit: Payer: Medicare Other

## 2013-05-02 ENCOUNTER — Other Ambulatory Visit: Payer: Medicare Other

## 2013-10-08 ENCOUNTER — Ambulatory Visit
Admission: RE | Admit: 2013-10-08 | Discharge: 2013-10-08 | Disposition: A | Discharge status: Home / Self Care | Payer: Medicare Other | Source: Ambulatory Visit | Attending: Geriatric Medicine | Admitting: Geriatric Medicine

## 2013-10-08 ENCOUNTER — Other Ambulatory Visit: Payer: Self-pay | Admitting: Geriatric Medicine

## 2013-10-08 DIAGNOSIS — R1010 Upper abdominal pain, unspecified: Secondary | ICD-10-CM

## 2016-12-16 ENCOUNTER — Emergency Department (HOSPITAL_COMMUNITY): Payer: Medicare Other

## 2016-12-16 ENCOUNTER — Emergency Department (HOSPITAL_COMMUNITY)
Admission: EM | Admit: 2016-12-16 | Discharge: 2016-12-16 | Disposition: A | Discharge status: Home / Self Care | Payer: Medicare Other | Attending: Emergency Medicine | Admitting: Emergency Medicine

## 2016-12-16 ENCOUNTER — Encounter (HOSPITAL_COMMUNITY): Payer: Self-pay

## 2016-12-16 DIAGNOSIS — G8929 Other chronic pain: Secondary | ICD-10-CM | POA: Diagnosis not present

## 2016-12-16 DIAGNOSIS — I10 Essential (primary) hypertension: Secondary | ICD-10-CM | POA: Diagnosis not present

## 2016-12-16 DIAGNOSIS — Z79899 Other long term (current) drug therapy: Secondary | ICD-10-CM | POA: Insufficient documentation

## 2016-12-16 DIAGNOSIS — Z794 Long term (current) use of insulin: Secondary | ICD-10-CM | POA: Insufficient documentation

## 2016-12-16 DIAGNOSIS — E119 Type 2 diabetes mellitus without complications: Secondary | ICD-10-CM | POA: Diagnosis not present

## 2016-12-16 DIAGNOSIS — M6281 Muscle weakness (generalized): Secondary | ICD-10-CM | POA: Diagnosis not present

## 2016-12-16 DIAGNOSIS — F17210 Nicotine dependence, cigarettes, uncomplicated: Secondary | ICD-10-CM | POA: Insufficient documentation

## 2016-12-16 DIAGNOSIS — R5381 Other malaise: Secondary | ICD-10-CM

## 2016-12-16 DIAGNOSIS — M5441 Lumbago with sciatica, right side: Secondary | ICD-10-CM | POA: Diagnosis not present

## 2016-12-16 DIAGNOSIS — R0602 Shortness of breath: Secondary | ICD-10-CM | POA: Diagnosis present

## 2016-12-16 LAB — I-STAT CHEM 8, ED
BUN: 19 mg/dL (ref 6–20)
Calcium, Ion: 1.03 mmol/L — ABNORMAL LOW (ref 1.15–1.40)
Chloride: 103 mmol/L (ref 101–111)
Creatinine, Ser: 1.4 mg/dL — ABNORMAL HIGH (ref 0.61–1.24)
Glucose, Bld: 261 mg/dL — ABNORMAL HIGH (ref 65–99)
HCT: 47 % (ref 39.0–52.0)
Hemoglobin: 16 g/dL (ref 13.0–17.0)
Potassium: 5 mmol/L (ref 3.5–5.1)
Sodium: 137 mmol/L (ref 135–145)
TCO2: 27 mmol/L (ref 0–100)

## 2016-12-16 MED ORDER — GABAPENTIN 300 MG PO CAPS
300.0000 mg | ORAL_CAPSULE | Freq: Once | ORAL | Status: AC
  Administered 2016-12-16: 300 mg via ORAL
  Filled 2016-12-16: qty 1

## 2016-12-16 MED ORDER — BUPIVACAINE HCL (PF) 0.5 % IJ SOLN
10.0000 mL | Freq: Once | INTRAMUSCULAR | Status: AC
  Administered 2016-12-16: 10 mL
  Filled 2016-12-16: qty 10

## 2016-12-16 NOTE — ED Triage Notes (Signed)
Pt presents via [facility] for evaluation of SOB starting today. Pt denies CP but complains of chronic back pain, states has not had morning meds. Pt in afib with hx. Pt AxO x4.

## 2016-12-16 NOTE — ED Notes (Signed)
Placed patient into a gown on the monitor did ekg shown to Dr Laneta Simmers patient is resting

## 2016-12-16 NOTE — ED Provider Notes (Signed)
[facility] DEPT Provider Note   CSN: [id number] Arrival date & time: 12/16/16  0903     History   Chief Complaint Chief Complaint  Patient presents with  . Shortness of Breath    HPI Preston Moore is a 83 y.o. male.  The history is provided by the patient.  Shortness of Breath  This is a new problem. The average episode lasts 3 hours. The problem occurs intermittently.The current episode started 1 to 2 hours ago. The problem has been resolved. Pertinent negatives include no fever, no cough, no chest pain, no vomiting and no abdominal pain. Precipitated by: exertion trying to get out of bed this morning. Treatments tried: oxygen with EMS. The treatment provided significant relief. He has had no prior hospitalizations. Associated medical issues do not include COPD, CAD, heart failure or past MI.    Past Medical History:  Diagnosis Date  . Atrial flutter ([place])   . Diabetes mellitus   . Hyperlipidemia   . Hypertension   . Ileus ([place]) 02/29/2012  . Malnutrition ([place]) 02/29/2012  . Neuropathy ([place])   . Non compliance with medical treatment 02/29/2012  . Syncope and collapse     Patient Active Problem List   Diagnosis Date Noted  . Ileus ([place]) 02/29/2012  . Malnutrition ([place]) 02/29/2012  . Obesity (BMI 30.0-34.9) 02/29/2012  . Non compliance with medical treatment 02/29/2012  . SBO (small bowel obstruction) 02/23/2012  . Pancreatitis 02/22/2012  . Physical deconditioning 02/22/2012  . B12 deficiency 02/22/2012  . Esophageal reflux disease 02/22/2012  . Acute renal failure ([place]) 02/18/2012  . Hyperkalemia 02/18/2012  . ARF (acute renal failure) ([place]) 02/18/2012  . Nausea and vomiting 02/18/2012  . Diarrhea 02/18/2012  . Iron deficiency anemia 02/18/2012  . Lactic acidosis 02/18/2012  . Dyspnea 06/08/2011  . ANEMIA, IRON DEFICIENCY 11/01/2010  . ISCHEMIC CARDIOMYOPATHY 11/01/2010  . ATRIAL arrhythmia 11/01/2010  . UNSPECIFIED PERIPHERAL VASCULAR DISEASE 10/05/2010     Past Surgical History:  Procedure Laterality Date  . BACK SURGERY    . LAMINECTOMY  1965       Home Medications    Prior to Admission medications   Medication Sig Start Date End Date Taking? Authorizing Provider  CINNAMON PO Take 1 each by mouth daily.    Historical Provider, MD  ferrous sulfate 325 (65 FE) MG tablet Take 1 tablet (325 mg total) by mouth 2 (two) times daily with a meal. 02/22/12 02/21/13  Erline Hau, MD  furosemide (LASIX) 20 MG tablet Take 20 mg by mouth daily.    Historical Provider, MD  gabapentin (NEURONTIN) 300 MG capsule Take 300 mg by mouth at bedtime.    Historical Provider, MD  insulin aspart (NOVOLOG) 100 UNIT/ML injection Inject 10 Units into the skin 3 (three) times daily before meals. If glucose over 150.    Historical Provider, MD  insulin glargine (LANTUS) 100 UNIT/ML injection Inject 10 Units into the skin at bedtime.    Historical Provider, MD  metFORMIN (GLUCOPHAGE) 500 MG tablet Take 1,000 mg by mouth 2 (two) times daily with a meal.    Historical Provider, MD  metoprolol tartrate (LOPRESSOR) 25 MG tablet Take 25 mg by mouth 2 (two) times daily.    Historical Provider, MD  Multiple Vitamin (MULITIVITAMIN WITH MINERALS) TABS Take 1 tablet by mouth daily. 02/22/12   Erline Hau, MD  pantoprazole (PROTONIX) 40 MG tablet Take 1 tablet (40 mg total) by mouth 2 (two) times daily before a meal.  02/22/12 02/21/13  Erline Hau, MD  simvastatin (ZOCOR) 20 MG tablet Take 20 mg by mouth at bedtime.    Historical Provider, MD  sucralfate (CARAFATE) 1 GM/10ML suspension Take 10 mLs (1 g total) by mouth 4 (four) times daily -  with meals and at bedtime. 02/22/12 03/23/12  Erline Hau, MD  trazodone (DESYREL) 300 MG tablet Take 300 mg by mouth at bedtime.    Historical Provider, MD    Family History Family History  Problem Relation Age of Onset  . Hypertension Neg Hx   . Coronary artery disease Neg Hx   . Diabetes  Neg Hx     Social History Social History  Substance Use Topics  . Smoking status: Current Every Day Smoker    Packs/day: 0.50    Years: 10.00    Types: Cigarettes  . Smokeless tobacco: Never Used  . Alcohol use 21.6 oz/week    36 Cans of beer per week     Allergies   Patient has no known allergies.   Review of Systems Review of Systems  Constitutional: Negative for fever.  Respiratory: Positive for shortness of breath. Negative for cough.   Cardiovascular: Negative for chest pain.  Gastrointestinal: Negative for abdominal pain and vomiting.     Physical Exam Updated Vital Signs BP 165/91   Pulse 101   Temp 97.7 F (36.5 C) (Oral)   Resp 23   SpO2 94%   Physical Exam  Constitutional: He is oriented to person, place, and time. He appears well-developed and well-nourished. No distress.  HENT:  Head: Normocephalic and atraumatic.  Nose: Nose normal.  Eyes: Conjunctivae are normal.  Neck: Neck supple. No tracheal deviation present.  Cardiovascular: Normal rate, regular rhythm and normal heart sounds.   Pulmonary/Chest: Effort normal and breath sounds normal. No respiratory distress.  Abdominal: Soft. He exhibits no distension. There is no tenderness.  Musculoskeletal:       Lumbar back: He exhibits tenderness and pain. He exhibits normal range of motion.       Back:  Neurological: He is alert and oriented to person, place, and time.  Skin: Skin is warm and dry.  Psychiatric: He has a normal mood and affect.  Vitals reviewed.    ED Treatments / Results  Labs (all labs ordered are listed, but only abnormal results are displayed) Labs Reviewed  I-STAT CHEM 8, ED - Abnormal; Notable for the following:       Result Value   Creatinine, Ser 1.40 (*)    Glucose, Bld 261 (*)    Calcium, Ion 1.03 (*)    All other components within normal limits    EKG  EKG Interpretation  Date/Time:  Saturday December 16 2016 09:09:18 EST Ventricular Rate:  106 PR Interval:     QRS Duration: 104 QT Interval:  352 QTC Calculation: 468 R Axis:   90 Text Interpretation:  Sinus tachycardia Multiple premature complexes, vent & supraven Left posterior fascicular block Low voltage, precordial leads Consider anterior infarct Nonspecific T abnormalities, inferior leads Since last tracing rate faster Otherwise no significant change Confirmed by Temperence Zenor MD, Mikyah Alamo [phone]) on 12/16/2016 9:16:14 AM       Radiology Dg Chest 2 View  Result Date: 12/16/2016 CLINICAL DATA:  Shortness of breath. EXAM: CHEST  2 VIEW COMPARISON:  Radiograph of Feb 18, 2012. FINDINGS: The heart size and mediastinal contours are within normal limits. Both lungs are clear. No pneumothorax or pleural effusion is noted. The visualized  skeletal structures are unremarkable. IMPRESSION: No active cardiopulmonary disease. Electronically Signed   By: Marijo Conception, M.D.   On: 12/16/2016 10:57    Procedures Procedures (including critical care time)  Procedure Note: Trigger Point Injection for Myofascial pain  Performed by Dr. Laneta Simmers Indication: muscle/myofascial pain Muscle body and tendon sheath of the right superior gluteal muscle(s) were injected with 0.5% bupivacaine under sterile technique for release of muscle spasm/pain. Patient tolerated well with immediate improvement of symptoms and no immediate complications following procedure.  CPT Code:   1 or 2 muscle bodies: 20552   Medications Ordered in ED Medications  gabapentin (NEURONTIN) capsule 300 mg (300 mg Oral Given 12/16/16 1022)  bupivacaine (MARCAINE) 0.5 % injection 10 mL (10 mLs Infiltration Given 12/16/16 1022)     Initial Impression / Assessment and Plan / ED Course  I have reviewed the triage vital signs and the nursing notes.  Pertinent labs & imaging results that were available during my care of the patient were reviewed by me and considered in my medical decision making (see chart for details).     83 y.o. male presents with  chronic back pain. He normally has someone to help him get out of bed and when trying to do this on his own today he got short of breath from the activity. CXR unremarkable. No significant hematologic or metabolic abnormalities to explain symptoms. He has completely resolved the shortness of breath symptoms. He has a right low back/upper buttock pain that is TTP and he states causes shooting pain in is leg. I explained this is likely a muscle group that is inflamed, worsened by the patient's inactivity and deconditioning. He needs to see PT and has home health arranged by his PCP pending. Provided local anaesthetic for more immediate relief and home gabapentin but I explained exercise and increasing activity are the only likely interventions to help this kind of pain. Plan to follow up with PCP as needed and return precautions discussed for worsening or new concerning symptoms.   Final Clinical Impressions(s) / ED Diagnoses   Final diagnoses:  Chronic right-sided low back pain with right-sided sciatica  Physical deconditioning    New Prescriptions Discharge Medication List as of 12/16/2016 11:24 AM       Leo Grosser, MD 12/16/16 1730

## 2017-04-04 ENCOUNTER — Other Ambulatory Visit: Payer: Self-pay | Admitting: Geriatric Medicine

## 2017-04-04 ENCOUNTER — Ambulatory Visit
Admission: RE | Admit: 2017-04-04 | Discharge: 2017-04-04 | Disposition: A | Discharge status: Home / Self Care | Payer: Medicare Other | Source: Ambulatory Visit | Attending: Geriatric Medicine | Admitting: Geriatric Medicine

## 2017-04-04 DIAGNOSIS — M25551 Pain in right hip: Secondary | ICD-10-CM

## 2017-04-04 DIAGNOSIS — R0600 Dyspnea, unspecified: Secondary | ICD-10-CM

## 2017-04-06 ENCOUNTER — Emergency Department (HOSPITAL_COMMUNITY): Payer: Medicare Other

## 2017-04-06 ENCOUNTER — Inpatient Hospital Stay (HOSPITAL_COMMUNITY)
Admission: EM | Admit: 2017-04-06 | Discharge: 2017-04-10 | DRG: 481 | Disposition: A | Discharge status: Skilled Nursing Facility | Payer: Medicare Other | Attending: Internal Medicine | Admitting: Internal Medicine

## 2017-04-06 ENCOUNTER — Other Ambulatory Visit: Payer: Self-pay

## 2017-04-06 ENCOUNTER — Encounter (HOSPITAL_COMMUNITY): Payer: Self-pay | Admitting: *Deleted

## 2017-04-06 DIAGNOSIS — N183 Chronic kidney disease, stage 3 unspecified: Secondary | ICD-10-CM | POA: Diagnosis present

## 2017-04-06 DIAGNOSIS — S72009A Fracture of unspecified part of neck of unspecified femur, initial encounter for closed fracture: Secondary | ICD-10-CM | POA: Diagnosis not present

## 2017-04-06 DIAGNOSIS — W1789XA Other fall from one level to another, initial encounter: Secondary | ICD-10-CM | POA: Diagnosis present

## 2017-04-06 DIAGNOSIS — S72001A Fracture of unspecified part of neck of right femur, initial encounter for closed fracture: Secondary | ICD-10-CM | POA: Diagnosis not present

## 2017-04-06 DIAGNOSIS — I129 Hypertensive chronic kidney disease with stage 1 through stage 4 chronic kidney disease, or unspecified chronic kidney disease: Secondary | ICD-10-CM | POA: Diagnosis present

## 2017-04-06 DIAGNOSIS — E1169 Type 2 diabetes mellitus with other specified complication: Secondary | ICD-10-CM | POA: Diagnosis not present

## 2017-04-06 DIAGNOSIS — G629 Polyneuropathy, unspecified: Secondary | ICD-10-CM | POA: Diagnosis present

## 2017-04-06 DIAGNOSIS — Z794 Long term (current) use of insulin: Secondary | ICD-10-CM | POA: Diagnosis not present

## 2017-04-06 DIAGNOSIS — E1151 Type 2 diabetes mellitus with diabetic peripheral angiopathy without gangrene: Secondary | ICD-10-CM | POA: Diagnosis present

## 2017-04-06 DIAGNOSIS — S72141A Displaced intertrochanteric fracture of right femur, initial encounter for closed fracture: Secondary | ICD-10-CM | POA: Diagnosis present

## 2017-04-06 DIAGNOSIS — K219 Gastro-esophageal reflux disease without esophagitis: Secondary | ICD-10-CM | POA: Diagnosis present

## 2017-04-06 DIAGNOSIS — F17210 Nicotine dependence, cigarettes, uncomplicated: Secondary | ICD-10-CM | POA: Diagnosis present

## 2017-04-06 DIAGNOSIS — W19XXXA Unspecified fall, initial encounter: Secondary | ICD-10-CM | POA: Diagnosis not present

## 2017-04-06 DIAGNOSIS — E785 Hyperlipidemia, unspecified: Secondary | ICD-10-CM | POA: Diagnosis present

## 2017-04-06 DIAGNOSIS — D62 Acute posthemorrhagic anemia: Secondary | ICD-10-CM | POA: Diagnosis not present

## 2017-04-06 DIAGNOSIS — Z79899 Other long term (current) drug therapy: Secondary | ICD-10-CM | POA: Diagnosis not present

## 2017-04-06 DIAGNOSIS — K5900 Constipation, unspecified: Secondary | ICD-10-CM | POA: Diagnosis not present

## 2017-04-06 DIAGNOSIS — Z419 Encounter for procedure for purposes other than remedying health state, unspecified: Secondary | ICD-10-CM

## 2017-04-06 DIAGNOSIS — I10 Essential (primary) hypertension: Secondary | ICD-10-CM | POA: Diagnosis not present

## 2017-04-06 DIAGNOSIS — E1122 Type 2 diabetes mellitus with diabetic chronic kidney disease: Secondary | ICD-10-CM | POA: Diagnosis present

## 2017-04-06 DIAGNOSIS — I4892 Unspecified atrial flutter: Secondary | ICD-10-CM | POA: Diagnosis present

## 2017-04-06 DIAGNOSIS — M25551 Pain in right hip: Secondary | ICD-10-CM | POA: Diagnosis present

## 2017-04-06 HISTORY — DX: Fracture of unspecified part of neck of right femur, initial encounter for closed fracture: S72.001A

## 2017-04-06 LAB — CBC WITH DIFFERENTIAL/PLATELET
Basophils Absolute: 0.1 10*3/uL (ref 0.0–0.1)
Basophils Relative: 1 %
EOS ABS: 0.1 10*3/uL (ref 0.0–0.7)
Eosinophils Relative: 1 %
HCT: 41.3 % (ref 39.0–52.0)
HEMOGLOBIN: 13.9 g/dL (ref 13.0–17.0)
LYMPHS ABS: 2.2 10*3/uL (ref 0.7–4.0)
Lymphocytes Relative: 20 %
MCH: 28.3 pg (ref 26.0–34.0)
MCHC: 33.7 g/dL (ref 30.0–36.0)
MCV: 83.9 fL (ref 78.0–100.0)
MONOS PCT: 6 %
Monocytes Absolute: 0.7 10*3/uL (ref 0.1–1.0)
NEUTROS ABS: 7.9 10*3/uL — AB (ref 1.7–7.7)
NEUTROS PCT: 72 %
Platelets: 230 10*3/uL (ref 150–400)
RBC: 4.92 MIL/uL (ref 4.22–5.81)
RDW: 14 % (ref 11.5–15.5)
WBC: 10.9 10*3/uL — ABNORMAL HIGH (ref 4.0–10.5)

## 2017-04-06 LAB — BASIC METABOLIC PANEL
ANION GAP: 10 (ref 5–15)
BUN: 26 mg/dL — ABNORMAL HIGH (ref 6–20)
CHLORIDE: 108 mmol/L (ref 101–111)
CO2: 20 mmol/L — AB (ref 22–32)
CREATININE: 1.54 mg/dL — AB (ref 0.61–1.24)
Calcium: 9 mg/dL (ref 8.9–10.3)
GFR calc non Af Amer: 41 mL/min — ABNORMAL LOW (ref >60)
GFR, EST AFRICAN AMERICAN: 47 mL/min — AB (ref >60)
Glucose, Bld: 147 mg/dL — ABNORMAL HIGH (ref 65–99)
POTASSIUM: 4.1 mmol/L (ref 3.5–5.1)
SODIUM: 138 mmol/L (ref 135–145)

## 2017-04-06 LAB — PROTIME-INR
INR: 0.96
PROTHROMBIN TIME: 12.8 s (ref 11.4–15.2)

## 2017-04-06 LAB — GLUCOSE, CAPILLARY: Glucose-Capillary: 170 mg/dL — ABNORMAL HIGH (ref 65–99)

## 2017-04-06 MED ORDER — FENTANYL CITRATE (PF) 100 MCG/2ML IJ SOLN
50.0000 ug | Freq: Once | INTRAMUSCULAR | Status: AC
  Administered 2017-04-06: 50 ug via INTRAVENOUS
  Filled 2017-04-06: qty 2

## 2017-04-06 MED ORDER — TRAZODONE HCL 50 MG PO TABS
150.0000 mg | ORAL_TABLET | Freq: Every day | ORAL | Status: DC
  Administered 2017-04-06 – 2017-04-09 (×4): 150 mg via ORAL
  Filled 2017-04-06 (×4): qty 1

## 2017-04-06 MED ORDER — ACETAMINOPHEN 500 MG PO TABS: 1000.0000 mg | ORAL_TABLET | Freq: Four times a day (QID) | ORAL | Status: DC | PRN

## 2017-04-06 MED ORDER — HYDROCODONE-ACETAMINOPHEN 5-325 MG PO TABS
1.0000 | ORAL_TABLET | Freq: Four times a day (QID) | ORAL | Status: DC | PRN
  Administered 2017-04-06 – 2017-04-07 (×3): 2 via ORAL
  Filled 2017-04-06 (×3): qty 2

## 2017-04-06 MED ORDER — GABAPENTIN 300 MG PO CAPS
300.0000 mg | ORAL_CAPSULE | Freq: Two times a day (BID) | ORAL | Status: DC
  Administered 2017-04-06 – 2017-04-10 (×7): 300 mg via ORAL
  Filled 2017-04-06 (×8): qty 1

## 2017-04-06 MED ORDER — INSULIN GLARGINE 100 UNIT/ML ~~LOC~~ SOLN
15.0000 [IU] | Freq: Every day | SUBCUTANEOUS | Status: DC
  Administered 2017-04-06 – 2017-04-08 (×3): 15 [IU] via SUBCUTANEOUS
  Filled 2017-04-06 (×3): qty 0.15

## 2017-04-06 MED ORDER — INSULIN ASPART 100 UNIT/ML ~~LOC~~ SOLN
0.0000 [IU] | Freq: Three times a day (TID) | SUBCUTANEOUS | Status: DC
  Administered 2017-04-08: 5 [IU] via SUBCUTANEOUS
  Administered 2017-04-08 (×2): 3 [IU] via SUBCUTANEOUS
  Administered 2017-04-09: 5 [IU] via SUBCUTANEOUS
  Administered 2017-04-09 (×2): 8 [IU] via SUBCUTANEOUS
  Administered 2017-04-10: 3 [IU] via SUBCUTANEOUS
  Administered 2017-04-10: 5 [IU] via SUBCUTANEOUS

## 2017-04-06 MED ORDER — DOCUSATE SODIUM 100 MG PO CAPS
100.0000 mg | ORAL_CAPSULE | Freq: Two times a day (BID) | ORAL | Status: DC
  Administered 2017-04-06 – 2017-04-10 (×7): 100 mg via ORAL
  Filled 2017-04-06 (×8): qty 1

## 2017-04-06 MED ORDER — PANTOPRAZOLE SODIUM 40 MG PO TBEC
40.0000 mg | DELAYED_RELEASE_TABLET | Freq: Two times a day (BID) | ORAL | Status: DC
  Administered 2017-04-06 – 2017-04-10 (×6): 40 mg via ORAL
  Filled 2017-04-06 (×7): qty 1

## 2017-04-06 MED ORDER — SODIUM CHLORIDE 0.9 % IV SOLN
INTRAVENOUS | Status: AC
  Administered 2017-04-06: 19:00:00 via INTRAVENOUS

## 2017-04-06 MED ORDER — ADULT MULTIVITAMIN W/MINERALS CH
1.0000 | ORAL_TABLET | Freq: Every day | ORAL | Status: DC
  Administered 2017-04-08 – 2017-04-10 (×3): 1 via ORAL
  Filled 2017-04-06 (×4): qty 1

## 2017-04-06 MED ORDER — METOPROLOL SUCCINATE ER 25 MG PO TB24
25.0000 mg | ORAL_TABLET | Freq: Every day | ORAL | Status: DC
  Administered 2017-04-07 – 2017-04-10 (×4): 25 mg via ORAL
  Filled 2017-04-06 (×4): qty 1

## 2017-04-06 MED ORDER — SIMVASTATIN 20 MG PO TABS
20.0000 mg | ORAL_TABLET | Freq: Every day | ORAL | Status: DC
  Administered 2017-04-06 – 2017-04-09 (×4): 20 mg via ORAL
  Filled 2017-04-06 (×4): qty 1

## 2017-04-06 MED ORDER — ENOXAPARIN SODIUM 30 MG/0.3ML ~~LOC~~ SOLN
30.0000 mg | SUBCUTANEOUS | Status: DC
  Administered 2017-04-06: 30 mg via SUBCUTANEOUS
  Filled 2017-04-06: qty 0.3

## 2017-04-06 MED ORDER — BISACODYL 5 MG PO TBEC
5.0000 mg | DELAYED_RELEASE_TABLET | Freq: Every day | ORAL | Status: DC | PRN
  Administered 2017-04-09: 5 mg via ORAL
  Filled 2017-04-06: qty 1

## 2017-04-06 MED ORDER — HYDROMORPHONE HCL 1 MG/ML IJ SOLN
0.5000 mg | INTRAMUSCULAR | Status: DC | PRN
  Administered 2017-04-07 – 2017-04-08 (×3): 0.5 mg via INTRAVENOUS
  Filled 2017-04-06 (×3): qty 1

## 2017-04-06 MED ORDER — SODIUM CHLORIDE 0.9 % IV SOLN: INTRAVENOUS | Status: DC

## 2017-04-06 NOTE — ED Triage Notes (Signed)
Pt coming from home and presents after fall.  Pt denies LOC, hitting his head, or taking blood thinners.  Pt was standing on a 1 foot tall bucket trying to reach for something on the top of the shelf.  Pt c/o right hip pain that increases with movement.  EMS did not note any obvious deformity.

## 2017-04-06 NOTE — Consult Note (Signed)
[name], MD  [name], PA-C  [name], PA-C                                  [facility]                [address] [place], [place]  [address]   [place]            MRN:  [id number] DOB/SEX:  May 25, 1934/male     CHIEF COMPLAINT:  Painful right hip  HISTORY: CATON POPOWSKI a 83 y.o. male with a fall at home today.  Seen in ED and diagnosed with hip fracture.  Admitted to medicine and ORS consulted for management of hip.  Denies LOC and other pains.  Has history of opposite hip fracture a few years back.   PAST MEDICAL HISTORY: Patient Active Problem List   Diagnosis Date Noted  . Closed right hip fracture, initial encounter ([place]) 04/06/2017  . Hip fracture ([place]) 04/06/2017  . CKD (chronic kidney disease), stage III   . Essential hypertension   . Type 2 diabetes mellitus with hyperlipidemia ([place])   . Ileus ([place]) 02/29/2012  . Malnutrition ([place]) 02/29/2012  . Obesity (BMI 30.0-34.9) 02/29/2012  . Non compliance with medical treatment 02/29/2012  . SBO (small bowel obstruction) ([place]) 02/23/2012  . Pancreatitis 02/22/2012  . Physical deconditioning 02/22/2012  . B12 deficiency 02/22/2012  . Esophageal reflux disease 02/22/2012  . Acute renal failure ([place]) 02/18/2012  . Hyperkalemia 02/18/2012  . ARF (acute renal failure) ([place]) 02/18/2012  . Nausea and vomiting 02/18/2012  . Diarrhea 02/18/2012  . Iron deficiency anemia 02/18/2012  . Lactic acidosis 02/18/2012  . Dyspnea 06/08/2011  . ANEMIA, IRON DEFICIENCY 11/01/2010  . ISCHEMIC CARDIOMYOPATHY 11/01/2010  . ATRIAL arrhythmia 11/01/2010  . UNSPECIFIED PERIPHERAL VASCULAR DISEASE 10/05/2010   Past Medical History:  Diagnosis Date  . Atrial flutter ([place])   . Diabetes mellitus   . Hyperlipidemia   . Hypertension   . Ileus ([place]) 02/29/2012  . Malnutrition ([place]) 02/29/2012  . Neuropathy   . Non compliance with medical treatment 02/29/2012  . Syncope and  collapse    Past Surgical History:  Procedure Laterality Date  . BACK SURGERY    . LAMINECTOMY  1965     MEDICATIONS:   Current Facility-Administered Medications:  .  0.9 %  sodium chloride infusion, , Intravenous, Continuous, Rosita Fire, MD, Last Rate: 50 mL/hr at 04/06/17 1856 .  acetaminophen (TYLENOL) tablet 1,000 mg, 1,000 mg, Oral, Q6H PRN, Rosita Fire, MD .  bisacodyl (DULCOLAX) EC tablet 5 mg, 5 mg, Oral, Daily PRN, Rosita Fire, MD .  docusate sodium (COLACE) capsule 100 mg, 100 mg, Oral, BID, Rosita Fire, MD .  enoxaparin (LOVENOX) injection 30 mg, 30 mg, Subcutaneous, Q24H, Bhandari, Osie Bond, MD .  gabapentin (NEURONTIN) capsule 300 mg, 300 mg, Oral, BID, Rosita Fire, MD .  HYDROcodone-acetaminophen (NORCO/VICODIN) 5-325 MG per tablet 1-2 tablet, 1-2 tablet, Oral, Q6H PRN, Rosita Fire, MD, 2 tablet at 04/06/17 1855 .  HYDROmorphone (DILAUDID) injection 0.5 mg, 0.5 mg, Intravenous, Q2H PRN, Rosita Fire, MD .  Derrill Memo ON 04/07/2017] insulin aspart (novoLOG) injection 0-15 Units, 0-15 Units, Subcutaneous, TID WC, Bhandari, Osie Bond, MD .  insulin glargine (LANTUS) injection 15 Units, 15 Units, Subcutaneous, QHS, Rosita Fire, MD .  Derrill Memo ON 04/07/2017] metoprolol succinate (TOPROL-XL)  24 hr tablet 25 mg, 25 mg, Oral, Daily, Rosita Fire, MD .  Derrill Memo ON 04/07/2017] multivitamin with minerals tablet 1 tablet, 1 tablet, Oral, Daily, Rosita Fire, MD .  pantoprazole (PROTONIX) EC tablet 40 mg, 40 mg, Oral, BID AC, Rosita Fire, MD .  simvastatin (ZOCOR) tablet 20 mg, 20 mg, Oral, QHS, Rosita Fire, MD .  traZODone (DESYREL) tablet 150 mg, 150 mg, Oral, QHS, Carolin Sicks, Osie Bond, MD  ALLERGIES:  No Known Allergies  REVIEW OF SYSTEMS: REVIEWED IN DETAIL IN CHART  FAMILY HISTORY:   Family History  Problem Relation Age of Onset  . Hypertension Neg Hx   . Coronary  artery disease Neg Hx   . Diabetes Neg Hx     SOCIAL HISTORY:   Social History  Substance Use Topics  . Smoking status: Current Every Day Smoker    Packs/day: 0.50    Years: 10.00    Types: Cigarettes  . Smokeless tobacco: Never Used  . Alcohol use 21.6 oz/week    36 Cans of beer per week     EXAMINATION: Vital signs in last 24 hours: Temp:  [97.6 F (36.4 C)-97.7 F (36.5 C)] 97.7 F (36.5 C) (06/22 1900) Pulse Rate:  [63-71] 66 (06/22 1900) Resp:  [16-20] 20 (06/22 1900) BP: (105-122)/(55-77) 122/58 (06/22 1900) SpO2:  [94 %-100 %] 96 % (06/22 1900)  BP (!) 122/58   Pulse 66   Temp 97.7 F (36.5 C) (Oral)   Resp 20   SpO2 96%   General Appearance:    Alert, cooperative, no distress, appears stated age  Head:    Normocephalic, without obvious abnormality, atraumatic  Eyes:    PERRL, conjunctiva/corneas clear, EOM's intact, fundi    benign, both eyes       Ears:    Normal TM's and external ear canals, both ears  Nose:   Nares normal, septum midline, mucosa normal, no drainage    or sinus tenderness  Throat:   Lips, mucosa, and tongue normal; teeth and gums normal  Neck:   Supple, symmetrical, trachea midline, no adenopathy;       thyroid:  No enlargement/tenderness/nodules; no carotid   bruit or JVD  Back:     Symmetric, no curvature, ROM normal, no CVA tenderness  Lungs:     Clear to auscultation bilaterally, respirations unlabored  Chest wall:    No tenderness or deformity  Heart:    Regular rate and rhythm, S1 and S2 normal, no murmur, rub   or gallop  Abdomen:     Soft, non-tender, bowel sounds active all four quadrants,    no masses, no organomegaly  Genitalia:    Rectal:    Extremities:   Extremities normal, atraumatic, no cyanosis or edema  Pulses:   2+ and symmetric all extremities  Skin:   Skin color, texture, turgor normal, no rashes or lesions  Lymph nodes:   Cervical, supraclavicular, and axillary nodes normal  Neurologic:   CNII-XII intact. Normal  strength, sensation and reflexes      throughout    Musculoskeletal Exam:   Right leg tender to ROM.  Other 3 extremities with FROM   DIAGNOSTIC STUDIES: Recent laboratory studies:  Recent Labs  04/06/17 1636  WBC 10.9*  HGB 13.9  HCT 41.3  PLT 230    Recent Labs  04/06/17 1636  NA 138  K 4.1  CL 108  CO2 20*  BUN 26*  CREATININE 1.54*  GLUCOSE 147*  CALCIUM  9.0   Lab Results  Component Value Date   INR 0.96 04/06/2017   INR 0.93 02/19/2012   INR 1.06 09/17/2010     Recent Radiographic Studies :  Dg Chest 2 View  Result Date: 04/04/2017 CLINICAL DATA:  Shortness of breath. EXAM: CHEST  2 VIEW COMPARISON:  Two-view chest x-ray 12/16/2016 FINDINGS: The heart is mildly enlarged. Chronic interstitial coarsening is stable. There is no edema or effusion. No focal airspace disease is present. Atherosclerotic calcifications are noted at the aortic arch. Degenerative changes of the thoracic spine are stable. IMPRESSION: 1. No acute cardiopulmonary disease or significant interval change. Electronically Signed   By: San Morelle M.D.   On: 04/04/2017 13:16   Dg Chest Port 1 View  Result Date: 04/06/2017 CLINICAL DATA:  Right hip fracture. No current chest complaints. History hypertension. EXAM: PORTABLE CHEST 1 VIEW COMPARISON:  04/04/2017 FINDINGS: Cardiac silhouette is borderline enlarged. No mediastinal or hilar masses. No evidence of adenopathy. Lungs are clear.  No convincing pleural effusion.  No pneumothorax. Skeletal structures are demineralized but grossly intact. IMPRESSION: No acute cardiopulmonary disease. Electronically Signed   By: Lajean Manes M.D.   On: 04/06/2017 15:47   Dg Hip Unilat  With Pelvis 2-3 Views Right  Result Date: 04/06/2017 CLINICAL DATA:  Right hip pain secondary to a fall today. EXAM: DG HIP (WITH OR WITHOUT PELVIS) 2-3V RIGHT COMPARISON:  None. FINDINGS: There is a comminuted intertrochanteric fracture of the proximal right femur with  avulsion of the lesser trochanter. Pelvic bones are intact. Healed left intertrochanteric fracture with intramedullary nail and screw. IMPRESSION: Acute comminuted intertrochanteric fracture of the proximal right femur. Electronically Signed   By: Lorriane Shire M.D.   On: 04/06/2017 15:24   Dg Femur, Min 2 Views Right  Result Date: 04/04/2017 CLINICAL DATA:  Persistent pain after fall several weeks prior EXAM: RIGHT FEMUR 2 VIEWS COMPARISON:  None. FINDINGS: Frontal lateral views were obtained. There is no fracture or dislocation. No abnormal periosteal reaction. There is mild narrowing of the left hip joint. There is patellofemoral joint narrowing. There is chondrocalcinosis in the knee joint. There is extensive vascular calcification in the superficial femoral and popliteal arteries. IMPRESSION: No fracture or dislocation.  No abnormal periosteal reaction. Osteoarthritic change in the hip and knee joints. No knee joint effusion. Chondrocalcinosis is noted in the knee joint, a finding that may be seen with osteoarthritis or with calcium pyrophosphate deposition disease. There is superficial femoral artery and popliteal artery atherosclerosis. Electronically Signed   By: Lowella Grip III M.D.   On: 04/04/2017 13:15    ASSESSMENT:  Right hip intertrochanteric fracture   PLAN:  Needs ORIF in hopes of sitting, standing, and potentially walking again.  Appreciate medical team help.  Plan on ORIF with troch nail tomorrow early afternoon.  Discussed risks of anesthesia, infection, and death. Will likely need rehab stay postop.  Nathanel Tallman G 04/06/2017, 7:40 PM

## 2017-04-06 NOTE — ED Notes (Signed)
Attempted to call report; RN unavailable. Will call back.

## 2017-04-06 NOTE — H&P (Addendum)
History and Physical    Preston Moore HMC:[id number] DOB: 02-Dec-1933 DOA: 04/06/2017  PCP: Lajean Manes, MD  Patient coming from:Home  Chief Complaint: fall and right hip pain.  HPI: Preston Moore is a 83 y.o. male with medical history significant of atrial flutter not on anticoagulation, currently on sinus rhythm, type 2 diabetes, hypertension, hyperlipidemia, neuropathy, presented after a fall sustaining a right hip pain and fracture. As per patient and his daughter at bedside, the family where hiding the patient's cigarette on the top of the refrigerator. Patient used bucket upside down tried to reach the cigarette on the top of refrigerator when he tripped and fell sustaining a right hip pain. He denied fever, chills, headache, dizziness, chest pain, shortness of breath. No dysuria or urgency diarrhea or constipation. He did not pass out and did not hit his head. Does not take any blood thinners. He was accompanied by his daughter at bedside in the ER.  ED Course: Imaging consistent with right hip fracture. As per ER physician, it was discussed with on-call orthopedics Dr.Dalldorf requested to admit the patient at [facility] for surgical procedure in the morning. Patient was treated with IV fluid, pain management. Labs were sent.  Review of Systems: As per HPI otherwise 10 point review of systems negative.    Past Medical History:  Diagnosis Date  . Atrial flutter ([place])   . Diabetes mellitus   . Hyperlipidemia   . Hypertension   . Ileus ([place]) 02/29/2012  . Malnutrition ([place]) 02/29/2012  . Neuropathy   . Non compliance with medical treatment 02/29/2012  . Syncope and collapse     Past Surgical History:  Procedure Laterality Date  . BACK SURGERY    . LAMINECTOMY  1965    Social History:  reports that he has been smoking Cigarettes.  He has a 5.00 pack-year smoking history. He has never used smokeless tobacco. He reports that he drinks about 21.6 oz of alcohol per week . He  reports that he does not use drugs.  No Known Allergies Allergies reviewed.  Family History  Problem Relation Age of Onset  . Hypertension Neg Hx   . Coronary artery disease Neg Hx   . Diabetes Neg Hx      Prior to Admission medications   Medication Sig Start Date End Date Taking? Authorizing Provider  acetaminophen (TYLENOL) 500 MG tablet Take 1,000 mg by mouth every 6 (six) hours as needed for mild pain or moderate pain.   Yes [provider]  furosemide (LASIX) 20 MG tablet Take 20 mg by mouth daily.   Yes [provider]  gabapentin (NEURONTIN) 300 MG capsule Take 300 mg by mouth 2 (two) times daily.    Yes [provider]  HUMALOG 100 UNIT/ML injection Take 15 Units by mouth daily as needed (for BG >150).  03/22/17  Yes [provider]  insulin glargine (LANTUS) 100 UNIT/ML injection Inject 35 Units into the skin at bedtime.    Yes [provider]  metoprolol succinate (TOPROL-XL) 25 MG 24 hr tablet Take 25 mg by mouth daily.   Yes [provider]  pantoprazole (PROTONIX) 40 MG tablet Take 1 tablet (40 mg total) by mouth 2 (two) times daily before a meal. 02/22/12 04/06/17 Yes Isaac Bliss, Rayford Halsted, MD  simvastatin (ZOCOR) 20 MG tablet Take 20 mg by mouth at bedtime.   Yes [provider]  traZODone (DESYREL) 150 MG tablet Take 150 mg by mouth at bedtime.  Yes [provider]  ferrous sulfate 325 (65 FE) MG tablet Take 1 tablet (325 mg total) by mouth 2 (two) times daily with a meal. 02/22/12 02/21/13  Isaac Bliss, Rayford Halsted, MD  metFORMIN (GLUCOPHAGE) 500 MG tablet Take 1,000 mg by mouth 2 (two) times daily with a meal.    [provider]  Multiple Vitamin (MULITIVITAMIN WITH MINERALS) TABS Take 1 tablet by mouth daily. Patient not taking: Reported on 04/06/2017 02/22/12   Isaac Bliss, Rayford Halsted, MD  sucralfate (CARAFATE) 1 GM/10ML suspension Take 10 mLs (1 g total) by mouth 4 (four) times daily -   with meals and at bedtime. 02/22/12 03/23/12  Erline Hau, MD    Physical Exam: Vitals:   04/06/17 1454  BP: 105/77  Pulse: 71  Resp: 16  Temp: 97.6 F (36.4 C)  TempSrc: Oral  SpO2: 94%      Constitutional: Pleasant elderly male lying on bed comfortable Vitals:   04/06/17 1454  BP: 105/77  Pulse: 71  Resp: 16  Temp: 97.6 F (36.4 C)  TempSrc: Oral  SpO2: 94%   Eyes: PERRL, lids and conjunctivae normal ENMT: Mucous membranes are moist. Normal dentition.  Neck: normal, supple Respiratory: clear to auscultation bilaterally, no wheezing, no crackles. Normal respiratory effort. No accessory muscle use.  Cardiovascular: Regular rate and rhythm, no murmurs / rubs / gallops. No extremity edema.  Abdomen: . Soft, bowel sound positive, nontender. Musculoskeletal: Right leg is shorter than left, no lower extremities edema. Skin: no rashes, lesions, ulcers. No induration Neurologic: Alert, awake, oriented. No focal neurological deficit. Psychiatric: Pleasant male with good mood.   Labs on Admission: I have personally reviewed following labs and imaging studies  CBC:  Recent Labs Lab 04/06/17 1636  WBC 10.9*  NEUTROABS 7.9*  HGB 13.9  HCT 41.3  MCV 83.9  PLT 381   Basic Metabolic Panel: No results for input(s): NA, K, CL, CO2, GLUCOSE, BUN, CREATININE, CALCIUM, MG, PHOS in the last 168 hours. GFR: CrCl cannot be calculated (Patient's most recent lab result is older than the maximum 21 days allowed.). Liver Function Tests: No results for input(s): AST, ALT, ALKPHOS, BILITOT, PROT, ALBUMIN in the last 168 hours. No results for input(s): LIPASE, AMYLASE in the last 168 hours. No results for input(s): AMMONIA in the last 168 hours. Coagulation Profile:  Recent Labs Lab 04/06/17 1636  INR 0.96   Cardiac Enzymes: No results for input(s): CKTOTAL, CKMB, CKMBINDEX, TROPONINI in the last 168 hours. BNP (last 3 results) No results for input(s): PROBNP in  the last 8760 hours. HbA1C: No results for input(s): HGBA1C in the last 72 hours. CBG: No results for input(s): GLUCAP in the last 168 hours. Lipid Profile: No results for input(s): CHOL, HDL, LDLCALC, TRIG, CHOLHDL, LDLDIRECT in the last 72 hours. Thyroid Function Tests: No results for input(s): TSH, T4TOTAL, FREET4, T3FREE, THYROIDAB in the last 72 hours. Anemia Panel: No results for input(s): VITAMINB12, FOLATE, FERRITIN, TIBC, IRON, RETICCTPCT in the last 72 hours. Urine analysis:    Component Value Date/Time   COLORURINE AMBER (A) 02/23/2012 0047   APPEARANCEUR TURBID (A) 02/23/2012 0047   LABSPEC 1.021 02/23/2012 0047   PHURINE 5.5 02/23/2012 0047   GLUCOSEU 100 (A) 02/23/2012 0047   HGBUR NEGATIVE 02/23/2012 0047   BILIRUBINUR SMALL (A) 02/23/2012 0047   KETONESUR TRACE (A) 02/23/2012 0047   PROTEINUR 30 (A) 02/23/2012 0047   UROBILINOGEN 1.0 02/23/2012 0047   NITRITE NEGATIVE 02/23/2012 0047   LEUKOCYTESUR  NEGATIVE 02/23/2012 0047   Sepsis Labs: !!!!!!!!!!!!!!!!!!!!!!!!!!!!!!!!!!!!!!!!!!!! @LABRCNTIP (procalcitonin:4,lacticidven:4) )No results found for this or any previous visit (from the past 240 hour(s)).   Radiological Exams on Admission: Dg Chest Port 1 View  Result Date: 04/06/2017 CLINICAL DATA:  Right hip fracture. No current chest complaints. History hypertension. EXAM: PORTABLE CHEST 1 VIEW COMPARISON:  04/04/2017 FINDINGS: Cardiac silhouette is borderline enlarged. No mediastinal or hilar masses. No evidence of adenopathy. Lungs are clear.  No convincing pleural effusion.  No pneumothorax. Skeletal structures are demineralized but grossly intact. IMPRESSION: No acute cardiopulmonary disease. Electronically Signed   By: Lajean Manes M.D.   On: 04/06/2017 15:47   Dg Hip Unilat  With Pelvis 2-3 Views Right  Result Date: 04/06/2017 CLINICAL DATA:  Right hip pain secondary to a fall today. EXAM: DG HIP (WITH OR WITHOUT PELVIS) 2-3V RIGHT COMPARISON:  None.  FINDINGS: There is a comminuted intertrochanteric fracture of the proximal right femur with avulsion of the lesser trochanter. Pelvic bones are intact. Healed left intertrochanteric fracture with intramedullary nail and screw. IMPRESSION: Acute comminuted intertrochanteric fracture of the proximal right femur. Electronically Signed   By: Lorriane Shire M.D.   On: 04/06/2017 15:24    EKG: Independently reviewed. Sinus rhythm. Non-specific changes.  Assessment/Plan  #  Hip fracture requiring operative repair, right, closed, initial encounter [facility])   -Patient had fall at home sustaining hip injury. From the history, the fall looks like he tripped on top of up side down bucket when he tried to reach out to cigarette on the top of refrigerator. He did not have any precedent symptoms. He did not hit his head and does not take any anticoagulation. -Orthopedics (Dr.Dalldorf)  was consulted by ER, recommended to transfer patient to [facility] for possible surgical intervention in the morning. Orthopedic consult appreciated. -Keep nothing by mouth from midnight, hold diuretics. -EKG unremarkable. Does not have any chest pain shortness of breath. Patient is at acceptable risk for intermediate risk surgery. -Continue IV Dilaudid, Norco as needed for pain management. Stool softener ordered. -Incentive spirometer -Follow-up orthopedics recommendation -Needs evaluation by PT OT after the procedure.  Addendum (5:47 PM): I also called [facility] and left patient's information as well as my contact information to request ortho consultation to Dr.Dalldorf.  #Hypertension: Monitor blood pressure. His blood pressure was acceptable in the ER. Continue metoprolol  #Type 2 diabetes: I will reduce the dose of Lantus to only 15 units since patient will be nothing by mouth from midnight. Monitor blood sugar table. Avoid hypoglycemic episode.  #Chronic kidney disease stage 3: Patient has baseline serum creatinine  level around 1.4-1.7 when I reviewed his lab results in care everywhere. His serum creatinine level here is 1.5 which is around baseline. Holding Lasix as he looks euvolemic. I will place him on gentle IV hydration while patient is nothing by mouth. Monitor BMP. Avoid nephrotoxins.  #Hyperlipidemia: Continue Zocor.  DVT prophylaxis: Lovenox subcutaneous Code Status: Full code Family Communication: Discussed with the patient's daughter at bedside Disposition Plan: Transferred to [facility] for possible surgical intervention tomorrow Consults called: Orthopedics was consulted by ER physician Admission status: Inpatient since patient has fracture requiring inpatient surgical intervention.   Dron Tanna Furry MD [facility] Pager [phone]  If 7PM-7AM, please contact night-coverage [email or web address] Password [email or web address]  04/06/2017, 5:11 PM

## 2017-04-06 NOTE — ED Provider Notes (Addendum)
[facility] DEPT Provider Note   CSN: [id number] Arrival date & time: 04/06/17  1437     History   Chief Complaint Chief Complaint  Patient presents with  . Fall  . Hip Pain    right    HPI Preston Moore is a 83 y.o. male.  Patient is an 83 year old male who presents with hip pain after fall. He was standing on 1 foot tall bucket trying to reach something on a shelf and fell off landing on his right side. This happened just prior to arrival. He's been complaining of pain in his right hip since that time. He has not been able to bear weight. He denies any head injury during the fall. He denies any loss of consciousness. No neck or back pain. He denies any other injuries from the fall other than the hip pain. He is not on anticoagulants. He has not taking any medications for the pain.      Past Medical History:  Diagnosis Date  . Atrial flutter ([place])   . Diabetes mellitus   . Hyperlipidemia   . Hypertension   . Ileus ([place]) 02/29/2012  . Malnutrition ([place]) 02/29/2012  . Neuropathy   . Non compliance with medical treatment 02/29/2012  . Syncope and collapse     Patient Active Problem List   Diagnosis Date Noted  . Ileus ([place]) 02/29/2012  . Malnutrition ([place]) 02/29/2012  . Obesity (BMI 30.0-34.9) 02/29/2012  . Non compliance with medical treatment 02/29/2012  . SBO (small bowel obstruction) ([place]) 02/23/2012  . Pancreatitis 02/22/2012  . Physical deconditioning 02/22/2012  . B12 deficiency 02/22/2012  . Esophageal reflux disease 02/22/2012  . Acute renal failure ([place]) 02/18/2012  . Hyperkalemia 02/18/2012  . ARF (acute renal failure) ([place]) 02/18/2012  . Nausea and vomiting 02/18/2012  . Diarrhea 02/18/2012  . Iron deficiency anemia 02/18/2012  . Lactic acidosis 02/18/2012  . Dyspnea 06/08/2011  . ANEMIA, IRON DEFICIENCY 11/01/2010  . ISCHEMIC CARDIOMYOPATHY 11/01/2010  . ATRIAL arrhythmia 11/01/2010  . UNSPECIFIED PERIPHERAL VASCULAR DISEASE 10/05/2010     Past Surgical History:  Procedure Laterality Date  . BACK SURGERY    . LAMINECTOMY  1965       Home Medications    Prior to Admission medications   Medication Sig Start Date End Date Taking? Authorizing Provider  acetaminophen (TYLENOL) 500 MG tablet Take 1,000 mg by mouth every 6 (six) hours as needed for mild pain or moderate pain.   Yes [provider]  furosemide (LASIX) 20 MG tablet Take 20 mg by mouth daily.   Yes [provider]  gabapentin (NEURONTIN) 300 MG capsule Take 300 mg by mouth 2 (two) times daily.    Yes [provider]  HUMALOG 100 UNIT/ML injection Take 15 Units by mouth daily as needed (for BG >150).  03/22/17  Yes [provider]  insulin glargine (LANTUS) 100 UNIT/ML injection Inject 35 Units into the skin at bedtime.    Yes [provider]  metoprolol succinate (TOPROL-XL) 25 MG 24 hr tablet Take 25 mg by mouth daily.   Yes [provider]  pantoprazole (PROTONIX) 40 MG tablet Take 1 tablet (40 mg total) by mouth 2 (two) times daily before a meal. 02/22/12 04/06/17 Yes Isaac Bliss, Rayford Halsted, MD  simvastatin (ZOCOR) 20 MG tablet Take 20 mg by mouth at bedtime.   Yes [provider]  traZODone (DESYREL) 150 MG tablet Take 150 mg by mouth at bedtime.   Yes [provider]  ferrous sulfate 325 (65 FE) MG tablet Take 1 tablet (325 mg total) by mouth 2 (two) times daily with a meal. 02/22/12 02/21/13  Isaac Bliss, Rayford Halsted, MD  metFORMIN (GLUCOPHAGE) 500 MG tablet Take 1,000 mg by mouth 2 (two) times daily with a meal.    [provider]  Multiple Vitamin (MULITIVITAMIN WITH MINERALS) TABS Take 1 tablet by mouth daily. Patient not taking: Reported on 04/06/2017 02/22/12   Isaac Bliss, Rayford Halsted, MD  sucralfate (CARAFATE) 1 GM/10ML suspension Take 10 mLs (1 g total) by mouth 4 (four) times daily -  with meals and at bedtime. 02/22/12 03/23/12  Erline Hau, MD    Family  History Family History  Problem Relation Age of Onset  . Hypertension Neg Hx   . Coronary artery disease Neg Hx   . Diabetes Neg Hx     Social History Social History  Substance Use Topics  . Smoking status: Current Every Day Smoker    Packs/day: 0.50    Years: 10.00    Types: Cigarettes  . Smokeless tobacco: Never Used  . Alcohol use 21.6 oz/week    36 Cans of beer per week     Allergies   Patient has no known allergies.   Review of Systems Review of Systems  Constitutional: Negative for activity change, appetite change and fever.  HENT: Negative for dental problem, nosebleeds and trouble swallowing.   Eyes: Negative for pain and visual disturbance.  Respiratory: Negative for shortness of breath.   Cardiovascular: Negative for chest pain.  Gastrointestinal: Negative for abdominal pain, nausea and vomiting.  Genitourinary: Negative for dysuria and hematuria.  Musculoskeletal: Positive for arthralgias. Negative for back pain, joint swelling and neck pain.  Skin: Negative for wound.  Neurological: Negative for weakness, numbness and headaches.  Psychiatric/Behavioral: Negative for confusion.     Physical Exam Updated Vital Signs BP 105/77   Pulse 71   Temp 97.6 F (36.4 C) (Oral)   Resp 16   SpO2 94%   Physical Exam  Constitutional: He is oriented to person, place, and time. He appears well-developed and well-nourished.  HENT:  Head: Normocephalic and atraumatic.  Nose: Nose normal.  Eyes: Conjunctivae are normal. Pupils are equal, round, and reactive to light.  Neck:  No pain to the cervical, thoracic, or LS spine.  No step-offs or deformities noted  Cardiovascular: Normal rate and regular rhythm.   No murmur heard. Pulmonary/Chest: Effort normal and breath sounds normal. No respiratory distress. He has no wheezes. He exhibits no tenderness.  Abdominal: Soft. Bowel sounds are normal. He exhibits no distension. There is no tenderness.  Musculoskeletal:  Normal range of motion.  Positive tenderness to the right hip with external rotation of the leg. Pedal pulses are intact. He has no pain on palpation of the hip or the ankle. No pain on palpation or ROM of the other extremities  Neurological: He is alert and oriented to person, place, and time.  Skin: Skin is warm and dry. Capillary refill takes less than 2 seconds.  Psychiatric: He has a normal mood and affect.  Vitals reviewed.    ED Treatments / Results  Labs (all labs ordered are listed, but only abnormal results are displayed) Labs Reviewed  CBC WITH DIFFERENTIAL/PLATELET - Abnormal; Notable for the following:       Result Value   WBC 10.9 (*)    Neutro Abs 7.9 (*)    All other components within normal limits  PROTIME-INR  BASIC  METABOLIC PANEL  TYPE AND SCREEN    EKG  EKG Interpretation  Date/Time:  Friday April 06 2017 15:47:06 EDT Ventricular Rate:  65 PR Interval:    QRS Duration: 116 QT Interval:  467 QTC Calculation: 486 R Axis:   62 Text Interpretation:  Sinus rhythm Nonspecific intraventricular conduction delay Inferior infarct, old since last tracing no significant change Confirmed by Malvin Johns [id number]) on 04/06/2017 3:58:22 PM       Radiology Dg Chest Port 1 View  Result Date: 04/06/2017 CLINICAL DATA:  Right hip fracture. No current chest complaints. History hypertension. EXAM: PORTABLE CHEST 1 VIEW COMPARISON:  04/04/2017 FINDINGS: Cardiac silhouette is borderline enlarged. No mediastinal or hilar masses. No evidence of adenopathy. Lungs are clear.  No convincing pleural effusion.  No pneumothorax. Skeletal structures are demineralized but grossly intact. IMPRESSION: No acute cardiopulmonary disease. Electronically Signed   By: Lajean Manes M.D.   On: 04/06/2017 15:47   Dg Hip Unilat  With Pelvis 2-3 Views Right  Result Date: 04/06/2017 CLINICAL DATA:  Right hip pain secondary to a fall today. EXAM: DG HIP (WITH OR WITHOUT PELVIS) 2-3V RIGHT  COMPARISON:  None. FINDINGS: There is a comminuted intertrochanteric fracture of the proximal right femur with avulsion of the lesser trochanter. Pelvic bones are intact. Healed left intertrochanteric fracture with intramedullary nail and screw. IMPRESSION: Acute comminuted intertrochanteric fracture of the proximal right femur. Electronically Signed   By: Lorriane Shire M.D.   On: 04/06/2017 15:24    Procedures Procedures (including critical care time)  Medications Ordered in ED Medications  fentaNYL (SUBLIMAZE) injection 50 mcg (not administered)  0.9 %  sodium chloride infusion (not administered)     Initial Impression / Assessment and Plan / ED Course  I have reviewed the triage vital signs and the nursing notes.  Pertinent labs & imaging results that were available during my care of the patient were reviewed by me and considered in my medical decision making (see chart for details).  Clinical Course as of Apr 06 1702  Fri Apr 06, 2017  1551 Spoke with Dr. Rhona Raider with ortho.  Requests hospitalist admission to [facility] for operative repair of hip tomorrow am  [MB]    Clinical Course User Index [MB] Malvin Johns, MD    Patient presents after mechanical fall. He has a right hip fracture. No other apparent injuries. I spoke with the hospitalist, Dr. Carolin Sicks who will admit the patient. He will be transferred to [facility] for admission per Dr. Jerald Kief request.  Final Clinical Impressions(s) / ED Diagnoses   Final diagnoses:  Fall, initial encounter  Closed right hip fracture, initial encounter [facility])    New Prescriptions New Prescriptions   No medications on file     Malvin Johns, MD 04/06/17 [place]    Malvin Johns, MD 04/06/17 [phone]

## 2017-04-07 ENCOUNTER — Encounter (HOSPITAL_COMMUNITY)
Admission: EM | Disposition: A | Discharge status: Skilled Nursing Facility | Payer: Self-pay | Source: Home / Self Care | Attending: Internal Medicine

## 2017-04-07 ENCOUNTER — Encounter (HOSPITAL_COMMUNITY): Payer: Self-pay | Admitting: Anesthesiology

## 2017-04-07 ENCOUNTER — Inpatient Hospital Stay (HOSPITAL_COMMUNITY): Payer: Medicare Other | Admitting: Certified Registered Nurse Anesthetist

## 2017-04-07 ENCOUNTER — Inpatient Hospital Stay (HOSPITAL_COMMUNITY): Payer: Medicare Other

## 2017-04-07 DIAGNOSIS — S72001A Fracture of unspecified part of neck of right femur, initial encounter for closed fracture: Secondary | ICD-10-CM

## 2017-04-07 DIAGNOSIS — W19XXXA Unspecified fall, initial encounter: Secondary | ICD-10-CM

## 2017-04-07 HISTORY — PX: COMPRESSION HIP SCREW: SHX1386

## 2017-04-07 LAB — SURGICAL PCR SCREEN
MRSA, PCR: NEGATIVE
STAPHYLOCOCCUS AUREUS: NEGATIVE

## 2017-04-07 LAB — GLUCOSE, CAPILLARY
GLUCOSE-CAPILLARY: 170 mg/dL — AB (ref 65–99)
GLUCOSE-CAPILLARY: 233 mg/dL — AB (ref 65–99)
Glucose-Capillary: 169 mg/dL — ABNORMAL HIGH (ref 65–99)
Glucose-Capillary: 179 mg/dL — ABNORMAL HIGH (ref 65–99)

## 2017-04-07 SURGERY — COMPRESSION HIP
Anesthesia: General | Site: Hip | Laterality: Right

## 2017-04-07 MED ORDER — LIDOCAINE 2% (20 MG/ML) 5 ML SYRINGE
INTRAMUSCULAR | Status: DC | PRN
  Administered 2017-04-07: 60 mg via INTRAVENOUS

## 2017-04-07 MED ORDER — ONDANSETRON HCL 4 MG/2ML IJ SOLN
INTRAMUSCULAR | Status: DC | PRN
  Administered 2017-04-07: 4 mg via INTRAVENOUS

## 2017-04-07 MED ORDER — PHENYLEPHRINE HCL 10 MG/ML IJ SOLN
INTRAVENOUS | Status: DC | PRN
  Administered 2017-04-07: 35 ug/min via INTRAVENOUS

## 2017-04-07 MED ORDER — ACETAMINOPHEN 325 MG PO TABS: 650.0000 mg | ORAL_TABLET | Freq: Four times a day (QID) | ORAL | Status: DC | PRN

## 2017-04-07 MED ORDER — FENTANYL CITRATE (PF) 250 MCG/5ML IJ SOLN
INTRAMUSCULAR | Status: AC
  Filled 2017-04-07: qty 5

## 2017-04-07 MED ORDER — MORPHINE SULFATE (PF) 4 MG/ML IV SOLN: 0.5000 mg | INTRAVENOUS | Status: DC | PRN

## 2017-04-07 MED ORDER — ONDANSETRON HCL 4 MG PO TABS: 4.0000 mg | ORAL_TABLET | Freq: Four times a day (QID) | ORAL | Status: DC | PRN

## 2017-04-07 MED ORDER — PROPOFOL 10 MG/ML IV BOLUS
INTRAVENOUS | Status: DC | PRN
Start: 2017-04-07 — End: 2017-04-07
  Administered 2017-04-07: 80 mg via INTRAVENOUS

## 2017-04-07 MED ORDER — PROPOFOL 10 MG/ML IV BOLUS
INTRAVENOUS | Status: AC
  Filled 2017-04-07: qty 20

## 2017-04-07 MED ORDER — CEFAZOLIN SODIUM-DEXTROSE 2-4 GM/100ML-% IV SOLN
INTRAVENOUS | Status: AC
  Filled 2017-04-07: qty 100

## 2017-04-07 MED ORDER — ASPIRIN EC 325 MG PO TBEC
325.0000 mg | DELAYED_RELEASE_TABLET | Freq: Two times a day (BID) | ORAL | Status: DC
  Administered 2017-04-07 – 2017-04-10 (×6): 325 mg via ORAL
  Filled 2017-04-07 (×6): qty 1

## 2017-04-07 MED ORDER — METOCLOPRAMIDE HCL 5 MG PO TABS: 5.0000 mg | ORAL_TABLET | Freq: Three times a day (TID) | ORAL | Status: DC | PRN

## 2017-04-07 MED ORDER — FENTANYL CITRATE (PF) 100 MCG/2ML IJ SOLN: 25.0000 ug | INTRAMUSCULAR | Status: DC | PRN

## 2017-04-07 MED ORDER — LACTATED RINGERS IV SOLN
INTRAVENOUS | Status: DC
  Administered 2017-04-07: 13:00:00 via INTRAVENOUS

## 2017-04-07 MED ORDER — METOCLOPRAMIDE HCL 5 MG/ML IJ SOLN: 5.0000 mg | Freq: Three times a day (TID) | INTRAMUSCULAR | Status: DC | PRN

## 2017-04-07 MED ORDER — ACETAMINOPHEN 650 MG RE SUPP: 650.0000 mg | Freq: Four times a day (QID) | RECTAL | Status: DC | PRN

## 2017-04-07 MED ORDER — ACETAMINOPHEN 500 MG PO TABS
500.0000 mg | ORAL_TABLET | Freq: Four times a day (QID) | ORAL | Status: DC | PRN
  Filled 2017-04-07: qty 1

## 2017-04-07 MED ORDER — 0.9 % SODIUM CHLORIDE (POUR BTL) OPTIME
TOPICAL | Status: DC | PRN
  Administered 2017-04-07: 1000 mL

## 2017-04-07 MED ORDER — FENTANYL CITRATE (PF) 100 MCG/2ML IJ SOLN
INTRAMUSCULAR | Status: DC | PRN
  Administered 2017-04-07 (×2): 25 ug via INTRAVENOUS
  Administered 2017-04-07: 50 ug via INTRAVENOUS
  Administered 2017-04-07 (×2): 25 ug via INTRAVENOUS

## 2017-04-07 MED ORDER — MENTHOL 3 MG MT LOZG: 1.0000 | LOZENGE | OROMUCOSAL | Status: DC | PRN

## 2017-04-07 MED ORDER — ONDANSETRON HCL 4 MG/2ML IJ SOLN: 4.0000 mg | Freq: Four times a day (QID) | INTRAMUSCULAR | Status: DC | PRN

## 2017-04-07 MED ORDER — ALBUMIN HUMAN 5 % IV SOLN
INTRAVENOUS | Status: AC
  Administered 2017-04-07: 12.5 g via INTRAVENOUS
  Filled 2017-04-07: qty 250

## 2017-04-07 MED ORDER — EPHEDRINE SULFATE 50 MG/ML IJ SOLN: INTRAMUSCULAR | Status: DC | PRN

## 2017-04-07 MED ORDER — PHENOL 1.4 % MT LIQD: 1.0000 | OROMUCOSAL | Status: DC | PRN

## 2017-04-07 MED ORDER — CEFAZOLIN SODIUM-DEXTROSE 2-4 GM/100ML-% IV SOLN
2.0000 g | Freq: Four times a day (QID) | INTRAVENOUS | Status: AC
  Administered 2017-04-07 – 2017-04-08 (×2): 2 g via INTRAVENOUS
  Filled 2017-04-07 (×3): qty 100

## 2017-04-07 MED ORDER — HYDROCODONE-ACETAMINOPHEN 5-325 MG PO TABS
1.0000 | ORAL_TABLET | Freq: Four times a day (QID) | ORAL | Status: DC | PRN
  Administered 2017-04-07 – 2017-04-10 (×6): 2 via ORAL
  Filled 2017-04-07 (×6): qty 2

## 2017-04-07 MED ORDER — LACTATED RINGERS IV SOLN
INTRAVENOUS | Status: DC | PRN
  Administered 2017-04-07 (×2): via INTRAVENOUS

## 2017-04-07 MED ORDER — CEFAZOLIN SODIUM-DEXTROSE 2-4 GM/100ML-% IV SOLN
2.0000 g | Freq: Once | INTRAVENOUS | Status: AC
  Administered 2017-04-07: 2 g via INTRAVENOUS

## 2017-04-07 MED ORDER — SUGAMMADEX SODIUM 200 MG/2ML IV SOLN
INTRAVENOUS | Status: DC | PRN
Start: 2017-04-07 — End: 2017-04-07
  Administered 2017-04-07: 200 mg via INTRAVENOUS

## 2017-04-07 MED ORDER — ALBUMIN HUMAN 5 % IV SOLN
12.5000 g | Freq: Once | INTRAVENOUS | Status: AC
  Administered 2017-04-07: 12.5 g via INTRAVENOUS

## 2017-04-07 MED ORDER — SODIUM CHLORIDE 0.9 % IV SOLN: 0.0000 ug/min | INTRAVENOUS | Status: DC

## 2017-04-07 MED ORDER — ROCURONIUM BROMIDE 100 MG/10ML IV SOLN
INTRAVENOUS | Status: DC | PRN
  Administered 2017-04-07: 40 mg via INTRAVENOUS

## 2017-04-07 MED ORDER — PHENYLEPHRINE HCL 10 MG/ML IJ SOLN
INTRAMUSCULAR | Status: DC | PRN
  Administered 2017-04-07: 200 ug via INTRAVENOUS

## 2017-04-07 SURGICAL SUPPLY — 36 items
BLADE CLIPPER SURG (BLADE) IMPLANT
COVER PERINEAL POST (MISCELLANEOUS) ×2 IMPLANT
COVER SURGICAL LIGHT HANDLE (MISCELLANEOUS) ×4 IMPLANT
DECANTER SPIKE VIAL GLASS SM (MISCELLANEOUS) IMPLANT
DRAPE STERI IOBAN 125X83 (DRAPES) ×2 IMPLANT
DRSG MEPILEX BORDER 4X4 (GAUZE/BANDAGES/DRESSINGS) ×2 IMPLANT
DRSG MEPILEX BORDER 4X8 (GAUZE/BANDAGES/DRESSINGS) ×2 IMPLANT
DURAPREP 26ML APPLICATOR (WOUND CARE) ×2 IMPLANT
ELECT REM PT RETURN 9FT ADLT (ELECTROSURGICAL) ×2
ELECTRODE REM PT RTRN 9FT ADLT (ELECTROSURGICAL) ×1 IMPLANT
GAUZE XEROFORM 5X9 LF (GAUZE/BANDAGES/DRESSINGS) ×2 IMPLANT
GLOVE BIOGEL PI IND STRL 8 (GLOVE) ×2 IMPLANT
GLOVE BIOGEL PI INDICATOR 8 (GLOVE) ×2
GLOVE ECLIPSE 7.5 STRL STRAW (GLOVE) ×4 IMPLANT
GOWN STRL REUS W/ TWL LRG LVL3 (GOWN DISPOSABLE) ×1 IMPLANT
GOWN STRL REUS W/ TWL XL LVL3 (GOWN DISPOSABLE) ×2 IMPLANT
GOWN STRL REUS W/TWL LRG LVL3 (GOWN DISPOSABLE) ×1
GOWN STRL REUS W/TWL XL LVL3 (GOWN DISPOSABLE) ×2
GUIDEWIRE BALL NOSE 80CM (WIRE) ×2 IMPLANT
HFN RH 130 DEG 11MM X 360MM (Orthopedic Implant) ×2 IMPLANT
KIT BASIN OR (CUSTOM PROCEDURE TRAY) ×2 IMPLANT
KIT ROOM TURNOVER OR (KITS) ×2 IMPLANT
MANIFOLD NEPTUNE II (INSTRUMENTS) ×2 IMPLANT
NS IRRIG 1000ML POUR BTL (IV SOLUTION) ×2 IMPLANT
PACK GENERAL/GYN (CUSTOM PROCEDURE TRAY) ×2 IMPLANT
PAD ARMBOARD 7.5X6 YLW CONV (MISCELLANEOUS) ×4 IMPLANT
PIN GUIDE 3.2 903003004 (MISCELLANEOUS) ×4 IMPLANT
SCREW LAG 10.5MMX105MM HFN (Screw) IMPLANT
SCREW LAG 10.5X120MM (Screw) ×2 IMPLANT
STAPLER VISISTAT 35W (STAPLE) ×4 IMPLANT
SUT VIC AB 0 CTB1 27 (SUTURE) ×2 IMPLANT
SUT VIC AB 1 CTB1 27 (SUTURE) ×2 IMPLANT
SUT VIC AB 2-0 CTB1 (SUTURE) ×2 IMPLANT
TOWEL OR 17X24 6PK STRL BLUE (TOWEL DISPOSABLE) ×2 IMPLANT
TOWEL OR 17X26 10 PK STRL BLUE (TOWEL DISPOSABLE) ×2 IMPLANT
WATER STERILE IRR 1000ML POUR (IV SOLUTION) IMPLANT

## 2017-04-07 NOTE — Progress Notes (Signed)
Nutrition Brief Note  Received consult for assessment of nutrition status from the Hip Fx admission order set.   No current height or weight available. Patient reports good appetite, good PO intake, height 5'9", and stable weight ~233 lbs PTA.  Nutrition-Focused physical exam completed. Findings are no fat depletion, no muscle depletion, and no edema.   Current diet order is NPO for ORIF hip fx this afternoon. Labs and medications reviewed.   Patient is well-nourished. No nutrition interventions warranted at this time. If nutrition issues arise, please consult RD.   Molli Barrows, RD, LDN, [place] Pager [phone] After Hours Pager [phone]

## 2017-04-07 NOTE — Progress Notes (Signed)
[facility] PROGRESS NOTE  Preston Moore FOY:[id number] DOB: [birth year] DOA: 04/06/2017   PCP: Lajean Manes, MD     Assessment/Plan: Active Problems:   Closed right hip fracture, initial encounter [facility])   Hip fracture [facility])   Fall    82 y.o. male with medical history significant of atrial flutter not on anticoagulation, currently on sinus rhythm, type 2 diabetes, hypertension, hyperlipidemia, neuropathy, presented after a fall sustaining a right hip pain and fracture.  Assessment and plan #  Hip fracture requiring operative repair, right, closed, initial encounter [facility])   He did not hit his head and does not take any anticoagulation. -Orthopedics (Dr.Dalldorf)  was consulted by ER, recommended to transfer patient to [facility] for possible surgical intervention in the morning.  ORIF 6/23 -EKG unremarkable. Does not have any chest pain shortness of breath. Patient is at acceptable risk for intermediate risk surgery. -Continue IV Dilaudid, Norco as needed for pain management. Stool softener ordered. -Incentive spirometer -DVT prophylaxis as per orthopedics, Lovenox Will likely need SNF    #Hypertension: Monitor blood pressure. His blood pressure was acceptable in the ER. Continue metoprolol  #Type 2 diabetes: Accu-Cheks stable, adjust dose of Lantus after surgery.   #Chronic kidney disease stage 3: Baseline creatinine around 1.4, , creatinine currently at baseline Holding Lasix as he looks euvolemic.  #Hyperlipidemia: Continue Zocor.    DVT prophylaxsis Lovenox  Code Status:  Full code   Family Communication: Discussed in detail with the patient, all imaging results, lab results explained to the patient   Disposition Plan:  Likely will need SNF      Consultants:  Orthopedics  Procedures:  None  Antibiotics: Anti-infectives    None         HPI/Subjective: Pain in right hip  Objective: Vitals:   04/06/17 1800 04/06/17 1900 04/06/17  1953 04/07/17 0427  BP: 106/62 (!) 122/58 129/65 139/75  Pulse:  66 73 65  Resp: 17 20 18 18   Temp:  97.7 F (36.5 C) 98.3 F (36.8 C) 97.9 F (36.6 C)  TempSrc:  Oral Oral Oral  SpO2:  96% 94% 91%    Intake/Output Summary (Last 24 hours) at 04/07/17 7672 Last data filed at 04/07/17 0400  Gross per 24 hour  Intake              449 ml  Output              200 ml  Net              249 ml    Exam:  Examination:  General exam: Appears calm and comfortable  Respiratory system: Clear to auscultation. Respiratory effort normal. Cardiovascular system: S1 & S2 heard, RRR. No JVD, murmurs, rubs, gallops or clicks. No pedal edema. Gastrointestinal system: Abdomen is nondistended, soft and nontender. No organomegaly or masses felt. Normal bowel sounds heard. Central nervous system: Alert and oriented. No focal neurological deficits. Extremities: Right leg but tender range of motion Skin: No rashes, lesions or ulcers Psychiatry: Judgement and insight appear normal. Mood & affect appropriate.     Data Reviewed: I have personally reviewed following labs and imaging studies  Micro Results Recent Results (from the past 240 hour(s))  Surgical PCR screen     Status: None   Collection Time: 04/06/17  8:40 PM  Result Value Ref Range Status   MRSA, PCR NEGATIVE NEGATIVE Final   Staphylococcus aureus NEGATIVE NEGATIVE Final    Comment:  The Xpert SA Assay (FDA approved for NASAL specimens in patients over 17 years of age), is one component of a comprehensive surveillance program.  Test performance has been validated by [facility] for patients greater than or equal to 26 year old. It is not intended to diagnose infection nor to guide or monitor treatment.     Radiology Reports Dg Chest 2 View  Result Date: 04/04/2017 CLINICAL DATA:  Shortness of breath. EXAM: CHEST  2 VIEW COMPARISON:  Two-view chest x-ray 12/16/2016 FINDINGS: The heart is mildly enlarged. Chronic  interstitial coarsening is stable. There is no edema or effusion. No focal airspace disease is present. Atherosclerotic calcifications are noted at the aortic arch. Degenerative changes of the thoracic spine are stable. IMPRESSION: 1. No acute cardiopulmonary disease or significant interval change. Electronically Signed   By: San Morelle M.D.   On: 04/04/2017 13:16   Dg Chest Port 1 View  Result Date: 04/06/2017 CLINICAL DATA:  Right hip fracture. No current chest complaints. History hypertension. EXAM: PORTABLE CHEST 1 VIEW COMPARISON:  04/04/2017 FINDINGS: Cardiac silhouette is borderline enlarged. No mediastinal or hilar masses. No evidence of adenopathy. Lungs are clear.  No convincing pleural effusion.  No pneumothorax. Skeletal structures are demineralized but grossly intact. IMPRESSION: No acute cardiopulmonary disease. Electronically Signed   By: Lajean Manes M.D.   On: 04/06/2017 15:47   Dg Hip Unilat  With Pelvis 2-3 Views Right  Result Date: 04/06/2017 CLINICAL DATA:  Right hip pain secondary to a fall today. EXAM: DG HIP (WITH OR WITHOUT PELVIS) 2-3V RIGHT COMPARISON:  None. FINDINGS: There is a comminuted intertrochanteric fracture of the proximal right femur with avulsion of the lesser trochanter. Pelvic bones are intact. Healed left intertrochanteric fracture with intramedullary nail and screw. IMPRESSION: Acute comminuted intertrochanteric fracture of the proximal right femur. Electronically Signed   By: Lorriane Shire M.D.   On: 04/06/2017 15:24   Dg Femur, Min 2 Views Right  Result Date: 04/04/2017 CLINICAL DATA:  Persistent pain after fall several weeks prior EXAM: RIGHT FEMUR 2 VIEWS COMPARISON:  None. FINDINGS: Frontal lateral views were obtained. There is no fracture or dislocation. No abnormal periosteal reaction. There is mild narrowing of the left hip joint. There is patellofemoral joint narrowing. There is chondrocalcinosis in the knee joint. There is extensive  vascular calcification in the superficial femoral and popliteal arteries. IMPRESSION: No fracture or dislocation.  No abnormal periosteal reaction. Osteoarthritic change in the hip and knee joints. No knee joint effusion. Chondrocalcinosis is noted in the knee joint, a finding that may be seen with osteoarthritis or with calcium pyrophosphate deposition disease. There is superficial femoral artery and popliteal artery atherosclerosis. Electronically Signed   By: Lowella Grip III M.D.   On: 04/04/2017 13:15     CBC  Recent Labs Lab 04/06/17 1636  WBC 10.9*  HGB 13.9  HCT 41.3  PLT 230  MCV 83.9  MCH 28.3  MCHC 33.7  RDW 14.0  LYMPHSABS 2.2  MONOABS 0.7  EOSABS 0.1  BASOSABS 0.1    Chemistries   Recent Labs Lab 04/06/17 1636  NA 138  K 4.1  CL 108  CO2 20*  GLUCOSE 147*  BUN 26*  CREATININE 1.54*  CALCIUM 9.0   ------------------------------------------------------------------------------------------------------------------ CrCl cannot be calculated (Unknown ideal weight.). ------------------------------------------------------------------------------------------------------------------ No results for input(s): HGBA1C in the last 72 hours. ------------------------------------------------------------------------------------------------------------------ No results for input(s): CHOL, HDL, LDLCALC, TRIG, CHOLHDL, LDLDIRECT in the last 72 hours. ------------------------------------------------------------------------------------------------------------------ No results for input(s): TSH, T4TOTAL,  T3FREE, THYROIDAB in the last 72 hours.  Invalid input(s): FREET3 ------------------------------------------------------------------------------------------------------------------ No results for input(s): VITAMINB12, FOLATE, FERRITIN, TIBC, IRON, RETICCTPCT in the last 72 hours.  Coagulation profile  Recent Labs Lab 04/06/17 1636  INR 0.96    No results for input(s):  DDIMER in the last 72 hours.  Cardiac Enzymes No results for input(s): CKMB, TROPONINI, MYOGLOBIN in the last 168 hours.  Invalid input(s): CK ------------------------------------------------------------------------------------------------------------------ Invalid input(s): POCBNP   CBG:  Recent Labs Lab 04/06/17 2049 04/07/17 0651  GLUCAP 170* 170*       Studies: Dg Chest Port 1 View  Result Date: 04/06/2017 CLINICAL DATA:  Right hip fracture. No current chest complaints. History hypertension. EXAM: PORTABLE CHEST 1 VIEW COMPARISON:  04/04/2017 FINDINGS: Cardiac silhouette is borderline enlarged. No mediastinal or hilar masses. No evidence of adenopathy. Lungs are clear.  No convincing pleural effusion.  No pneumothorax. Skeletal structures are demineralized but grossly intact. IMPRESSION: No acute cardiopulmonary disease. Electronically Signed   By: Lajean Manes M.D.   On: 04/06/2017 15:47   Dg Hip Unilat  With Pelvis 2-3 Views Right  Result Date: 04/06/2017 CLINICAL DATA:  Right hip pain secondary to a fall today. EXAM: DG HIP (WITH OR WITHOUT PELVIS) 2-3V RIGHT COMPARISON:  None. FINDINGS: There is a comminuted intertrochanteric fracture of the proximal right femur with avulsion of the lesser trochanter. Pelvic bones are intact. Healed left intertrochanteric fracture with intramedullary nail and screw. IMPRESSION: Acute comminuted intertrochanteric fracture of the proximal right femur. Electronically Signed   By: Lorriane Shire M.D.   On: 04/06/2017 15:24      Lab Results  Component Value Date   HGBA1C (H) 09/19/2010    5.7 (NOTE)                                                                       According to the ADA Clinical Practice Recommendations for 2011, when HbA1c is used as a screening test:   >=6.5%   Diagnostic of Diabetes Mellitus           (if abnormal result  is confirmed)  5.7-6.4%   Increased risk of developing Diabetes Mellitus  References:Diagnosis and  Classification of Diabetes Mellitus,Diabetes VFIE,3329,51(OACZY 1):S62-S69 and Standards of Medical Care in         Diabetes - 2011,Diabetes SAYT,0160,10  (Suppl 1):S11-S61.   Lab Results  Component Value Date   CREATININE 1.54 (H) 04/06/2017       Scheduled Meds: . docusate sodium  100 mg Oral BID  . enoxaparin (LOVENOX) injection  30 mg Subcutaneous Q24H  . gabapentin  300 mg Oral BID  . insulin aspart  0-15 Units Subcutaneous TID WC  . insulin glargine  15 Units Subcutaneous QHS  . metoprolol succinate  25 mg Oral Daily  . multivitamin with minerals  1 tablet Oral Daily  . pantoprazole  40 mg Oral BID AC  . simvastatin  20 mg Oral QHS  . traZODone  150 mg Oral QHS   Continuous Infusions: . sodium chloride 50 mL/hr at 04/07/17 0400     LOS: 1 day    Time spent: >30 MINS    Reyne Dumas  [facility] Pager [phone]. If 7PM-7AM, please contact night-coverage at [email or web address],  password [facility] 04/07/2017, 9:21 AM  LOS: 1 day

## 2017-04-07 NOTE — Anesthesia Preprocedure Evaluation (Addendum)
Anesthesia Evaluation  Patient identified by MRN, date of birth, ID band Patient awake    Reviewed: Allergy & Precautions, H&P , NPO status , Patient's Chart, lab work & pertinent test results, reviewed documented beta blocker date and time   Airway Mallampati: III  TM Distance: >3 FB Neck ROM: Full    Dental no notable dental hx. (+) Teeth Intact, Dental Advisory Given   Pulmonary Current Smoker,    Pulmonary exam normal breath sounds clear to auscultation       Cardiovascular hypertension, Pt. on medications and Pt. on home beta blockers + Peripheral Vascular Disease   Rhythm:Regular Rate:Normal     Neuro/Psych negative neurological ROS  negative psych ROS   GI/Hepatic Neg liver ROS, GERD  Medicated and Controlled,  Endo/Other  diabetes, Insulin Dependent  Renal/GU Renal InsufficiencyRenal disease  negative genitourinary   Musculoskeletal   Abdominal   Peds  Hematology negative hematology ROS (+) anemia ,   Anesthesia Other Findings   Reproductive/Obstetrics negative OB ROS                            Anesthesia Physical Anesthesia Plan  ASA: III  Anesthesia Plan: General   Post-op Pain Management:    Induction: Intravenous  PONV Risk Score and Plan: 2 and Ondansetron, Propofol and Treatment may vary due to age or medical condition  Airway Management Planned: Oral ETT  Additional Equipment:   Intra-op Plan:   Post-operative Plan: Extubation in OR  Informed Consent: I have reviewed the patients History and Physical, chart, labs and discussed the procedure including the risks, benefits and alternatives for the proposed anesthesia with the patient or authorized representative who has indicated his/her understanding and acceptance.   Dental advisory given  Plan Discussed with: CRNA  Anesthesia Plan Comments:         Anesthesia Quick Evaluation

## 2017-04-07 NOTE — Transfer of Care (Signed)
Immediate Anesthesia Transfer of Care Note  Patient: Preston Moore  Procedure(s) Performed: Procedure(s): RIGHT HIP INTRATROCHANTERIC NAILING (Right)  Patient Location: PACU  Anesthesia Type:General  Level of Consciousness:  sedated, patient cooperative and responds to stimulation  Airway & Oxygen Therapy:Patient Spontanous Breathing and Patient connected to face mask oxgen  Post-op Assessment:  Report given to PACU RN and Post -op Vital signs reviewed and stable  Post vital signs:  Reviewed and stable  Last Vitals:  Vitals:   04/06/17 1953 04/07/17 0427  BP: 129/65 139/75  Pulse: 73 65  Resp: 18 18  Temp: 36.8 C 83.4 C    Complications: No apparent anesthesia complications

## 2017-04-07 NOTE — Discharge Instructions (Signed)
Discharge Instructions after Hip Surgery   Touchdown weight bearing only Use ice on the hip intermittently over the first 48 hours after surgery.  Pain medicine has been prescribed for you.  Use your medicine liberally over the first 48 hours, and then you can begin to taper your use. You may take Extra Strength Tylenol or Tylenol only in place of the pain pills. DO NOT take ANY nonsteroidal anti-inflammatory pain medications: Advil, Motrin, Ibuprofen, Aleve, Naproxen or Naprosyn.  Take aspirin or anticoagulant medication as prescribed for 2 weeks after surgery. Please notify if allergic or sensitivity to aspirin. You may remove your dressing after two days.  You may shower 5 days after surgery. The incisions CANNOT get wet prior to 5 days. Simply allow the water to wash over the site and then pat dry. Do not rub the incisions.    Please call [phone] during normal business hours or [phone] after hours for any problems. Including the following:  - excessive redness of the incisions - drainage for more than 4 days - fever of more than 101.5 F  *Please note that pain medications will not be refilled after hours or on weekends.

## 2017-04-07 NOTE — Interval H&P Note (Signed)
History and Physical Interval Note:  04/07/2017 1:01 PM  Preston Moore  has presented today for surgery, with the diagnosis of RIGHT HIP FRACTURE  The various methods of treatment have been discussed with the patient and family. After consideration of risks, benefits and other options for treatment, the patient has consented to  Procedure(s): RIGHT HIP INTRATROCHANTERIC NAILING (Right) as a surgical intervention .  The patient's history has been reviewed, patient examined, no change in status, stable for surgery.  I have reviewed the patient's chart and labs.  Questions were answered to the patient's satisfaction.     Mariska Daffin G

## 2017-04-07 NOTE — Op Note (Signed)
THORNTON DOHRMANN [id number] 04/07/2017   PRE-OP DIAGNOSIS: right hip intertroch fx  POST-OP DIAGNOSIS: same  PROCEDURE: right hip troch nail  ANESTHESIA: general  Azadeh Hyder G   Dictation #:  [id number]

## 2017-04-07 NOTE — Anesthesia Postprocedure Evaluation (Signed)
Anesthesia Post Note  Patient: Preston Moore  Procedure(s) Performed: Procedure(s) (LRB): RIGHT HIP INTRATROCHANTERIC NAILING (Right)     Patient location during evaluation: PACU Anesthesia Type: General Level of consciousness: awake and alert Pain management: pain level controlled Vital Signs Assessment: post-procedure vital signs reviewed and stable Respiratory status: spontaneous breathing, nonlabored ventilation, respiratory function stable and patient connected to nasal cannula oxygen Cardiovascular status: blood pressure returned to baseline and stable Postop Assessment: no signs of nausea or vomiting Anesthetic complications: no    Last Vitals:  Vitals:   04/07/17 1659 04/07/17 1717  BP:  (!) 107/48  Pulse:  91  Resp:  14  Temp: 36.2 C 36.6 C    Last Pain:  Vitals:   04/07/17 1717  TempSrc: Oral  PainSc:                  Doryan Bahl,W. EDMOND

## 2017-04-07 NOTE — H&P (View-Only) (Signed)
Preston Nakayama, MD  [name], PA-C  [name], PA-C                                  Preston Moore/SOS                [address] [place], Preston Moore  [address]   Preston Moore Preston Moore            MRN:  [id number] DOB/SEX:  Aug 06, 1934/male     CHIEF COMPLAINT:  Painful right hip  HISTORY: Preston Moore Preston 83 y.o. male with Preston fall at home today.  Seen in ED and diagnosed with hip fracture.  Admitted to medicine and ORS consulted for management of hip.  Denies LOC and other pains.  Has history of opposite hip fracture Preston few years back.   PAST MEDICAL HISTORY: Patient Active Problem List   Diagnosis Date Noted  . Closed right hip fracture, initial encounter ([place]) 04/06/2017  . Hip fracture ([place]) 04/06/2017  . CKD (chronic kidney disease), stage III   . Essential hypertension   . Type 2 diabetes mellitus with hyperlipidemia ([place])   . Ileus ([place]) 02/29/2012  . Malnutrition ([place]) 02/29/2012  . Obesity (BMI 30.0-34.9) 02/29/2012  . Non compliance with medical treatment 02/29/2012  . SBO (small bowel obstruction) ([place]) 02/23/2012  . Pancreatitis 02/22/2012  . Physical deconditioning 02/22/2012  . B12 deficiency 02/22/2012  . Esophageal reflux disease 02/22/2012  . Acute renal failure ([place]) 02/18/2012  . Hyperkalemia 02/18/2012  . ARF (acute renal failure) ([place]) 02/18/2012  . Nausea and vomiting 02/18/2012  . Diarrhea 02/18/2012  . Iron deficiency anemia 02/18/2012  . Lactic acidosis 02/18/2012  . Dyspnea 06/08/2011  . ANEMIA, IRON DEFICIENCY 11/01/2010  . ISCHEMIC CARDIOMYOPATHY 11/01/2010  . ATRIAL arrhythmia 11/01/2010  . UNSPECIFIED PERIPHERAL VASCULAR DISEASE 10/05/2010   Past Medical History:  Diagnosis Date  . Atrial flutter ([place])   . Diabetes mellitus   . Hyperlipidemia   . Hypertension   . Ileus ([place]) 02/29/2012  . Malnutrition ([place]) 02/29/2012  . Neuropathy   . Non compliance with medical treatment 02/29/2012  . Syncope and  collapse    Past Surgical History:  Procedure Laterality Date  . BACK SURGERY    . LAMINECTOMY  1965     MEDICATIONS:   Current Facility-Administered Medications:  .  0.9 %  sodium chloride infusion, , Intravenous, Continuous, Rosita Fire, MD, Last Rate: 50 mL/hr at 04/06/17 1856 .  acetaminophen (TYLENOL) tablet 1,000 mg, 1,000 mg, Oral, Q6H PRN, Rosita Fire, MD .  bisacodyl (DULCOLAX) EC tablet 5 mg, 5 mg, Oral, Daily PRN, Rosita Fire, MD .  docusate sodium (COLACE) capsule 100 mg, 100 mg, Oral, BID, Rosita Fire, MD .  enoxaparin (LOVENOX) injection 30 mg, 30 mg, Subcutaneous, Q24H, Bhandari, Osie Bond, MD .  gabapentin (NEURONTIN) capsule 300 mg, 300 mg, Oral, BID, Rosita Fire, MD .  HYDROcodone-acetaminophen (NORCO/VICODIN) 5-325 MG per tablet 1-2 tablet, 1-2 tablet, Oral, Q6H PRN, Rosita Fire, MD, 2 tablet at 04/06/17 1855 .  HYDROmorphone (DILAUDID) injection 0.5 mg, 0.5 mg, Intravenous, Q2H PRN, Rosita Fire, MD .  Derrill Memo ON 04/07/2017] insulin aspart (novoLOG) injection 0-15 Units, 0-15 Units, Subcutaneous, TID WC, Bhandari, Osie Bond, MD .  insulin glargine (LANTUS) injection 15 Units, 15 Units, Subcutaneous, QHS, Rosita Fire, MD .  Derrill Memo ON 04/07/2017] metoprolol succinate (TOPROL-XL)  24 hr tablet 25 mg, 25 mg, Oral, Daily, Rosita Fire, MD .  Derrill Memo ON 04/07/2017] multivitamin with minerals tablet 1 tablet, 1 tablet, Oral, Daily, Rosita Fire, MD .  pantoprazole (PROTONIX) EC tablet 40 mg, 40 mg, Oral, BID AC, Rosita Fire, MD .  simvastatin (ZOCOR) tablet 20 mg, 20 mg, Oral, QHS, Rosita Fire, MD .  traZODone (DESYREL) tablet 150 mg, 150 mg, Oral, QHS, Carolin Sicks, Osie Bond, MD  ALLERGIES:  No Known Allergies  REVIEW OF SYSTEMS: REVIEWED IN DETAIL IN CHART  FAMILY HISTORY:   Family History  Problem Relation Age of Onset  . Hypertension Neg Hx   . Coronary  artery disease Neg Hx   . Diabetes Neg Hx     SOCIAL HISTORY:   Social History  Substance Use Topics  . Smoking status: Current Every Day Smoker    Packs/day: 0.50    Years: 10.00    Types: Cigarettes  . Smokeless tobacco: Never Used  . Alcohol use 21.6 oz/week    36 Cans of beer per week     EXAMINATION: Vital signs in last 24 hours: Temp:  [97.6 F (36.4 C)-97.7 F (36.5 C)] 97.7 F (36.5 C) (06/22 1900) Pulse Rate:  [63-71] 66 (06/22 1900) Resp:  [16-20] 20 (06/22 1900) BP: (105-122)/(55-77) 122/58 (06/22 1900) SpO2:  [94 %-100 %] 96 % (06/22 1900)  BP (!) 122/58   Pulse 66   Temp 97.7 F (36.5 C) (Oral)   Resp 20   SpO2 96%   General Appearance:    Alert, cooperative, no distress, appears stated age  Head:    Normocephalic, without obvious abnormality, atraumatic  Eyes:    PERRL, conjunctiva/corneas clear, EOM's intact, fundi    benign, both eyes       Ears:    Normal TM's and external ear canals, both ears  Nose:   Nares normal, septum midline, mucosa normal, no drainage    or sinus tenderness  Throat:   Lips, mucosa, and tongue normal; teeth and gums normal  Neck:   Supple, symmetrical, trachea midline, no adenopathy;       thyroid:  No enlargement/tenderness/nodules; no carotid   bruit or JVD  Back:     Symmetric, no curvature, ROM normal, no CVA tenderness  Lungs:     Clear to auscultation bilaterally, respirations unlabored  Chest wall:    No tenderness or deformity  Heart:    Regular rate and rhythm, S1 and S2 normal, no murmur, rub   or gallop  Abdomen:     Soft, non-tender, bowel sounds active all four quadrants,    no masses, no organomegaly  Genitalia:    Rectal:    Extremities:   Extremities normal, atraumatic, no cyanosis or edema  Pulses:   2+ and symmetric all extremities  Skin:   Skin color, texture, turgor normal, no rashes or lesions  Lymph nodes:   Cervical, supraclavicular, and axillary nodes normal  Neurologic:   CNII-XII intact. Normal  strength, sensation and reflexes      throughout    Musculoskeletal Exam:   Right leg tender to ROM.  Other 3 extremities with FROM   DIAGNOSTIC STUDIES: Recent laboratory studies:  Recent Labs  04/06/17 1636  WBC 10.9*  HGB 13.9  HCT 41.3  PLT 230    Recent Labs  04/06/17 1636  NA 138  K 4.1  CL 108  CO2 20*  BUN 26*  CREATININE 1.54*  GLUCOSE 147*  CALCIUM  9.0   Lab Results  Component Value Date   INR 0.96 04/06/2017   INR 0.93 02/19/2012   INR 1.06 09/17/2010     Recent Radiographic Studies :  Dg Chest 2 View  Result Date: 04/04/2017 CLINICAL DATA:  Shortness of breath. EXAM: CHEST  2 VIEW COMPARISON:  Two-view chest x-ray 12/16/2016 FINDINGS: The heart is mildly enlarged. Chronic interstitial coarsening is stable. There is no edema or effusion. No focal airspace disease is present. Atherosclerotic calcifications are noted at the aortic arch. Degenerative changes of the thoracic spine are stable. IMPRESSION: 1. No acute cardiopulmonary disease or significant interval change. Electronically Signed   By: San Morelle M.D.   On: 04/04/2017 13:16   Dg Chest Port 1 View  Result Date: 04/06/2017 CLINICAL DATA:  Right hip fracture. No current chest complaints. History hypertension. EXAM: PORTABLE CHEST 1 VIEW COMPARISON:  04/04/2017 FINDINGS: Cardiac silhouette is borderline enlarged. No mediastinal or hilar masses. No evidence of adenopathy. Lungs are clear.  No convincing pleural effusion.  No pneumothorax. Skeletal structures are demineralized but grossly intact. IMPRESSION: No acute cardiopulmonary disease. Electronically Signed   By: Lajean Manes M.D.   On: 04/06/2017 15:47   Dg Hip Unilat  With Pelvis 2-3 Views Right  Result Date: 04/06/2017 CLINICAL DATA:  Right hip pain secondary to Preston fall today. EXAM: DG HIP (WITH OR WITHOUT PELVIS) 2-3V RIGHT COMPARISON:  None. FINDINGS: There is Preston comminuted intertrochanteric fracture of the proximal right femur with  avulsion of the lesser trochanter. Pelvic bones are intact. Healed left intertrochanteric fracture with intramedullary nail and screw. IMPRESSION: Acute comminuted intertrochanteric fracture of the proximal right femur. Electronically Signed   By: Lorriane Shire M.D.   On: 04/06/2017 15:24   Dg Femur, Min 2 Views Right  Result Date: 04/04/2017 CLINICAL DATA:  Persistent pain after fall several weeks prior EXAM: RIGHT FEMUR 2 VIEWS COMPARISON:  None. FINDINGS: Frontal lateral views were obtained. There is no fracture or dislocation. No abnormal periosteal reaction. There is mild narrowing of the left hip joint. There is patellofemoral joint narrowing. There is chondrocalcinosis in the knee joint. There is extensive vascular calcification in the superficial femoral and popliteal arteries. IMPRESSION: No fracture or dislocation.  No abnormal periosteal reaction. Osteoarthritic change in the hip and knee joints. No knee joint effusion. Chondrocalcinosis is noted in the knee joint, Preston finding that may be seen with osteoarthritis or with calcium pyrophosphate deposition disease. There is superficial femoral artery and popliteal artery atherosclerosis. Electronically Signed   By: Lowella Grip III M.D.   On: 04/04/2017 13:15    ASSESSMENT:  Right hip intertrochanteric fracture   PLAN:  Needs ORIF in hopes of sitting, standing, and potentially walking again.  Appreciate medical team help.  Plan on ORIF with troch nail tomorrow early afternoon.  Discussed risks of anesthesia, infection, and death. Will likely need rehab stay postop.  Ingri Diemer G 04/06/2017, 7:40 PM

## 2017-04-08 DIAGNOSIS — S72009A Fracture of unspecified part of neck of unspecified femur, initial encounter for closed fracture: Secondary | ICD-10-CM

## 2017-04-08 LAB — COMPREHENSIVE METABOLIC PANEL
ALK PHOS: 60 U/L (ref 38–126)
ALT: 10 U/L — AB (ref 17–63)
ANION GAP: 6 (ref 5–15)
AST: 15 U/L (ref 15–41)
Albumin: 3.2 g/dL — ABNORMAL LOW (ref 3.5–5.0)
BUN: 16 mg/dL (ref 6–20)
CALCIUM: 7.8 mg/dL — AB (ref 8.9–10.3)
CO2: 22 mmol/L (ref 22–32)
CREATININE: 1.43 mg/dL — AB (ref 0.61–1.24)
Chloride: 107 mmol/L (ref 101–111)
GFR, EST AFRICAN AMERICAN: 52 mL/min — AB (ref >60)
GFR, EST NON AFRICAN AMERICAN: 45 mL/min — AB (ref >60)
Glucose, Bld: 211 mg/dL — ABNORMAL HIGH (ref 65–99)
Potassium: 4.4 mmol/L (ref 3.5–5.1)
SODIUM: 135 mmol/L (ref 135–145)
Total Bilirubin: 0.7 mg/dL (ref 0.3–1.2)
Total Protein: 5.6 g/dL — ABNORMAL LOW (ref 6.5–8.1)

## 2017-04-08 LAB — GLUCOSE, CAPILLARY
GLUCOSE-CAPILLARY: 195 mg/dL — AB (ref 65–99)
GLUCOSE-CAPILLARY: 219 mg/dL — AB (ref 65–99)
Glucose-Capillary: 186 mg/dL — ABNORMAL HIGH (ref 65–99)
Glucose-Capillary: 232 mg/dL — ABNORMAL HIGH (ref 65–99)

## 2017-04-08 LAB — CBC
HCT: 31.4 % — ABNORMAL LOW (ref 39.0–52.0)
HEMOGLOBIN: 10.2 g/dL — AB (ref 13.0–17.0)
MCH: 27.9 pg (ref 26.0–34.0)
MCHC: 32.5 g/dL (ref 30.0–36.0)
MCV: 86 fL (ref 78.0–100.0)
PLATELETS: 166 10*3/uL (ref 150–400)
RBC: 3.65 MIL/uL — AB (ref 4.22–5.81)
RDW: 14 % (ref 11.5–15.5)
WBC: 8.8 10*3/uL (ref 4.0–10.5)

## 2017-04-08 MED ORDER — ASPIRIN 325 MG PO TBEC: 325.0000 mg | DELAYED_RELEASE_TABLET | Freq: Two times a day (BID) | ORAL | 0 refills | Status: AC

## 2017-04-08 MED ORDER — HYDROCODONE-ACETAMINOPHEN 5-325 MG PO TABS: 1.0000 | ORAL_TABLET | Freq: Four times a day (QID) | ORAL | 0 refills | Status: DC | PRN

## 2017-04-08 NOTE — Progress Notes (Signed)
Physical Therapy Treatment Patient Details Name: Preston Moore MRN: [id number] DOB: 1934/01/19 Today's Date: 04/08/2017    History of Present Illness 83 y.o.malewith medical history significant of atrial flutter not on anticoagulation, currently on sinus rhythm, DM, HTN, neuropathy, presented after a fall sustaining a right hip fracture. As per patient and his daughter, the family were hiding the patient's cigarettes on the top of the refrigerator. Patient used bucket upside down tried to reach the cigaretteon the top of refrigerator when he tripped and fell sustaining a right hip pain. Pt s/p ORIF 04/07/17.     PT Comments    Patient seen in conjunction with OT while family present and he was more awake. He was able to move to sit EOB and perform ADL tasks with OT. Discharge plan confirmed with family.    Follow Up Recommendations  SNF;Supervision/Assistance - 24 hour (Pt and daughters agree)     Equipment Recommendations  None recommended by PT    Recommendations for Other Services       Precautions / Restrictions Precautions Precautions: Fall Restrictions Weight Bearing Restrictions: Yes RLE Weight Bearing: Touchdown weight bearing    Mobility  Bed Mobility Overal bed mobility: Needs Assistance Bed Mobility: Supine to Sit;Sit to Supine     Supine to sit: Max assist;+2 for physical assistance;+2 for safety/equipment Sit to supine: Max assist;+2 for physical assistance;+2 for safety/equipment   General bed mobility comments: Pt requires Max A +2 and Max VCs to sequence task. Pt generally weak and limited by pain.   Transfers                 General transfer comment: Pt too lethargic to attempt and maintain WB precautions.   Ambulation/Gait                 Stairs            Wheelchair Mobility    Modified Rankin (Stroke Patients Only)       Balance Overall balance assessment: Needs assistance Sitting-balance support: Feet  supported;Single extremity supported Sitting balance-Leahy Scale: Fair Sitting balance - Comments: Able to sit EOB to UB bathing. No dynamic sitting movements.                                    Cognition Arousal/Alertness: Lethargic;Suspect due to medications Behavior During Therapy: [facility] for tasks assessed/performed Overall Cognitive Status: Impaired/Different from baseline Area of Impairment: Following commands;Attention;Safety/judgement;Problem solving;Awareness                   Current Attention Level: Sustained   Following Commands: Follows one step commands inconsistently;Follows one step commands with increased time Safety/Judgement: Decreased awareness of deficits;Decreased awareness of safety Awareness: Emergent Problem Solving: Slow processing;Decreased initiation;Requires verbal cues;Requires tactile cues General Comments: Pt was lethargic and would answer all questions with "I don't know."      Exercises      General Comments General comments (skin integrity, edema, etc.): Pt's daughters present and agreed pt will need SNF      Pertinent Vitals/Pain Pain Assessment: Faces Faces Pain Scale: Hurts even more Pain Location: Rt hip Pain Descriptors / Indicators: Operative site guarding Pain Intervention(s): Limited activity within patient's tolerance;Monitored during session;Repositioned    Home Living Family/patient expects to be discharged to:: Private residence [facility] home) Living Arrangements: Alone Available Help at Discharge: Family;Personal care attendant;Available PRN/intermittently Type of Home: House Home Access: Stairs to  enter   Home Layout: One level Home Equipment: [place] - 4 wheels;Wheelchair - manual;Shower seat;Grab bars - tub/shower;Hand held shower head Additional Comments: pt has attendant that comes 2-3 hrs in the mornings to help him get breakfast, bathed and dressed. Daughters rotate coming over in the evenings to assist  pt. He is alone the remaining hours.     Prior Function Level of Independence: Needs assistance  Gait / Transfers Assistance Needed: modified independent with rollator or wheelchair inside home ADL's / Homemaking Assistance Needed: Daughter reports that pt performs his dressing and bathing. However is very weak. Aide will come in and assist with dressing and bathing as needed. Family does medication management, driving, and all other IADLs.      PT Goals (current goals can now be found in the care plan section) Acute Rehab PT Goals Patient Stated Goal: to stop hurting Time For Goal Achievement: 04/22/17 Progress towards PT goals: Progressing toward goals (was able to sit EOB with +2)    Frequency    Min 3X/week      PT Plan Current plan remains appropriate;Frequency needs to be updated    Co-evaluation PT/OT/SLP Co-Evaluation/Treatment: Yes Reason for Co-Treatment: For patient/therapist safety;To address functional/ADL transfers PT goals addressed during session: Mobility/safety with mobility        AM-PAC PT "6 Clicks" Daily Activity  Outcome Measure  Difficulty turning over in bed (including adjusting bedclothes, sheets and blankets)?: Total Difficulty moving from lying on back to sitting on the side of the bed? : Total Difficulty sitting down on and standing up from a chair with arms (e.g., wheelchair, bedside commode, etc,.)?: Total Help needed moving to and from a bed to chair (including a wheelchair)?: Total Help needed walking in hospital room?: Total Help needed climbing 3-5 steps with a railing? : Total 6 Click Score: 6    End of Session   Activity Tolerance: Patient limited by lethargy Patient left: in bed;with call bell/phone within reach;with bed alarm set;with nursing/sitter in room;with family/visitor present;with SCD's reapplied   PT Visit Diagnosis: Muscle weakness (generalized) (M62.81);History of falling (Z91.81);Pain;Difficulty in walking, not  elsewhere classified (R26.2) Pain - Right/Left: Right Pain - part of body: Leg     Time: 6384-5364 PT Time Calculation (min) (ACUTE ONLY): 27 min  Charges:  $Therapeutic Activity: 8-22 mins                    G Codes:          [employer], PT 04/08/2017, 5:30 PM

## 2017-04-08 NOTE — Evaluation (Signed)
Physical Therapy Evaluation Patient Details Name: Preston Moore MRN: [id number] DOB: [birth year] Today's Date: 04/08/2017   History of Present Illness  83 y.o.malewith medical history significant of atrial flutter not on anticoagulation, currently on sinus rhythm, DM, HTN, neuropathy, presented after a fall sustaining a right hip fracture. As per patient and his daughter, the family were hiding the patient's cigarettes on the top of the refrigerator. Patient used bucket upside down tried to reach the cigaretteon the top of refrigerator when he tripped and fell sustaining a right hip pain. Pt s/p ORIF 04/07/17.     Clinical Impression  Patient is s/p above surgery resulting in functional limitations due to the deficits listed below (see PT Problem List). Patient seen after pre-medicated for pain and very lethargic, only staying awake when constantly stimulated. Home caregiver arrived at end of session to provide some information re: prior status. He does not currently have 24 hour assistance at home.  Patient will benefit from skilled PT to increase their independence and safety with mobility to allow discharge to the venue listed below.       Follow Up Recommendations SNF;Supervision/Assistance - 24 hour (pt too lethargic to discuss)    Equipment Recommendations  None recommended by PT    Recommendations for Other Services OT consult (Social Work consult if pt/family agree to SNF)     Precautions / Restrictions Precautions Precautions: Fall Restrictions Weight Bearing Restrictions: Yes RLE Weight Bearing: Touchdown weight bearing      Mobility  Bed Mobility Overal bed mobility: Needs Assistance             General bed mobility comments: attempted to have pt laterally scoot his hips x 3 with very poor effort and total assist  Transfers                 General transfer comment: TBA; pt too lethargic to attempt with one person assist  Ambulation/Gait             General Gait Details: TBA; pt too lethargic to attempt with one person assist  Stairs            Wheelchair Mobility    Modified Rankin (Stroke Patients Only)       Balance Overall balance assessment:  (TBA)                                           Pertinent Vitals/Pain Pain Assessment: 0-10 Pain Score: 5  Pain Location: Rt hip Pain Descriptors / Indicators: Operative site guarding Pain Intervention(s): Monitored during session;Limited activity within patient's tolerance;Premedicated before session    Home Living Family/patient expects to be discharged to:: Unsure Living Arrangements: Alone Available Help at Discharge: Family;Personal care attendant;Available PRN/intermittently           Home Equipment: Walker - 4 wheels Additional Comments: pt has attendant that comes 2-3 hrs in the mornings to help him get breakfast, bathed and dressed. Daughters rotate coming over in the evenings to assist pt. He is alone the remaining hours.     Prior Function Level of Independence: Needs assistance   Gait / Transfers Assistance Needed: modified independent with rollator or wheelchair inside home           Hand Dominance        Extremity/Trunk Assessment   Upper Extremity Assessment Upper Extremity Assessment: Defer to OT evaluation  Lower Extremity Assessment Lower Extremity Assessment: Generalized weakness;RLE deficits/detail;LLE deficits/detail RLE Deficits / Details: PROM to [facility] with hip flexion to 90 only tolerated, knee flexion to 90 only (due to pain); ankle WFL RLE: Unable to fully assess due to pain RLE Sensation: history of peripheral neuropathy LLE Deficits / Details: AAROM due to lethargy; grossly 4/5 LLE Sensation: history of peripheral neuropathy       Communication   Communication: No difficulties  Cognition Arousal/Alertness: Lethargic;Suspect due to medications Behavior During Therapy: [facility] for tasks  assessed/performed Overall Cognitive Status: Difficult to assess                                 General Comments: caregiver arrived during session and provided some information re: prior functional status      General Comments      Exercises General Exercises - Lower Extremity Ankle Circles/Pumps: AROM;AAROM;Both;5 reps Heel Slides: PROM;AAROM;Both;10 reps;Supine Hip ABduction/ADduction: PROM;AAROM;Both;5 reps;Supine   Assessment/Plan    PT Assessment Patient needs continued PT services  PT Problem List Decreased strength;Decreased range of motion;Decreased activity tolerance;Decreased balance;Decreased mobility;Decreased knowledge of use of DME;Decreased knowledge of precautions;Impaired sensation;Obesity;Pain       PT Treatment Interventions DME instruction;Gait training;Stair training;Functional mobility training;Therapeutic activities;Therapeutic exercise;Balance training;Cognitive remediation;Patient/family education    PT Goals (Current goals can be found in the Care Plan section)  Acute Rehab PT Goals Patient Stated Goal: to stop hurting PT Goal Formulation: With patient Time For Goal Achievement: 04/22/17 Potential to Achieve Goals: Fair    Frequency Min 5X/week   Barriers to discharge Decreased caregiver support      Co-evaluation               AM-PAC PT "6 Clicks" Daily Activity  Outcome Measure Difficulty turning over in bed (including adjusting bedclothes, sheets and blankets)?: Total Difficulty moving from lying on back to sitting on the side of the bed? : Total Difficulty sitting down on and standing up from a chair with arms (e.g., wheelchair, bedside commode, etc,.)?: Total Help needed moving to and from a bed to chair (including a wheelchair)?: Total Help needed walking in hospital room?: Total Help needed climbing 3-5 steps with a railing? : Total 6 Click Score: 6    End of Session   Activity Tolerance: Patient limited by  lethargy Patient left: in bed;with call bell/phone within reach;with bed alarm set;with nursing/sitter in room;with family/visitor present;with SCD's reapplied Nurse Communication: Other (comment) (appears bed is wet from spilled urinal) PT Visit Diagnosis: Muscle weakness (generalized) (M62.81);History of falling (Z91.81);Pain;Difficulty in walking, not elsewhere classified (R26.2) Pain - Right/Left: Right Pain - part of body: Leg    Time: 1135-1206 PT Time Calculation (min) (ACUTE ONLY): 31 min   Charges:   PT Evaluation $PT Eval Low Complexity: 1 Procedure PT Treatments $Therapeutic Exercise: 8-22 mins   PT G Codes:          [employer], PT 04/08/2017, 12:45 PM

## 2017-04-08 NOTE — Evaluation (Signed)
Occupational Therapy Evaluation Patient Details Name: Preston Moore MRN: [id number] DOB: 1934-01-20 Today's Date: 04/08/2017    History of Present Illness 83 y.o.malewith medical history significant of atrial flutter not on anticoagulation, currently on sinus rhythm, DM, HTN, neuropathy, presented after a fall sustaining a right hip fracture. As per patient and his daughter, the family were hiding the patient's cigarettes on the top of the refrigerator. Patient used bucket upside down tried to reach the cigaretteon the top of refrigerator when he tripped and fell sustaining a right hip pain. Pt s/p ORIF 04/07/17.    Clinical Impression   PTA, pt was living alone and performing basic ADLs with assistance; family performed all IADLs including medication management. Daughter reports that pt mainly perform transfers to and from w/c. Pt currently is Max-total for LB ADLs and Max A+2 for bed mobility. Due to pt's lethargy and decreased understanding of WB status, session focused on ADLs at EOB and maintaining sitting balance. Feel pt would benefit from acute OT to increase occupational performance and facilitate safe dc. Recommend dc to SNF for further OT to increase safety and independence with ADLs and functional mobility.    Follow Up Recommendations  SNF;Supervision/Assistance - 24 hour    Equipment Recommendations  Other (comment) (Defer to next venue)    Recommendations for Other Services PT consult     Precautions / Restrictions Precautions Precautions: Fall Restrictions Weight Bearing Restrictions: Yes RLE Weight Bearing: Touchdown weight bearing      Mobility Bed Mobility Overal bed mobility: Needs Assistance Bed Mobility: Supine to Sit;Sit to Supine     Supine to sit: Max assist;+2 for physical assistance;+2 for safety/equipment Sit to supine: Max assist;+2 for physical assistance;+2 for safety/equipment   General bed mobility comments: Pt requires Max A +2 and Max VCs to  sequence task. Pt generally weak and limited by pain.   Transfers                 General transfer comment: Pt too lethargic to attempt and maintain WB precautions.     Balance Overall balance assessment: Needs assistance Sitting-balance support: Feet supported;Single extremity supported Sitting balance-Leahy Scale: Fair Sitting balance - Comments: Able to sit EOB to UB bathing. No dynamic sitting movements.                                   ADL either performed or assessed with clinical judgement   ADL Overall ADL's : Needs assistance/impaired Eating/Feeding: Set up;Sitting   Grooming: Set up;Sitting   Upper Body Bathing: Sitting;Cueing for sequencing;Moderate assistance Upper Body Bathing Details (indicate cue type and reason): Pt performed UB bathing with set up and Min A to wash back. Pt required VCs to intiate task and follow through.  Lower Body Bathing: Maximal assistance;Bed level   Upper Body Dressing : Sitting;Moderate assistance Upper Body Dressing Details (indicate cue type and reason): Donned new gown with Max VCs and Mod A at EOB Lower Body Dressing: Maximal assistance;Bed level                 General ADL Comments: Pt too lethargic to perform OOB activities at this time. Pt unable to maintain WB precaucations and requires Max A +2 for bed mobility. Pt performed UB bathing at EOB maintaining sitting balance ~10 min.      Vision         Perception     Praxis  Pertinent Vitals/Pain Pain Assessment: Faces Pain Score: 5  Faces Pain Scale: Hurts even more Pain Location: Rt hip Pain Descriptors / Indicators: Operative site guarding Pain Intervention(s): Monitored during session;Limited activity within patient's tolerance;Repositioned     Hand Dominance Right   Extremity/Trunk Assessment Upper Extremity Assessment Upper Extremity Assessment: Generalized weakness   Lower Extremity Assessment Lower Extremity Assessment:  Defer to PT evaluation;Generalized weakness RLE Deficits / Details: PROM to [facility] with hip flexion to 90 only tolerated, knee flexion to 90 only (due to pain); ankle WFL RLE: Unable to fully assess due to pain RLE Sensation: history of peripheral neuropathy LLE Deficits / Details: AAROM due to lethargy; grossly 4/5 LLE Sensation: history of peripheral neuropathy       Communication Communication Communication: No difficulties   Cognition Arousal/Alertness: Lethargic;Suspect due to medications Behavior During Therapy: [facility] for tasks assessed/performed Overall Cognitive Status: Impaired/Different from baseline Area of Impairment: Following commands;Attention;Safety/judgement;Problem solving;Awareness                   Current Attention Level: Sustained   Following Commands: Follows one step commands inconsistently;Follows one step commands with increased time Safety/Judgement: Decreased awareness of deficits;Decreased awareness of safety Awareness: Emergent Problem Solving: Slow processing;Decreased initiation;Requires verbal cues;Requires tactile cues General Comments: Pt was lethargic and would answer all questions with "I don't know."   General Comments  Pt's daughters present for session    Exercises    Shoulder Instructions      Home Living Family/patient expects to be discharged to:: Private residence [facility] home) Living Arrangements: Alone Available Help at Discharge: Family;Personal care attendant;Available PRN/intermittently Type of Home: House Home Access: Stairs to enter [employer] of Steps: 5   Home Layout: One level     Bathroom Shower/Tub: Occupational psychologist: Standard (Seldom uses) Bathroom Accessibility: Yes How Accessible: Accessible via wheelchair Home Equipment: [place] - 4 wheels;Wheelchair - manual;Shower seat;Grab bars - tub/shower;Hand held shower head   Additional Comments: pt has attendant that comes 2-3 hrs in the  mornings to help him get breakfast, bathed and dressed. Daughters rotate coming over in the evenings to assist pt. He is alone the remaining hours.       Prior Functioning/Environment Level of Independence: Needs assistance  Gait / Transfers Assistance Needed: modified independent with rollator or wheelchair inside home ADL's / Homemaking Assistance Needed: Daughter reports that pt performs his dressing and bathing. However is very weak. Aide will come in and assist with dressing and bathing as needed. Family does medication management, driving, and all other IADLs.             OT Problem List: Decreased strength;Decreased range of motion;Decreased activity tolerance;Impaired balance (sitting and/or standing);Decreased cognition;Decreased safety awareness;Decreased knowledge of use of DME or AE;Decreased knowledge of precautions;Pain      OT Treatment/Interventions: Self-care/ADL training;Therapeutic exercise;Energy conservation;DME and/or AE instruction;Therapeutic activities;Patient/family education    OT Goals(Current goals can be found in the care plan section) Acute Rehab OT Goals Patient Stated Goal: to stop hurting OT Goal Formulation: With patient Time For Goal Achievement: 04/22/17 Potential to Achieve Goals: Good ADL Goals Pt Will Perform Grooming: with set-up;with supervision;sitting Pt Will Perform Upper Body Dressing: with set-up;with supervision;sitting Pt Will Perform Lower Body Dressing: with adaptive equipment;sit to/from stand;with max assist;with caregiver independent in assisting Pt Will Transfer to Toilet: with max assist;stand pivot transfer;bedside commode Pt Will Perform Toileting - Clothing Manipulation and hygiene: with max assist;sit to/from stand  OT Frequency: Min 2X/week  Barriers to D/C: Decreased caregiver support  Lives alone       Co-evaluation              AM-PAC PT "6 Clicks" Daily Activity     Outcome Measure Help from another person  eating meals?: None Help from another person taking care of personal grooming?: A Little Help from another person toileting, which includes using toliet, bedpan, or urinal?: A Lot Help from another person bathing (including washing, rinsing, drying)?: A Lot Help from another person to put on and taking off regular upper body clothing?: A Lot Help from another person to put on and taking off regular lower body clothing?: A Lot 6 Click Score: 15   End of Session Equipment Utilized During Treatment: Oxygen (4L) Nurse Communication: Mobility status;Precautions;Weight bearing status  Activity Tolerance: Patient limited by fatigue;Patient limited by lethargy;Patient limited by pain Patient left: in bed;with call bell/phone within reach;with bed alarm set;with family/visitor present  OT Visit Diagnosis: Unsteadiness on feet (R26.81);Other abnormalities of gait and mobility (R26.89);Muscle weakness (generalized) (M62.81);Pain;History of falling (Z91.81) Pain - Right/Left: Right Pain - part of body: Hip;Leg                Time: 8916-9450 OT Time Calculation (min): 36 min Charges:  OT General Charges $OT Visit: 1 Procedure OT Evaluation $OT Eval Low Complexity: 1 Procedure G-Codes:     [place], OTR/L Acute Rehab Pager: [phone] Office: [place] 04/08/2017, 3:55 PM

## 2017-04-08 NOTE — Progress Notes (Signed)
Patient ID: Preston Moore, male   DOB: Feb 01, 1934, 83 y.o.   MRN: [id number]                                                                PROGRESS NOTE                                                                                                                                                                                                             Patient Demographics:    Preston Moore, is a 83 y.o. male, DOB - 26-May-1934, YIF:[id number]  Admit date - 04/06/2017   Admitting Physician Preston Tanna Furry, MD  Outpatient Primary MD for the patient is Preston Manes, MD  LOS - 2  Outpatient Specialists     Chief Complaint  Patient presents with  . Fall  . Hip Pain    right       Brief Narrative   83 y.o.malewith medical history significant of atrial flutter not on anticoagulation, currently on sinus rhythm, type 2 diabetes, hypertension, hyperlipidemia, neuropathy, presented after a fall sustaining a right hip pain and fracture.   Subjective:    Preston Moore today is doing well.  S/p POD #1  Right hip troch nail.  Pt requesting medication for pain control.  Denies cp, palp, sob.  , No headache,  No abdominal pain - No Nausea, No new weakness tingling or numbness, No Cough    Assessment  & Plan :    Active Problems:   Closed right hip fracture, initial encounter Preston Moore)   Hip fracture ([place])   Fall   Hip fracture requiring operative repair, s/p R hip trochanteric nail 6/23 Appreciate orthopedics input Preston Moore) PT/OT, appreciate input Social work consulted, will need rehab   Hypertension: Monitor blood pressure.  Continue metoprolol  Type 2 diabetes:  Accu-Cheks stable,  Continue Lantus   Chronic kidney disease stage3: Baseline creatinine around 1.4, , c Check cmp in am,  If creatinine good and bp good can resume lasix  Hyperlipidemia:  Continue Zocor.    DVT prophylaxsis Aspirin 325mg  po bid  Code Status:  Full code   Family Communication:  Discussed in detail with the patient, all imaging results, lab results explained to the patient  , Preston Moore his daughter present and I have requested social  work call her to go over their SNF options  Disposition Plan:  he lives alone , will need SNF       Consultants:  Orthopedics  Procedures:  None  Antibiotics:    Anti-infectives    None       Lab Results  Component Value Date   PLT 166 04/08/2017      Anti-infectives    Start     Dose/Rate Route Frequency Ordered Stop   04/07/17 1900  ceFAZolin (ANCEF) IVPB 2g/100 mL premix     2 g 200 mL/hr over 30 Minutes Intravenous Every 6 hours 04/07/17 1713 04/08/17 0046   04/07/17 1315  ceFAZolin (ANCEF) IVPB 2g/100 mL premix     2 g 200 mL/hr over 30 Minutes Intravenous  Once 04/07/17 1301 04/07/17 1331   04/07/17 1302  ceFAZolin (ANCEF) 2-4 GM/100ML-% IVPB    Comments:  Henrine Screws   : cabinet override      04/07/17 1302 04/07/17 1331        Objective:   Vitals:   04/07/17 2232 04/08/17 0042 04/08/17 0639 04/08/17 0842  BP: 102/63 (!) 103/57 (!) 98/56 (!) 115/47  Pulse:  98 97 98  Resp: 16 16 16    Temp:  99.4 F (37.4 C) 99.8 F (37.7 C)   TempSrc:  Oral Oral   SpO2:  92% 91%     Wt Readings from Last 3 Encounters:  02/29/12 109.1 kg (240 lb 8.4 oz)  02/18/12 90.7 kg (200 lb)  08/08/11 90.7 kg (200 lb)     Intake/Output Summary (Last 24 hours) at 04/08/17 1037 Last data filed at 04/08/17 0407  Gross per 24 hour  Intake          2507.33 ml  Output              650 ml  Net          1857.33 ml     Physical Exam  Awake Alert, Oriented X 3, No new F.N deficits, Normal affect [place].AT,PERRAL Supple Neck,No JVD, No cervical lymphadenopathy appriciated.  Symmetrical Chest wall movement, Good air movement bilaterally, CTAB RRR,No Gallops,Rubs or new Murmurs, No Parasternal Heave +ve B.Sounds, Abd Soft, No tenderness, No organomegaly appriciated, No rebound - guarding or rigidity. No Cyanosis,  Clubbing or edema, No new Rash or bruise      Data Review:    CBC  Recent Labs Lab 04/06/17 1636 04/08/17 0359  WBC 10.9* 8.8  HGB 13.9 10.2*  HCT 41.3 31.4*  PLT 230 166  MCV 83.9 86.0  MCH 28.3 27.9  MCHC 33.7 32.5  RDW 14.0 14.0  LYMPHSABS 2.2  --   MONOABS 0.7  --   EOSABS 0.1  --   BASOSABS 0.1  --     Chemistries   Recent Labs Lab 04/06/17 1636 04/08/17 0359  NA 138 135  K 4.1 4.4  CL 108 107  CO2 20* 22  GLUCOSE 147* 211*  BUN 26* 16  CREATININE 1.54* 1.43*  CALCIUM 9.0 7.8*  AST  --  15  ALT  --  10*  ALKPHOS  --  60  BILITOT  --  0.7   ------------------------------------------------------------------------------------------------------------------ No results for input(s): CHOL, HDL, LDLCALC, TRIG, CHOLHDL, LDLDIRECT in the last 72 hours.  Lab Results  Component Value Date   HGBA1C (H) 09/19/2010    5.7 (NOTE)  According to the ADA Clinical Practice Recommendations for 2011, when HbA1c is used as a screening test:   >=6.5%   Diagnostic of Diabetes Mellitus           (if abnormal result  is confirmed)  5.7-6.4%   Increased risk of developing Diabetes Mellitus  References:Diagnosis and Classification of Diabetes Mellitus,Diabetes Care,2011,34(Suppl 1):S62-S69 and Standards of Medical Care in         Diabetes - 2011,Diabetes WCBJ,6283,15  (Suppl 1):S11-S61.   ------------------------------------------------------------------------------------------------------------------ No results for input(s): TSH, T4TOTAL, T3FREE, THYROIDAB in the last 72 hours.  Invalid input(s): FREET3 ------------------------------------------------------------------------------------------------------------------ No results for input(s): VITAMINB12, FOLATE, FERRITIN, TIBC, IRON, RETICCTPCT in the last 72 hours.  Coagulation profile  Recent Labs Lab 04/06/17 1636  INR 0.96    No results for  input(s): DDIMER in the last 72 hours.  Cardiac Enzymes No results for input(s): CKMB, TROPONINI, MYOGLOBIN in the last 168 hours.  Invalid input(s): CK ------------------------------------------------------------------------------------------------------------------ No results found for: BNP  Inpatient Medications  Scheduled Meds: . aspirin EC  325 mg Oral BID  . docusate sodium  100 mg Oral BID  . gabapentin  300 mg Oral BID  . insulin aspart  0-15 Units Subcutaneous TID WC  . insulin glargine  15 Units Subcutaneous QHS  . metoprolol succinate  25 mg Oral Daily  . multivitamin with minerals  1 tablet Oral Daily  . pantoprazole  40 mg Oral BID AC  . simvastatin  20 mg Oral QHS  . traZODone  150 mg Oral QHS   Continuous Infusions: . lactated ringers 10 mL/hr at 04/07/17 1251   PRN Meds:.acetaminophen **OR** acetaminophen, acetaminophen, bisacodyl, HYDROcodone-acetaminophen, HYDROmorphone (DILAUDID) injection, menthol-cetylpyridinium **OR** phenol, metoCLOPramide **OR** metoCLOPramide (REGLAN) injection, morphine injection, ondansetron **OR** ondansetron (ZOFRAN) IV  Micro Results Recent Results (from the past 240 hour(s))  Surgical PCR screen     Status: None   Collection Time: 04/06/17  8:40 PM  Result Value Ref Range Status   MRSA, PCR NEGATIVE NEGATIVE Final   Staphylococcus aureus NEGATIVE NEGATIVE Final    Comment:        The Xpert SA Assay (FDA approved for NASAL specimens in patients over 46 years of age), is one component of a comprehensive surveillance program.  Test performance has been validated by [facility] for patients greater than or equal to 27 year old. It is not intended to diagnose infection nor to guide or monitor treatment.     Radiology Reports Dg Chest 2 View  Result Date: 04/04/2017 CLINICAL DATA:  Shortness of breath. EXAM: CHEST  2 VIEW COMPARISON:  Two-view chest x-ray 12/16/2016 FINDINGS: The heart is mildly enlarged. Chronic  interstitial coarsening is stable. There is no edema or effusion. No focal airspace disease is present. Atherosclerotic calcifications are noted at the aortic arch. Degenerative changes of the thoracic spine are stable. IMPRESSION: 1. No acute cardiopulmonary disease or significant interval change. Electronically Signed   By: San Morelle M.D.   On: 04/04/2017 13:16   Dg Chest Port 1 View  Result Date: 04/06/2017 CLINICAL DATA:  Right hip fracture. No current chest complaints. History hypertension. EXAM: PORTABLE CHEST 1 VIEW COMPARISON:  04/04/2017 FINDINGS: Cardiac silhouette is borderline enlarged. No mediastinal or hilar masses. No evidence of adenopathy. Lungs are clear.  No convincing pleural effusion.  No pneumothorax. Skeletal structures are demineralized but grossly intact. IMPRESSION: No acute cardiopulmonary disease. Electronically Signed   By: Preston Moore M.D.   On: 04/06/2017 15:47   Dg C-arm 1-60  Min  Result Date: 04/07/2017 CLINICAL DATA:  Operative imaging during ORIF of a right proximal femur intertrochanteric fracture. EXAM: DG C-ARM 61-120 MIN; OPERATIVE RIGHT HIP WITH PELVIS COMPARISON:  04/06/2017 FINDINGS: Two submitted images show placement of an intramedullary rod supporting a compression screw. A compression screw reduces the primary fracture components into near anatomic alignment. The orthopedic hardware is well-seated. There is no evidence of a new fracture or operative complication. IMPRESSION: 1. Well-aligned primary fracture components following ORIF of the comminuted right intertrochanteric fracture. Electronically Signed   By: Preston Moore M.D.   On: 04/07/2017 15:02   Dg Hip Operative Unilat W Or W/o Pelvis Right  Result Date: 04/07/2017 CLINICAL DATA:  Operative imaging during ORIF of a right proximal femur intertrochanteric fracture. EXAM: DG C-ARM 61-120 MIN; OPERATIVE RIGHT HIP WITH PELVIS COMPARISON:  04/06/2017 FINDINGS: Two submitted images show  placement of an intramedullary rod supporting a compression screw. A compression screw reduces the primary fracture components into near anatomic alignment. The orthopedic hardware is well-seated. There is no evidence of a new fracture or operative complication. IMPRESSION: 1. Well-aligned primary fracture components following ORIF of the comminuted right intertrochanteric fracture. Electronically Signed   By: Preston Moore M.D.   On: 04/07/2017 15:02   Dg Hip Unilat  With Pelvis 2-3 Views Right  Result Date: 04/06/2017 CLINICAL DATA:  Right hip pain secondary to a fall today. EXAM: DG HIP (WITH OR WITHOUT PELVIS) 2-3V RIGHT COMPARISON:  None. FINDINGS: There is a comminuted intertrochanteric fracture of the proximal right femur with avulsion of the lesser trochanter. Pelvic bones are intact. Healed left intertrochanteric fracture with intramedullary nail and screw. IMPRESSION: Acute comminuted intertrochanteric fracture of the proximal right femur. Electronically Signed   By: Lorriane Shire M.D.   On: 04/06/2017 15:24   Dg Femur, Min 2 Views Right  Result Date: 04/04/2017 CLINICAL DATA:  Persistent pain after fall several weeks prior EXAM: RIGHT FEMUR 2 VIEWS COMPARISON:  None. FINDINGS: Frontal lateral views were obtained. There is no fracture or dislocation. No abnormal periosteal reaction. There is mild narrowing of the left hip joint. There is patellofemoral joint narrowing. There is chondrocalcinosis in the knee joint. There is extensive vascular calcification in the superficial femoral and popliteal arteries. IMPRESSION: No fracture or dislocation.  No abnormal periosteal reaction. Osteoarthritic change in the hip and knee joints. No knee joint effusion. Chondrocalcinosis is noted in the knee joint, a finding that may be seen with osteoarthritis or with calcium pyrophosphate deposition disease. There is superficial femoral artery and popliteal artery atherosclerosis. Electronically Signed   By:  Lowella Grip III M.D.   On: 04/04/2017 13:15    Time Spent in minutes  30   Jani Gravel M.D on 04/08/2017 at 10:37 AM  Between 7am to 7pm - Pager - [phone]  After 7pm go to [email or web address] - password [email or web address]  [facility] -  Office  [phone]

## 2017-04-08 NOTE — Progress Notes (Signed)
   PATIENT ID: Preston Moore   1 Day Post-Op Procedure(s) (LRB): RIGHT HIP INTRATROCHANTERIC NAILING (Right)  Subjective: Reports he is in pain right hip. Tells me he did well overnight. Feels like he needs better pain control to participate in PT. Nurse reports he has not requested much pain rx.   Objective:  Vitals:   04/08/17 0639 04/08/17 0842  BP: (!) 98/56 (!) 115/47  Pulse: 97 98  Resp: 16   Temp: 99.8 F (37.7 C)      R hip dressing c/d/i Wiggles toes, distally NVI  Labs:   Recent Labs  04/06/17 1636 04/08/17 0359  HGB 13.9 10.2*   Recent Labs  04/06/17 1636 04/08/17 0359  WBC 10.9* 8.8  RBC 4.92 3.65*  HCT 41.3 31.4*  PLT 230 166   Recent Labs  04/06/17 1636 04/08/17 0359  NA 138 135  K 4.1 4.4  CL 108 107  CO2 20* 22  BUN 26* 16  CREATININE 1.54* 1.43*  GLUCOSE 147* 211*  CALCIUM 9.0 7.8*    Assessment and Plan: 1 day s/p right femoral IM nail for fracture Up with PT, touchdown weightbearing RLE Has dilaudid ordered for breakthrough pain but use sparingly ASA 325 mg BID Likely d/c to SNF when cleared by PT/medicine, SW consulted  Will continue to follow, appreciate medicine's management  VTE proph: ASA, SCDs

## 2017-04-09 ENCOUNTER — Encounter (HOSPITAL_COMMUNITY): Payer: Self-pay | Admitting: Orthopaedic Surgery

## 2017-04-09 DIAGNOSIS — N183 Chronic kidney disease, stage 3 (moderate): Secondary | ICD-10-CM

## 2017-04-09 DIAGNOSIS — I10 Essential (primary) hypertension: Secondary | ICD-10-CM

## 2017-04-09 LAB — GLUCOSE, CAPILLARY
GLUCOSE-CAPILLARY: 206 mg/dL — AB (ref 65–99)
GLUCOSE-CAPILLARY: 251 mg/dL — AB (ref 65–99)
GLUCOSE-CAPILLARY: 269 mg/dL — AB (ref 65–99)
Glucose-Capillary: 201 mg/dL — ABNORMAL HIGH (ref 65–99)

## 2017-04-09 LAB — COMPREHENSIVE METABOLIC PANEL
ALBUMIN: 3 g/dL — AB (ref 3.5–5.0)
ALK PHOS: 62 U/L (ref 38–126)
ALT: 8 U/L — AB (ref 17–63)
AST: 31 U/L (ref 15–41)
Anion gap: 9 (ref 5–15)
BUN: 21 mg/dL — AB (ref 6–20)
CALCIUM: 7.9 mg/dL — AB (ref 8.9–10.3)
CO2: 22 mmol/L (ref 22–32)
CREATININE: 1.72 mg/dL — AB (ref 0.61–1.24)
Chloride: 105 mmol/L (ref 101–111)
GFR calc Af Amer: 41 mL/min — ABNORMAL LOW (ref >60)
GFR calc non Af Amer: 36 mL/min — ABNORMAL LOW (ref >60)
GLUCOSE: 210 mg/dL — AB (ref 65–99)
Potassium: 4.2 mmol/L (ref 3.5–5.1)
SODIUM: 136 mmol/L (ref 135–145)
Total Bilirubin: 0.8 mg/dL (ref 0.3–1.2)
Total Protein: 6.1 g/dL — ABNORMAL LOW (ref 6.5–8.1)

## 2017-04-09 LAB — CBC
HCT: 30.2 % — ABNORMAL LOW (ref 39.0–52.0)
HEMOGLOBIN: 9.6 g/dL — AB (ref 13.0–17.0)
MCH: 27.5 pg (ref 26.0–34.0)
MCHC: 31.8 g/dL (ref 30.0–36.0)
MCV: 86.5 fL (ref 78.0–100.0)
Platelets: 160 10*3/uL (ref 150–400)
RBC: 3.49 MIL/uL — AB (ref 4.22–5.81)
RDW: 14.2 % (ref 11.5–15.5)
WBC: 10 10*3/uL (ref 4.0–10.5)

## 2017-04-09 MED ORDER — INSULIN GLARGINE 100 UNIT/ML ~~LOC~~ SOLN
20.0000 [IU] | Freq: Every day | SUBCUTANEOUS | Status: DC
  Administered 2017-04-09: 20 [IU] via SUBCUTANEOUS
  Filled 2017-04-09 (×2): qty 0.2

## 2017-04-09 MED ORDER — POLYETHYLENE GLYCOL 3350 17 G PO PACK
17.0000 g | PACK | Freq: Two times a day (BID) | ORAL | Status: DC
  Administered 2017-04-09 – 2017-04-10 (×3): 17 g via ORAL
  Filled 2017-04-09 (×3): qty 1

## 2017-04-09 MED ORDER — INSULIN GLARGINE 100 UNIT/ML ~~LOC~~ SOLN
10.0000 [IU] | Freq: Once | SUBCUTANEOUS | Status: AC
  Administered 2017-04-09: 10 [IU] via SUBCUTANEOUS
  Filled 2017-04-09: qty 0.1

## 2017-04-09 MED ORDER — BISACODYL 10 MG RE SUPP
10.0000 mg | Freq: Once | RECTAL | Status: AC
Start: 2017-04-09 — End: 2017-04-09
  Administered 2017-04-09: 10 mg via RECTAL
  Filled 2017-04-09: qty 1

## 2017-04-09 MED ORDER — SODIUM CHLORIDE 0.9 % IV BOLUS (SEPSIS)
500.0000 mL | Freq: Once | INTRAVENOUS | Status: AC
  Administered 2017-04-09: 500 mL via INTRAVENOUS

## 2017-04-09 NOTE — Op Note (Signed)
NAME:  Preston Moore, Preston Moore                      ACCOUNT NO.:  MEDICAL RECORD NO.:  [id number]  LOCATION:                                 FACILITY:  PHYSICIAN:  Monico Blitz. Talulah Schirmer, M.D.DATE OF BIRTH:  21-Dec-1933  DATE OF PROCEDURE:  04/07/2017 DATE OF DISCHARGE:                              OPERATIVE REPORT   PREOPERATIVE DIAGNOSIS:  Right hip intertrochanteric fracture.  POSTOPERATIVE DIAGNOSIS:  Right hip intertrochanteric fracture.  PROCEDURE:  Right hip open reduction and internal fixation.  ANESTHESIA:  General.  ATTENDING SURGEON:  Monico Blitz. Rhona Raider, M.D.  ASSISTANT:  Grier Mitts, PA.  INDICATION FOR PROCEDURE:  The patient is an 83 year old man with a recent history of a fall, which caused a right hip intertrochanteric fracture.  He was seen in the emergency room and admitted.  Orthopedics was consulted for management of the fracture.  He is offered ORIF in hopes of stabilizing this fracture and allowing it to heal and hopes that he might sit, stand, and potentially walk again.  He does have a history of an opposite hip fracture treated in a similar fashion years back.  Informed operative consent was obtained after discussion of possible complications including reaction to anesthesia, infection, DVT, and death.  SUMMARY OF FINDINGS AND PROCEDURE:  Under general anesthesia, through some small incisions, a right hip ORIF was performed with Affixus trochanteric nail.  This was stabilized proximally with a locking screw. He had excellent bone quality.  We used fluoroscopy throughout the case to make appropriate intraoperative decisions and read all of these views myself.  I had a PA assist, which was invaluable and that she helped maintain exposure while I placed the hardware.  She also closed simultaneously to help minimize OR time.  DESCRIPTION OF PROCEDURE:  The patient was taken to the operating suite where general anesthetic was applied without difficulty.  He  was positioned on the fracture table and all bony prominences were appropriately padded.  We performed a closed reduction, which reduced the fracture nicely.  He was then prepped and draped in normal sterile fashion.  After the administration of preoperative IV Kefzol and an appropriate time out, a small incision was made superior to the greater trochanter.  Dissection was carried down to the tip of the trochanter where a guidewire was inserted down into the femoral canal, confirmed to be intramedullary on 2 views on fluoroscopy.  I then over reamed with a starting reamer.  We then placed a long guidewire and over reamed this to diameter of 12.5 mm followed by placement of a Biomet Affixus trochanteric nail, which was 11 mm in diameter and 360 mm in length.  We then passed a guidewire with the appropriate guide up into the femoral head, which was seen to be central on 2 views.  I then placed a 120 mm long locking screw proximally.  He had excellent bone with excellent fixation.  We placed a compression device and compressed this fracture nicely.  I then locked the screw.  The guides were removed followed by fluoroscopic examination of 2 planes, which showed good placement of hardware and reduction of fracture.  The wounds  were then irrigated followed by reapproximation of deep tissues with Vicryl and skin with staples.  We placed a Mepilex dressing.  Estimated blood loss and intraoperative fluids obtained from anesthesia records.  DISPOSITION:  The patient was extubated in the operating room and taken to recovery room in stable condition.  He is to be admitted back to the Medicine Service for appropriate postop care.     Monico Blitz Rhona Raider, M.D.     PGD/MEDQ  D:  04/07/2017  T:  04/07/2017  Job:  [id number]

## 2017-04-09 NOTE — Progress Notes (Signed)
Subjective: 2 Days Post-Op Procedure(s) (LRB): RIGHT HIP INTRATROCHANTERIC NAILING (Right)  Activity level:  OOB with PT Diet tolerance:  regular Voiding:  well Patient reports pain as severe.    Objective: Vital signs in last 24 hours: Temp:  [98.6 F (37 C)-98.9 F (37.2 C)] 98.9 F (37.2 C) (06/25 0435) Pulse Rate:  [83-96] 96 (06/25 0435) Resp:  [16] 16 (06/25 0435) BP: (102-120)/(48-68) 120/56 (06/25 0435) SpO2:  [90 %-95 %] 95 % (06/25 0435) Weight:  [109.1 kg (240 lb 8 oz)] 109.1 kg (240 lb 8 oz) (06/25 0435)  Labs:  Recent Labs  04/06/17 1636 04/08/17 0359 04/09/17 0446  HGB 13.9 10.2* 9.6*    Recent Labs  04/08/17 0359 04/09/17 0446  WBC 8.8 10.0  RBC 3.65* 3.49*  HCT 31.4* 30.2*  PLT 166 160    Recent Labs  04/08/17 0359 04/09/17 0446  NA 135 136  K 4.4 4.2  CL 107 105  CO2 22 22  BUN 16 21*  CREATININE 1.43* 1.72*  GLUCOSE 211* 210*  CALCIUM 7.8* 7.9*    Recent Labs  04/06/17 1636  INR 0.96    Physical Exam:  Neurologically intact ABD soft Sensation intact distally Dorsiflexion/Plantar flexion intact Compartment soft  Assessment/Plan:  2 Days Post-Op Procedure(s) (LRB): RIGHT HIP INTRATROCHANTERIC NAILING (Right) Up with therapy Discharge to SNF when medically cleared.  ASA 4 weeks for DVT prophylaxis. PWB is OK    Preston Moore 04/09/2017, 9:35 AM

## 2017-04-09 NOTE — Progress Notes (Signed)
[facility] PROGRESS NOTE  Preston Moore WCH:[id number] DOB: 1934/06/26 DOA: 04/06/2017   PCP: Lajean Manes, MD     Assessment/Plan: Active Problems:   Closed right hip fracture, initial encounter ([place])   Hip fracture ([place])   CKD (chronic kidney disease), stage III   Essential hypertension   Fall    83 y.o. male with medical history significant of atrial flutter not on anticoagulation, currently on sinus rhythm, type 2 diabetes, hypertension, hyperlipidemia, neuropathy, presented after a fall sustaining a right hip pain and fracture.  Assessment and plan #  Hip fracture requiring operative repair, right, closed, initial encounter ([place]) Orthopedics (Dr.Dalldorf)  was consulted  , status post ORIF 6/23 EKG unremarkable. Does not have any chest pain shortness of breath. Deemed to be acceptable risk for intermediate risk surgery. Continue pain control per orthopedics Will need SNF, touchdown weightbearing DVT prophylaxis recommended by orthopedics-aspirin 325 twice a day Incentive spirometer Lovenox discontinued by orthopedics    #Hypertension: Blood pressure soft.  , Will give  normal saline bolus. Continue metoprolol  #Type 2 diabetes: Accu-Cheks uncontrolled, increase Lantus dose postoperatively  #Chronic kidney disease stage 3: Baseline creatinine around 1.4, , creatinine has increased to greater than 1.7. Continue to hold Lasix, recheck renal function tomorrow    #Hyperlipidemia: Continue Zocor.  #Constipation-daughter concerned about not having a bowel movement, started patient on MiraLAX, Dulcolax when necessary  # ABLA 13.9 >9.6 , no indication for transfusion currently    DVT prophylaxsis Lovenox  Code Status:  Full code   Family Communication: Discussed in detail with the patient, all imaging results, lab results explained to the patient   Disposition Plan:  Likely will need SNF. Discussed with daughter Pamala Hurry        Consultants:  Orthopedics  Procedures:  None  Antibiotics: Anti-infectives    Start     Dose/Rate Route Frequency Ordered Stop   04/07/17 1900  ceFAZolin (ANCEF) IVPB 2g/100 mL premix     2 g 200 mL/hr over 30 Minutes Intravenous Every 6 hours 04/07/17 1713 04/08/17 0046   04/07/17 1315  ceFAZolin (ANCEF) IVPB 2g/100 mL premix     2 g 200 mL/hr over 30 Minutes Intravenous  Once 04/07/17 1301 04/07/17 1331   04/07/17 1302  ceFAZolin (ANCEF) 2-4 GM/100ML-% IVPB    Comments:  Henrine Screws   : cabinet override      04/07/17 1302 04/07/17 1331         HPI/Subjective: Patient appears to be comfortable, denies any chest pain or shortness of breath at this time  Objective: Vitals:   04/08/17 1456 04/08/17 1700 04/08/17 1951 04/09/17 0435  BP: (!) 102/48  (!) 105/52 (!) 120/56  Pulse: 87  83 96  Resp: 16  16 16   Temp: 98.6 F (37 C)  98.6 F (37 C) 98.9 F (37.2 C)  TempSrc: Oral  Oral Oral  SpO2: 90% 94% 93% 95%  Weight:    109.1 kg (240 lb 8 oz)    Intake/Output Summary (Last 24 hours) at 04/09/17 0853 Last data filed at 04/09/17 0435  Gross per 24 hour  Intake              120 ml  Output              350 ml  Net             -230 ml    Exam:  Examination:  General exam: Appears calm and  comfortable  Respiratory system: Clear to auscultation. Respiratory effort normal. Cardiovascular system: S1 & S2 heard, RRR. No JVD, murmurs, rubs, gallops or clicks. No pedal edema. Gastrointestinal system: Abdomen is nondistended, soft and nontender. No organomegaly or masses felt. Normal bowel sounds heard. Central nervous system: Alert and oriented. No focal neurological deficits. Extremities: Right leg but tender range of motion Skin: No rashes, lesions or ulcers Psychiatry: Judgement and insight appear normal. Mood & affect appropriate.     Data Reviewed: I have personally reviewed following labs and imaging studies  Micro Results Recent Results (from the past  240 hour(s))  Surgical PCR screen     Status: None   Collection Time: 04/06/17  8:40 PM  Result Value Ref Range Status   MRSA, PCR NEGATIVE NEGATIVE Final   Staphylococcus aureus NEGATIVE NEGATIVE Final    Comment:        The Xpert SA Assay (FDA approved for NASAL specimens in patients over 11 years of age), is one component of a comprehensive surveillance program.  Test performance has been validated by [facility] for patients greater than or equal to 4 year old. It is not intended to diagnose infection nor to guide or monitor treatment.     Radiology Reports Dg Chest 2 View  Result Date: 04/04/2017 CLINICAL DATA:  Shortness of breath. EXAM: CHEST  2 VIEW COMPARISON:  Two-view chest x-ray 12/16/2016 FINDINGS: The heart is mildly enlarged. Chronic interstitial coarsening is stable. There is no edema or effusion. No focal airspace disease is present. Atherosclerotic calcifications are noted at the aortic arch. Degenerative changes of the thoracic spine are stable. IMPRESSION: 1. No acute cardiopulmonary disease or significant interval change. Electronically Signed   By: San Morelle M.D.   On: 04/04/2017 13:16   Dg Chest Port 1 View  Result Date: 04/06/2017 CLINICAL DATA:  Right hip fracture. No current chest complaints. History hypertension. EXAM: PORTABLE CHEST 1 VIEW COMPARISON:  04/04/2017 FINDINGS: Cardiac silhouette is borderline enlarged. No mediastinal or hilar masses. No evidence of adenopathy. Lungs are clear.  No convincing pleural effusion.  No pneumothorax. Skeletal structures are demineralized but grossly intact. IMPRESSION: No acute cardiopulmonary disease. Electronically Signed   By: Lajean Manes M.D.   On: 04/06/2017 15:47   Dg C-arm 1-60 Min  Result Date: 04/07/2017 CLINICAL DATA:  Operative imaging during ORIF of a right proximal femur intertrochanteric fracture. EXAM: DG C-ARM 61-120 MIN; OPERATIVE RIGHT HIP WITH PELVIS COMPARISON:  04/06/2017 FINDINGS:  Two submitted images show placement of an intramedullary rod supporting a compression screw. A compression screw reduces the primary fracture components into near anatomic alignment. The orthopedic hardware is well-seated. There is no evidence of a new fracture or operative complication. IMPRESSION: 1. Well-aligned primary fracture components following ORIF of the comminuted right intertrochanteric fracture. Electronically Signed   By: Lajean Manes M.D.   On: 04/07/2017 15:02   Dg Hip Operative Unilat W Or W/o Pelvis Right  Result Date: 04/07/2017 CLINICAL DATA:  Operative imaging during ORIF of a right proximal femur intertrochanteric fracture. EXAM: DG C-ARM 61-120 MIN; OPERATIVE RIGHT HIP WITH PELVIS COMPARISON:  04/06/2017 FINDINGS: Two submitted images show placement of an intramedullary rod supporting a compression screw. A compression screw reduces the primary fracture components into near anatomic alignment. The orthopedic hardware is well-seated. There is no evidence of a new fracture or operative complication. IMPRESSION: 1. Well-aligned primary fracture components following ORIF of the comminuted right intertrochanteric fracture. Electronically Signed   By: Lajean Manes  M.D.   On: 04/07/2017 15:02   Dg Hip Unilat  With Pelvis 2-3 Views Right  Result Date: 04/06/2017 CLINICAL DATA:  Right hip pain secondary to a fall today. EXAM: DG HIP (WITH OR WITHOUT PELVIS) 2-3V RIGHT COMPARISON:  None. FINDINGS: There is a comminuted intertrochanteric fracture of the proximal right femur with avulsion of the lesser trochanter. Pelvic bones are intact. Healed left intertrochanteric fracture with intramedullary nail and screw. IMPRESSION: Acute comminuted intertrochanteric fracture of the proximal right femur. Electronically Signed   By: Lorriane Shire M.D.   On: 04/06/2017 15:24   Dg Femur, Min 2 Views Right  Result Date: 04/04/2017 CLINICAL DATA:  Persistent pain after fall several weeks prior EXAM:  RIGHT FEMUR 2 VIEWS COMPARISON:  None. FINDINGS: Frontal lateral views were obtained. There is no fracture or dislocation. No abnormal periosteal reaction. There is mild narrowing of the left hip joint. There is patellofemoral joint narrowing. There is chondrocalcinosis in the knee joint. There is extensive vascular calcification in the superficial femoral and popliteal arteries. IMPRESSION: No fracture or dislocation.  No abnormal periosteal reaction. Osteoarthritic change in the hip and knee joints. No knee joint effusion. Chondrocalcinosis is noted in the knee joint, a finding that may be seen with osteoarthritis or with calcium pyrophosphate deposition disease. There is superficial femoral artery and popliteal artery atherosclerosis. Electronically Signed   By: Lowella Grip III M.D.   On: 04/04/2017 13:15     CBC  Recent Labs Lab 04/06/17 1636 04/08/17 0359 04/09/17 0446  WBC 10.9* 8.8 10.0  HGB 13.9 10.2* 9.6*  HCT 41.3 31.4* 30.2*  PLT 230 166 160  MCV 83.9 86.0 86.5  MCH 28.3 27.9 27.5  MCHC 33.7 32.5 31.8  RDW 14.0 14.0 14.2  LYMPHSABS 2.2  --   --   MONOABS 0.7  --   --   EOSABS 0.1  --   --   BASOSABS 0.1  --   --     Chemistries   Recent Labs Lab 04/06/17 1636 04/08/17 0359 04/09/17 0446  NA 138 135 136  K 4.1 4.4 4.2  CL 108 107 105  CO2 20* 22 22  GLUCOSE 147* 211* 210*  BUN 26* 16 21*  CREATININE 1.54* 1.43* 1.72*  CALCIUM 9.0 7.8* 7.9*  AST  --  15 31  ALT  --  10* 8*  ALKPHOS  --  60 62  BILITOT  --  0.7 0.8   ------------------------------------------------------------------------------------------------------------------ CrCl cannot be calculated (Unknown ideal weight.). ------------------------------------------------------------------------------------------------------------------ No results for input(s): HGBA1C in the last 72  hours. ------------------------------------------------------------------------------------------------------------------ No results for input(s): CHOL, HDL, LDLCALC, TRIG, CHOLHDL, LDLDIRECT in the last 72 hours. ------------------------------------------------------------------------------------------------------------------ No results for input(s): TSH, T4TOTAL, T3FREE, THYROIDAB in the last 72 hours.  Invalid input(s): FREET3 ------------------------------------------------------------------------------------------------------------------ No results for input(s): VITAMINB12, FOLATE, FERRITIN, TIBC, IRON, RETICCTPCT in the last 72 hours.  Coagulation profile  Recent Labs Lab 04/06/17 1636  INR 0.96    No results for input(s): DDIMER in the last 72 hours.  Cardiac Enzymes No results for input(s): CKMB, TROPONINI, MYOGLOBIN in the last 168 hours.  Invalid input(s): CK ------------------------------------------------------------------------------------------------------------------ Invalid input(s): POCBNP   CBG:  Recent Labs Lab 04/08/17 0755 04/08/17 1207 04/08/17 1700 04/08/17 2158 04/09/17 0600  GLUCAP 186* 195* 232* 219* 206*       Studies: Dg C-arm 1-60 Min  Result Date: 04/07/2017 CLINICAL DATA:  Operative imaging during ORIF of a right proximal femur intertrochanteric fracture. EXAM: DG C-ARM 61-120 MIN; OPERATIVE RIGHT  HIP WITH PELVIS COMPARISON:  04/06/2017 FINDINGS: Two submitted images show placement of an intramedullary rod supporting a compression screw. A compression screw reduces the primary fracture components into near anatomic alignment. The orthopedic hardware is well-seated. There is no evidence of a new fracture or operative complication. IMPRESSION: 1. Well-aligned primary fracture components following ORIF of the comminuted right intertrochanteric fracture. Electronically Signed   By: Lajean Manes M.D.   On: 04/07/2017 15:02   Dg Hip Operative  Unilat W Or W/o Pelvis Right  Result Date: 04/07/2017 CLINICAL DATA:  Operative imaging during ORIF of a right proximal femur intertrochanteric fracture. EXAM: DG C-ARM 61-120 MIN; OPERATIVE RIGHT HIP WITH PELVIS COMPARISON:  04/06/2017 FINDINGS: Two submitted images show placement of an intramedullary rod supporting a compression screw. A compression screw reduces the primary fracture components into near anatomic alignment. The orthopedic hardware is well-seated. There is no evidence of a new fracture or operative complication. IMPRESSION: 1. Well-aligned primary fracture components following ORIF of the comminuted right intertrochanteric fracture. Electronically Signed   By: Lajean Manes M.D.   On: 04/07/2017 15:02      Lab Results  Component Value Date   HGBA1C (H) 09/19/2010    5.7 (NOTE)                                                                       According to the ADA Clinical Practice Recommendations for 2011, when HbA1c is used as a screening test:   >=6.5%   Diagnostic of Diabetes Mellitus           (if abnormal result  is confirmed)  5.7-6.4%   Increased risk of developing Diabetes Mellitus  References:Diagnosis and Classification of Diabetes Mellitus,Diabetes NKNL,9767,34(LPFXT 1):S62-S69 and Standards of Medical Care in         Diabetes - 2011,Diabetes KWIO,9735,32  (Suppl 1):S11-S61.   Lab Results  Component Value Date   CREATININE 1.72 (H) 04/09/2017       Scheduled Meds: . aspirin EC  325 mg Oral BID  . docusate sodium  100 mg Oral BID  . gabapentin  300 mg Oral BID  . insulin aspart  0-15 Units Subcutaneous TID WC  . insulin glargine  10 Units Subcutaneous Once  . insulin glargine  20 Units Subcutaneous QHS  . metoprolol succinate  25 mg Oral Daily  . multivitamin with minerals  1 tablet Oral Daily  . pantoprazole  40 mg Oral BID AC  . simvastatin  20 mg Oral QHS  . traZODone  150 mg Oral QHS   Continuous Infusions: . lactated ringers 10 mL/hr at 04/07/17  1251  . sodium chloride       LOS: 3 days    Time spent: >30 MINS    Reyne Dumas  [facility] Pager [phone]. If 7PM-7AM, please contact night-coverage at [email or web address], password [facility] 04/09/2017, 8:53 AM  LOS: 3 days

## 2017-04-09 NOTE — NC FL2 (Signed)
[place] LEVEL OF CARE SCREENING TOOL     IDENTIFICATION  Patient Name: Preston Moore Birthdate: [birth year] Sex: male Admission Date (Current Location): 04/06/2017  [facility] and [place] Number:  Herbalist and Address:  [facility] [place], [place] [address], [place], [place] [address]      Provider Number:    Attending Physician Name and Address:  Reyne Dumas, MD  Relative Name and Phone Number:       Current Level of Care: Hospital Recommended Level of Care: [place] Prior Approval Number:    Date Approved/Denied: 04/09/17 PASRR Number: [id number]  Discharge Plan: SNF    Current Diagnoses: Patient Active Problem List   Diagnosis Date Noted  . Fall   . Closed right hip fracture, initial encounter ([place]) 04/06/2017  . Hip fracture ([place]) 04/06/2017  . CKD (chronic kidney disease), stage III   . Essential hypertension   . Type 2 diabetes mellitus with hyperlipidemia ([place])   . Ileus ([place]) 02/29/2012  . Malnutrition ([place]) 02/29/2012  . Obesity (BMI 30.0-34.9) 02/29/2012  . Non compliance with medical treatment 02/29/2012  . SBO (small bowel obstruction) ([place]) 02/23/2012  . Pancreatitis 02/22/2012  . Physical deconditioning 02/22/2012  . B12 deficiency 02/22/2012  . Esophageal reflux disease 02/22/2012  . Acute renal failure ([place]) 02/18/2012  . Hyperkalemia 02/18/2012  . ARF (acute renal failure) ([place]) 02/18/2012  . Nausea and vomiting 02/18/2012  . Diarrhea 02/18/2012  . Iron deficiency anemia 02/18/2012  . Lactic acidosis 02/18/2012  . Dyspnea 06/08/2011  . ANEMIA, IRON DEFICIENCY 11/01/2010  . ISCHEMIC CARDIOMYOPATHY 11/01/2010  . ATRIAL arrhythmia 11/01/2010  . UNSPECIFIED PERIPHERAL VASCULAR DISEASE 10/05/2010    Orientation RESPIRATION BLADDER Height & Weight     Self, Time, Situation, Place  O2 (Nasal Cannula 4L) Continent Weight: 240 lb 8 oz (109.1 kg) Height:     BEHAVIORAL SYMPTOMS/MOOD  NEUROLOGICAL BOWEL NUTRITION STATUS      Continent Diet (See DC Summary)  AMBULATORY STATUS COMMUNICATION OF NEEDS Skin   Extensive Assist Verbally Surgical wounds (Right Thigh Closed Incision with Hydrocolloid Dressing)                       Personal Care Assistance Level of Assistance  Bathing, Feeding, Dressing Bathing Assistance: Maximum assistance Feeding assistance: Limited assistance Dressing Assistance: Maximum assistance     Functional Limitations Info  Sight, Hearing, Speech Sight Info: Adequate Hearing Info: Adequate Speech Info: Adequate    SPECIAL CARE FACTORS FREQUENCY  PT (By licensed PT), OT (By licensed OT)     PT Frequency: 5xweek OT Frequency: 5xweek            Contractures      Additional Factors Info  Code Status, Allergies, Insulin Sliding Scale, Psychotropic Code Status Info: Full Allergies Info: NKA Psychotropic Info: trazadone Insulin Sliding Scale Info: 15 units 3's a day; 20 units       Current Medications (04/09/2017):  This is the current hospital active medication list Current Facility-Administered Medications  Medication Dose Route Frequency Provider Last Rate Last Dose  . acetaminophen (TYLENOL) tablet 650 mg  650 mg Oral Q6H PRN Grier Mitts, PA-C       Or  . acetaminophen (TYLENOL) suppository 650 mg  650 mg Rectal Q6H PRN Grier Mitts, PA-C      . acetaminophen (TYLENOL) tablet 500 mg  500 mg Oral Q6H PRN Grier Mitts, PA-C      . aspirin  EC tablet 325 mg  325 mg Oral BID Grier Mitts, PA-C   325 mg at 04/09/17 0954  . bisacodyl (DULCOLAX) EC tablet 5 mg  5 mg Oral Daily PRN Rosita Fire, MD   5 mg at 04/09/17 0955  . docusate sodium (COLACE) capsule 100 mg  100 mg Oral BID Rosita Fire, MD   100 mg at 04/09/17 0954  . gabapentin (NEURONTIN) capsule 300 mg  300 mg Oral BID Rosita Fire, MD   300 mg at 04/09/17 0954  . HYDROcodone-acetaminophen (NORCO/VICODIN) 5-325 MG per  tablet 1-2 tablet  1-2 tablet Oral Q6H PRN Grier Mitts, PA-C   2 tablet at 04/09/17 0817  . HYDROmorphone (DILAUDID) injection 0.5 mg  0.5 mg Intravenous Q2H PRN Rosita Fire, MD   0.5 mg at 04/08/17 1121  . insulin aspart (novoLOG) injection 0-15 Units  0-15 Units Subcutaneous TID WC Rosita Fire, MD   5 Units at 04/09/17 [id number]  . insulin glargine (LANTUS) injection 20 Units  20 Units Subcutaneous QHS Reyne Dumas, MD      . lactated ringers infusion   Intravenous Continuous Roderic Palau, MD 10 mL/hr at 04/07/17 1251    . menthol-cetylpyridinium (CEPACOL) lozenge 3 mg  1 lozenge Oral PRN Grier Mitts, PA-C       Or  . phenol (CHLORASEPTIC) mouth spray 1 spray  1 spray Mouth/Throat PRN Grier Mitts, PA-C      . metoCLOPramide (REGLAN) tablet 5-10 mg  5-10 mg Oral Q8H PRN Grier Mitts, PA-C       Or  . metoCLOPramide (REGLAN) injection 5-10 mg  5-10 mg Intravenous Q8H PRN Grier Mitts, PA-C      . metoprolol succinate (TOPROL-XL) 24 hr tablet 25 mg  25 mg Oral Daily Rosita Fire, MD   25 mg at 04/09/17 0954  . morphine 4 MG/ML injection 0.52 mg  0.52 mg Intravenous Q2H PRN Grier Mitts, PA-C      . multivitamin with minerals tablet 1 tablet  1 tablet Oral Daily Rosita Fire, MD   1 tablet at 04/09/17 [id number]  . ondansetron (ZOFRAN) tablet 4 mg  4 mg Oral Q6H PRN Grier Mitts, PA-C       Or  . ondansetron (ZOFRAN) injection 4 mg  4 mg Intravenous Q6H PRN Laliberte, Danielle, PA-C      . pantoprazole (PROTONIX) EC tablet 40 mg  40 mg Oral BID AC Rosita Fire, MD   40 mg at 04/09/17 0955  . polyethylene glycol (MIRALAX / GLYCOLAX) packet 17 g  17 g Oral BID Reyne Dumas, MD   17 g at 04/09/17 1057  . simvastatin (ZOCOR) tablet 20 mg  20 mg Oral QHS Rosita Fire, MD   20 mg at 04/08/17 2106  . traZODone (DESYREL) tablet 150 mg  150 mg Oral QHS Rosita Fire, MD   150 mg at 04/08/17 2106      Discharge Medications: Please see discharge summary for a list of discharge medications.  Relevant Imaging Results:  Relevant Lab Results:   Additional Information ss:[address] [place], LCSW

## 2017-04-09 NOTE — Clinical Social Work Note (Signed)
Clinical Social Work Assessment  Patient Details  Name: Preston Moore MRN: [id number] Date of Birth: July 29, 1934  Date of referral:  04/09/17               Reason for consult:  Facility Placement                Permission sought to share information with:  Chartered certified accountant granted to share information::  Yes, Verbal Permission Granted  Name::     Haematologist::  SNF  Relationship::  daughter  Contact Information:     Housing/Transportation Living arrangements for the past 2 months:  Single Family Home Source of Information:  Patient Patient Interpreter Needed:  None Criminal Activity/Legal Involvement Pertinent to Current Situation/Hospitalization:  No - Comment as needed Significant Relationships:  Adult Children, Other Family Members Lives with:  Self Do you feel safe going back to the place where you live?  No Need for family participation in patient care:  Yes (Comment)  Care giving concerns:  Patient resided at home alone prior to hospitalization. Pt not safe to return home at this time.  Social Worker assessment / plan:  CSW met with patient at bedside to discuss recommendations form clinical team for DC. CSW explained her role and SNF options/placement. Patient recommended CSW speak with daughter Pamala Hurry. CSW called daughter and left voicemail. CSW will continue to f/u for family support with SNF process.  Employment status:  Retired Nurse, adult PT Recommendations:  [place] / Referral to community resources:  [place]  Patient/Family's Response to care:  Deferred at this time as CSW is trying to make contact with family.   Patient/Family's Understanding of and Emotional Response to Diagnosis, Current Treatment, and Prognosis:  Deferred at this time.  Emotional Assessment Appearance:  Appears stated age Attitude/Demeanor/Rapport:   (Cooperative) Affect (typically  observed):  Accepting, Appropriate Orientation:  Oriented to Self, Oriented to Place, Oriented to  Time, Oriented to Situation Alcohol / Substance use:  Not Applicable Psych involvement (Current and /or in the community):  No (Comment)  Discharge Needs  Concerns to be addressed:  Care Coordination Readmission within the last 30 days:    Current discharge risk:  Dependent with Mobility, Physical Impairment Barriers to Discharge:  No Barriers Identified   Normajean Baxter, LCSW 04/09/2017, 2:55 PM

## 2017-04-10 LAB — CBC
HEMATOCRIT: 26.5 % — AB (ref 39.0–52.0)
Hemoglobin: 8.7 g/dL — ABNORMAL LOW (ref 13.0–17.0)
MCH: 27.8 pg (ref 26.0–34.0)
MCHC: 32.8 g/dL (ref 30.0–36.0)
MCV: 84.7 fL (ref 78.0–100.0)
PLATELETS: 165 10*3/uL (ref 150–400)
RBC: 3.13 MIL/uL — AB (ref 4.22–5.81)
RDW: 14.1 % (ref 11.5–15.5)
WBC: 7.4 10*3/uL (ref 4.0–10.5)

## 2017-04-10 LAB — COMPREHENSIVE METABOLIC PANEL
ALT: 6 U/L — AB (ref 17–63)
AST: 23 U/L (ref 15–41)
Albumin: 2.7 g/dL — ABNORMAL LOW (ref 3.5–5.0)
Alkaline Phosphatase: 59 U/L (ref 38–126)
Anion gap: 7 (ref 5–15)
BILIRUBIN TOTAL: 0.7 mg/dL (ref 0.3–1.2)
BUN: 19 mg/dL (ref 6–20)
CHLORIDE: 107 mmol/L (ref 101–111)
CO2: 22 mmol/L (ref 22–32)
CREATININE: 1.45 mg/dL — AB (ref 0.61–1.24)
Calcium: 7.9 mg/dL — ABNORMAL LOW (ref 8.9–10.3)
GFR calc Af Amer: 51 mL/min — ABNORMAL LOW (ref >60)
GFR, EST NON AFRICAN AMERICAN: 44 mL/min — AB (ref >60)
GLUCOSE: 211 mg/dL — AB (ref 65–99)
Potassium: 4 mmol/L (ref 3.5–5.1)
Sodium: 136 mmol/L (ref 135–145)
Total Protein: 5.4 g/dL — ABNORMAL LOW (ref 6.5–8.1)

## 2017-04-10 LAB — GLUCOSE, CAPILLARY
GLUCOSE-CAPILLARY: 193 mg/dL — AB (ref 65–99)
Glucose-Capillary: 220 mg/dL — ABNORMAL HIGH (ref 65–99)

## 2017-04-10 MED ORDER — INSULIN ASPART 100 UNIT/ML ~~LOC~~ SOLN
SUBCUTANEOUS | 3 refills | Status: DC
Start: 2017-04-10 — End: 2017-08-21

## 2017-04-10 MED ORDER — POLYETHYLENE GLYCOL 3350 17 G PO PACK: 17.0000 g | PACK | Freq: Two times a day (BID) | ORAL | 0 refills | Status: DC

## 2017-04-10 MED ORDER — BISACODYL 5 MG PO TBEC: 5.0000 mg | DELAYED_RELEASE_TABLET | Freq: Every day | ORAL | 0 refills | Status: DC | PRN

## 2017-04-10 MED ORDER — DOCUSATE SODIUM 100 MG PO CAPS: 100.0000 mg | ORAL_CAPSULE | Freq: Two times a day (BID) | ORAL | 0 refills | Status: DC

## 2017-04-10 NOTE — Social Work (Signed)
CSW obtained Insurance [id number].  CSW called [facility] and advised of same.  CSW will set up transport.

## 2017-04-10 NOTE — Progress Notes (Signed)
Inpatient Diabetes Program Recommendations  AACE/ADA: New Consensus Statement on Inpatient Glycemic Control (2015)  Target Ranges:  Prepandial:   less than 140 mg/dL      Peak postprandial:   less than 180 mg/dL (1-2 hours)      Critically ill patients:  140 - 180 mg/dL   Results for SIRR, KABEL (MRN [id number]) as of 04/10/2017 09:44  Ref. Range 04/09/2017 06:00 04/09/2017 12:10 04/09/2017 16:39 04/09/2017 22:07  Glucose-Capillary Latest Ref Range: 65 - 99 mg/dL 206 (H) 251 (H) 269 (H) 201 (H)   Results for NICHOALS, HEYDE (MRN [id number]) as of 04/10/2017 09:44  Ref. Range 04/10/2017 06:30  Glucose-Capillary Latest Ref Range: 65 - 99 mg/dL 193 (H)    Home DM Meds: Lantus 35 units QHS       Humalog 15 units PRN       Metformin 1000 mg BID  Current Insulin Orders: Lantus 20 units QHS      Novolog Moderate Correction Scale/ SSI (0-15 units) TID AC        MD- Please consider the following in-hospital insulin adjustments:  1. Increase Lantus to 30 units QHS   2. Start low dose Novolog Meal Coverage: Novolog 3 units TID with meals (hold if pt eats <50% of meal)      --Will follow patient during hospitalization--  Wyn Quaker RN, MSN, CDE Diabetes Coordinator Inpatient Glycemic Control Team Team Pager: [phone] (8a-5p)

## 2017-04-10 NOTE — Clinical Social Work Placement (Signed)
   CLINICAL SOCIAL WORK PLACEMENT  NOTE  Date:  04/10/2017  Patient Details  Name: Preston Moore MRN: [id number] Date of Birth: 19-Apr-1934  Clinical Social Work is seeking post-discharge placement for this patient at the [place] level of care (*CSW will initial, date and re-position this form in  chart as items are completed):  Yes   Patient/family provided with [facility] Work Department's list of facilities offering this level of care within the geographic area requested by the patient (or if unable, by the patient's family).  Yes   Patient/family informed of their freedom to choose among providers that offer the needed level of care, that participate in Medicare, Medicaid or managed care program needed by the patient, have an available bed and are willing to accept the patient.  Yes   Patient/family informed of [facility]'s ownership interest in [facility] and [facility], as well as of the fact that they are under no obligation to receive care at these facilities.  PASRR submitted to EDS on       PASRR number received on 04/09/17     Existing PASRR number confirmed on 04/09/17     FL2 transmitted to all facilities in geographic area requested by pt/family on 04/09/17     FL2 transmitted to all facilities within larger geographic area on 04/09/17     Patient informed that his/her managed care company has contracts with or will negotiate with certain facilities, including the following:        Yes   Patient/family informed of bed offers received.  Patient chooses bed at [facility] recommends and patient chooses bed at      Patient to be transferred to [facility] on 04/10/17.  Patient to be transferred to facility by [facility]     Patient family notified on 04/10/17 of transfer.  Name of family member notified:  sister, Pamala Hurry     PHYSICIAN Please prepare priority discharge summary, including  medications, Please prepare prescriptions, Please sign FL2     Additional Comment:    _______________________________________________ Normajean Baxter, LCSW 04/10/2017, 2:03 PM

## 2017-04-10 NOTE — Progress Notes (Signed)
Occupational Therapy Treatment Patient Details Name: Preston Moore MRN: [id number] DOB: [birth year] Today's Date: 04/10/2017    History of present illness 83 y.o.malewith medical history significant of atrial flutter not on anticoagulation, currently on sinus rhythm, DM, HTN, neuropathy, presented after a fall sustaining a right hip fracture. As per patient and his daughter, the family were hiding the patient's cigarettes on the top of the refrigerator. Patient used bucket upside down tried to reach the cigaretteon the top of refrigerator when he tripped and fell sustaining a right hip pain. Pt s/p ORIF 04/07/17.    OT comments  Pt continues to have decreased activity tolerance and requires Max A + 2 for bed mobility. Pt requiring Max A for toilet hygiene at bed level after incontinence in bed; pt having soiled himself upon arrival. Pt maintained sitting balance at EOB for ~8 minutes to perform UB dressing and grooming tasks. Continue to recommend dc to SNF for further OT to increase pt safety and independence with ADLs and functional mobility.    Follow Up Recommendations  SNF;Supervision/Assistance - 24 hour    Equipment Recommendations  Other (comment) (Defer to next venue)    Recommendations for Other Services PT consult    Precautions / Restrictions Precautions Precautions: Fall Restrictions Weight Bearing Restrictions: Yes RLE Weight Bearing: Touchdown weight bearing       Mobility Bed Mobility Overal bed mobility: Needs Assistance Bed Mobility: Rolling;Sidelying to Sit;Sit to Supine Rolling: Max assist Sidelying to sit: Max assist;+2 for physical assistance;+2 for safety/equipment Supine to sit: Max assist;+2 for physical assistance;+2 for safety/equipment Sit to supine: Max assist;+2 for physical assistance;+2 for safety/equipment   General bed mobility comments: max assist with cues for rail use and rolling bil . Physical assist to bring legs off of bed and elevate  trunk. Pt able to balance EOB with bil UE assist  Transfers                 General transfer comment: pt too weak to attempt standing at this time    Balance Overall balance assessment: Needs assistance Sitting-balance support: Feet supported;Single extremity supported Sitting balance-Leahy Scale: Fair Sitting balance - Comments: pt able to sit EOB with and without bil UE support and feet supported, flexed trunk 8 min                                   ADL either performed or assessed with clinical judgement   ADL Overall ADL's : Needs assistance/impaired     Grooming: Set up;Sitting;Applying deodorant;Cueing for sequencing;Supervision/safety           Upper Body Dressing : Sitting;Minimal assistance Upper Body Dressing Details (indicate cue type and reason): Donned new gown with Min VCs and Min A at [place] and Hygiene: Maximal assistance;Bed level;Cueing for sequencing Toileting - Clothing Manipulation Details (indicate cue type and reason): Arrived and pt has soiled himself in bed. Total A for cleaning buttom after BM. Pt able to clean peri care in supine             Vision       Perception     Praxis      Cognition Arousal/Alertness: Awake/alert Behavior During Therapy: Flat affect Overall Cognitive Status: Impaired/Different from baseline Area of Impairment: Following commands;Attention;Safety/judgement;Problem solving;Awareness;Orientation;Memory  Orientation Level: Disoriented to;Situation;Time Current Attention Level: Sustained Memory: Decreased short-term memory Following Commands: Follows one step commands inconsistently;Follows one step commands with increased time Safety/Judgement: Decreased awareness of deficits;Decreased awareness of safety Awareness: Emergent Problem Solving: Slow processing;Decreased initiation;Requires verbal cues;Requires tactile cues           Exercises    Shoulder Instructions       General Comments      Pertinent Vitals/ Pain       Pain Assessment: Faces Pain Score: 5  Faces Pain Scale: Hurts even more Pain Location: Rt hip Pain Descriptors / Indicators: Operative site guarding Pain Intervention(s): Monitored during session;Limited activity within patient's tolerance;Repositioned  Home Living                                          Prior Functioning/Environment              Frequency  Min 2X/week        Progress Toward Goals  OT Goals(current goals can now be found in the care plan section)  Progress towards OT goals: Progressing toward goals  Acute Rehab OT Goals Patient Stated Goal: to stop hurting OT Goal Formulation: With patient Time For Goal Achievement: 04/22/17 Potential to Achieve Goals: Good ADL Goals Pt Will Perform Grooming: with set-up;with supervision;sitting Pt Will Perform Upper Body Dressing: with set-up;with supervision;sitting Pt Will Perform Lower Body Dressing: with adaptive equipment;sit to/from stand;with max assist;with caregiver independent in assisting Pt Will Transfer to Toilet: with max assist;stand pivot transfer;bedside commode Pt Will Perform Toileting - Clothing Manipulation and hygiene: with max assist;sit to/from stand  Plan Discharge plan remains appropriate    Co-evaluation    PT/OT/SLP Co-Evaluation/Treatment: Yes Reason for Co-Treatment: Complexity of the patient's impairments (multi-system involvement);For patient/therapist safety PT goals addressed during session: Mobility/safety with mobility;Balance;Strengthening/ROM        AM-PAC PT "6 Clicks" Daily Activity     Outcome Measure   Help from another person eating meals?: None Help from another person taking care of personal grooming?: A Little Help from another person toileting, which includes using toliet, bedpan, or urinal?: A Lot Help from another person bathing (including  washing, rinsing, drying)?: A Lot Help from another person to put on and taking off regular upper body clothing?: A Lot Help from another person to put on and taking off regular lower body clothing?: A Lot 6 Click Score: 15    End of Session    OT Visit Diagnosis: Unsteadiness on feet (R26.81);Other abnormalities of gait and mobility (R26.89);Muscle weakness (generalized) (M62.81);Pain;History of falling (Z91.81) Pain - Right/Left: Right Pain - part of body: Hip;Leg   Activity Tolerance Patient limited by fatigue;Patient limited by lethargy;Patient limited by pain   Patient Left in bed;with call bell/phone within reach;with bed alarm set;with family/visitor present   Nurse Communication Mobility status;Precautions;Weight bearing status        Time: 1019-1050 OT Time Calculation (min): 31 min  Charges: OT General Charges $OT Visit: 1 Procedure OT Treatments $Self Care/Home Management : 8-22 mins  [place], OTR/L Acute Rehab Pager: [phone] Office: [place] 04/10/2017, 12:23 PM

## 2017-04-10 NOTE — Social Work (Signed)
CSW spoke with daughter Pamala Hurry this morning. She indicated that there are four daughters and that they are working together on placement. CSW provided [employer] [facility] of SNF's that made bed offers. CSW discussed insurance auth and the process. Pamala Hurry would discuss list and get back to CSW.  CSW received call back from daughter and expressed interest in [facility] and [facility]. CSW advised that [facility] has not offered bed and CSW will f/u.  CSW called [facility]) and she indicated that she wld review and get back to CSW. CSW called Alyse Low ([facility]) and left voicemail requesting a call back.  CSW will continue to f/u.

## 2017-04-10 NOTE — Progress Notes (Signed)
Subjective: 3 Days Post-Op Procedure(s) (LRB): RIGHT HIP INTRATROCHANTERIC NAILING (Right)  Activity level:  Sitting in bed Diet tolerance:  Poor PO Voiding:  well Patient reports pain as moderate.    Objective: Vital signs in last 24 hours: Temp:  [98.4 F (36.9 C)-98.8 F (37.1 C)] 98.8 F (37.1 C) (06/26 0451) Pulse Rate:  [79-88] 88 (06/26 1040) Resp:  [16-18] 16 (06/26 0451) BP: (106-107)/(56-58) 106/58 (06/26 0451) SpO2:  [92 %-98 %] 93 % (06/26 1040)  Labs:  Recent Labs  04/08/17 0359 04/09/17 0446 04/10/17 0611  HGB 10.2* 9.6* 8.7*    Recent Labs  04/09/17 0446 04/10/17 0611  WBC 10.0 7.4  RBC 3.49* 3.13*  HCT 30.2* 26.5*  PLT 160 165    Recent Labs  04/09/17 0446 04/10/17 0611  NA 136 136  K 4.2 4.0  CL 105 107  CO2 22 22  BUN 21* 19  CREATININE 1.72* 1.45*  GLUCOSE 210* 211*  CALCIUM 7.9* 7.9*   No results for input(s): LABPT, INR in the last 72 hours.  Physical Exam:  ABD soft, dressing dry, comps soft  Assessment/Plan:  3 Days Post-Op Procedure(s) (LRB): RIGHT HIP INTRATROCHANTERIC NAILING (Right) Up with therapy Discharge to SNF  Really not participating with PT currently.  May PWB.      Preston Moore 04/10/2017, 2:19 PM

## 2017-04-10 NOTE — Progress Notes (Signed)
Physical Therapy Treatment Patient Details Name: Preston Moore MRN: [id number] DOB: March 17, 1934 Today's Date: 04/10/2017    History of Present Illness 83 y.o.malewith medical history significant of atrial flutter not on anticoagulation, currently on sinus rhythm, DM, HTN, neuropathy, presented after a fall sustaining a right hip fracture. As per patient and his daughter, the family were hiding the patient's cigarettes on the top of the refrigerator. Patient used bucket upside down tried to Moore the cigaretteon the top of refrigerator when he tripped and fell sustaining a right hip pain. Pt s/p ORIF 04/07/17.     PT Comments    Pt continues to have cognitive deficits, with very limited with mobility. Pt able to sit EOB and roll but unable to advance to further transfers at this time. Pt educated for increased mobility and plan. Continue to recommend SNF. Pt with incontinent loose stool on arrival, small amount with pericare and linen change performed.    Follow Up Recommendations  SNF;Supervision/Assistance - 24 hour     Equipment Recommendations       Recommendations for Other Services       Precautions / Restrictions Precautions Precautions: Fall Restrictions Weight Bearing Restrictions: Yes RLE Weight Bearing: Touchdown weight bearing    Mobility  Bed Mobility Overal bed mobility: Needs Assistance Bed Mobility: Rolling;Sidelying to Sit;Sit to Supine Rolling: Max assist Sidelying to sit: Max assist;+2 for physical assistance;+2 for safety/equipment       General bed mobility comments: max assist with cues for rail use and rolling bil . Physical assist to bring legs off of bed and elevate trunk. Pt able to balance EOB with bil UE assist  Transfers                 General transfer comment: pt too weak to attempt standing at this time  Ambulation/Gait                 Stairs            Wheelchair Mobility    Modified Rankin (Stroke Patients  Only)       Balance Overall balance assessment: Needs assistance   Sitting balance-Leahy Scale: Fair Sitting balance - Comments: pt able to sit EOB with and without bil UE support and feet supported, flexed trunk 8 min                                    Cognition Arousal/Alertness: Awake/alert Behavior During Therapy: Flat affect Overall Cognitive Status: Impaired/Different from baseline Area of Impairment: Following commands;Attention;Safety/judgement;Problem solving;Awareness;Orientation;Memory                 Orientation Level: Disoriented to;Situation;Time Current Attention Level: Sustained Memory: Decreased short-term memory Following Commands: Follows one step commands inconsistently;Follows one step commands with increased time Safety/Judgement: Decreased awareness of deficits;Decreased awareness of safety   Problem Solving: Slow processing;Decreased initiation;Requires verbal cues;Requires tactile cues        Exercises General Exercises - Lower Extremity Long Arc Quad: AAROM;Right;10 reps;Seated Hip Flexion/Marching: AAROM;Seated;Right;10 reps    General Comments        Pertinent Vitals/Pain Pain Assessment: Faces Pain Score: 5  Pain Location: Rt hip Pain Descriptors / Indicators: Operative site guarding Pain Intervention(s): Limited activity within patient's tolerance;Repositioned;Monitored during session    Home Living                      Prior Function  PT Goals (current goals can now be found in the care plan section) Progress towards PT goals: Progressing toward goals    Frequency           PT Plan Current plan remains appropriate    Co-evaluation PT/OT/SLP Co-Evaluation/Treatment: Yes Reason for Co-Treatment: Complexity of the patient's impairments (multi-system involvement);For patient/therapist safety PT goals addressed during session: Mobility/safety with mobility;Balance;Strengthening/ROM         AM-PAC PT "6 Clicks" Daily Activity  Outcome Measure  Difficulty turning over in bed (including adjusting bedclothes, sheets and blankets)?: Total Difficulty moving from lying on back to sitting on the side of the bed? : Total Difficulty sitting down on and standing up from a chair with arms (e.g., wheelchair, bedside commode, etc,.)?: Total Help needed moving to and from a bed to chair (including a wheelchair)?: Total Help needed walking in hospital room?: Total Help needed climbing 3-5 steps with a railing? : Total 6 Click Score: 6    End of Session   Activity Tolerance: Patient tolerated treatment well Patient left: in bed Nurse Communication: Mobility status;Need for lift equipment PT Visit Diagnosis: Muscle weakness (generalized) (M62.81);History of falling (Z91.81);Pain;Difficulty in walking, not elsewhere classified (R26.2) Pain - Right/Left: Right Pain - part of body: Leg     Time: 5520-8022 PT Time Calculation (min) (ACUTE ONLY): 32 min  Charges:  $Therapeutic Activity: 8-22 mins                    G Codes:       Preston Moore, PT [phone]    Preston Moore Preston Moore 04/10/2017, 10:57 AM

## 2017-04-10 NOTE — Care Management Important Message (Signed)
Important Message  Patient Details  Name: Preston Moore MRN: [id number] Date of Birth: 03-03-1934   Medicare Important Message Given:  Yes    Boaz Berisha Montine Circle 04/10/2017, 11:40 AM

## 2017-04-10 NOTE — Social Work (Signed)
Family interested in [facility], [facility] and [facility]. CSW called [facility] and left msg. CSW advised sister that [facility] reserve their SNF beds for their ALF patients. CSW will continue to f/u. CSW discussed with family importance of selecting SNF due to the time it can take for insurance auth to be obtained. CSW will continue to f/u.  Family has accepted [facility] bed offer. CSW faxed clinicals to [employer] to obtain insurance auth for SNF placement. CSW will continue to f/u.

## 2017-04-10 NOTE — Progress Notes (Signed)
Attempted to call report to [facility] SN[facility]. Receiving RN unable to take report, left voicemail with callback number. [facility] to transport. Will continue to monitor

## 2017-04-10 NOTE — Social Work (Addendum)
Clinical Social Worker facilitated patient discharge including contacting patient family and facility to confirm patient discharge plans.  Clinical information faxed to facility and family agreeable with plan.    CSW arranged ambulance transport via [facility] to [facility] [place].    RN to call [phone] to give report prior to discharge.  Clinical Social Worker will sign off for now as social work intervention is no longer needed. Please consult [place] again if new need arises.  Elissa Hefty, LCSW Clinical Social Worker [phone]

## 2017-04-10 NOTE — Discharge Summary (Signed)
Physician Discharge Summary  REDFORD BEHRLE MRN: [id number] DOB/AGE: 01/17/1934 83 y.o.  PCP: Lajean Manes, MD   Admit date: 04/06/2017 Discharge date: 04/10/2017  Discharge Diagnoses:    Active Problems:   Closed right hip fracture, initial encounter ([place])   Hip fracture ([facility])   CKD (chronic kidney disease), stage III   Essential hypertension   Fall    Follow-up recommendations Follow-up with PCP in 3-5 days , including all  additional recommended appointments as below Follow-up CBC, CMP in 3-5 days Follow-up with [name], MD, as recommended Therapy recommendations per orthopedics      Current Discharge Medication List    START taking these medications   Details  aspirin EC 325 MG EC tablet Take 1 tablet (325 mg total) by mouth 2 (two) times daily. Qty: 56 tablet, Refills: 0    bisacodyl (DULCOLAX) 5 MG EC tablet Take 1 tablet (5 mg total) by mouth daily as needed for moderate constipation. Qty: 30 tablet, Refills: 0    docusate sodium (COLACE) 100 MG capsule Take 1 capsule (100 mg total) by mouth 2 (two) times daily. Qty: 10 capsule, Refills: 0    HYDROcodone-acetaminophen (NORCO/VICODIN) 5-325 MG tablet Take 1-2 tablets by mouth every 6 (six) hours as needed for moderate pain. Qty: 30 tablet, Refills: 0    insulin aspart (NOVOLOG) 100 UNIT/ML injection Question Answer Comment Correction coverage: Moderate (average weight, post-op)  CBG < 70: implement hypoglycemia protocol  CBG 70 - 120: 0 units  CBG 121 - 150: 2 units  CBG 151 - 200: 3 units  CBG 201 - 250: 5 units  CBG 251 - 300: 8 units  CBG 301 - 350: 11 units  CBG 351 - 400: 15 units  CBG > 400 call MD and obtain STAT lab verification Qty: 10 mL, Refills: 3    polyethylene glycol (MIRALAX / GLYCOLAX) packet Take 17 g by mouth 2 (two) times daily. Qty: 14 each, Refills: 0      CONTINUE these medications which have NOT CHANGED   Details  furosemide (LASIX) 20 MG tablet Take 20 mg by mouth  daily.    gabapentin (NEURONTIN) 300 MG capsule Take 300 mg by mouth 2 (two) times daily.     insulin glargine (LANTUS) 100 UNIT/ML injection Inject 35 Units into the skin at bedtime.     metoprolol succinate (TOPROL-XL) 25 MG 24 hr tablet Take 25 mg by mouth daily.    pantoprazole (PROTONIX) 40 MG tablet Take 1 tablet (40 mg total) by mouth 2 (two) times daily before a meal.    simvastatin (ZOCOR) 20 MG tablet Take 20 mg by mouth at bedtime.    traZODone (DESYREL) 150 MG tablet Take 150 mg by mouth at bedtime.    ferrous sulfate 325 (65 FE) MG tablet Take 1 tablet (325 mg total) by mouth 2 (two) times daily with a meal. Qty: 60 tablet, Refills: 0    Multiple Vitamin (MULITIVITAMIN WITH MINERALS) TABS Take 1 tablet by mouth daily.    sucralfate (CARAFATE) 1 GM/10ML suspension Take 10 mLs (1 g total) by mouth 4 (four) times daily -  with meals and at bedtime. Qty: 420 mL      STOP taking these medications     acetaminophen (TYLENOL) 500 MG tablet      HUMALOG 100 UNIT/ML injection      metFORMIN (GLUCOPHAGE) 500 MG tablet          Discharge Condition: *Stable Discharge Instructions Get Medicines reviewed  and adjusted: Please take all your medications with you for your next visit with your Primary MD  Please request your Primary MD to go over all hospital tests and procedure/radiological results at the follow up, please ask your Primary MD to get all Hospital records sent to his/her office.  If you experience worsening of your admission symptoms, develop shortness of breath, life threatening emergency, suicidal or homicidal thoughts you must seek medical attention immediately by calling 911 or calling your MD immediately if symptoms less severe.  You must read complete instructions/literature along with all the possible adverse reactions/side effects for all the Medicines you take and that have been prescribed to you. Take any new Medicines after you have completely  understood and accpet all the possible adverse reactions/side effects.   Do not drive when taking Pain medications.   Do not take more than prescribed Pain, Sleep and Anxiety Medications  Special Instructions: If you have smoked or chewed Tobacco in the last 2 yrs please stop smoking, stop any regular Alcohol and or any Recreational drug use.  Wear Seat belts while driving.  Please note  You were cared for by a hospitalist during your hospital stay. Once you are discharged, your primary care physician will handle any further medical issues. Please note that NO REFILLS for any discharge medications will be authorized once you are discharged, as it is imperative that you return to your primary care physician (or establish a relationship with a primary care physician if you do not have one) for your aftercare needs so that they can reassess your need for medications and monitor your lab values.  Discharge Instructions    Diet - low sodium heart healthy    Complete by:  As directed    Increase activity slowly    Complete by:  As directed    Touch down weight bearing    Complete by:  As directed        No Known Allergies    Disposition: 01-Home or Self Care   Consults:  Orthopedics    Significant Diagnostic Studies:  Dg Chest 2 View  Result Date: 04/04/2017 CLINICAL DATA:  Shortness of breath. EXAM: CHEST  2 VIEW COMPARISON:  Two-view chest x-ray 12/16/2016 FINDINGS: The heart is mildly enlarged. Chronic interstitial coarsening is stable. There is no edema or effusion. No focal airspace disease is present. Atherosclerotic calcifications are noted at the aortic arch. Degenerative changes of the thoracic spine are stable. IMPRESSION: 1. No acute cardiopulmonary disease or significant interval change. Electronically Signed   By: Christopher  Mattern M.D.   On: 04/04/2017 13:16   Dg Chest Port 1 View  Result Date: 04/06/2017 CLINICAL DATA:  Right hip fracture. No current chest  complaints. History hypertension. EXAM: PORTABLE CHEST 1 VIEW COMPARISON:  04/04/2017 FINDINGS: Cardiac silhouette is borderline enlarged. No mediastinal or hilar masses. No evidence of adenopathy. Lungs are clear.  No convincing pleural effusion.  No pneumothorax. Skeletal structures are demineralized but grossly intact. IMPRESSION: No acute cardiopulmonary disease. Electronically Signed   By: David  Ormond M.D.   On: 04/06/2017 15:47   Dg C-arm 1-60 Min  Result Date: 04/07/2017 CLINICAL DATA:  Operative imaging during ORIF of a right proximal femur intertrochanteric fracture. EXAM: DG C-ARM 61-120 MIN; OPERATIVE RIGHT HIP WITH PELVIS COMPARISON:  04/06/2017 FINDINGS: Two submitted images show placement of an intramedullary rod supporting a compression screw. A compression screw reduces the primary fracture components into near anatomic alignment. The orthopedic hardware is well-seated. There is   no evidence of a new fracture or operative complication. IMPRESSION: 1. Well-aligned primary fracture components following ORIF of the comminuted right intertrochanteric fracture. Electronically Signed   By: David  Ormond M.D.   On: 04/07/2017 15:02   Dg Hip Operative Unilat W Or W/o Pelvis Right  Result Date: 04/07/2017 CLINICAL DATA:  Operative imaging during ORIF of a right proximal femur intertrochanteric fracture. EXAM: DG C-ARM 61-120 MIN; OPERATIVE RIGHT HIP WITH PELVIS COMPARISON:  04/06/2017 FINDINGS: Two submitted images show placement of an intramedullary rod supporting a compression screw. A compression screw reduces the primary fracture components into near anatomic alignment. The orthopedic hardware is well-seated. There is no evidence of a new fracture or operative complication. IMPRESSION: 1. Well-aligned primary fracture components following ORIF of the comminuted right intertrochanteric fracture. Electronically Signed   By: David  Ormond M.D.   On: 04/07/2017 15:02   Dg Hip Unilat  With Pelvis  2-3 Views Right  Result Date: 04/06/2017 CLINICAL DATA:  Right hip pain secondary to a fall today. EXAM: DG HIP (WITH OR WITHOUT PELVIS) 2-3V RIGHT COMPARISON:  None. FINDINGS: There is a comminuted intertrochanteric fracture of the proximal right femur with avulsion of the lesser trochanter. Pelvic bones are intact. Healed left intertrochanteric fracture with intramedullary nail and screw. IMPRESSION: Acute comminuted intertrochanteric fracture of the proximal right femur. Electronically Signed   By: Lillian  Maxwell M.D.   On: 04/06/2017 15:24   Dg Femur, Min 2 Views Right  Result Date: 04/04/2017 CLINICAL DATA:  Persistent pain after fall several weeks prior EXAM: RIGHT FEMUR 2 VIEWS COMPARISON:  None. FINDINGS: Frontal lateral views were obtained. There is no fracture or dislocation. No abnormal periosteal reaction. There is mild narrowing of the left hip joint. There is patellofemoral joint narrowing. There is chondrocalcinosis in the knee joint. There is extensive vascular calcification in the superficial femoral and popliteal arteries. IMPRESSION: No fracture or dislocation.  No abnormal periosteal reaction. Osteoarthritic change in the hip and knee joints. No knee joint effusion. Chondrocalcinosis is noted in the knee joint, a finding that may be seen with osteoarthritis or with calcium pyrophosphate deposition disease. There is superficial femoral artery and popliteal artery atherosclerosis. Electronically Signed   By: William  Woodruff III M.D.   On: 04/04/2017 13:15       Filed Weights   04/09/17 0435  Weight: 109.1 kg (240 lb 8 oz)     Microbiology: Recent Results (from the past 240 hour(s))  Surgical PCR screen     Status: None   Collection Time: 04/06/17  8:40 PM  Result Value Ref Range Status   MRSA, PCR NEGATIVE NEGATIVE Final   Staphylococcus aureus NEGATIVE NEGATIVE Final    Comment:        The Xpert SA Assay (FDA approved for NASAL specimens in patients over 21 years of  age), is one component of a comprehensive surveillance program.  Test performance has been validated by [facility] for patients greater than or equal to 1 year old. It is not intended to diagnose infection nor to guide or monitor treatment.        Blood Culture    Component Value Date/Time   SDES BLOOD RIGHT HAND 02/19/2012 1324   SPECREQUEST BOTTLES DRAWN AEROBIC AND ANAEROBIC 5CC 02/19/2012 1324   CULT NO GROWTH 5 DAYS 02/19/2012 1324   REPTSTATUS 02/25/2012 FINAL 02/19/2012 1324      Labs: Results for orders placed or performed during the hospital encounter of 04/06/17 (from the past 48 hour(s))    Glucose, capillary     Status: Abnormal   Collection Time: 04/08/17 12:07 PM  Result Value Ref Range   Glucose-Capillary 195 (H) 65 - 99 mg/dL  Glucose, capillary     Status: Abnormal   Collection Time: 04/08/17  5:00 PM  Result Value Ref Range   Glucose-Capillary 232 (H) 65 - 99 mg/dL  Glucose, capillary     Status: Abnormal   Collection Time: 04/08/17  9:58 PM  Result Value Ref Range   Glucose-Capillary 219 (H) 65 - 99 mg/dL  CBC     Status: Abnormal   Collection Time: 04/09/17  4:46 AM  Result Value Ref Range   WBC 10.0 4.0 - 10.5 K/uL   RBC 3.49 (L) 4.22 - 5.81 MIL/uL   Hemoglobin 9.6 (L) 13.0 - 17.0 g/dL   HCT 30.2 (L) 39.0 - 52.0 %   MCV 86.5 78.0 - 100.0 fL   MCH 27.5 26.0 - 34.0 pg   MCHC 31.8 30.0 - 36.0 g/dL   RDW 14.2 11.5 - 15.5 %   Platelets 160 150 - 400 K/uL  Comprehensive metabolic panel     Status: Abnormal   Collection Time: 04/09/17  4:46 AM  Result Value Ref Range   Sodium 136 135 - 145 mmol/L   Potassium 4.2 3.5 - 5.1 mmol/L   Chloride 105 101 - 111 mmol/L   CO2 22 22 - 32 mmol/L   Glucose, Bld 210 (H) 65 - 99 mg/dL   BUN 21 (H) 6 - 20 mg/dL   Creatinine, Ser 1.72 (H) 0.61 - 1.24 mg/dL   Calcium 7.9 (L) 8.9 - 10.3 mg/dL   Total Protein 6.1 (L) 6.5 - 8.1 g/dL   Albumin 3.0 (L) 3.5 - 5.0 g/dL   AST 31 15 - 41 U/L   ALT 8 (L) 17 - 63 U/L    Alkaline Phosphatase 62 38 - 126 U/L   Total Bilirubin 0.8 0.3 - 1.2 mg/dL   GFR calc non Af Amer 36 (L) >60 mL/min   GFR calc Af Amer 41 (L) >60 mL/min    Comment: (NOTE) The eGFR has been calculated using the CKD EPI equation. This calculation has not been validated in all clinical situations. eGFR's persistently <60 mL/min signify possible Chronic Kidney Disease.    Anion gap 9 5 - 15  Glucose, capillary     Status: Abnormal   Collection Time: 04/09/17  6:00 AM  Result Value Ref Range   Glucose-Capillary 206 (H) 65 - 99 mg/dL  Glucose, capillary     Status: Abnormal   Collection Time: 04/09/17 12:10 PM  Result Value Ref Range   Glucose-Capillary 251 (H) 65 - 99 mg/dL   Comment 1 Notify RN    Comment 2 Document in Chart   Glucose, capillary     Status: Abnormal   Collection Time: 04/09/17  4:39 PM  Result Value Ref Range   Glucose-Capillary 269 (H) 65 - 99 mg/dL   Comment 1 Notify RN    Comment 2 Document in Chart   Glucose, capillary     Status: Abnormal   Collection Time: 04/09/17 10:07 PM  Result Value Ref Range   Glucose-Capillary 201 (H) 65 - 99 mg/dL  CBC     Status: Abnormal   Collection Time: 04/10/17  6:11 AM  Result Value Ref Range   WBC 7.4 4.0 - 10.5 K/uL   RBC 3.13 (L) 4.22 - 5.81 MIL/uL   Hemoglobin 8.7 (L) 13.0 - 17.0 g/dL  HCT 26.5 (L) 39.0 - 52.0 %   MCV 84.7 78.0 - 100.0 fL   MCH 27.8 26.0 - 34.0 pg   MCHC 32.8 30.0 - 36.0 g/dL   RDW 14.1 11.5 - 15.5 %   Platelets 165 150 - 400 K/uL  Comprehensive metabolic panel     Status: Abnormal   Collection Time: 04/10/17  6:11 AM  Result Value Ref Range   Sodium 136 135 - 145 mmol/L   Potassium 4.0 3.5 - 5.1 mmol/L   Chloride 107 101 - 111 mmol/L   CO2 22 22 - 32 mmol/L   Glucose, Bld 211 (H) 65 - 99 mg/dL   BUN 19 6 - 20 mg/dL   Creatinine, Ser 1.45 (H) 0.61 - 1.24 mg/dL   Calcium 7.9 (L) 8.9 - 10.3 mg/dL   Total Protein 5.4 (L) 6.5 - 8.1 g/dL   Albumin 2.7 (L) 3.5 - 5.0 g/dL   AST 23 15 - 41 U/L    ALT 6 (L) 17 - 63 U/L   Alkaline Phosphatase 59 38 - 126 U/L   Total Bilirubin 0.7 0.3 - 1.2 mg/dL   GFR calc non Af Amer 44 (L) >60 mL/min   GFR calc Af Amer 51 (L) >60 mL/min    Comment: (NOTE) The eGFR has been calculated using the CKD EPI equation. This calculation has not been validated in all clinical situations. eGFR's persistently <60 mL/min signify possible Chronic Kidney Disease.    Anion gap 7 5 - 15  Glucose, capillary     Status: Abnormal   Collection Time: 04/10/17  6:30 AM  Result Value Ref Range   Glucose-Capillary 193 (H) 65 - 99 mg/dL     Lipid Panel     Component Value Date/Time   CHOL 56 02/26/2012 0515   TRIG 85 02/26/2012 0515     Lab Results  Component Value Date   HGBA1C (H) 09/19/2010    5.7 (NOTE)                                                                       According to the ADA Clinical Practice Recommendations for 2011, when HbA1c is used as a screening test:   >=6.5%   Diagnostic of Diabetes Mellitus           (if abnormal result  is confirmed)  5.7-6.4%   Increased risk of developing Diabetes Mellitus  References:Diagnosis and Classification of Diabetes Mellitus,Diabetes TOIZ,1245,80(DXIPJ 1):S62-S69 and Standards of Medical Care in         Diabetes - 2011,Diabetes Care,2011,34  (Suppl 1):S11-S61.       HPI    83 y.o.malewith medical history significant of atrial flutter not on anticoagulation, currently on sinus rhythm, type 2 diabetes, hypertension, hyperlipidemia, neuropathy, presented after a fall sustaining a right hip pain and fracture.   HOSPITAL COURSE:    #Hip fracture requiring operative repair, right, closed, initial encounter ([place]) Orthopedics (Dr.Dalldorf) was consulted  , status post ORIF 6/23 EKG unremarkable. Does not have any chest pain shortness of breath. Deemed to be acceptable risk for intermediate risk surgery. Continue pain control per orthopedics Now being discharged to SNF, touchdown  weightbearing DVT prophylaxis recommended by orthopedics-aspirin 325 twice a day 4 weeks Incentive  spirometer      #Hypertension:  Stable, Continue metoprolol  #Type 2 diabetes: Accu-Cheks  stable, continue Lantus and sliding scale insulin, continue Neurontin for neuropathy  #Chronic kidney disease stage3: Baseline creatinine around 1.4, , creatinine has increased to greater than 1.7. Continue to hold Lasix, renal function improved to 1.45 after 1 fluid bolus    #Hyperlipidemia: Continue Zocor.  #Constipation-daughter concerned about not having a bowel movement, started patient on MiraLAX, Dulcolax when necessary, continue aggressive constipation regimen  # ABLA 13.9 >9.6>8.7 , no indication for transfusion currently, monitor hemoglobin closely    Discharge Exam:   Blood pressure (!) 106/58, pulse 79, temperature 98.8 F (37.1 C), temperature source Oral, resp. rate 16, weight 109.1 kg (240 lb 8 oz), SpO2 98 %.   Cardiovascular system: S1 & S2 heard, RRR. No JVD, murmurs, rubs, gallops or clicks. No pedal edema. Gastrointestinal system: Abdomen is nondistended, soft and nontender. No organomegaly or masses felt. Normal bowel sounds heard. Central nervous system: Alert and oriented. No focal neurological deficits. Extremities: Right leg but tender range of motion Skin: No rashes, lesions or ulcers Psychiatry: Judgement and insight appear normal. Mood & affect appropriate.    Follow-up Information    Dalldorf, Peter, MD. Schedule an appointment as soon as possible for a visit in 2 week(s).   Specialty:  Orthopedic Surgery Contact information: [address] [place] [address] [phone]        Stoneking, Hal, MD. Call.   Specialty:  Internal Medicine Why:  Hospital follow-up in 3-5 days Contact information: [address]  [place] [address] [phone]           Signed:   04/10/2017, 9:08 AM        Time spent >1  hour  

## 2017-08-21 ENCOUNTER — Encounter: Payer: Self-pay | Admitting: Adult Health

## 2017-08-21 ENCOUNTER — Non-Acute Institutional Stay (SKILLED_NURSING_FACILITY): Payer: Medicare Other | Admitting: Adult Health

## 2017-08-21 DIAGNOSIS — E1140 Type 2 diabetes mellitus with diabetic neuropathy, unspecified: Secondary | ICD-10-CM

## 2017-08-21 DIAGNOSIS — N183 Chronic kidney disease, stage 3 unspecified: Secondary | ICD-10-CM

## 2017-08-21 DIAGNOSIS — I10 Essential (primary) hypertension: Secondary | ICD-10-CM | POA: Diagnosis not present

## 2017-08-21 DIAGNOSIS — D508 Other iron deficiency anemias: Secondary | ICD-10-CM | POA: Diagnosis not present

## 2017-08-21 DIAGNOSIS — E1165 Type 2 diabetes mellitus with hyperglycemia: Secondary | ICD-10-CM

## 2017-08-21 DIAGNOSIS — Z794 Long term (current) use of insulin: Secondary | ICD-10-CM | POA: Diagnosis not present

## 2017-08-21 DIAGNOSIS — E785 Hyperlipidemia, unspecified: Secondary | ICD-10-CM

## 2017-08-21 DIAGNOSIS — IMO0002 Reserved for concepts with insufficient information to code with codable children: Secondary | ICD-10-CM | POA: Insufficient documentation

## 2017-08-21 DIAGNOSIS — E1129 Type 2 diabetes mellitus with other diabetic kidney complication: Secondary | ICD-10-CM

## 2017-08-21 DIAGNOSIS — E1169 Type 2 diabetes mellitus with other specified complication: Secondary | ICD-10-CM

## 2017-08-21 NOTE — Progress Notes (Signed)
Location:   [facility] Room Number: 226 A Place of Service:  SNF (31)   CODE STATUS: DNR  No Known Allergies  Chief Complaint  Patient presents with  . Acute Visit    Transfer in from [facility]    HPI:  He is a 83 year old who has been transferred from assisted to this facility. This does represent a long term placement for him. He did have a right hip fracture in June 2018. He does have burning in tingling in both feet from his diabetes. He states he has a good appetite; and denies any insomnia. He will be followed for his chronic illnesses including:hypertension; diabetes and gerd.there are no nursing concerns a this time.    Past Medical History:  Diagnosis Date  . Atrial flutter ([place])   . B12 deficiency 02/22/2012  . Closed right hip fracture, initial encounter ([place]) 04/06/2017  . Diabetes mellitus   . Hyperlipidemia   . Hypertension   . Ileus ([place]) 02/29/2012  . Malnutrition ([place]) 02/29/2012  . Neuropathy   . Non compliance with medical treatment 02/29/2012  . Pancreatitis 02/22/2012  . SBO (small bowel obstruction) ([place]) 02/23/2012  . Syncope and collapse     Past Surgical History:  Procedure Laterality Date  . BACK SURGERY    . LAMINECTOMY  1965    Social History   Socioeconomic History  . Marital status: Widowed    Spouse name: Not on file  . Number of children: Y  . Years of education: Not on file  . Highest education level: Not on file  Social Needs  . Financial resource strain: Not on file  . Food insecurity - worry: Not on file  . Food insecurity - inability: Not on file  . Transportation needs - medical: Not on file  . Transportation needs - non-medical: Not on file  Occupational History  . Occupation: retired Research scientist (medical): RETIRED  Tobacco Use  . Smoking status: Current Every Day Smoker    Packs/day: 0.50    Years: 10.00    Pack years: 5.00    Types: Cigarettes  . Smokeless tobacco: Never Used  Substance and Sexual  Activity  . Alcohol use: Yes    Alcohol/week: 21.6 oz    Types: 36 Cans of beer per week  . Drug use: No  . Sexual activity: No  Other Topics Concern  . Not on file  Social History Narrative  . Not on file   Family History  Problem Relation Age of Onset  . Hypertension Neg Hx   . Coronary artery disease Neg Hx   . Diabetes Neg Hx       VITAL SIGNS BP 136/76   Pulse (!) 58   Temp 98.7 F (37.1 C)   Resp 18   Ht 6' (1.829 m)   SpO2 93%   BMI 32.62 kg/m     Outpatient Encounter Medications as of 08/21/2017  Medication Sig  . Amino Acids-Protein Hydrolys (FEEDING SUPPLEMENT, PRO-STAT SUGAR FREE 64,) LIQD Take 30 mLs 2 (two) times daily by mouth.  . Cholecalciferol 50000 units TABS Give 1 tablet by mouth every Saturday  . docusate sodium (COLACE) 100 MG capsule Take 1 capsule (100 mg total) by mouth 2 (two) times daily.  . ferrous sulfate 325 (65 FE) MG tablet Take 1 tablet (325 mg total) by mouth 2 (two) times daily with a meal.  . furosemide (LASIX) 20 MG tablet Take 20 mg by mouth daily.  Marland Kitchen  gabapentin (NEURONTIN) 300 MG capsule Take 300 mg by mouth 2 (two) times daily.   Marland Kitchen HYDROcodone-acetaminophen (NORCO/VICODIN) 5-325 MG tablet Take 1-2 tablets by mouth every 6 (six) hours as needed for moderate pain.  Marland Kitchen insulin aspart (NOVOLOG) 100 UNIT/ML injection Inject as per sliding scale subcutaneously before meals 0 - 200 = 0 units 201 - 250 = 2 units 251 - 300 = 4 units 301 - 350 = 6 units 351 - 400 = 8 units. If BS is greater than 400 = 10 units. Push fluids and repeat in 2 hours.  If remains greater than 400 upon recheck, notify MD  . insulin glargine (LANTUS) 100 UNIT/ML injection Inject 40 Units at bedtime into the skin.   . Menthol-Zinc Oxide (CALMOSEPTINE) 0.44-20.6 % OINT Apply to peri area topically every day and night shift for incontinence.  Apply after incontinent episodes  . metoprolol succinate (TOPROL-XL) 25 MG 24 hr tablet Take 25 mg by mouth daily.  .  Multiple Vitamin (MULITIVITAMIN WITH MINERALS) TABS Take 1 tablet by mouth daily.  . pantoprazole (PROTONIX) 40 MG tablet Take 1 tablet (40 mg total) by mouth 2 (two) times daily before a meal.  . polyethylene glycol (MIRALAX / GLYCOLAX) packet Take 17 g by mouth 2 (two) times daily.  . simvastatin (ZOCOR) 20 MG tablet Take 20 mg by mouth at bedtime.  . Skin Protectants, Misc. (EUCERIN) cream Apply to BLE topically two times daily for dry skin  . traZODone (DESYREL) 100 MG tablet Take 100 mg at bedtime by mouth.    No facility-administered encounter medications on file as of 08/21/2017.      SIGNIFICANT DIAGNOSTIC EXAMS  NONE RECENT  LABS REVIEWED: TODAY:    04-10-17: wbc 7.4; hgb 8.7; hct 26.5; mcv 84.7; plt 165; glucose 211; bun 19; creat 1.45; k+ 4.0; na++ 136; ca 7.4; liver normal albumin 2.7   Review of Systems  Constitutional: Negative for malaise/fatigue.  Respiratory: Negative for cough and shortness of breath.   Cardiovascular: Negative for chest pain, palpitations and leg swelling.  Gastrointestinal: Negative for abdominal pain, constipation and heartburn.  Musculoskeletal: Negative for back pain, joint pain and myalgias.  Skin: Negative.   Neurological: Negative for dizziness.       Burning and tingling in feet   Psychiatric/Behavioral: The patient is not nervous/anxious.     Physical Exam  Constitutional: He is oriented to person, place, and time. He appears well-developed and well-nourished. No distress.  Obese   Eyes: Conjunctivae are normal.  Neck: Neck supple. No thyromegaly present.  Cardiovascular: Normal rate, regular rhythm, normal heart sounds and intact distal pulses.  Pulmonary/Chest: Effort normal and breath sounds normal. No respiratory distress.  Abdominal: Soft. Bowel sounds are normal. He exhibits no distension. There is no tenderness.  Musculoskeletal: He exhibits edema.  Trace lower extremity edema Is able move all four extremities     Lymphadenopathy:    He has no cervical adenopathy.  Neurological: He is alert and oriented to person, place, and time.  Skin: Skin is warm and dry. He is not diaphoretic.  Lower legs discolored  Right heel ulceration without signs of infection present.   Psychiatric: He has a normal mood and affect.    ASSESSMENT/ PLAN:  TODAY:   1. Iron defiency: stable hgb 8.7; will continue iron twice daily   2. Lower extremity edema: stable; will continue will continue lasix 20 mg daily   3. Gerd: stable will continue protonix 40 mg twice daily  4. Dyslipidemia: stable will continue zocor 20 mg daily   5. Diabetes: stable will continue lantus 40 units nightly novolog SSI: 201-250=2 units; 251-300= 4 units; 301-350= 6 units; 351-400= 8 units; >400 10 units  6. Hypertension: stable will continue toprol xl 25 mg daily  7. Peripheral neuropathy: stable will continue neurontin 300 mg twice daily has vicodin 5/325 mg 1 or 2 tabs every 6 hours as needed  8. Vitamin D deficiency: stable will continue vit d 50,000 units weekly  9. Constipation: stable will continue colace twice daily and miralax twice daily   10. Right heel ulceration: will continue prostat 30 cc twice daily will continue to be followed by wound dr.  11. Depression with insomnia: is stable will continue trazodone 100 mg nightly   12. Chronic renal disease stage III: stable bun 19; creat 1.45    MD is aware of resident's narcotic use and is in agreement with current plan of care. We will attempt to wean resident as apropriate   Ok Edwards NP [facility]  Contact [phone] Monday through Friday 8am- 5pm  After hours call [phone]

## 2017-09-25 ENCOUNTER — Encounter: Payer: Self-pay | Admitting: Adult Health

## 2017-09-25 ENCOUNTER — Non-Acute Institutional Stay (SKILLED_NURSING_FACILITY): Payer: Medicare Other | Admitting: Adult Health

## 2017-09-25 DIAGNOSIS — E785 Hyperlipidemia, unspecified: Secondary | ICD-10-CM

## 2017-09-25 DIAGNOSIS — E1140 Type 2 diabetes mellitus with diabetic neuropathy, unspecified: Secondary | ICD-10-CM | POA: Diagnosis not present

## 2017-09-25 DIAGNOSIS — K219 Gastro-esophageal reflux disease without esophagitis: Secondary | ICD-10-CM

## 2017-09-25 DIAGNOSIS — IMO0002 Reserved for concepts with insufficient information to code with codable children: Secondary | ICD-10-CM

## 2017-09-25 DIAGNOSIS — D508 Other iron deficiency anemias: Secondary | ICD-10-CM | POA: Diagnosis not present

## 2017-09-25 DIAGNOSIS — Z794 Long term (current) use of insulin: Secondary | ICD-10-CM

## 2017-09-25 DIAGNOSIS — E1129 Type 2 diabetes mellitus with other diabetic kidney complication: Secondary | ICD-10-CM | POA: Diagnosis not present

## 2017-09-25 DIAGNOSIS — E1169 Type 2 diabetes mellitus with other specified complication: Secondary | ICD-10-CM

## 2017-09-25 DIAGNOSIS — E1165 Type 2 diabetes mellitus with hyperglycemia: Secondary | ICD-10-CM | POA: Diagnosis not present

## 2017-09-25 NOTE — Progress Notes (Signed)
Location:   [facility] Room Number: 226 A Place of Service:  SNF (31)   CODE STATUS: DNR  No Known Allergies  Chief Complaint  Patient presents with  . Medical Management of Chronic Issues    Anemia; edema; gerd dyslipidemia:     HPI:  He is a 83 year old long term resident of this facility being seen for the management of his chronic illnesses: gerd without esophagitis; dyslipidemia associated with type II diabetes mellitus; diabetes type II with renal manifestations and diabetic neuropathy; iron deficiency anemia. He denies any chest pain; shortness of breath; denies back pain or joint pain; he denies heart burn. There are no nursing concerns at this time.   Past Medical History:  Diagnosis Date  . Atrial flutter ([place])   . B12 deficiency 02/22/2012  . Closed right hip fracture, initial encounter ([place]) 04/06/2017  . Diabetes mellitus   . Hyperlipidemia   . Hypertension   . Ileus ([place]) 02/29/2012  . Malnutrition ([place]) 02/29/2012  . Neuropathy   . Non compliance with medical treatment 02/29/2012  . Pancreatitis 02/22/2012  . SBO (small bowel obstruction) ([place]) 02/23/2012  . Syncope and collapse     Past Surgical History:  Procedure Laterality Date  . BACK SURGERY    . COMPRESSION HIP SCREW Right 04/07/2017   Procedure: RIGHT HIP INTRATROCHANTERIC NAILING;  Surgeon: [name], MD;  Location: [facility];  Service: Orthopedics;  Laterality: Right;  . LAMINECTOMY  1965    Social History   Socioeconomic History  . Marital status: Widowed    Spouse name: Not on file  . Number of children: Y  . Years of education: Not on file  . Highest education level: Not on file  Social Needs  . Financial resource strain: Not on file  . Food insecurity - worry: Not on file  . Food insecurity - inability: Not on file  . Transportation needs - medical: Not on file  . Transportation needs - non-medical: Not on file  Occupational History  . Occupation: retired Aeronautical engineer: RETIRED  Tobacco Use  . Smoking status: Current Every Day Smoker    Packs/day: 0.50    Years: 10.00    Pack years: 5.00    Types: Cigarettes  . Smokeless tobacco: Never Used  Substance and Sexual Activity  . Alcohol use: Yes    Alcohol/week: 21.6 oz    Types: 36 Cans of beer per week  . Drug use: No  . Sexual activity: No  Other Topics Concern  . Not on file  Social History Narrative  . Not on file   Family History  Problem Relation Age of Onset  . Hypertension Neg Hx   . Coronary artery disease Neg Hx   . Diabetes Neg Hx       VITAL SIGNS BP 128/64   Pulse 76   Temp 98.2 F (36.8 C)   Resp 18   Ht 6' (1.829 m)   Wt 247 lb 1.6 oz (112.1 kg)   SpO2 95%   BMI 33.51 kg/m   Outpatient Encounter Medications as of 09/25/2017  Medication Sig  . Amino Acids-Protein Hydrolys (FEEDING SUPPLEMENT, PRO-STAT SUGAR FREE 64,) LIQD Take 30 mLs 2 (two) times daily by mouth.  . Cholecalciferol 50000 units TABS Give 1 tablet by mouth every Saturday  . docusate sodium (COLACE) 100 MG capsule Take 1 capsule (100 mg total) by mouth 2 (two) times daily.  . ferrous sulfate 325 (65 FE) MG  tablet Take 1 tablet (325 mg total) by mouth 2 (two) times daily with a meal.  . furosemide (LASIX) 20 MG tablet Take 20 mg by mouth daily.  Marland Kitchen gabapentin (NEURONTIN) 300 MG capsule Take 300 mg by mouth 2 (two) times daily.   Marland Kitchen HYDROcodone-acetaminophen (NORCO/VICODIN) 5-325 MG tablet Take 1 tablet by mouth every 6 (six) hours as needed for moderate pain.  Marland Kitchen insulin aspart (NOVOLOG) 100 UNIT/ML injection Inject as per sliding scale subcutaneously before meals 0 - 200 = 0 units 201 - 250 = 2 units 251 - 300 = 4 units 301 - 350 = 6 units 351 - 400 = 8 units. If BS is greater than 400 = 10 units. Push fluids and repeat in 2 hours.  If remains greater than 400 upon recheck, notify MD  . insulin glargine (LANTUS) 100 UNIT/ML injection Inject 40 Units at bedtime into the skin.   . Menthol-Zinc  Oxide (CALMOSEPTINE) 0.44-20.6 % OINT Apply to peri area topically every day and night shift for incontinence.  Apply after incontinent episodes  . metoprolol succinate (TOPROL-XL) 25 MG 24 hr tablet Take 25 mg by mouth daily.  . Multiple Vitamins-Minerals (DECUBI-VITE) CAPS Take 1 capsule by mouth daily.  . pantoprazole (PROTONIX) 40 MG tablet Take 40 mg by mouth daily.  . polyethylene glycol (MIRALAX / GLYCOLAX) packet Take 17 g by mouth 2 (two) times daily.  . simvastatin (ZOCOR) 20 MG tablet Take 20 mg by mouth at bedtime.  . Skin Protectants, Misc. (EUCERIN) cream Apply to BLE topically two times daily for dry skin  . traZODone (DESYREL) 100 MG tablet Take 100 mg at bedtime by mouth.    No facility-administered encounter medications on file as of 09/25/2017.      SIGNIFICANT DIAGNOSTIC EXAMS  NONE RECENT  LABS REVIEWED: PREVIOUS:    04-10-17: wbc 7.4; hgb 8.7; hct 26.5; mcv 84.7; plt 165; glucose 211; bun 19; creat 1.45; k+ 4.0; na++ 136; ca 7.4; liver normal albumin 2.7   NO NEW LABS    Review of Systems  Constitutional: Negative for malaise/fatigue.  Respiratory: Negative for cough and shortness of breath.   Cardiovascular: Negative for chest pain, palpitations and leg swelling.  Gastrointestinal: Negative for abdominal pain, constipation and heartburn.  Musculoskeletal: Negative for back pain, joint pain and myalgias.  Skin: Negative.   Neurological: Negative for dizziness.  Psychiatric/Behavioral: The patient is not nervous/anxious.     Physical Exam  Constitutional: He is oriented to person, place, and time. He appears well-developed and well-nourished. No distress.  Neck: Neck supple. No thyromegaly present.  Abdominal: Soft. Bowel sounds are normal. He exhibits no distension. There is no tenderness.  Musculoskeletal: He exhibits edema.  Is able to move all extremities   Lymphadenopathy:    He has no cervical adenopathy.  Neurological: He is alert and oriented to  person, place, and time.  Skin: Skin is warm and dry. He is not diaphoretic.  Stage III left buttock 3.2 x 1.1 x 0.2 cm Stage II right medial buttock  2.2 x 1.2 x NM cm   Lower legs discolored  Psychiatric: He has a normal mood and affect.     ASSESSMENT/ PLAN:  TODAY:   1. Iron defiency: stable hgb 8.7; will continue iron twice daily   2. Lower extremity edema: stable; will continue will continue lasix 20 mg daily   3. Gerd: stable will continue protonix 40 mg twice daily   4. Dyslipidemia: stable will continue zocor 20 mg  daily   PREVIOUS  5. Diabetes: stable will continue lantus 40 units nightly novolog SSI: 201-250=2 units; 251-300= 4 units; 301-350= 6 units; 351-400= 8 units; >400 10 units  6. Hypertension: stable b/p 128/64 will continue toprol xl 25 mg daily  7. Peripheral neuropathy: stable will continue neurontin 300 mg twice daily has vicodin 5/325 mg 1 or 2 tabs every 6 hours as needed  8. Vitamin D deficiency: stable will continue vit d 50,000 units weekly  9. Constipation: stable will continue colace twice daily and miralax twice daily   10. Pressure ulcerations on buttocks: will continue prostat 30 cc twice daily will continue to be followed by wound dr.  11. Depression with insomnia: is stable will continue trazodone 100 mg nightly   12. Chronic renal disease stage III: stable bun 19; creat 1.45    MD is aware of resident's narcotic use and is in agreement with current plan of care. We will attempt to wean resident as apropriate     Ok Edwards NP [facility]  Contact [phone] Monday through Friday 8am- 5pm  After hours call [phone]

## 2017-09-26 ENCOUNTER — Encounter: Payer: Self-pay | Admitting: Adult Health

## 2017-09-26 NOTE — Progress Notes (Signed)
Location:   [place] Room Number: 226 A Place of Service:  SNF (31)   CODE STATUS: DNR  No Known Allergies  Chief Complaint  Patient presents with  . Acute Visit    Care Plan Meeting    HPI:    Past Medical History:  Diagnosis Date  . Atrial flutter ([place])   . B12 deficiency 02/22/2012  . Closed right hip fracture, initial encounter ([place]) 04/06/2017  . Diabetes mellitus   . Hyperlipidemia   . Hypertension   . Ileus ([place]) 02/29/2012  . Malnutrition ([place]) 02/29/2012  . Neuropathy   . Non compliance with medical treatment 02/29/2012  . Pancreatitis 02/22/2012  . SBO (small bowel obstruction) ([place]) 02/23/2012  . Syncope and collapse     Past Surgical History:  Procedure Laterality Date  . BACK SURGERY    . COMPRESSION HIP SCREW Right 04/07/2017   Procedure: RIGHT HIP INTRATROCHANTERIC NAILING;  Surgeon: [name], MD;  Location: [employer];  Service: Orthopedics;  Laterality: Right;  . LAMINECTOMY  1965    Social History   Socioeconomic History  . Marital status: Widowed    Spouse name: Not on file  . Number of children: Y  . Years of education: Not on file  . Highest education level: Not on file  Social Needs  . Financial resource strain: Not on file  . Food insecurity - worry: Not on file  . Food insecurity - inability: Not on file  . Transportation needs - medical: Not on file  . Transportation needs - non-medical: Not on file  Occupational History  . Occupation: retired Research scientist (medical): RETIRED  Tobacco Use  . Smoking status: Current Every Day Smoker    Packs/day: 0.50    Years: 10.00    Pack years: 5.00    Types: Cigarettes  . Smokeless tobacco: Never Used  Substance and Sexual Activity  . Alcohol use: Yes    Alcohol/week: 21.6 oz    Types: 36 Cans of beer per week  . Drug use: No  . Sexual activity: No  Other Topics Concern  . Not on file  Social History Narrative  . Not on file   Family History  Problem Relation Age  of Onset  . Hypertension Neg Hx   . Coronary artery disease Neg Hx   . Diabetes Neg Hx       VITAL SIGNS BP 128/64   Pulse 76   Temp 98.2 F (36.8 C)   Resp 18   Ht 6' (1.829 m)   Wt 247 lb 1.6 oz (112.1 kg)   SpO2 95%   BMI 33.51 kg/m   Outpatient Encounter Medications as of 09/26/2017  Medication Sig  . Amino Acids-Protein Hydrolys (FEEDING SUPPLEMENT, PRO-STAT SUGAR FREE 64,) LIQD Take 30 mLs 2 (two) times daily by mouth.  . Cholecalciferol 50000 units TABS Give 1 tablet by mouth every Saturday  . docusate sodium (COLACE) 100 MG capsule Take 1 capsule (100 mg total) by mouth 2 (two) times daily.  . ferrous sulfate 325 (65 FE) MG tablet Take 1 tablet (325 mg total) by mouth 2 (two) times daily with a meal.  . furosemide (LASIX) 20 MG tablet Take 20 mg by mouth daily.  Marland Kitchen gabapentin (NEURONTIN) 300 MG capsule Take 300 mg by mouth 2 (two) times daily.   Marland Kitchen HYDROcodone-acetaminophen (NORCO/VICODIN) 5-325 MG tablet Take 1 tablet by mouth every 6 (six) hours as needed for moderate pain.  Marland Kitchen insulin aspart (NOVOLOG)  100 UNIT/ML injection Inject as per sliding scale subcutaneously before meals 0 - 200 = 0 units 201 - 250 = 2 units 251 - 300 = 4 units 301 - 350 = 6 units 351 - 400 = 8 units. If BS is greater than 400 = 10 units. Push fluids and repeat in 2 hours.  If remains greater than 400 upon recheck, notify MD  . insulin glargine (LANTUS) 100 UNIT/ML injection Inject 40 Units at bedtime into the skin.   . Menthol-Zinc Oxide (CALMOSEPTINE) 0.44-20.6 % OINT Apply to peri area topically every day and night shift for incontinence.  Apply after incontinent episodes  . metoprolol succinate (TOPROL-XL) 25 MG 24 hr tablet Take 25 mg by mouth daily.  . Multiple Vitamins-Minerals (DECUBI-VITE) CAPS Take 1 capsule by mouth daily.  . pantoprazole (PROTONIX) 40 MG tablet Take 40 mg by mouth daily.  . polyethylene glycol (MIRALAX / GLYCOLAX) packet Take 17 g by mouth 2 (two) times daily.  .  simvastatin (ZOCOR) 20 MG tablet Take 20 mg by mouth at bedtime.  . Skin Protectants, Misc. (EUCERIN) cream Apply to BLE topically two times daily for dry skin  . traZODone (DESYREL) 100 MG tablet Take 100 mg at bedtime by mouth.    No facility-administered encounter medications on file as of 09/26/2017.      SIGNIFICANT DIAGNOSTIC EXAMS       ASSESSMENT/ PLAN:    MD is aware of resident's narcotic use and is in agreement with current plan of care. We will attempt to wean resident as apropriate   Ok Edwards NP [facility]  Contact [phone] Monday through Friday 8am- 5pm  After hours call [phone]

## 2017-09-27 ENCOUNTER — Encounter: Payer: Self-pay | Admitting: Internal Medicine

## 2017-09-27 ENCOUNTER — Non-Acute Institutional Stay (SKILLED_NURSING_FACILITY): Payer: Medicare Other | Admitting: Internal Medicine

## 2017-09-27 DIAGNOSIS — E785 Hyperlipidemia, unspecified: Secondary | ICD-10-CM | POA: Diagnosis not present

## 2017-09-27 DIAGNOSIS — N183 Chronic kidney disease, stage 3 unspecified: Secondary | ICD-10-CM

## 2017-09-27 DIAGNOSIS — R600 Localized edema: Secondary | ICD-10-CM

## 2017-09-27 DIAGNOSIS — I10 Essential (primary) hypertension: Secondary | ICD-10-CM

## 2017-09-27 DIAGNOSIS — E1169 Type 2 diabetes mellitus with other specified complication: Secondary | ICD-10-CM

## 2017-09-27 DIAGNOSIS — F329 Major depressive disorder, single episode, unspecified: Secondary | ICD-10-CM | POA: Diagnosis not present

## 2017-09-27 DIAGNOSIS — L89319 Pressure ulcer of right buttock, unspecified stage: Secondary | ICD-10-CM | POA: Diagnosis not present

## 2017-09-27 DIAGNOSIS — F5105 Insomnia due to other mental disorder: Secondary | ICD-10-CM

## 2017-09-27 DIAGNOSIS — F32A Depression, unspecified: Secondary | ICD-10-CM

## 2017-09-27 DIAGNOSIS — F99 Mental disorder, not otherwise specified: Secondary | ICD-10-CM

## 2017-09-27 DIAGNOSIS — K219 Gastro-esophageal reflux disease without esophagitis: Secondary | ICD-10-CM | POA: Diagnosis not present

## 2017-09-27 DIAGNOSIS — Z794 Long term (current) use of insulin: Secondary | ICD-10-CM

## 2017-09-27 DIAGNOSIS — E1140 Type 2 diabetes mellitus with diabetic neuropathy, unspecified: Secondary | ICD-10-CM

## 2017-09-27 LAB — LIPID PANEL
Cholesterol: 102 (ref 0–200)
HDL: 27 — AB (ref 35–70)
LDL CALC: 52
TRIGLYCERIDES: 117 (ref 40–160)

## 2017-09-27 LAB — HEPATIC FUNCTION PANEL
ALT: 11 (ref 10–40)
AST: 13 — AB (ref 14–40)
Alkaline Phosphatase: 105 (ref 25–125)
Bilirubin, Total: 0.4

## 2017-09-27 LAB — BASIC METABOLIC PANEL
BUN: 15 (ref 4–21)
Creatinine: 1.3 (ref 0.6–1.3)
GLUCOSE: 204
POTASSIUM: 4 (ref 3.4–5.3)
SODIUM: 139 (ref 137–147)

## 2017-09-27 LAB — CBC AND DIFFERENTIAL
HCT: 43 (ref 41–53)
HEMOGLOBIN: 14.6 (ref 13.5–17.5)
Platelets: 275 (ref 150–399)
WBC: 8.7

## 2017-09-27 LAB — VITAMIN D 25 HYDROXY (VIT D DEFICIENCY, FRACTURES): Vit D, 25-Hydroxy: 47.12

## 2017-09-27 LAB — HEMOGLOBIN A1C: HEMOGLOBIN A1C: 9.9

## 2017-09-27 NOTE — Progress Notes (Signed)
Patient ID: Preston Moore, male   DOB: [birth year], 83 y.o.   MRN: [id number]  Provider:  [place] Location:  [facility] Room Number: [place] of Service:  SNF (31)  PCP: Preston Manes, MD Patient Care Team: Preston Manes, MD as PCP - General (Internal Medicine)  Extended Emergency Contact Information Primary Emergency Contact: Preston Moore Address: [address]          [place], [place] [address] Preston Moore Phone: [phone] Relation: Daughter Secondary Emergency Contact: Preston Moore Address: [address], [address] [place], [place] [address] Preston Moore Phone: [phone] Work Phone: [phone] Mobile Phone: [phone] Relation: Daughter  Code Status: DNR Goals of Care: Advanced Directive information Advanced Directives 09/26/2017  Does Patient Have a Medical Advance Directive? Yes  Type of Advance Directive Out of facility DNR (pink MOST or yellow form)  Does patient want to make changes to medical advance directive? No - Patient declined  Copy of [place] in Chart? -  Would patient like information on creating a medical advance directive? No - Patient declined  Pre-existing out of facility DNR order (yellow form or pink MOST form) Yellow form placed in chart (order not valid for inpatient use)      Chief Complaint  Patient presents with  . New Admit To SNF    New admit to [facility]  . Diabetes    333    HPI: Patient is a 83 y.o. male seen today for admission to SNF from home. He will be a long term resident. SNF labs reviewed from 09/27/17 - WBC 8.7K; Hgb 14.6; Plts 275K; Na 139; K 4; BUN 14.5; Cr 1.3; glucose 204; HCO3 level 26; ALt 11; AST 13; alk phos 105; albumin 3.6; LDL 52; A1c 9.9%.  Iron defiency anemia - stable on iron twice daily. Hgb 14.6. Low B12 level (110) in 2013  Lower extremity edema: stable - stable on lasix 20 mg daily   GERD - stable  on protonix 40 mg twice daily   Dyslipidemia - stable on zocor 20 mg daily. LDL 52; HDL 27   DM - uncontrolled. A1c 9.9%. He takes lantus 40 units nightly and novolog SSI 2-10 units for CBG >200. CBG 353. No low BS reactions. He is not on ACEI/ARB. He does take statin. No recent urine micro/cr ratio. He has neuropathy and takes neurontin 300 mg twice daily; vicodin 5/325 mg 1 or 2 tabs every 6 hours as needed  HTN/hx atrial flutter - BP/HR stable on toprol xl 25 mg daily  Vitamin D deficiency - stable on Vit D 50,000 units weekly. Vit D 25OH level 47.12  Chronic Constipation - stable on colace twice daily and miralax twice daily   Pressure ulcerations on buttocks - he gets prostat 30 cc twice daily per facility protocol. Followed by facility wound care provider. Albumin 3.6.  Depression with insomnia - mood/sleep stable on trazodone 100 mg nightly   CKD - stage 3. Cr 1.3  Hx right hip fx 2/2 mechanical fall - s/p intertrochanteric nail in 03/2017 Dr Rhona Raider  Past Medical History:  Diagnosis Date  . Atrial flutter ([place])   . B12 deficiency 02/22/2012  . Closed right hip fracture, initial encounter ([place]) 04/06/2017  . Diabetes mellitus   . Hyperlipidemia   . Hypertension   . Ileus ([place]) 02/29/2012  . Malnutrition ([place]) 02/29/2012  . Neuropathy   .  Non compliance with medical treatment 02/29/2012  . Pancreatitis 02/22/2012  . SBO (small bowel obstruction) ([place]) 02/23/2012  . Syncope and collapse    Past Surgical History:  Procedure Laterality Date  . BACK SURGERY    . COMPRESSION HIP SCREW Right 04/07/2017   Procedure: RIGHT HIP INTRATROCHANTERIC NAILING;  Surgeon: Preston Nakayama, MD;  Location: [facility];  Service: Orthopedics;  Laterality: Right;  . LAMINECTOMY  1965    reports that he has been smoking cigarettes.  He has a 5.00 pack-year smoking history. he has never used smokeless tobacco. He reports that he drinks about 21.6 oz of alcohol per week. He reports that he does not use  drugs. Social History   Socioeconomic History  . Marital status: Widowed    Spouse name: Not on file  . Number of children: Y  . Years of education: Not on file  . Highest education level: Not on file  Social Needs  . Financial resource strain: Not on file  . Food insecurity - worry: Not on file  . Food insecurity - inability: Not on file  . Transportation needs - medical: Not on file  . Transportation needs - non-medical: Not on file  Occupational History  . Occupation: retired Research scientist (medical): RETIRED  Tobacco Use  . Smoking status: Current Every Day Smoker    Packs/day: 0.50    Years: 10.00    Pack years: 5.00    Types: Cigarettes  . Smokeless tobacco: Never Used  Substance and Sexual Activity  . Alcohol use: Yes    Alcohol/week: 21.6 oz    Types: 36 Cans of beer per week  . Drug use: No  . Sexual activity: No  Other Topics Concern  . Not on file  Social History Narrative  . Not on file    Functional Status Survey:    Family History  Problem Relation Age of Onset  . Hypertension Neg Hx   . Coronary artery disease Neg Hx   . Diabetes Neg Hx     Health Maintenance  Topic Date Due  . FOOT EXAM  08/21/2018 (Originally 09/14/1944)  . INFLUENZA VACCINE  09/25/2018 (Originally 05/16/2017)  . HEMOGLOBIN A1C  09/25/2018 (Originally 03/21/2011)  . OPHTHALMOLOGY EXAM  09/25/2018 (Originally 09/14/1944)  . URINE MICROALBUMIN  09/25/2018 (Originally 09/14/1944)  . TETANUS/TDAP  09/25/2018 (Originally 09[birth year]15/1955)  . PNA vac Low Risk Adult (1 of 2 - PCV13) 09/25/2018 (Originally 09/14/2001)    No Known Allergies  Outpatient Encounter Medications as of 09/27/2017  Medication Sig  . Amino Acids-Protein Hydrolys (FEEDING SUPPLEMENT, PRO-STAT SUGAR FREE 64,) LIQD Take 30 mLs 2 (two) times daily by mouth.  . Cholecalciferol 50000 units TABS Give 1 tablet by mouth every Saturday  . docusate sodium (COLACE) 100 MG capsule Take 1 capsule (100 mg total) by mouth 2  (two) times daily.  . ferrous sulfate 325 (65 FE) MG tablet Take 325 mg by mouth 2 (two) times daily with a meal.  . furosemide (LASIX) 20 MG tablet Take 20 mg by mouth daily.  Marland Kitchen gabapentin (NEURONTIN) 300 MG capsule Take 300 mg by mouth 2 (two) times daily.   Marland Kitchen HYDROcodone-acetaminophen (NORCO/VICODIN) 5-325 MG tablet Take 1 tablet by mouth every 6 (six) hours as needed for moderate pain.  Marland Kitchen insulin aspart (NOVOLOG) 100 UNIT/ML injection Inject as per sliding scale subcutaneously before meals 0 - 200 = 0 units 201 - 250 = 2 units 251 - 300 = 4 units 301 - 350 =  6 units 351 - 400 = 8 units. If BS is greater than 400 = 10 units. Push fluids and repeat in 2 hours.  If remains greater than 400 upon recheck, notify MD  . insulin glargine (LANTUS) 100 UNIT/ML injection Inject 40 Units at bedtime into the skin.   . Menthol-Zinc Oxide (CALMOSEPTINE) 0.44-20.6 % OINT Apply to peri area topically every day and night shift for incontinence.  Apply after incontinent episodes  . metoprolol succinate (TOPROL-XL) 25 MG 24 hr tablet Take 25 mg by mouth daily.  . Multiple Vitamins-Minerals (DECUBI-VITE) CAPS Take 1 capsule by mouth daily.  . pantoprazole (PROTONIX) 40 MG tablet Take 40 mg by mouth daily.  . polyethylene glycol (MIRALAX / GLYCOLAX) packet Take 17 g by mouth 2 (two) times daily.  . simvastatin (ZOCOR) 20 MG tablet Take 20 mg by mouth at bedtime.  . Skin Protectants, Misc. (EUCERIN) cream Apply to BLE topically two times daily for dry skin  . traZODone (DESYREL) 100 MG tablet Take 100 mg at bedtime by mouth.   . ferrous sulfate 325 (65 FE) MG tablet Take 1 tablet (325 mg total) by mouth 2 (two) times daily with a meal.   No facility-administered encounter medications on file as of 09/27/2017.     Review of Systems  Skin: Positive for wound.  Psychiatric/Behavioral: Positive for dysphoric mood.  All other systems reviewed and are negative.   Vitals:   09/27/17 0913  BP: 128/64  Pulse:  76  Resp: 18  Temp: 98.2 F (36.8 C)  TempSrc: Oral  SpO2: 95%  Weight: 247 lb 1.6 oz (112.1 kg)  Height: 6' (1.829 m)   Body mass index is 33.51 kg/m. Physical Exam  Constitutional: He is oriented to person, place, and time. He appears well-developed and well-nourished.  sitting up in bed in NAD  HENT:  Mouth/Throat: Oropharynx is clear and moist.  MMM; no oral thrush  Eyes: Pupils are equal, round, and reactive to light. No scleral icterus.  Neck: Neck supple. Carotid bruit is not present. No thyromegaly present.  Cardiovascular: Normal rate, regular rhythm and intact distal pulses. Exam reveals no gallop and no friction rub.  Murmur (1/6 SEM) heard. Distal trace BLE edema. No calf TTP  Pulmonary/Chest: Effort normal and breath sounds normal. He has no wheezes. He has no rales. He exhibits no tenderness.  Abdominal: Soft. Normal appearance and bowel sounds are normal. He exhibits no distension, no abdominal bruit, no pulsatile midline mass and no mass. There is no hepatomegaly. There is no tenderness. There is no rigidity, no rebound and no guarding. No hernia.  obese  Musculoskeletal: He exhibits edema and tenderness.  Lymphadenopathy:    He has no cervical adenopathy.  Neurological: He is alert and oriented to person, place, and time.  Skin: Skin is warm and dry. No rash noted.  Right buttock wound - followed by facility wound care provider  Psychiatric: He has a normal mood and affect. His behavior is normal. Judgment and thought content normal.    Labs reviewed: Basic Metabolic Panel: Recent Labs    04/08/17 0359 04/09/17 0446 04/10/17 0611  NA 135 136 136  K 4.4 4.2 4.0  CL 107 105 107  CO2 _0 GLUCOSE 211* 210* 211*  BUN 16 21* 19  CREATININE 1.43* 1.72* 1.45*  CALCIUM 7.8* 7.9* 7.9*   Liver Function Tests: Recent Labs    04/08/17 0359 04/09/17 0446 04/10/17 0611  AST _1 ALT 10*  8* 6*  ALKPHOS 60 62 59  BILITOT 0.7 0.8 0.7  PROT 5.6*  6.1* 5.4*  ALBUMIN 3.2* 3.0* 2.7*   No results for input(s): LIPASE, AMYLASE in the last 8760 hours. No results for input(s): AMMONIA in the last 8760 hours. CBC: Recent Labs    04/06/17 1636 04/08/17 0359 04/09/17 0446 04/10/17 0611  WBC 10.9* 8.8 10.0 7.4  NEUTROABS 7.9*  --   --   --   HGB 13.9 10.2* 9.6* 8.7*  HCT 41.3 31.4* 30.2* 26.5*  MCV 83.9 86.0 86.5 84.7  PLT 230 166 160 165   Cardiac Enzymes: No results for input(s): CKTOTAL, CKMB, CKMBINDEX, TROPONINI in the last 8760 hours. BNP: Invalid input(s): POCBNP Lab Results  Component Value Date   HGBA1C (H) 09/19/2010    5.7 (NOTE)                                                                       According to the ADA Clinical Practice Recommendations for 2011, when HbA1c is used as a screening test:   >=6.5%   Diagnostic of Diabetes Mellitus           (if abnormal result  is confirmed)  5.7-6.4%   Increased risk of developing Diabetes Mellitus  References:Diagnosis and Classification of Diabetes Mellitus,Diabetes ZOXW,9604,54(UJWJX 1):S62-S69 and Standards of Medical Care in         Diabetes - 2011,Diabetes BJYN,8295,62  (Suppl 1):S11-S61.   Lab Results  Component Value Date   TSH 2.236 02/19/2012   Lab Results  Component Value Date   VITAMINB12 110 (L) 02/19/2012   Lab Results  Component Value Date   FOLATE 5.6 02/19/2012   Lab Results  Component Value Date   IRON 10 (L) 02/20/2012   TIBC 146 (L) 02/20/2012   FERRITIN 359 (H) 02/20/2012    Imaging and Procedures obtained prior to SNF admission: Dg Chest Port 1 View  Result Date: 04/06/2017 CLINICAL DATA:  Right hip fracture. No current chest complaints. History hypertension. EXAM: PORTABLE CHEST 1 VIEW COMPARISON:  04/04/2017 FINDINGS: Cardiac silhouette is borderline enlarged. No mediastinal or hilar masses. No evidence of adenopathy. Lungs are clear.  No convincing pleural effusion.  No pneumothorax. Skeletal structures are demineralized but grossly  intact. IMPRESSION: No acute cardiopulmonary disease. Electronically Signed   By: Preston Moore M.D.   On: 04/06/2017 15:47   Dg C-arm 1-60 Min  Result Date: 04/07/2017 CLINICAL DATA:  Operative imaging during ORIF of a right proximal femur intertrochanteric fracture. EXAM: DG C-ARM 61-120 MIN; OPERATIVE RIGHT HIP WITH PELVIS COMPARISON:  04/06/2017 FINDINGS: Two submitted images show placement of an intramedullary rod supporting a compression screw. A compression screw reduces the primary fracture components into near anatomic alignment. The orthopedic hardware is well-seated. There is no evidence of a new fracture or operative complication. IMPRESSION: 1. Well-aligned primary fracture components following ORIF of the comminuted right intertrochanteric fracture. Electronically Signed   By: Preston Moore M.D.   On: 04/07/2017 15:02   Dg Hip Operative Unilat W Or W/o Pelvis Right  Result Date: 04/07/2017 CLINICAL DATA:  Operative imaging during ORIF of a right proximal femur intertrochanteric fracture. EXAM: DG C-ARM 61-120 MIN; OPERATIVE RIGHT HIP WITH PELVIS COMPARISON:  04/06/2017 FINDINGS:  Two submitted images show placement of an intramedullary rod supporting a compression screw. A compression screw reduces the primary fracture components into near anatomic alignment. The orthopedic hardware is well-seated. There is no evidence of a new fracture or operative complication. IMPRESSION: 1. Well-aligned primary fracture components following ORIF of the comminuted right intertrochanteric fracture. Electronically Signed   By: Preston Moore M.D.   On: 04/07/2017 15:02   Dg Hip Unilat  With Pelvis 2-3 Views Right  Result Date: 04/06/2017 CLINICAL DATA:  Right hip pain secondary to a fall today. EXAM: DG HIP (WITH OR WITHOUT PELVIS) 2-3V RIGHT COMPARISON:  None. FINDINGS: There is a comminuted intertrochanteric fracture of the proximal right femur with avulsion of the lesser trochanter. Pelvic bones are  intact. Healed left intertrochanteric fracture with intramedullary nail and screw. IMPRESSION: Acute comminuted intertrochanteric fracture of the proximal right femur. Electronically Signed   By: Lorriane Shire M.D.   On: 04/06/2017 15:24    Assessment/Plan   ICD-10-CM   1. Type 2 diabetes mellitus with diabetic neuropathy, with long-term current use of insulin (HCC) E11.40    Z79.4   2. Essential hypertension I10   3. Pressure injury of skin of right buttock, unspecified injury stage L89.319   4. CKD (chronic kidney disease), stage III (HCC) N18.3   5. Dyslipidemia associated with type 2 diabetes mellitus (HCC) E11.69    E78.5   6. Edema of both lower extremities R60.0   7. GERD without esophagitis K21.9   8. Depression, unspecified depression type F32.9   9. Insomnia due to other mental disorder F51.05    F99    2/2 depression   Cont current meds as ordered  PT/OT/ST as ordered  Wound care as ordered  Follow CBGs closely  GOAL: short term rehab then continue long term care.. Communicated with pt and nursing.  Will follow  Labs/tests ordered: urine microalbumin/Creatinine ratio   Camani Sesay S. Perlie Gold  [facility] [address] [place], [place] [address] [phone] Cell (Monday-Friday 8 AM - 5 PM) [phone] After 5 PM and follow prompts

## 2017-09-27 NOTE — Progress Notes (Signed)
This encounter was created in error - please disregard.

## 2017-10-08 LAB — MICROALBUMIN, URINE: Microalb, Ur: 1.2

## 2017-10-10 ENCOUNTER — Non-Acute Institutional Stay (SKILLED_NURSING_FACILITY): Payer: Medicare Other | Admitting: Adult Health

## 2017-10-10 ENCOUNTER — Encounter: Payer: Self-pay | Admitting: Adult Health

## 2017-10-10 DIAGNOSIS — R238 Other skin changes: Secondary | ICD-10-CM | POA: Diagnosis not present

## 2017-10-10 DIAGNOSIS — E1142 Type 2 diabetes mellitus with diabetic polyneuropathy: Secondary | ICD-10-CM | POA: Diagnosis not present

## 2017-10-10 NOTE — Progress Notes (Signed)
Location:   [facility] Room Number: 226 A Place of Service:  SNF (31)   CODE STATUS: DNR  No Known Allergies  Chief Complaint  Patient presents with  . Acute Visit    Burning Feet    HPI:  Nursing reports that he is having bilateral feet pain. He tells me that his feet are burning all the time; he is not receiving adequate relief with his current regimen. He does have blisters on both heels they are intact; but are very painful. There are no reports of fever present. There are no signs of infection present. His heels are not elevated at this time.    Past Medical History:  Diagnosis Date  . Atrial flutter ([place])   . B12 deficiency 02/22/2012  . Closed right hip fracture, initial encounter ([place]) 04/06/2017  . Diabetes mellitus   . Hyperlipidemia   . Hypertension   . Ileus ([place]) 02/29/2012  . Malnutrition ([place]) 02/29/2012  . Neuropathy   . Non compliance with medical treatment 02/29/2012  . Pancreatitis 02/22/2012  . SBO (small bowel obstruction) ([place]) 02/23/2012  . Syncope and collapse     Past Surgical History:  Procedure Laterality Date  . BACK SURGERY    . COMPRESSION HIP SCREW Right 04/07/2017   Procedure: RIGHT HIP INTRATROCHANTERIC NAILING;  Surgeon: [name], MD;  Location: [facility];  Service: Orthopedics;  Laterality: Right;  . LAMINECTOMY  1965    Social History   Socioeconomic History  . Marital status: Widowed    Spouse name: Not on file  . Number of children: Y  . Years of education: Not on file  . Highest education level: Not on file  Social Needs  . Financial resource strain: Not on file  . Food insecurity - worry: Not on file  . Food insecurity - inability: Not on file  . Transportation needs - medical: Not on file  . Transportation needs - non-medical: Not on file  Occupational History  . Occupation: retired Research scientist (medical): RETIRED  Tobacco Use  . Smoking status: Current Every Day Smoker    Packs/day: 0.50    Years:  10.00    Pack years: 5.00    Types: Cigarettes  . Smokeless tobacco: Never Used  Substance and Sexual Activity  . Alcohol use: Yes    Alcohol/week: 21.6 oz    Types: 36 Cans of beer per week  . Drug use: No  . Sexual activity: No  Other Topics Concern  . Not on file  Social History Narrative  . Not on file   Family History  Problem Relation Age of Onset  . Hypertension Neg Hx   . Coronary artery disease Neg Hx   . Diabetes Neg Hx       VITAL SIGNS BP 128/67   Pulse 80   Temp 98.1 F (36.7 C)   Resp 20   Ht 6' (1.829 m)   Wt 247 lb 1.6 oz (112.1 kg)   SpO2 98%   BMI 33.51 kg/m   Outpatient Encounter Medications as of 10/10/2017  Medication Sig  . acetaminophen (TYLENOL) 500 MG tablet Take 1,000 mg by mouth 3 (three) times daily.  . Amino Acids-Protein Hydrolys (FEEDING SUPPLEMENT, PRO-STAT SUGAR FREE 64,) LIQD Take 30 mLs 2 (two) times daily by mouth.  . Cholecalciferol 50000 units TABS Give 1 tablet by mouth every Saturday  . docusate sodium (COLACE) 100 MG capsule Take 1 capsule (100 mg total) by mouth 2 (two)  times daily.  . ferrous sulfate 325 (65 FE) MG tablet Take 325 mg by mouth 2 (two) times daily with a meal.  . furosemide (LASIX) 20 MG tablet Take 20 mg by mouth daily.  Marland Kitchen gabapentin (NEURONTIN) 300 MG capsule Take 300 mg by mouth 2 (two) times daily.   Marland Kitchen HYDROcodone-acetaminophen (NORCO/VICODIN) 5-325 MG tablet Take 1 tablet by mouth every 6 (six) hours as needed for moderate pain.  Marland Kitchen insulin aspart (NOVOLOG) 100 UNIT/ML injection Inject as per sliding scale subcutaneously before meals 0 - 200 = 0 units 201 - 250 = 2 units 251 - 300 = 4 units 301 - 350 = 6 units 351 - 400 = 8 units. If BS is greater than 400 = 10 units. Push fluids and repeat in 2 hours.  If remains greater than 400 upon recheck, notify MD  . insulin glargine (LANTUS) 100 UNIT/ML injection Inject 40 Units at bedtime into the skin.   . Menthol-Zinc Oxide (CALMOSEPTINE) 0.44-20.6 % OINT  Apply to peri area topically every day and night shift for incontinence.  Apply after incontinent episodes  . metoprolol succinate (TOPROL-XL) 25 MG 24 hr tablet Take 25 mg by mouth daily.  . Multiple Vitamins-Minerals (DECUBI-VITE) CAPS Take 1 capsule by mouth daily.  . pantoprazole (PROTONIX) 40 MG tablet Take 40 mg by mouth daily.  . polyethylene glycol (MIRALAX / GLYCOLAX) packet Take 17 g by mouth 2 (two) times daily.  . simvastatin (ZOCOR) 20 MG tablet Take 20 mg by mouth at bedtime.  . Skin Protectants, Misc. (EUCERIN) cream Apply to BLE topically two times daily for dry skin  . traMADol (ULTRAM) 50 MG tablet Give 1/2 tablet (25mg ) by mouth two times daily  . traZODone (DESYREL) 100 MG tablet Take 100 mg at bedtime by mouth.   . [DISCONTINUED] ferrous sulfate 325 (65 FE) MG tablet Take 1 tablet (325 mg total) by mouth 2 (two) times daily with a meal.   No facility-administered encounter medications on file as of 10/10/2017.      SIGNIFICANT DIAGNOSTIC EXAMS  NONE RECENT  LABS REVIEWED: PREVIOUS:    04-10-17: wbc 7.4; hgb 8.7; hct 26.5; mcv 84.7; plt 165; glucose 211; bun 19; creat 1.45; k+ 4.0; na++ 136; ca 7.4; liver normal albumin 2.7   NO NEW LABS    Review of Systems  Constitutional: Negative for malaise/fatigue.  Respiratory: Negative for cough and shortness of breath.   Cardiovascular: Negative for chest pain, palpitations and leg swelling.  Gastrointestinal: Negative for abdominal pain, constipation and heartburn.  Musculoskeletal: Positive for myalgias. Negative for back pain and joint pain.       Has bilateral heel pain and neuropathic pain both feet.   Skin: Negative.   Neurological: Negative for dizziness.  Psychiatric/Behavioral: The patient is not nervous/anxious.     Physical Exam  Constitutional: He is oriented to person, place, and time. He appears well-developed and well-nourished. No distress.  Neck: No thyromegaly present.  Cardiovascular: Normal  rate, regular rhythm, normal heart sounds and intact distal pulses.  Pulmonary/Chest: Effort normal and breath sounds normal. No respiratory distress.  Abdominal: Soft. Bowel sounds are normal. He exhibits no distension. There is no tenderness.  Musculoskeletal: He exhibits edema.  Is able to move all extremities Has trace bilateral lower extremity edema   Lymphadenopathy:    He has no cervical adenopathy.  Neurological: He is alert and oriented to person, place, and time.  Skin: Skin is warm and dry. He is not diaphoretic.  Bilateral lower extremities discolored  2 blisters left heel 1 blister right heel Intact no redness present  Very tender to palpation   Psychiatric: He has a normal mood and affect.   ASSESSMENT/ PLAN:  TODAY:   1.  Diabetic peripheral neuropathy: is worse; will increase neurontin to 300 mg three times daily and will continue vicodin 5/325 mg every 6 hours as needed for pain  2. Bilateral heel blisters: treatment nurse made aware; will keep heels off bed all the time; and will keep pressure off feet while out of bed.    MD is aware of resident's narcotic use and is in agreement with current plan of care. We will attempt to wean resident as apropriate     Ok Edwards NP [facility]  Contact [phone] Monday through Friday 8am- 5pm  After hours call [phone]

## 2017-10-23 ENCOUNTER — Other Ambulatory Visit: Payer: Self-pay

## 2017-10-23 MED ORDER — HYDROCODONE-ACETAMINOPHEN 5-325 MG PO TABS: 1.0000 | ORAL_TABLET | Freq: Four times a day (QID) | ORAL | 0 refills | Status: DC | PRN

## 2017-10-23 NOTE — Telephone Encounter (Signed)
RX faxed to [facility] @ [phone], phone number [phone] 

## 2017-10-25 ENCOUNTER — Non-Acute Institutional Stay (SKILLED_NURSING_FACILITY): Payer: Medicare Other | Admitting: Adult Health

## 2017-10-25 ENCOUNTER — Encounter: Payer: Self-pay | Admitting: Adult Health

## 2017-10-25 DIAGNOSIS — E1142 Type 2 diabetes mellitus with diabetic polyneuropathy: Secondary | ICD-10-CM | POA: Insufficient documentation

## 2017-10-25 DIAGNOSIS — K219 Gastro-esophageal reflux disease without esophagitis: Secondary | ICD-10-CM

## 2017-10-25 DIAGNOSIS — I255 Ischemic cardiomyopathy: Secondary | ICD-10-CM | POA: Diagnosis not present

## 2017-10-25 DIAGNOSIS — R238 Other skin changes: Secondary | ICD-10-CM | POA: Insufficient documentation

## 2017-10-25 DIAGNOSIS — I10 Essential (primary) hypertension: Secondary | ICD-10-CM

## 2017-10-25 NOTE — Progress Notes (Signed)
Location:   [place] Room Number: 226 A Place of Service:  SNF (31)   CODE STATUS: DNR  No Known Allergies  Chief Complaint  Patient presents with  . Medical Management of Chronic Issues    Hypertension; ischemic cardiomyopathy; gerd; diabetic neuropathy    HPI:  He is an 84 year old long term resident of this facility being seen for the management of his chronic illnesses: hypertension; ischemic cardiomyopathy; gerd; diabetic neuropathy.  He is not voicing any complaints today; he denies any headaches; chest pain or shortness of breath. There are no nursing concerns at this time.    Past Medical History:  Diagnosis Date  . Atrial flutter ([place])   . B12 deficiency 02/22/2012  . Closed right hip fracture, initial encounter ([place]) 04/06/2017  . Diabetes mellitus   . Hyperlipidemia   . Hypertension   . Ileus ([place]) 02/29/2012  . Malnutrition ([place]) 02/29/2012  . Neuropathy   . Non compliance with medical treatment 02/29/2012  . Pancreatitis 02/22/2012  . SBO (small bowel obstruction) ([place]) 02/23/2012  . Syncope and collapse     Past Surgical History:  Procedure Laterality Date  . BACK SURGERY    . COMPRESSION HIP SCREW Right 04/07/2017   Procedure: RIGHT HIP INTRATROCHANTERIC NAILING;  Surgeon: [name], MD;  Location: [facility];  Service: Orthopedics;  Laterality: Right;  . LAMINECTOMY  1965    Social History   Socioeconomic History  . Marital status: Widowed    Spouse name: Not on file  . Number of children: Y  . Years of education: Not on file  . Highest education level: Not on file  Social Needs  . Financial resource strain: Not on file  . Food insecurity - worry: Not on file  . Food insecurity - inability: Not on file  . Transportation needs - medical: Not on file  . Transportation needs - non-medical: Not on file  Occupational History  . Occupation: retired Research scientist (medical): RETIRED  Tobacco Use  . Smoking status: Current Every Day  Smoker    Packs/day: 0.50    Years: 10.00    Pack years: 5.00    Types: Cigarettes  . Smokeless tobacco: Never Used  Substance and Sexual Activity  . Alcohol use: Yes    Alcohol/week: 21.6 oz    Types: 36 Cans of beer per week  . Drug use: No  . Sexual activity: No  Other Topics Concern  . Not on file  Social History Narrative  . Not on file   Family History  Problem Relation Age of Onset  . Hypertension Neg Hx   . Coronary artery disease Neg Hx   . Diabetes Neg Hx       VITAL SIGNS BP 134/68   Pulse 76   Temp (!) 97.1 F (36.2 C)   Resp 20   Ht 6' (1.829 m)   Wt 245 lb (111.1 kg)   SpO2 98%   BMI 33.23 kg/m    Outpatient Encounter Medications as of 10/25/2017  Medication Sig  . acetaminophen (TYLENOL) 500 MG tablet Take 1,000 mg by mouth 3 (three) times daily.  . Amino Acids-Protein Hydrolys (FEEDING SUPPLEMENT, PRO-STAT SUGAR FREE 64,) LIQD Take 30 mLs 2 (two) times daily by mouth.  Marland Kitchen atorvastatin (LIPITOR) 10 MG tablet Take 10 mg by mouth at bedtime.  . Cholecalciferol 50000 units TABS Give 1 tablet by mouth every Saturday  . docusate sodium (COLACE) 100 MG capsule Take 1  capsule (100 mg total) by mouth 2 (two) times daily.  . ferrous sulfate 325 (65 FE) MG tablet Take 325 mg by mouth 2 (two) times daily with a meal.  . furosemide (LASIX) 20 MG tablet Take 20 mg by mouth daily.  Marland Kitchen gabapentin (NEURONTIN) 300 MG capsule Take 300 mg by mouth 2 (two) times daily.   Marland Kitchen HYDROcodone-acetaminophen (NORCO/VICODIN) 5-325 MG tablet Take 1 tablet by mouth every 6 (six) hours as needed for moderate pain.  Marland Kitchen insulin aspart (NOVOLOG) 100 UNIT/ML injection Inject as per sliding scale subcutaneously before meals 0 - 200 = 0 units 201 - 250 = 2 units 251 - 300 = 4 units 301 - 350 = 6 units 351 - 400 = 8 units. If BS is greater than 400 = 10 units. Push fluids and repeat in 2 hours.  If remains greater than 400 upon recheck, notify MD  . insulin glargine (LANTUS) 100 UNIT/ML  injection Inject 40 Units at bedtime into the skin.   . Menthol-Zinc Oxide (CALMOSEPTINE) 0.44-20.6 % OINT Apply to peri area topically every day and night shift for incontinence.  Apply after incontinent episodes  . metoprolol succinate (TOPROL-XL) 25 MG 24 hr tablet Take 25 mg by mouth daily.  . Multiple Vitamins-Minerals (DECUBI-VITE) CAPS Take 1 capsule by mouth daily.  . pantoprazole (PROTONIX) 40 MG tablet Take 40 mg by mouth daily.  . polyethylene glycol (MIRALAX / GLYCOLAX) packet Take 17 g by mouth 2 (two) times daily.  . Skin Protectants, Misc. (EUCERIN) cream Apply to BLE topically two times daily for dry skin  . traMADol (ULTRAM) 50 MG tablet Give 1/2 tablet (25mg ) by mouth two times daily  . traZODone (DESYREL) 100 MG tablet Take 100 mg at bedtime by mouth.   . [DISCONTINUED] simvastatin (ZOCOR) 20 MG tablet Take 20 mg by mouth at bedtime.   No facility-administered encounter medications on file as of 10/25/2017.      SIGNIFICANT DIAGNOSTIC EXAMS   NONE RECENT  LABS REVIEWED: PREVIOUS:    04-10-17: wbc 7.4; hgb 8.7; hct 26.5; mcv 84.7; plt 165; glucose 211; bun 19; creat 1.45; k+ 4.0; na++ 136; ca 7.4; liver normal albumin 2.7   TODAY:   09-27-17: wbc 8.7; hgb 14.6; hct 43.2; mcv 83.5; plt 275; glucose 204; bun 14.5; creat 1.30; k+ 4.0; na++ 139; ca 8.8; liver normal albumin 3.6  10-08-17: urine micro-albumin 1.2   Review of Systems  Constitutional: Negative for malaise/fatigue.  Respiratory: Negative for cough and shortness of breath.   Cardiovascular: Negative for chest pain, palpitations and leg swelling.  Gastrointestinal: Negative for abdominal pain, constipation and heartburn.  Musculoskeletal: Negative for back pain, joint pain and myalgias.  Skin: Negative.   Neurological: Negative for dizziness.  Psychiatric/Behavioral: The patient is not nervous/anxious.      Physical Exam  Constitutional: He is oriented to person, place, and time. He appears  well-developed and well-nourished. No distress.  Neck: No thyromegaly present.  Cardiovascular: Normal rate, regular rhythm, normal heart sounds and intact distal pulses.  Pulmonary/Chest: Effort normal and breath sounds normal. No respiratory distress.  Abdominal: Soft. Bowel sounds are normal. He exhibits no distension. There is no tenderness.  Musculoskeletal: He exhibits no edema.  Is able to move all extremities   Lymphadenopathy:    He has no cervical adenopathy.  Neurological: He is alert and oriented to person, place, and time.  Skin: Skin is warm and dry. He is not diaphoretic.  Left medial heel: stage II fluid  filled blister: 2.8 x 2.3 cm Left lateral heel: DTI: 3.5 x 1.5 cm Right medial heel stage II: fluid filled blister: 2.5 x 3.0 cm Right lateral heel DTI: 6.0 x 3.0 cm  Bilateral lower extremities discolored   Psychiatric: He has a normal mood and affect.     ASSESSMENT/ PLAN:  TODAY:   1. Essential hypertension: b/p 134/68: will continue toprol xl 25 mg daily   2. Ischemic cardiomyopathy: stable will continue lasix 20 mg daily   3. GERD without esophagitis: stable will continue protonix 40 mg daily   4. Diabetic peripheral neuropathy: stable will continue neurontin 300 mg three times daily  Tylenol 1 gm three times daily; ultram 25 mg twice daily and vicodin 5/325 mg every 6 hours as needed  PREVIUS    5. Dyslipidemia associated with type 2 diabetes mellitus: ldl 117  stable will continue lipitor 10 mg nightly   6. Type II diabetes mellitus with renal manifestations uncontrolled: hgb a1c 9.9  cbgs remain elevated between 200-300. Will continue  lantus  45 units nightly and  novlog  6 units after meals.   7. Chronic constipation: stable will continue miralax twice daily and colace twice daily   8. Iron deficiency anemia: stable hgb 14.6 will lower iron to one time daily   9. CKD stage III: stable bun 14.5 and creat 1.30  10. Bilateral heel blisters and DTI:  without changes; will not make changes will monitor  11. Insomnia: stable will continue trazodone 100 mg nightly   12. Vit D deficiency: vit D level: 47.12 will stop vitamin d   MD is aware of resident's narcotic use and is in agreement with current plan of care. We will attempt to wean resident as apropriate   Ok Edwards NP [facility]  Contact [phone] Monday through Friday 8am- 5pm  After hours call [phone]

## 2017-10-28 DIAGNOSIS — F329 Major depressive disorder, single episode, unspecified: Secondary | ICD-10-CM | POA: Insufficient documentation

## 2017-10-28 DIAGNOSIS — F32A Depression, unspecified: Secondary | ICD-10-CM | POA: Insufficient documentation

## 2017-10-28 DIAGNOSIS — L89319 Pressure ulcer of right buttock, unspecified stage: Secondary | ICD-10-CM | POA: Insufficient documentation

## 2017-10-28 DIAGNOSIS — R600 Localized edema: Secondary | ICD-10-CM | POA: Insufficient documentation

## 2017-10-28 DIAGNOSIS — F99 Mental disorder, not otherwise specified: Secondary | ICD-10-CM

## 2017-10-28 DIAGNOSIS — F5105 Insomnia due to other mental disorder: Secondary | ICD-10-CM | POA: Insufficient documentation

## 2017-11-02 ENCOUNTER — Encounter: Payer: Self-pay | Admitting: Adult Health

## 2017-11-02 ENCOUNTER — Non-Acute Institutional Stay (SKILLED_NURSING_FACILITY): Payer: Medicare Other | Admitting: Adult Health

## 2017-11-02 ENCOUNTER — Other Ambulatory Visit: Payer: Self-pay

## 2017-11-02 DIAGNOSIS — E1140 Type 2 diabetes mellitus with diabetic neuropathy, unspecified: Secondary | ICD-10-CM

## 2017-11-02 DIAGNOSIS — E785 Hyperlipidemia, unspecified: Secondary | ICD-10-CM

## 2017-11-02 DIAGNOSIS — Z794 Long term (current) use of insulin: Secondary | ICD-10-CM

## 2017-11-02 DIAGNOSIS — E1169 Type 2 diabetes mellitus with other specified complication: Secondary | ICD-10-CM | POA: Diagnosis not present

## 2017-11-02 MED ORDER — TRAMADOL HCL 50 MG PO TABS: ORAL_TABLET | ORAL | 0 refills | Status: DC

## 2017-11-02 NOTE — Progress Notes (Signed)
Location:   [place] Room Number: 226 A Place of Service:  SNF (31)   CODE STATUS:  DNR  No Known Allergies  Chief Complaint  Patient presents with  . Acute Visit    elevated CBG readings    HPI:  His hgb a1c is 9.9. His cbg readings remain elevated between 200-400. There are no reports of excessive thirst or hunger; no reports of excessive urination. He states that he is feeling good. He denies any changes in vision; denies pain in legs and feet.   Past Medical History:  Diagnosis Date  . Atrial flutter ([place])   . B12 deficiency 02/22/2012  . Closed right hip fracture, initial encounter ([place]) 04/06/2017  . Diabetes mellitus   . Hyperlipidemia   . Hypertension   . Ileus ([place]) 02/29/2012  . Malnutrition ([place]) 02/29/2012  . Neuropathy   . Non compliance with medical treatment 02/29/2012  . Pancreatitis 02/22/2012  . SBO (small bowel obstruction) ([place]) 02/23/2012  . Syncope and collapse     Past Surgical History:  Procedure Laterality Date  . BACK SURGERY    . COMPRESSION HIP SCREW Right 04/07/2017   Procedure: RIGHT HIP INTRATROCHANTERIC NAILING;  Surgeon: [name], MD;  Location: [facility];  Service: Orthopedics;  Laterality: Right;  . LAMINECTOMY  1965    Social History   Socioeconomic History  . Marital status: Widowed    Spouse name: Not on file  . Number of children: Y  . Years of education: Not on file  . Highest education level: Not on file  Social Needs  . Financial resource strain: Not on file  . Food insecurity - worry: Not on file  . Food insecurity - inability: Not on file  . Transportation needs - medical: Not on file  . Transportation needs - non-medical: Not on file  Occupational History  . Occupation: retired Research scientist (medical): RETIRED  Tobacco Use  . Smoking status: Current Every Day Smoker    Packs/day: 0.50    Years: 10.00    Pack years: 5.00    Types: Cigarettes  . Smokeless tobacco: Never Used  Substance and  Sexual Activity  . Alcohol use: Yes    Alcohol/week: 21.6 oz    Types: 36 Cans of beer per week  . Drug use: No  . Sexual activity: No  Other Topics Concern  . Not on file  Social History Narrative  . Not on file   Family History  Problem Relation Age of Onset  . Hypertension Neg Hx   . Coronary artery disease Neg Hx   . Diabetes Neg Hx       VITAL SIGNS BP 132/88   Pulse 80   Temp (!) 97.1 F (36.2 C)   Resp 20   Ht 6' (1.829 m)   Wt 245 lb (111.1 kg)   SpO2 98%   BMI 33.23 kg/m   Outpatient Encounter Medications as of 11/02/2017  Medication Sig  . acetaminophen (TYLENOL) 500 MG tablet Take 1,000 mg by mouth 3 (three) times daily.  . Amino Acids-Protein Hydrolys (FEEDING SUPPLEMENT, PRO-STAT SUGAR FREE 64,) LIQD Take 30 mLs 2 (two) times daily by mouth.  Marland Kitchen atorvastatin (LIPITOR) 10 MG tablet Take 10 mg by mouth at bedtime.  . Cholecalciferol 50000 units TABS Give 1 tablet by mouth every Saturday  . docusate sodium (COLACE) 100 MG capsule Take 1 capsule (100 mg total) by mouth 2 (two) times daily.  . ferrous sulfate 325 (  65 FE) MG tablet Take 325 mg by mouth 2 (two) times daily with a meal.  . furosemide (LASIX) 20 MG tablet Take 20 mg by mouth daily.  Marland Kitchen gabapentin (NEURONTIN) 300 MG capsule Take 300 mg by mouth 3 (three) times daily.   Marland Kitchen HYDROcodone-acetaminophen (NORCO/VICODIN) 5-325 MG tablet Take 1 tablet by mouth every 6 (six) hours as needed for moderate pain.  Marland Kitchen insulin aspart (NOVOLOG) 100 UNIT/ML injection Inject 6 units subcutaneously after meals  . insulin glargine (LANTUS) 100 UNIT/ML injection Inject 45 Units into the skin at bedtime.   . Menthol-Zinc Oxide (CALMOSEPTINE) 0.44-20.6 % OINT Apply to peri area topically every day and night shift for incontinence.  Apply after incontinent episodes  . metoprolol succinate (TOPROL-XL) 25 MG 24 hr tablet Take 25 mg by mouth daily.  . Multiple Vitamins-Minerals (DECUBI-VITE) CAPS Take 1 capsule by mouth daily.  .  pantoprazole (PROTONIX) 40 MG tablet Take 40 mg by mouth daily.  . polyethylene glycol (MIRALAX / GLYCOLAX) packet Take 17 g by mouth 2 (two) times daily.  . Skin Protectants, Misc. (EUCERIN) cream Apply to BLE topically two times daily for dry skin  . traMADol (ULTRAM) 50 MG tablet Give 1/2 tablet (25mg ) by mouth two times daily  . traZODone (DESYREL) 100 MG tablet Take 100 mg at bedtime by mouth.    No facility-administered encounter medications on file as of 11/02/2017.      SIGNIFICANT DIAGNOSTIC EXAMS   LABS REVIEWED: PREVIOUS:    04-10-17: wbc 7.4; hgb 8.7; hct 26.5; mcv 84.7; plt 165; glucose 211; bun 19; creat 1.45; k+ 4.0; na++ 136; ca 7.4; liver normal albumin 2.7   TODAY:   09-27-17: wbc 8.7; hgb 14.6; hct 43.2; mcv 83.5; plt 275; glucose 204; bun 14.5; creat 1.30; k+ 4.0; na++ 139; ca 8.8; liver normal albumin 3.6 hgb a1c 9.9; chol 102; ldl 52; trig 117 hdl 27; vit D 47.12   10-08-17: urine micro-albumin 1.2   Review of Systems  Constitutional: Negative for malaise/fatigue.  Respiratory: Negative for cough and shortness of breath.   Cardiovascular: Negative for chest pain, palpitations and leg swelling.  Gastrointestinal: Negative for abdominal pain, constipation and heartburn.  Musculoskeletal: Negative for back pain, joint pain and myalgias.  Skin: Negative.   Neurological: Negative for dizziness.  Psychiatric/Behavioral: The patient is not nervous/anxious.        Physical Exam  Constitutional: He is oriented to person, place, and time. He appears well-developed and well-nourished. No distress.  Neck: No thyromegaly present.  Cardiovascular: Normal rate, regular rhythm and intact distal pulses.  Murmur heard. 1/6  Pulmonary/Chest: Effort normal and breath sounds normal. No respiratory distress.  Abdominal: Soft. Bowel sounds are normal. He exhibits no distension.  Musculoskeletal: He exhibits no edema.  Is able to move all extremities   Neurological: He is  alert and oriented to person, place, and time.  Skin: Skin is warm and dry. He is not diaphoretic.  Right heel: unstaged: 7.7 x 8.0 x NM cm Left medial heel: unstaged: 1.7 x 1.2 x NM cm Left lateral heel: unstaged: 1.7 x 1.5 x NM cm  Bilateral lower extremities discolored   Psychiatric: He has a normal mood and affect.      ASSESSMENT/ PLAN:  TODAY:   1. Dyslipidemia associated with type 2 diabetes mellitus: ldl 52  stable will continue lipitor 10 mg nightly   2. Type II diabetes mellitus with renal manifestations uncontrolled: hgb a1c 9.9  cbgs remain elevated between 200-300.  Will change to   lantus 52 units nightly and  novlog 11 units after meals.  Urine micro-albumin 1.2   MD is aware of resident's narcotic use and is in agreement with current plan of care. We will attempt to wean resident as apropriate   Ok Edwards NP [facility]  Contact [phone] Monday through Friday 8am- 5pm  After hours call [phone]

## 2017-11-02 NOTE — Telephone Encounter (Signed)
RX faxed to [facility] @ [phone], phone number [phone] 

## 2017-11-07 ENCOUNTER — Encounter: Payer: Self-pay | Admitting: Adult Health

## 2017-11-07 NOTE — Progress Notes (Signed)
Location:   [place] Room Number: 226 A Place of Service:  SNF (31)   CODE STATUS: DNR  No Known Allergies  Chief Complaint  Patient presents with  . Acute Visit    Patient status    HPI:    Past Medical History:  Diagnosis Date  . Atrial flutter ([place])   . B12 deficiency 02/22/2012  . Closed right hip fracture, initial encounter ([place]) 04/06/2017  . Diabetes mellitus   . Hyperlipidemia   . Hypertension   . Ileus ([place]) 02/29/2012  . Malnutrition ([place]) 02/29/2012  . Neuropathy   . Non compliance with medical treatment 02/29/2012  . Pancreatitis 02/22/2012  . SBO (small bowel obstruction) ([place]) 02/23/2012  . Syncope and collapse     Past Surgical History:  Procedure Laterality Date  . BACK SURGERY    . COMPRESSION HIP SCREW Right 04/07/2017   Procedure: RIGHT HIP INTRATROCHANTERIC NAILING;  Surgeon: [name], MD;  Location: [place];  Service: Orthopedics;  Laterality: Right;  . LAMINECTOMY  1965    Social History   Socioeconomic History  . Marital status: Widowed    Spouse name: Not on file  . Number of children: Y  . Years of education: Not on file  . Highest education level: Not on file  Social Needs  . Financial resource strain: Not on file  . Food insecurity - worry: Not on file  . Food insecurity - inability: Not on file  . Transportation needs - medical: Not on file  . Transportation needs - non-medical: Not on file  Occupational History  . Occupation: retired Research scientist (medical): RETIRED  Tobacco Use  . Smoking status: Current Every Day Smoker    Packs/day: 0.50    Years: 10.00    Pack years: 5.00    Types: Cigarettes  . Smokeless tobacco: Never Used  Substance and Sexual Activity  . Alcohol use: Yes    Alcohol/week: 21.6 oz    Types: 36 Cans of beer per week  . Drug use: No  . Sexual activity: No  Other Topics Concern  . Not on file  Social History Narrative  . Not on file   Family History  Problem Relation Age of  Onset  . Hypertension Neg Hx   . Coronary artery disease Neg Hx   . Diabetes Neg Hx       VITAL SIGNS BP 121/63   Pulse 79   Temp (!) 96.4 F (35.8 C)   Resp 18   Ht 6' (1.829 m)   Wt 247 lb 3.2 oz (112.1 kg)   SpO2 98%   BMI 33.53 kg/m   Outpatient Encounter Medications as of 11/07/2017  Medication Sig  . acetaminophen (TYLENOL) 500 MG tablet Take 1,000 mg by mouth 3 (three) times daily.  . Amino Acids-Protein Hydrolys (FEEDING SUPPLEMENT, PRO-STAT SUGAR FREE 64,) LIQD Take 30 mLs 2 (two) times daily by mouth.  Marland Kitchen atorvastatin (LIPITOR) 10 MG tablet Take 10 mg by mouth at bedtime.  . Cholecalciferol 50000 units TABS Give 1 tablet by mouth every Saturday  . docusate sodium (COLACE) 100 MG capsule Take 1 capsule (100 mg total) by mouth 2 (two) times daily.  . ferrous sulfate 325 (65 FE) MG tablet Take 325 mg by mouth daily with breakfast.   . furosemide (LASIX) 20 MG tablet Take 20 mg by mouth daily.  Marland Kitchen gabapentin (NEURONTIN) 300 MG capsule Take 300 mg by mouth 3 (three) times daily.   Marland Kitchen  HYDROcodone-acetaminophen (NORCO/VICODIN) 5-325 MG tablet Take 1 tablet by mouth every 6 (six) hours as needed for moderate pain.  Marland Kitchen insulin aspart (NOVOLOG) 100 UNIT/ML injection Inject 11 units subcutaneously after meals  . insulin glargine (LANTUS) 100 UNIT/ML injection Inject 52 Units into the skin at bedtime.   . metoprolol succinate (TOPROL-XL) 25 MG 24 hr tablet Take 25 mg by mouth daily.  . Multiple Vitamins-Minerals (DECUBI-VITE) CAPS Take 1 capsule by mouth daily.  . pantoprazole (PROTONIX) 40 MG tablet Take 40 mg by mouth daily.  . polyethylene glycol (MIRALAX / GLYCOLAX) packet Take 17 g by mouth 2 (two) times daily.  . Skin Protectants, Misc. (CALAZIME SKIN PROTECTANT EX) Apply to buttock every shift  . Skin Protectants, Misc. (EUCERIN) cream Apply to BLE topically two times daily for dry skin  . traMADol (ULTRAM) 50 MG tablet Give 1/2 tablet (25mg ) by mouth two times daily  .  traZODone (DESYREL) 100 MG tablet Take 100 mg at bedtime by mouth.   . [DISCONTINUED] Menthol-Zinc Oxide (CALMOSEPTINE) 0.44-20.6 % OINT Apply to peri area topically every day and night shift for incontinence.  Apply after incontinent episodes   No facility-administered encounter medications on file as of 11/07/2017.      SIGNIFICANT DIAGNOSTIC EXAMS       ASSESSMENT/ PLAN:    MD is aware of resident's narcotic use and is in agreement with current plan of care. We will attempt to wean resident as apropriate   Ok Edwards NP [facility]  Contact [phone] Monday through Friday 8am- 5pm  After hours call [phone]

## 2017-11-08 ENCOUNTER — Non-Acute Institutional Stay (SKILLED_NURSING_FACILITY): Payer: Medicare Other | Admitting: Adult Health

## 2017-11-08 ENCOUNTER — Encounter: Payer: Self-pay | Admitting: Adult Health

## 2017-11-08 DIAGNOSIS — Z20828 Contact with and (suspected) exposure to other viral communicable diseases: Secondary | ICD-10-CM | POA: Diagnosis not present

## 2017-11-08 NOTE — Progress Notes (Signed)
Location:   [facility] Room Number: 226 A Place of Service:  SNF (31)   CODE STATUS: DNR  No Known Allergies  Chief Complaint  Patient presents with  . Acute Visit    Flu like symptoms    HPI:  He has been exposed to influenza A. There has been an outbreak on this unit. There are no reports of fevers; changes in appetite; no reports of body aches. He tells me that he is feeling ok; he denies any cough or shortness of breath.   Past Medical History:  Diagnosis Date  . Atrial flutter ([place])   . B12 deficiency 02/22/2012  . Closed right hip fracture, initial encounter ([place]) 04/06/2017  . Diabetes mellitus   . Hyperlipidemia   . Hypertension   . Ileus ([place]) 02/29/2012  . Malnutrition ([place]) 02/29/2012  . Neuropathy   . Non compliance with medical treatment 02/29/2012  . Pancreatitis 02/22/2012  . SBO (small bowel obstruction) ([place]) 02/23/2012  . Syncope and collapse     Past Surgical History:  Procedure Laterality Date  . BACK SURGERY    . COMPRESSION HIP SCREW Right 04/07/2017   Procedure: RIGHT HIP INTRATROCHANTERIC NAILING;  Surgeon: [name], MD;  Location: [facility];  Service: Orthopedics;  Laterality: Right;  . LAMINECTOMY  1965    Social History   Socioeconomic History  . Marital status: Widowed    Spouse name: Not on file  . Number of children: Y  . Years of education: Not on file  . Highest education level: Not on file  Social Needs  . Financial resource strain: Not on file  . Food insecurity - worry: Not on file  . Food insecurity - inability: Not on file  . Transportation needs - medical: Not on file  . Transportation needs - non-medical: Not on file  Occupational History  . Occupation: retired Research scientist (medical): RETIRED  Tobacco Use  . Smoking status: Current Every Day Smoker    Packs/day: 0.50    Years: 10.00    Pack years: 5.00    Types: Cigarettes  . Smokeless tobacco: Never Used  Substance and Sexual Activity  . Alcohol  use: Yes    Alcohol/week: 21.6 oz    Types: 36 Cans of beer per week  . Drug use: No  . Sexual activity: No  Other Topics Concern  . Not on file  Social History Narrative  . Not on file   Family History  Problem Relation Age of Onset  . Hypertension Neg Hx   . Coronary artery disease Neg Hx   . Diabetes Neg Hx       VITAL SIGNS BP 121/63   Pulse 79   Temp (!) 96.4 F (35.8 C)   Resp 18   Ht 6' (1.829 m)   Wt 247 lb 3.2 oz (112.1 kg)   SpO2 98%   BMI 33.53 kg/m    Outpatient Encounter Medications as of 11/08/2017  Medication Sig  . acetaminophen (TYLENOL) 500 MG tablet Take 1,000 mg by mouth 3 (three) times daily.  . Amino Acids-Protein Hydrolys (FEEDING SUPPLEMENT, PRO-STAT SUGAR FREE 64,) LIQD Take 30 mLs 2 (two) times daily by mouth.  Marland Kitchen atorvastatin (LIPITOR) 10 MG tablet Take 10 mg by mouth at bedtime.  . Cholecalciferol 50000 units TABS Give 1 tablet by mouth every Saturday  . docusate sodium (COLACE) 100 MG capsule Take 1 capsule (100 mg total) by mouth 2 (two) times daily.  . ferrous  sulfate 325 (65 FE) MG tablet Take 325 mg by mouth daily with breakfast.   . furosemide (LASIX) 20 MG tablet Take 20 mg by mouth daily.  Marland Kitchen gabapentin (NEURONTIN) 300 MG capsule Take 300 mg by mouth 3 (three) times daily.   Marland Kitchen HYDROcodone-acetaminophen (NORCO/VICODIN) 5-325 MG tablet Take 1 tablet by mouth every 6 (six) hours as needed for moderate pain.  Marland Kitchen insulin aspart (NOVOLOG) 100 UNIT/ML injection Inject 11 units subcutaneously after meals  . insulin glargine (LANTUS) 100 UNIT/ML injection Inject 52 Units into the skin at bedtime.   . metoprolol succinate (TOPROL-XL) 25 MG 24 hr tablet Take 25 mg by mouth daily.  . Multiple Vitamins-Minerals (DECUBI-VITE) CAPS Take 1 capsule by mouth daily.  . pantoprazole (PROTONIX) 40 MG tablet Take 40 mg by mouth daily.  . polyethylene glycol (MIRALAX / GLYCOLAX) packet Take 17 g by mouth 2 (two) times daily.  . Skin Protectants, Misc.  (CALAZIME SKIN PROTECTANT EX) Apply to buttock every shift  . Skin Protectants, Misc. (EUCERIN) cream Apply to BLE topically two times daily for dry skin  . traMADol (ULTRAM) 50 MG tablet Give 1/2 tablet (25mg ) by mouth two times daily  . traZODone (DESYREL) 100 MG tablet Take 100 mg at bedtime by mouth.    No facility-administered encounter medications on file as of 11/08/2017.      SIGNIFICANT DIAGNOSTIC EXAMS  LABS REVIEWED: PREVIOUS:    04-10-17: wbc 7.4; hgb 8.7; hct 26.5; mcv 84.7; plt 165; glucose 211; bun 19; creat 1.45; k+ 4.0; na++ 136; ca 7.4; liver normal albumin 2.7  09-27-17: wbc 8.7; hgb 14.6; hct 43.2; mcv 83.5; plt 275; glucose 204; bun 14.5; creat 1.30; k+ 4.0; na++ 139; ca 8.8; liver normal albumin 3.6 hgb a1c 9.9; chol 102; ldl 52; trig 117 hdl 27; vit D 47.12   10-08-17: urine micro-albumin 1.2  NO NEW LABS    Review of Systems  Constitutional: Negative for malaise/fatigue.  Respiratory: Negative for cough and shortness of breath.   Cardiovascular: Negative for chest pain, palpitations and leg swelling.  Gastrointestinal: Negative for abdominal pain, constipation and heartburn.  Musculoskeletal: Negative for back pain, joint pain and myalgias.  Skin: Negative.   Neurological: Negative for dizziness.  Psychiatric/Behavioral: The patient is not nervous/anxious.      Physical Exam  Constitutional: He is oriented to person, place, and time. He appears well-developed and well-nourished. No distress.  Neck: No thyromegaly present.  Cardiovascular: Normal rate, regular rhythm and intact distal pulses.  Murmur heard. 1/6  Pulmonary/Chest: Effort normal and breath sounds normal. No respiratory distress.  Abdominal: Soft. Bowel sounds are normal. He exhibits no distension. There is no tenderness.  Musculoskeletal: He exhibits no edema.  Is able to move all extremities   Lymphadenopathy:    He has no cervical adenopathy.  Neurological: He is alert and oriented to  person, place, and time.  Skin: Skin is warm and dry. He is not diaphoretic.  Right heel: unstaged: 7.7 x 8.0 x NM cm Left medial heel: unstaged: 1.7 x 1.2 x NM cm Left lateral heel: unstaged: 1.7 x 1.5 x NM cm  Bilateral lower extremities discolored    Psychiatric: He has a normal mood and affect.     ASSESSMENT/ PLAN:  TODAY:   1. Exposure for influenza: will begin tamiflu 75 mg daily for 14 days and will continue to monitor his status   MD is aware of resident's narcotic use and is in agreement with current plan of care.  We will attempt to wean resident as apropriate   Ok Edwards NP [facility]  Contact [phone] Monday through Friday 8am- 5pm  After hours call [phone]

## 2017-11-12 ENCOUNTER — Non-Acute Institutional Stay (SKILLED_NURSING_FACILITY): Payer: Medicare Other | Admitting: Internal Medicine

## 2017-11-12 ENCOUNTER — Encounter: Payer: Self-pay | Admitting: Internal Medicine

## 2017-11-12 DIAGNOSIS — Z794 Long term (current) use of insulin: Secondary | ICD-10-CM | POA: Diagnosis not present

## 2017-11-12 DIAGNOSIS — E1140 Type 2 diabetes mellitus with diabetic neuropathy, unspecified: Secondary | ICD-10-CM

## 2017-11-12 DIAGNOSIS — J9801 Acute bronchospasm: Secondary | ICD-10-CM | POA: Diagnosis not present

## 2017-11-12 DIAGNOSIS — I255 Ischemic cardiomyopathy: Secondary | ICD-10-CM | POA: Diagnosis not present

## 2017-11-12 NOTE — Progress Notes (Signed)
Patient ID: Preston Moore, male   DOB: November 18, 1933, 84 y.o.   MRN: [id number]  Location:  [facility]   Place of Service:  SNF (31) Provider:  DR Mertis Mosher Patrici Ranks, MD  Patient Care Team: Lajean Manes, MD as PCP - General (Internal Medicine)  Extended Emergency Contact Information Primary Emergency Contact: Preston Moore,Preston Moore Address: [address]          [place], [place] [address] [name] of [place] Phone: [phone] Relation: Daughter Secondary Emergency Contact: Preston Moore Address: [address], [address] [place], [place] [address] [name] of [place] Phone: [phone] Work Phone: [phone] Mobile Phone: [phone] Relation: Daughter  Code Status:  Goals of care: Advanced Directive information Advanced Directives 11/08/2017  Does Patient Have a Medical Advance Directive? Yes  Type of Advance Directive Out of facility DNR (pink MOST or yellow form)  Does patient want to make changes to medical advance directive? No - Patient declined  Copy of [place] in Chart? -  Would patient like information on creating a medical advance directive? No - Patient declined  Pre-existing out of facility DNR order (yellow form or pink MOST form) Yellow form placed in chart (order not valid for inpatient use)     Chief Complaint  Patient presents with  . Acute Visit    cough and congestion    HPI:  Pt is a 84 y.o. male long term resident seen today for an acute visit for cough and congestion x few days. He is taking Tamiflu for exposure to influenza. CBG 341. He c/o cough that interrupts sleep and low grade temp. Appetite reduced. He has hx uncontrolled DM with A1c 9.9%. He also has ischemic heart disease and takes lasix daily. No recent wt gain   Past Medical History:  Diagnosis Date  . Atrial flutter ([place])   . B12 deficiency 02/22/2012  . Closed right hip fracture, initial encounter ([place]) 04/06/2017  .  Diabetes mellitus   . Hyperlipidemia   . Hypertension   . Ileus ([place]) 02/29/2012  . Malnutrition ([place]) 02/29/2012  . Neuropathy   . Non compliance with medical treatment 02/29/2012  . Pancreatitis 02/22/2012  . SBO (small bowel obstruction) ([place]) 02/23/2012  . Syncope and collapse    Past Surgical History:  Procedure Laterality Date  . BACK SURGERY    . COMPRESSION HIP SCREW Right 04/07/2017   Procedure: RIGHT HIP INTRATROCHANTERIC NAILING;  Surgeon: [name], MD;  Location: [facility];  Service: Orthopedics;  Laterality: Right;  . LAMINECTOMY  1965    No Known Allergies  Outpatient Encounter Medications as of 11/12/2017  Medication Sig  . acetaminophen (TYLENOL) 500 MG tablet Take 1,000 mg by mouth 3 (three) times daily.  . Amino Acids-Protein Hydrolys (FEEDING SUPPLEMENT, PRO-STAT SUGAR FREE 64,) LIQD Take 30 mLs 2 (two) times daily by mouth.  Marland Kitchen atorvastatin (LIPITOR) 10 MG tablet Take 10 mg by mouth at bedtime.  . Cholecalciferol 50000 units TABS Give 1 tablet by mouth every Saturday  . docusate sodium (COLACE) 100 MG capsule Take 1 capsule (100 mg total) by mouth 2 (two) times daily.  . ferrous sulfate 325 (65 FE) MG tablet Take 325 mg by mouth daily with breakfast.   . furosemide (LASIX) 20 MG tablet Take 20 mg by mouth daily.  Marland Kitchen gabapentin (NEURONTIN) 300 MG capsule Take 300 mg by mouth 3 (three) times daily.   Marland Kitchen HYDROcodone-acetaminophen (NORCO/VICODIN) 5-325  MG tablet Take 1 tablet by mouth every 6 (six) hours as needed for moderate pain.  Marland Kitchen insulin aspart (NOVOLOG) 100 UNIT/ML injection Inject 11 units subcutaneously after meals  . insulin glargine (LANTUS) 100 UNIT/ML injection Inject 52 Units into the skin at bedtime.   . metoprolol succinate (TOPROL-XL) 25 MG 24 hr tablet Take 25 mg by mouth daily.  . Multiple Vitamins-Minerals (DECUBI-VITE) CAPS Take 1 capsule by mouth daily.  . pantoprazole (PROTONIX) 40 MG tablet Take 40 mg by mouth daily.  . polyethylene glycol (MIRALAX  / GLYCOLAX) packet Take 17 g by mouth 2 (two) times daily.  . Skin Protectants, Misc. (CALAZIME SKIN PROTECTANT EX) Apply to buttock every shift  . Skin Protectants, Misc. (EUCERIN) cream Apply to BLE topically two times daily for dry skin  . traMADol (ULTRAM) 50 MG tablet Give 1/2 tablet (25mg ) by mouth two times daily  . traZODone (DESYREL) 100 MG tablet Take 100 mg at bedtime by mouth.    No facility-administered encounter medications on file as of 11/12/2017.     Review of Systems  Constitutional: Positive for appetite change (reduced).  Respiratory: Positive for cough and shortness of breath.   All other systems reviewed and are negative.    There is no immunization history on file for this patient. Pertinent  Health Maintenance Due  Topic Date Due  . FOOT EXAM  08/21/2018 (Originally 09/14/1944)  . INFLUENZA VACCINE  09/25/2018 (Originally 05/16/2017)  . OPHTHALMOLOGY EXAM  09/25/2018 (Originally 09/14/1944)  . PNA vac Low Risk Adult (1 of 2 - PCV13) 09/25/2018 (Originally 09/14/2001)  . HEMOGLOBIN A1C  03/28/2018  . URINE MICROALBUMIN  10/08/2018   No flowsheet data found. Functional Status Survey:    Vitals:   11/12/17 2319  BP: 116/78  Pulse: (!) 102  Temp: 99.1 F (37.3 C)  SpO2: (!) 87%  Weight: 247 lb 3.2 oz (112.1 kg)   Body mass index is 33.53 kg/m. Physical Exam  Constitutional: He is oriented to person, place, and time. He appears well-developed and well-nourished.  Sitting up in bed in NAD, no conversational dyspnea  HENT:  Mouth/Throat: Oropharynx is clear and moist.  MMM; no oral thrush  Eyes: Pupils are equal, round, and reactive to light. No scleral icterus.  Neck: Neck supple. Carotid bruit is not present. No thyromegaly present.  Cardiovascular: Normal rate and intact distal pulses. An irregularly irregular rhythm present. Exam reveals no gallop and no friction rub.  Murmur heard.  Systolic murmur is present with a grade of 1/6. No distal LE  edema. No calf TTP  Pulmonary/Chest: Effort normal. He has wheezes (end expiratory with prolonged phase). He has no rales. He exhibits no tenderness.  Abdominal: Soft. Normal appearance and bowel sounds are normal. He exhibits no distension, no abdominal bruit, no pulsatile midline mass and no mass. There is no hepatomegaly. There is no tenderness. There is no rigidity, no rebound and no guarding. No hernia.  Musculoskeletal: He exhibits edema.  Lymphadenopathy:    He has no cervical adenopathy.  Neurological: He is alert and oriented to person, place, and time.  Skin: Skin is warm and dry. No rash noted.  dsg c/d/i  Psychiatric: He has a normal mood and affect. His behavior is normal. Judgment and thought content normal.    Labs reviewed: Recent Labs    04/08/17 0359 04/09/17 0446 04/10/17 0611 09/27/17  NA 135 136 136 139  K 4.4 4.2 4.0 4.0  CL 107 105 107  --  CO2 22 22 22   --   GLUCOSE 211* 210* 211*  --   BUN 16 21* 19 15  CREATININE 1.43* 1.72* 1.45* 1.3  CALCIUM 7.8* 7.9* 7.9*  --    Recent Labs    04/08/17 0359 04/09/17 0446 04/10/17 0611 09/27/17  AST 15 31 23  13*  ALT 10* 8* 6* 11  ALKPHOS 60 62 59 105  BILITOT 0.7 0.8 0.7  --   PROT 5.6* 6.1* 5.4*  --   ALBUMIN 3.2* 3.0* 2.7*  --    Recent Labs    04/06/17 1636 04/08/17 0359 04/09/17 0446 04/10/17 0611 09/27/17  WBC 10.9* 8.8 10.0 7.4 8.7  NEUTROABS 7.9*  --   --   --   --   HGB 13.9 10.2* 9.6* 8.7* 14.6  HCT 41.3 31.4* 30.2* 26.5* 43  MCV 83.9 86.0 86.5 84.7  --   PLT 230 166 160 165 275   Lab Results  Component Value Date   TSH 2.236 02/19/2012   Lab Results  Component Value Date   HGBA1C 9.9 09/27/2017   Lab Results  Component Value Date   CHOL 102 09/27/2017   HDL 27 (A) 09/27/2017   LDLCALC 52 09/27/2017   TRIG 117 09/27/2017    Significant Diagnostic Results in last 30 days:  No results found.  Assessment/Plan   ICD-10-CM   1. Cough due to bronchospasm J98.01   2. Type 2  diabetes mellitus with diabetic neuropathy, with long-term current use of insulin (HCC) E11.40    Z79.4   3. Ischemic cardiomyopathy I25.5     START MUCINEX 1200MG  Q12 HRS X 2 WEEKS  START ALBUTEROL NEB Q6HR PRN COUGH, WHEEZE, SOB - 1st dose now  Cont other meds as ordered  May need abx (eg, levaquin, zpak, rocephin, etc) +/- steroid taper pending xray results  Cont other meds as ordered  Will follow   Labs/tests ordered: STAT CXR 2 view  Rielynn Trulson S. Perlie Gold  [facility] [address] [place],  [address] [phone] Cell (Monday-Friday 8 AM - 5 PM) [phone] After 5 PM and follow prompts

## 2017-11-14 ENCOUNTER — Emergency Department (HOSPITAL_COMMUNITY): Payer: Medicare Other

## 2017-11-14 ENCOUNTER — Inpatient Hospital Stay (HOSPITAL_COMMUNITY)
Admission: EM | Admit: 2017-11-14 | Discharge: 2017-11-17 | DRG: 871 | Disposition: A | Discharge status: Skilled Nursing Facility | Payer: Medicare Other | Attending: Internal Medicine | Admitting: Internal Medicine

## 2017-11-14 DIAGNOSIS — N183 Chronic kidney disease, stage 3 unspecified: Secondary | ICD-10-CM | POA: Diagnosis present

## 2017-11-14 DIAGNOSIS — J1000 Influenza due to other identified influenza virus with unspecified type of pneumonia: Secondary | ICD-10-CM | POA: Diagnosis present

## 2017-11-14 DIAGNOSIS — R059 Cough, unspecified: Secondary | ICD-10-CM

## 2017-11-14 DIAGNOSIS — D509 Iron deficiency anemia, unspecified: Secondary | ICD-10-CM | POA: Diagnosis present

## 2017-11-14 DIAGNOSIS — E669 Obesity, unspecified: Secondary | ICD-10-CM | POA: Diagnosis present

## 2017-11-14 DIAGNOSIS — E1165 Type 2 diabetes mellitus with hyperglycemia: Secondary | ICD-10-CM | POA: Diagnosis present

## 2017-11-14 DIAGNOSIS — K5909 Other constipation: Secondary | ICD-10-CM | POA: Diagnosis present

## 2017-11-14 DIAGNOSIS — Z794 Long term (current) use of insulin: Secondary | ICD-10-CM

## 2017-11-14 DIAGNOSIS — E1122 Type 2 diabetes mellitus with diabetic chronic kidney disease: Secondary | ICD-10-CM | POA: Diagnosis present

## 2017-11-14 DIAGNOSIS — Z79899 Other long term (current) drug therapy: Secondary | ICD-10-CM

## 2017-11-14 DIAGNOSIS — A419 Sepsis, unspecified organism: Secondary | ICD-10-CM | POA: Diagnosis present

## 2017-11-14 DIAGNOSIS — E785 Hyperlipidemia, unspecified: Secondary | ICD-10-CM | POA: Diagnosis present

## 2017-11-14 DIAGNOSIS — I5042 Chronic combined systolic (congestive) and diastolic (congestive) heart failure: Secondary | ICD-10-CM | POA: Diagnosis present

## 2017-11-14 DIAGNOSIS — G9341 Metabolic encephalopathy: Secondary | ICD-10-CM | POA: Diagnosis present

## 2017-11-14 DIAGNOSIS — R0902 Hypoxemia: Secondary | ICD-10-CM

## 2017-11-14 DIAGNOSIS — IMO0002 Reserved for concepts with insufficient information to code with codable children: Secondary | ICD-10-CM | POA: Diagnosis present

## 2017-11-14 DIAGNOSIS — I255 Ischemic cardiomyopathy: Secondary | ICD-10-CM | POA: Diagnosis present

## 2017-11-14 DIAGNOSIS — I130 Hypertensive heart and chronic kidney disease with heart failure and stage 1 through stage 4 chronic kidney disease, or unspecified chronic kidney disease: Secondary | ICD-10-CM | POA: Diagnosis present

## 2017-11-14 DIAGNOSIS — I509 Heart failure, unspecified: Secondary | ICD-10-CM | POA: Diagnosis not present

## 2017-11-14 DIAGNOSIS — L89159 Pressure ulcer of sacral region, unspecified stage: Secondary | ICD-10-CM | POA: Diagnosis present

## 2017-11-14 DIAGNOSIS — I10 Essential (primary) hypertension: Secondary | ICD-10-CM | POA: Diagnosis present

## 2017-11-14 DIAGNOSIS — R05 Cough: Secondary | ICD-10-CM

## 2017-11-14 DIAGNOSIS — L89309 Pressure ulcer of unspecified buttock, unspecified stage: Secondary | ICD-10-CM | POA: Diagnosis present

## 2017-11-14 DIAGNOSIS — E1129 Type 2 diabetes mellitus with other diabetic kidney complication: Secondary | ICD-10-CM | POA: Diagnosis present

## 2017-11-14 DIAGNOSIS — G9340 Encephalopathy, unspecified: Secondary | ICD-10-CM | POA: Diagnosis present

## 2017-11-14 DIAGNOSIS — Z79891 Long term (current) use of opiate analgesic: Secondary | ICD-10-CM

## 2017-11-14 DIAGNOSIS — Z7401 Bed confinement status: Secondary | ICD-10-CM | POA: Diagnosis not present

## 2017-11-14 DIAGNOSIS — Z6832 Body mass index (BMI) 32.0-32.9, adult: Secondary | ICD-10-CM

## 2017-11-14 LAB — CBC WITH DIFFERENTIAL/PLATELET
Basophils Absolute: 0 10*3/uL (ref 0.0–0.1)
Basophils Relative: 0 %
EOS ABS: 0 10*3/uL (ref 0.0–0.7)
Eosinophils Relative: 0 %
HCT: 39.5 % (ref 39.0–52.0)
HEMOGLOBIN: 13.7 g/dL (ref 13.0–17.0)
LYMPHS ABS: 2.2 10*3/uL (ref 0.7–4.0)
Lymphocytes Relative: 11 %
MCH: 29.4 pg (ref 26.0–34.0)
MCHC: 34.7 g/dL (ref 30.0–36.0)
MCV: 84.8 fL (ref 78.0–100.0)
MONOS PCT: 6 %
Monocytes Absolute: 1.1 10*3/uL — ABNORMAL HIGH (ref 0.1–1.0)
NEUTROS PCT: 83 %
Neutro Abs: 16.1 10*3/uL — ABNORMAL HIGH (ref 1.7–7.7)
Platelets: 417 10*3/uL — ABNORMAL HIGH (ref 150–400)
RBC: 4.66 MIL/uL (ref 4.22–5.81)
RDW: 14.3 % (ref 11.5–15.5)
WBC: 19.4 10*3/uL — ABNORMAL HIGH (ref 4.0–10.5)

## 2017-11-14 LAB — I-STAT CG4 LACTIC ACID, ED
LACTIC ACID, VENOUS: 1.19 mmol/L (ref 0.5–1.9)
Lactic Acid, Venous: 2.68 mmol/L (ref 0.5–1.9)

## 2017-11-14 LAB — I-STAT CHEM 8, ED
BUN: 34 mg/dL — AB (ref 6–20)
CALCIUM ION: 1.12 mmol/L — AB (ref 1.15–1.40)
CHLORIDE: 94 mmol/L — AB (ref 101–111)
CREATININE: 1.4 mg/dL — AB (ref 0.61–1.24)
GLUCOSE: 323 mg/dL — AB (ref 65–99)
HCT: 43 % (ref 39.0–52.0)
Hemoglobin: 14.6 g/dL (ref 13.0–17.0)
Potassium: 3.8 mmol/L (ref 3.5–5.1)
Sodium: 136 mmol/L (ref 135–145)
TCO2: 29 mmol/L (ref 22–32)

## 2017-11-14 LAB — BLOOD GAS, VENOUS
Acid-Base Excess: 2.9 mmol/L — ABNORMAL HIGH (ref 0.0–2.0)
BICARBONATE: 27.7 mmol/L (ref 20.0–28.0)
O2 CONTENT: 3 L/min
O2 Saturation: 71.6 %
PATIENT TEMPERATURE: 101
pCO2, Ven: 47.4 mmHg (ref 44.0–60.0)
pH, Ven: 7.392 (ref 7.250–7.430)
pO2, Ven: 41.7 mmHg (ref 32.0–45.0)

## 2017-11-14 LAB — COMPREHENSIVE METABOLIC PANEL
ALBUMIN: 2.9 g/dL — AB (ref 3.5–5.0)
ALT: 36 U/L (ref 17–63)
AST: 47 U/L — ABNORMAL HIGH (ref 15–41)
Alkaline Phosphatase: 98 U/L (ref 38–126)
Anion gap: 11 (ref 5–15)
BUN: 32 mg/dL — ABNORMAL HIGH (ref 6–20)
CHLORIDE: 98 mmol/L — AB (ref 101–111)
CO2: 27 mmol/L (ref 22–32)
CREATININE: 1.68 mg/dL — AB (ref 0.61–1.24)
Calcium: 9.1 mg/dL (ref 8.9–10.3)
GFR calc Af Amer: 42 mL/min — ABNORMAL LOW (ref >60)
GFR calc non Af Amer: 37 mL/min — ABNORMAL LOW (ref >60)
Glucose, Bld: 320 mg/dL — ABNORMAL HIGH (ref 65–99)
Potassium: 4 mmol/L (ref 3.5–5.1)
SODIUM: 136 mmol/L (ref 135–145)
Total Bilirubin: 0.6 mg/dL (ref 0.3–1.2)
Total Protein: 8 g/dL (ref 6.5–8.1)

## 2017-11-14 LAB — I-STAT TROPONIN, ED: Troponin i, poc: 0.03 ng/mL (ref 0.00–0.08)

## 2017-11-14 MED ORDER — ACETAMINOPHEN 650 MG RE SUPP
650.0000 mg | Freq: Once | RECTAL | Status: AC
  Administered 2017-11-14: 650 mg via RECTAL
  Filled 2017-11-14: qty 1

## 2017-11-14 MED ORDER — CEFEPIME HCL 2 G IJ SOLR
2.0000 g | Freq: Once | INTRAMUSCULAR | Status: AC
  Administered 2017-11-14: 2 g via INTRAVENOUS
  Filled 2017-11-14: qty 2

## 2017-11-14 MED ORDER — VANCOMYCIN HCL IN DEXTROSE 1-5 GM/200ML-% IV SOLN: 1000.0000 mg | Freq: Once | INTRAVENOUS | Status: DC

## 2017-11-14 MED ORDER — SODIUM CHLORIDE 0.9 % IV BOLUS (SEPSIS)
1000.0000 mL | Freq: Once | INTRAVENOUS | Status: AC
  Administered 2017-11-14: 1000 mL via INTRAVENOUS

## 2017-11-14 MED ORDER — ALBUTEROL SULFATE (2.5 MG/3ML) 0.083% IN NEBU
5.0000 mg | INHALATION_SOLUTION | Freq: Once | RESPIRATORY_TRACT | Status: AC
  Administered 2017-11-14: 5 mg via RESPIRATORY_TRACT
  Filled 2017-11-14: qty 6

## 2017-11-14 MED ORDER — SODIUM CHLORIDE 0.9 % IV BOLUS (SEPSIS)
2500.0000 mL | Freq: Once | INTRAVENOUS | Status: AC
  Administered 2017-11-14: 1000 mL via INTRAVENOUS

## 2017-11-14 MED ORDER — DEXTROSE 5 % IV SOLN: 2.0000 g | Freq: Once | INTRAVENOUS | Status: DC

## 2017-11-14 MED ORDER — IPRATROPIUM BROMIDE 0.02 % IN SOLN
0.5000 mg | Freq: Once | RESPIRATORY_TRACT | Status: AC
  Administered 2017-11-14: 0.5 mg via RESPIRATORY_TRACT
  Filled 2017-11-14: qty 2.5

## 2017-11-14 MED ORDER — VANCOMYCIN HCL 10 G IV SOLR
2000.0000 mg | Freq: Once | INTRAVENOUS | Status: AC
  Administered 2017-11-14: 2000 mg via INTRAVENOUS
  Filled 2017-11-14: qty 2000

## 2017-11-14 NOTE — ED Provider Notes (Signed)
[facility] DEPT Provider Note   CSN: [id number] Arrival date & time: 11/14/17  2030     History   Chief Complaint Chief Complaint  Patient presents with  . Influenza    HPI Preston Moore is a 84 y.o. male.  HPI  Patient is an 84 year old male with a history of diabetes mellitus protheses on insulin), hypertension, hyperlipidemia, and irregular heart rhythm (not on [facility]) presenting for new onset oxygen requirement [facility].  Patient is not on oxygen at baseline.  Per the facility, patient was satting at 89% on room air.  Patient has had increasing productive cough over the past 2 days.  Patient was evaluated his primary care provider 2 days ago and prescribed an inhaler as well as Tamiflu as prophylaxis for influenza, as the facility has seen multiple positive cases.  Patient denies any chest pain or other pain at this time.  Patient is unable to answer questions beyond pain.  Level 5 caveat altered mental status.  I spoke with nurse manager, Seth Bake, at [employer].  She reports that the patient is typically alert oriented x2 at baseline, and has some confusion, however was more confused today per staff.  Patient is typically bedbound.   Past Medical History:  Diagnosis Date  . Atrial flutter ([place])   . B12 deficiency 02/22/2012  . Closed right hip fracture, initial encounter ([place]) 04/06/2017  . Diabetes mellitus   . Hyperlipidemia   . Hypertension   . Ileus ([place]) 02/29/2012  . Malnutrition ([place]) 02/29/2012  . Neuropathy   . Non compliance with medical treatment 02/29/2012  . Pancreatitis 02/22/2012  . SBO (small bowel obstruction) ([place]) 02/23/2012  . Syncope and collapse     Patient Active Problem List   Diagnosis Date Noted  . Depression 10/28/2017  . Insomnia due to other mental disorder 10/28/2017  . Edema of both lower extremities 10/28/2017  . Pressure injury of skin of right buttock 10/28/2017  . Blisters of multiple sites 10/25/2017    . Diabetic peripheral neuropathy ([place]) 10/25/2017  . Type II diabetes mellitus with renal manifestations, uncontrolled ([place]) 08/21/2017  . Dyslipidemia associated with type 2 diabetes mellitus ([place]) 08/21/2017  . Type 2 diabetes mellitus with diabetic neuropathy, with long-term current use of insulin ([place]) 08/21/2017  . Fall   . Closed right hip fracture, initial encounter ([place]) 04/06/2017  . CKD (chronic kidney disease), stage III ([place])   . Essential hypertension   . Obesity (BMI 30.0-34.9) 02/29/2012  . GERD without esophagitis 02/22/2012  . Iron deficiency anemia 02/18/2012  . Ischemic cardiomyopathy 11/01/2010  . UNSPECIFIED PERIPHERAL VASCULAR DISEASE 10/05/2010    Past Surgical History:  Procedure Laterality Date  . BACK SURGERY    . COMPRESSION HIP SCREW Right 04/07/2017   Procedure: RIGHT HIP INTRATROCHANTERIC NAILING;  Surgeon: [name], MD;  Location: [facility];  Service: Orthopedics;  Laterality: Right;  . LAMINECTOMY  1965       Home Medications    Prior to Admission medications   Medication Sig Start Date End Date Taking? Authorizing Provider  acetaminophen (TYLENOL) 500 MG tablet Take 1,000 mg by mouth 3 (three) times daily. 09/28/17  Yes [provider]  albuterol (ACCUNEB) 0.63 MG/3ML nebulizer solution Take 1 ampule by nebulization every 6 (six) hours as needed for wheezing or shortness of breath.   Yes [provider]  Amino Acids-Protein Hydrolys (FEEDING SUPPLEMENT, PRO-STAT SUGAR FREE 64,) LIQD Take 30 mLs 2 (two) times daily by mouth.  Yes [provider]  atorvastatin (LIPITOR) 10 MG tablet Take 10 mg by mouth at bedtime. 10/11/17  Yes [provider]  docusate sodium (COLACE) 100 MG capsule Take 1 capsule (100 mg total) by mouth 2 (two) times daily. 04/10/17  Yes Reyne Dumas, MD  ferrous sulfate 325 (65 FE) MG tablet Take 325 mg by mouth daily with breakfast.    Yes [provider]  furosemide (LASIX) 20 MG  tablet Take 20 mg by mouth daily.   Yes [provider]  gabapentin (NEURONTIN) 300 MG capsule Take 300 mg by mouth 3 (three) times daily.    Yes [provider]  HYDROcodone-acetaminophen (NORCO/VICODIN) 5-325 MG tablet Take 1 tablet by mouth every 6 (six) hours as needed for moderate pain. 10/23/17  Yes Gerlene Fee, NP  insulin aspart (NOVOLOG) 100 UNIT/ML injection Inject 11 Units into the skin 3 (three) times daily with meals. Inject 11 units subcutaneously after meals 10/26/17  Yes [provider]  insulin glargine (LANTUS) 100 UNIT/ML injection Inject 52 Units into the skin at bedtime.    Yes [provider]  metoprolol succinate (TOPROL-XL) 25 MG 24 hr tablet Take 25 mg by mouth daily.   Yes [provider]  Multiple Vitamins-Minerals (MULTIVITAMIN ADULT) TABS Take 1 tablet by mouth daily.   Yes [provider]  Multiple Vitamins-Minerals (MULTIVITAMIN ADULTS PO) Take 1 capsule by mouth daily.   Yes [provider]  pantoprazole (PROTONIX) 40 MG tablet Take 40 mg by mouth daily.   Yes [provider]  pseudoephedrine-guaifenesin (MUCINEX D) 60-600 MG 12 hr tablet Take 1 tablet by mouth every 12 (twelve) hours.   Yes [provider]  Skin Protectants, Misc. (CALAZIME SKIN PROTECTANT EX) Apply 1 application topically 2 (two) times daily. Apply to buttock every shift    Yes [provider]  Skin Protectants, Misc. (EUCERIN) cream Apply to BLE topically two times daily for dry skin   Yes [provider]  traMADol (ULTRAM) 50 MG tablet Give 1/2 tablet (25mg ) by mouth two times daily 11/02/17  Yes Gerlene Fee, NP  traZODone (DESYREL) 100 MG tablet Take 100 mg at bedtime by mouth.    Yes [provider]  Cholecalciferol 50000 units TABS Give 1 tablet by mouth every Saturday    [provider]  Multiple Vitamins-Minerals (DECUBI-VITE) CAPS Take 1 capsule by mouth daily.     [provider]  polyethylene glycol (MIRALAX / GLYCOLAX) packet Take 17 g by mouth 2 (two) times daily. Patient taking differently: Take 17 g by mouth 2 (two) times daily as needed for moderate constipation.  04/10/17   Reyne Dumas, MD    Family History Family History  Problem Relation Age of Onset  . Hypertension Neg Hx   . Coronary artery disease Neg Hx   . Diabetes Neg Hx     Social History Social History   Tobacco Use  . Smoking status: Current Every Day Smoker    Packs/day: 0.50    Years: 10.00    Pack years: 5.00    Types: Cigarettes  . Smokeless tobacco: Never Used  Substance Use Topics  . Alcohol use: Yes    Alcohol/week: 21.6 oz    Types: 36 Cans of beer per week  . Drug use: No     Allergies   Patient has no known allergies.   Review of Systems Review of Systems  Constitutional: Positive for fever.  Cardiovascular: Negative for chest pain.  Musculoskeletal: Negative for arthralgias.   Level 5 caveat altered mental status.  Physical Exam Updated Vital Signs BP 122/64 (BP Location: Left Arm)   Pulse (!) 118   Temp (!) 101 F (38.3 C) (Oral)   SpO2 95% Comment: Room air sat is 90  Physical Exam  Constitutional: He appears well-developed and well-nourished. No distress.  HENT:  Head: Normocephalic and atraumatic.  Mouth/Throat: Oropharynx is clear and moist.  Eyes: Conjunctivae and EOM are normal. Pupils are equal, round, and reactive to light.  There is crusting around the left eye.  Neck: Normal range of motion. Neck supple.  Cardiovascular: S1 normal and S2 normal.  No murmur heard. Tachycardia and irregular rhythm noted.  Patient has strong radial and dorsalis pedis pulses bilaterally.  Pulmonary/Chest: Effort normal. He has rales.  Rhonchi present in upper lung fields.  Abdominal: Soft. He exhibits no distension. There is no tenderness. There is no guarding.  Musculoskeletal: Normal range of motion. He exhibits no edema or  deformity.  Lymphadenopathy:    He has no cervical adenopathy.  Neurological: He is alert.  Cranial nerves grossly intact. Patient moves extremities symmetrically and with good coordination.  Skin: Skin is warm and dry.  Patient exhibits decubitus ulcers of the left buttocks.  These are stage II with some surrounding erythema or purulent drainage.  Examination of decubitus ulcers performed with nurse chaperone present.  Psychiatric: He has a normal mood and affect. His behavior is normal. Judgment and thought content normal.  Nursing note and vitals reviewed.      ED Treatments / Results  Labs (all labs ordered are listed, but only abnormal results are displayed) Labs Reviewed  URINE CULTURE  CULTURE, BLOOD (ROUTINE X 2)  CULTURE, BLOOD (ROUTINE X 2)  BLOOD GAS, VENOUS  URINALYSIS, ROUTINE W REFLEX MICROSCOPIC  URINALYSIS, COMPLETE (UACMP) WITH MICROSCOPIC  CBC WITH DIFFERENTIAL/PLATELET  I-STAT CHEM 8, ED  I-STAT CG4 LACTIC ACID, ED  I-STAT TROPONIN, ED  I-STAT CG4 LACTIC ACID, ED    EKG  EKG Interpretation None       Radiology No results found.  Procedures Procedures (including critical care time)  CRITICAL CARE Performed by: Albesa Seen   Total critical care time: 35 minutes  Critical care time was exclusive of separately billable procedures and treating other patients.  Critical care was necessary to treat or prevent imminent or life-threatening deterioration.  Critical care was time spent personally by me on the following activities: development of treatment plan with patient and/or surrogate as well as nursing, discussions with consultants, evaluation of patient's response to treatment, examination of patient, obtaining history from patient or surrogate, ordering and performing treatments and interventions, ordering and review of laboratory studies, ordering and review of radiographic studies, pulse oximetry and re-evaluation of patient's  condition.  Sepsis - Repeat Assessment  Performed at:    11:13 PM   Vitals     Blood pressure 119/81, pulse (!) 111, temperature (!) 100.4 F (38 C), temperature source Oral, resp. rate (!) 25, SpO2 93 %.  Heart:     Tachycardic  Lungs:    Rales, per original evaluation.  Capillary Refill:   <2 sec  Peripheral Pulse:   Dorsalis pedis pulse  palpable  Skin:     Normal Color   Mental status improved after fluid repletion.  Patient was able to answer his full name, as well as identify month, year, and city he is living in.  Medications Ordered in ED Medications  albuterol (  PROVENTIL) (2.5 MG/3ML) 0.083% nebulizer solution 5 mg (not administered)  ipratropium (ATROVENT) nebulizer solution 0.5 mg (not administered)  sodium chloride 0.9 % bolus 1,000 mL (not administered)  acetaminophen (TYLENOL) suppository 650 mg (not administered)     Initial Impression / Assessment and Plan / ED Course  I have reviewed the triage vital signs and the nursing notes.  Pertinent labs & imaging results that were available during my care of the patient were reviewed by me and considered in my medical decision making (see chart for details).       Final Clinical Impressions(s) / ED Diagnoses   Final diagnoses:  Hypoxia  Cough  Sepsis, due to unspecified organism [facility])   Patient is altered and demonstrating hypoxia on room air.  Patient is satting 95-96% on 3 L.  Tachycardia is irregular, however EKG without evidence of atrial fibrillation.  Likely multifocal atrial tachycardia.  Patient exhibited signs of sepsis, and will initiate code sepsis at this time. Patient case discussed with Dr. Delora Fuel, who independently evaluated patient.  Initial lactic acid is 2.68.  Creatinine 1.68 and BUN 32.  Creatinine slightly elevated for patient's baseline of 1.3-1.4.  Likely prerenal because of AK I related to sepsis.  Patient also exhibits a leukocytosis of 19.4.  Total of 2500 L of normal saline  initiated.  After sepsis reevaluation after approximately 1 L normal saline, patient has improved mentation.  Pulmonary exam is consistent with initial evaluation, and demonstrates rales posterior lower lung fields.  Will admit at this time for sepsis secondary to likely multifocal pneumonia either viral or bacterial origin.  Patient covered with vancomycin and cefepime.  Awaiting urinalysis results.  Additionally, cultures of decubitus ulcers on buttocks sent and patient receiving appropriate coverage for skin and soft tissue infection with vancomycin and cefepime.  Spoke with Dr. Hal Hope who will admit the patient to telemetry.  ED Discharge Orders    None       Tamala Julian 86/76/72 0947    Delora Fuel, MD 09/62/83 [phone]

## 2017-11-14 NOTE — Progress Notes (Signed)
A consult was received from an ED physician for Vancomycin & Cefepime per pharmacy dosing.  The patient's profile has been reviewed for ht/wt/allergies/indication/available labs.   A one time order has been placed for Vancomycin 2gm IV & Cefepime 2gm IV.  Further antibiotics/pharmacy consults should be ordered by admitting physician if indicated.                       Thank you, Biagio Borg 11/14/2017  9:30 PM

## 2017-11-14 NOTE — ED Triage Notes (Signed)
Pt BIB [facility]. Pt from [facility]. Pt here for "oxygen deprivation" EMS saw a room air sat of 91%. Pt placed on 3 L [place] and improved to 93%. PT warm to touch facility reported temp 99.2. Pt started on tamiflu at facility. Facility reported a increased WBC count.

## 2017-11-14 NOTE — ED Notes (Signed)
Bed: EN40 Expected date:  Expected time:  Means of arrival:  Comments: 84 yr old fever

## 2017-11-14 NOTE — ED Notes (Signed)
Pt placed on condom cath to try and obtain urine

## 2017-11-15 ENCOUNTER — Other Ambulatory Visit: Payer: Self-pay

## 2017-11-15 ENCOUNTER — Encounter (HOSPITAL_COMMUNITY): Payer: Self-pay | Admitting: Internal Medicine

## 2017-11-15 ENCOUNTER — Inpatient Hospital Stay (HOSPITAL_COMMUNITY): Payer: Medicare Other

## 2017-11-15 DIAGNOSIS — I10 Essential (primary) hypertension: Secondary | ICD-10-CM

## 2017-11-15 DIAGNOSIS — N183 Chronic kidney disease, stage 3 (moderate): Secondary | ICD-10-CM

## 2017-11-15 DIAGNOSIS — G9340 Encephalopathy, unspecified: Secondary | ICD-10-CM

## 2017-11-15 DIAGNOSIS — R0902 Hypoxemia: Secondary | ICD-10-CM

## 2017-11-15 LAB — COMPREHENSIVE METABOLIC PANEL
ALBUMIN: 2.4 g/dL — AB (ref 3.5–5.0)
ALT: 29 U/L (ref 17–63)
ANION GAP: 9 (ref 5–15)
AST: 31 U/L (ref 15–41)
Alkaline Phosphatase: 75 U/L (ref 38–126)
BUN: 27 mg/dL — ABNORMAL HIGH (ref 6–20)
CHLORIDE: 102 mmol/L (ref 101–111)
CO2: 24 mmol/L (ref 22–32)
Calcium: 8 mg/dL — ABNORMAL LOW (ref 8.9–10.3)
Creatinine, Ser: 1.36 mg/dL — ABNORMAL HIGH (ref 0.61–1.24)
GFR calc Af Amer: 55 mL/min — ABNORMAL LOW (ref >60)
GFR calc non Af Amer: 47 mL/min — ABNORMAL LOW (ref >60)
GLUCOSE: 305 mg/dL — AB (ref 65–99)
POTASSIUM: 3.6 mmol/L (ref 3.5–5.1)
SODIUM: 135 mmol/L (ref 135–145)
Total Bilirubin: 0.8 mg/dL (ref 0.3–1.2)
Total Protein: 6.6 g/dL (ref 6.5–8.1)

## 2017-11-15 LAB — CBC WITH DIFFERENTIAL/PLATELET
Basophils Absolute: 0 10*3/uL (ref 0.0–0.1)
Basophils Relative: 0 %
EOS ABS: 0 10*3/uL (ref 0.0–0.7)
Eosinophils Relative: 0 %
HCT: 34.5 % — ABNORMAL LOW (ref 39.0–52.0)
Hemoglobin: 11.4 g/dL — ABNORMAL LOW (ref 13.0–17.0)
Lymphocytes Relative: 19 %
Lymphs Abs: 3.1 10*3/uL (ref 0.7–4.0)
MCH: 28.1 pg (ref 26.0–34.0)
MCHC: 33 g/dL (ref 30.0–36.0)
MCV: 85 fL (ref 78.0–100.0)
MONO ABS: 0.9 10*3/uL (ref 0.1–1.0)
MONOS PCT: 6 %
NEUTROS PCT: 75 %
Neutro Abs: 12.5 10*3/uL — ABNORMAL HIGH (ref 1.7–7.7)
Platelets: 357 10*3/uL (ref 150–400)
RBC: 4.06 MIL/uL — ABNORMAL LOW (ref 4.22–5.81)
RDW: 14.4 % (ref 11.5–15.5)
WBC: 16.6 10*3/uL — ABNORMAL HIGH (ref 4.0–10.5)

## 2017-11-15 LAB — URINALYSIS, COMPLETE (UACMP) WITH MICROSCOPIC
Bilirubin Urine: NEGATIVE
HGB URINE DIPSTICK: NEGATIVE
KETONES UR: 5 mg/dL — AB
LEUKOCYTES UA: NEGATIVE
NITRITE: NEGATIVE
PROTEIN: 30 mg/dL — AB
Specific Gravity, Urine: 1.023 (ref 1.005–1.030)
pH: 5 (ref 5.0–8.0)

## 2017-11-15 LAB — INFLUENZA PANEL BY PCR (TYPE A & B)
INFLAPCR: POSITIVE — AB
INFLBPCR: NEGATIVE

## 2017-11-15 LAB — GLUCOSE, CAPILLARY
GLUCOSE-CAPILLARY: 306 mg/dL — AB (ref 65–99)
Glucose-Capillary: 263 mg/dL — ABNORMAL HIGH (ref 65–99)
Glucose-Capillary: 257 mg/dL — ABNORMAL HIGH (ref 65–99)
Glucose-Capillary: 317 mg/dL — ABNORMAL HIGH (ref 65–99)

## 2017-11-15 LAB — TROPONIN I: Troponin I: 0.03 ng/mL (ref <0.03)

## 2017-11-15 LAB — APTT: APTT: 31 s (ref 24–36)

## 2017-11-15 LAB — MRSA PCR SCREENING: MRSA BY PCR: NEGATIVE

## 2017-11-15 LAB — PROTIME-INR
INR: 1.15
PROTHROMBIN TIME: 14.6 s (ref 11.4–15.2)

## 2017-11-15 LAB — LACTIC ACID, PLASMA
Lactic Acid, Venous: 1.1 mmol/L (ref 0.5–1.9)
Lactic Acid, Venous: 1.6 mmol/L (ref 0.5–1.9)

## 2017-11-15 LAB — PROCALCITONIN: Procalcitonin: 0.56 ng/mL

## 2017-11-15 MED ORDER — OSELTAMIVIR PHOSPHATE 30 MG PO CAPS
30.0000 mg | ORAL_CAPSULE | Freq: Two times a day (BID) | ORAL | Status: DC
  Administered 2017-11-15 – 2017-11-17 (×5): 30 mg via ORAL
  Filled 2017-11-15 (×6): qty 1

## 2017-11-15 MED ORDER — TRAMADOL HCL 50 MG PO TABS
50.0000 mg | ORAL_TABLET | Freq: Two times a day (BID) | ORAL | Status: DC
  Administered 2017-11-15 – 2017-11-17 (×5): 50 mg via ORAL
  Filled 2017-11-15 (×5): qty 1

## 2017-11-15 MED ORDER — METOPROLOL TARTRATE 5 MG/5ML IV SOLN
2.5000 mg | Freq: Four times a day (QID) | INTRAVENOUS | Status: DC
  Administered 2017-11-15 (×2): 2.5 mg via INTRAVENOUS
  Filled 2017-11-15 (×2): qty 5

## 2017-11-15 MED ORDER — SODIUM CHLORIDE 0.9 % IV SOLN
INTRAVENOUS | Status: DC
  Administered 2017-11-15: 04:00:00 via INTRAVENOUS

## 2017-11-15 MED ORDER — METOPROLOL SUCCINATE ER 25 MG PO TB24
25.0000 mg | ORAL_TABLET | Freq: Every day | ORAL | Status: DC
  Administered 2017-11-16 – 2017-11-17 (×2): 25 mg via ORAL
  Filled 2017-11-15 (×2): qty 1

## 2017-11-15 MED ORDER — ONDANSETRON HCL 4 MG PO TABS: 4.0000 mg | ORAL_TABLET | Freq: Four times a day (QID) | ORAL | Status: DC | PRN

## 2017-11-15 MED ORDER — FUROSEMIDE 20 MG PO TABS
20.0000 mg | ORAL_TABLET | Freq: Every day | ORAL | Status: DC
  Administered 2017-11-15 – 2017-11-16 (×2): 20 mg via ORAL
  Filled 2017-11-15 (×2): qty 1

## 2017-11-15 MED ORDER — PIPERACILLIN-TAZOBACTAM 3.375 G IVPB 30 MIN
3.3750 g | Freq: Once | INTRAVENOUS | Status: AC
  Administered 2017-11-15: 3.375 g via INTRAVENOUS
  Filled 2017-11-15: qty 50

## 2017-11-15 MED ORDER — BENZONATATE 100 MG PO CAPS: 200.0000 mg | ORAL_CAPSULE | Freq: Three times a day (TID) | ORAL | Status: DC | PRN

## 2017-11-15 MED ORDER — PREMIER PROTEIN SHAKE
11.0000 [oz_av] | Freq: Two times a day (BID) | ORAL | Status: DC
  Administered 2017-11-15 – 2017-11-17 (×4): 11 [oz_av] via ORAL
  Filled 2017-11-15 (×5): qty 325.31

## 2017-11-15 MED ORDER — GUAIFENESIN ER 600 MG PO TB12
1200.0000 mg | ORAL_TABLET | Freq: Two times a day (BID) | ORAL | Status: DC
  Administered 2017-11-15 – 2017-11-17 (×5): 1200 mg via ORAL
  Filled 2017-11-15 (×5): qty 2

## 2017-11-15 MED ORDER — ADULT MULTIVITAMIN W/MINERALS CH
1.0000 | ORAL_TABLET | Freq: Every day | ORAL | Status: DC
  Administered 2017-11-15 – 2017-11-17 (×3): 1 via ORAL
  Filled 2017-11-15 (×3): qty 1

## 2017-11-15 MED ORDER — GERHARDT'S BUTT CREAM
TOPICAL_CREAM | Freq: Three times a day (TID) | CUTANEOUS | Status: DC
  Administered 2017-11-15 – 2017-11-17 (×6): via TOPICAL
  Filled 2017-11-15 (×2): qty 1

## 2017-11-15 MED ORDER — ACETAMINOPHEN 650 MG RE SUPP: 650.0000 mg | Freq: Four times a day (QID) | RECTAL | Status: DC | PRN

## 2017-11-15 MED ORDER — METOPROLOL TARTRATE 5 MG/5ML IV SOLN: 5.0000 mg | Freq: Four times a day (QID) | INTRAVENOUS | Status: DC | PRN

## 2017-11-15 MED ORDER — HEPARIN SODIUM (PORCINE) 5000 UNIT/ML IJ SOLN
5000.0000 [IU] | Freq: Three times a day (TID) | INTRAMUSCULAR | Status: DC
  Administered 2017-11-15 – 2017-11-17 (×8): 5000 [IU] via SUBCUTANEOUS
  Filled 2017-11-15 (×8): qty 1

## 2017-11-15 MED ORDER — INSULIN GLARGINE 100 UNIT/ML ~~LOC~~ SOLN
10.0000 [IU] | Freq: Every day | SUBCUTANEOUS | Status: DC
  Filled 2017-11-15: qty 0.1

## 2017-11-15 MED ORDER — ALBUTEROL SULFATE (2.5 MG/3ML) 0.083% IN NEBU
2.5000 mg | INHALATION_SOLUTION | Freq: Three times a day (TID) | RESPIRATORY_TRACT | Status: DC
  Administered 2017-11-16 – 2017-11-17 (×5): 2.5 mg via RESPIRATORY_TRACT
  Filled 2017-11-15 (×5): qty 3

## 2017-11-15 MED ORDER — ACETAMINOPHEN 325 MG PO TABS
650.0000 mg | ORAL_TABLET | Freq: Four times a day (QID) | ORAL | Status: DC | PRN
  Administered 2017-11-17: 650 mg via ORAL
  Filled 2017-11-15: qty 2

## 2017-11-15 MED ORDER — INSULIN ASPART 100 UNIT/ML ~~LOC~~ SOLN
0.0000 [IU] | SUBCUTANEOUS | Status: DC
Start: 2017-11-15 — End: 2017-11-17
  Administered 2017-11-15: 7 [IU] via SUBCUTANEOUS
  Administered 2017-11-15 (×2): 5 [IU] via SUBCUTANEOUS
  Administered 2017-11-15: 7 [IU] via SUBCUTANEOUS
  Administered 2017-11-15 – 2017-11-16 (×2): 5 [IU] via SUBCUTANEOUS
  Administered 2017-11-16: 3 [IU] via SUBCUTANEOUS
  Administered 2017-11-16: 7 [IU] via SUBCUTANEOUS
  Administered 2017-11-16: 5 [IU] via SUBCUTANEOUS
  Administered 2017-11-16: 2 [IU] via SUBCUTANEOUS
  Administered 2017-11-16: 3 [IU] via SUBCUTANEOUS
  Administered 2017-11-17 (×2): 5 [IU] via SUBCUTANEOUS
  Administered 2017-11-17: 3 [IU] via SUBCUTANEOUS
  Administered 2017-11-17 (×2): 2 [IU] via SUBCUTANEOUS

## 2017-11-15 MED ORDER — INSULIN GLARGINE 100 UNIT/ML ~~LOC~~ SOLN
25.0000 [IU] | Freq: Every day | SUBCUTANEOUS | Status: DC
  Administered 2017-11-15: 25 [IU] via SUBCUTANEOUS
  Filled 2017-11-15 (×2): qty 0.25

## 2017-11-15 MED ORDER — PIPERACILLIN-TAZOBACTAM 3.375 G IVPB
3.3750 g | Freq: Three times a day (TID) | INTRAVENOUS | Status: DC
  Administered 2017-11-15 – 2017-11-16 (×3): 3.375 g via INTRAVENOUS
  Filled 2017-11-15 (×4): qty 50

## 2017-11-15 MED ORDER — ONDANSETRON HCL 4 MG/2ML IJ SOLN: 4.0000 mg | Freq: Four times a day (QID) | INTRAMUSCULAR | Status: DC | PRN

## 2017-11-15 MED ORDER — VANCOMYCIN HCL 10 G IV SOLR
1250.0000 mg | INTRAVENOUS | Status: DC
  Administered 2017-11-15: 1250 mg via INTRAVENOUS
  Filled 2017-11-15: qty 1250

## 2017-11-15 MED ORDER — POLYETHYLENE GLYCOL 3350 17 G PO PACK
17.0000 g | PACK | Freq: Every day | ORAL | Status: DC
  Administered 2017-11-17: 17 g via ORAL
  Filled 2017-11-15: qty 1

## 2017-11-15 MED ORDER — ALBUTEROL SULFATE (2.5 MG/3ML) 0.083% IN NEBU: 2.5000 mg | INHALATION_SOLUTION | RESPIRATORY_TRACT | Status: DC

## 2017-11-15 MED ORDER — ALBUTEROL SULFATE (2.5 MG/3ML) 0.083% IN NEBU
2.5000 mg | INHALATION_SOLUTION | Freq: Four times a day (QID) | RESPIRATORY_TRACT | Status: DC
  Administered 2017-11-15 (×3): 2.5 mg via RESPIRATORY_TRACT
  Filled 2017-11-15 (×3): qty 3

## 2017-11-15 MED ORDER — VANCOMYCIN HCL IN DEXTROSE 1-5 GM/200ML-% IV SOLN: 1000.0000 mg | Freq: Once | INTRAVENOUS | Status: DC

## 2017-11-15 MED ORDER — GABAPENTIN 300 MG PO CAPS
300.0000 mg | ORAL_CAPSULE | Freq: Three times a day (TID) | ORAL | Status: DC
  Administered 2017-11-15 – 2017-11-17 (×8): 300 mg via ORAL
  Filled 2017-11-15 (×8): qty 1

## 2017-11-15 MED ORDER — HYDRALAZINE HCL 20 MG/ML IJ SOLN: 10.0000 mg | INTRAMUSCULAR | Status: DC | PRN

## 2017-11-15 MED ORDER — ALBUTEROL SULFATE (2.5 MG/3ML) 0.083% IN NEBU: 2.5000 mg | INHALATION_SOLUTION | RESPIRATORY_TRACT | Status: DC | PRN

## 2017-11-15 NOTE — ED Notes (Signed)
ED TO INPATIENT HANDOFF REPORT  Name/Age/Gender Preston Moore 84 y.o. male  Code Status Code Status History    Date Active Date Inactive Code Status Order ID Comments User Context   04/06/2017 17:09 04/10/2017 18:07 Full Code [id number]  Rosita Fire, MD ED   02/18/2012 02:32 02/22/2012 18:24 Full Code [id number]  Theressa Millard, MD ED      Home/SNF/Other Rehab  Chief Complaint Flu like Symptoms  Level of Care/Admitting Diagnosis ED Disposition    ED Disposition Condition [place]: [facility] [[id number]]  Level of Care: Telemetry [5]  Admit to tele based on following criteria: Monitor for Ischemic changes  Diagnosis: Sepsis [facility]) [[id number]]  Admitting Physician: Rise Patience [phone]  Attending Physician: Rise Patience [phone]  Estimated length of stay: past midnight tomorrow  Certification:: I certify this patient will need inpatient services for at least 2 midnights  PT Class (Do Not Modify): Inpatient [101]  PT Acc Code (Do Not Modify): Private [1]       Medical History Past Medical History:  Diagnosis Date  . Atrial flutter ([place])   . B12 deficiency 02/22/2012  . Closed right hip fracture, initial encounter ([place]) 04/06/2017  . Diabetes mellitus   . Hyperlipidemia   . Hypertension   . Ileus ([place]) 02/29/2012  . Malnutrition ([place]) 02/29/2012  . Neuropathy   . Non compliance with medical treatment 02/29/2012  . Pancreatitis 02/22/2012  . SBO (small bowel obstruction) ([place]) 02/23/2012  . Syncope and collapse     Allergies No Known Allergies  IV Location/Drains/Wounds Patient Lines/Drains/Airways Status   Active Line/Drains/Airways    Name:   Placement date:   Placement time:   Site:   Days:   Peripheral IV 11/14/17 Right Antecubital   11/14/17    2122    Antecubital   1   Peripheral IV 11/14/17 Left Hand   11/14/17    2149    Hand   1   Incision (Closed) 04/07/17 Thigh Right   04/07/17    1447     222           Labs/Imaging Results for orders placed or performed during the hospital encounter of 11/14/17 (from the past 48 hour(s))  Comprehensive metabolic panel     Status: Abnormal   Collection Time: 11/14/17  9:30 PM  Result Value Ref Range   Sodium 136 135 - 145 mmol/L   Potassium 4.0 3.5 - 5.1 mmol/L   Chloride 98 (L) 101 - 111 mmol/L   CO2 27 22 - 32 mmol/L   Glucose, Bld 320 (H) 65 - 99 mg/dL   BUN 32 (H) 6 - 20 mg/dL   Creatinine, Ser 1.68 (H) 0.61 - 1.24 mg/dL   Calcium 9.1 8.9 - 10.3 mg/dL   Total Protein 8.0 6.5 - 8.1 g/dL   Albumin 2.9 (L) 3.5 - 5.0 g/dL   AST 47 (H) 15 - 41 U/L   ALT 36 17 - 63 U/L   Alkaline Phosphatase 98 38 - 126 U/L   Total Bilirubin 0.6 0.3 - 1.2 mg/dL   GFR calc non Af Amer 37 (L) >60 mL/min   GFR calc Af Amer 42 (L) >60 mL/min    Comment: (NOTE) The eGFR has been calculated using the CKD EPI equation. This calculation has not been validated in all clinical situations. eGFR's persistently <60 mL/min signify possible Chronic Kidney Disease.    Anion gap 11 5 - 15  CBC with Differential     Status: Abnormal   Collection Time: 11/14/17  9:30 PM  Result Value Ref Range   WBC 19.4 (H) 4.0 - 10.5 K/uL   RBC 4.66 4.22 - 5.81 MIL/uL   Hemoglobin 13.7 13.0 - 17.0 g/dL   HCT 39.5 39.0 - 52.0 %   MCV 84.8 78.0 - 100.0 fL   MCH 29.4 26.0 - 34.0 pg   MCHC 34.7 30.0 - 36.0 g/dL   RDW 14.3 11.5 - 15.5 %   Platelets 417 (H) 150 - 400 K/uL   Neutrophils Relative % 83 %   Neutro Abs 16.1 (H) 1.7 - 7.7 K/uL   Lymphocytes Relative 11 %   Lymphs Abs 2.2 0.7 - 4.0 K/uL   Monocytes Relative 6 %   Monocytes Absolute 1.1 (H) 0.1 - 1.0 K/uL   Eosinophils Relative 0 %   Eosinophils Absolute 0.0 0.0 - 0.7 K/uL   Basophils Relative 0 %   Basophils Absolute 0.0 0.0 - 0.1 K/uL  I-stat troponin, ED     Status: None   Collection Time: 11/14/17  9:35 PM  Result Value Ref Range   Troponin i, poc 0.03 0.00 - 0.08 ng/mL   Comment 3            Comment: Due to the  release kinetics of cTnI, a negative result within the first hours of the onset of symptoms does not rule out myocardial infarction with certainty. If myocardial infarction is still suspected, repeat the test at appropriate intervals.   I-Stat CG4 Lactic Acid, ED     Status: Abnormal   Collection Time: 11/14/17  9:36 PM  Result Value Ref Range   Lactic Acid, Venous 2.68 (HH) 0.5 - 1.9 mmol/L   Comment NOTIFIED PHYSICIAN   I-stat Chem 8, ED     Status: Abnormal   Collection Time: 11/14/17  9:37 PM  Result Value Ref Range   Sodium 136 135 - 145 mmol/L   Potassium 3.8 3.5 - 5.1 mmol/L   Chloride 94 (L) 101 - 111 mmol/L   BUN 34 (H) 6 - 20 mg/dL   Creatinine, Ser 1.40 (H) 0.61 - 1.24 mg/dL   Glucose, Bld 323 (H) 65 - 99 mg/dL   Calcium, Ion 1.12 (L) 1.15 - 1.40 mmol/L   TCO2 29 22 - 32 mmol/L   Hemoglobin 14.6 13.0 - 17.0 g/dL   HCT 43.0 39.0 - 52.0 %  Blood gas, venous     Status: Abnormal   Collection Time: 11/14/17  9:45 PM  Result Value Ref Range   O2 Content 3.0 L/min   Delivery systems NASAL CANNULA    pH, Ven 7.392 7.250 - 7.430   pCO2, Ven 47.4 44.0 - 60.0 mmHg   pO2, Ven 41.7 32.0 - 45.0 mmHg   Bicarbonate 27.7 20.0 - 28.0 mmol/L   Acid-Base Excess 2.9 (H) 0.0 - 2.0 mmol/L   O2 Saturation 71.6 %   Patient temperature 101.0    Collection site VEIN    Drawn by COLLECTED BY NURSE    Sample type VENOUS   I-Stat CG4 Lactic Acid, ED  (not at  [facility])     Status: None   Collection Time: 11/14/17 11:45 PM  Result Value Ref Range   Lactic Acid, Venous 1.19 0.5 - 1.9 mmol/L   Dg Chest 2 View  Result Date: 11/14/2017 CLINICAL DATA:  Cough, fever, and lethargy. EXAM: CHEST  2 VIEW COMPARISON:  04/06/2017 FINDINGS: Shallow inspiration. Cardiac enlargement with  mild pulmonary vascular congestion. Perihilar infiltration likely representing edema. No focal consolidation. No blunting of costophrenic angles. No pneumothorax. Degenerative changes in the spine. IMPRESSION: Shallow  inspiration. Cardiac enlargement with mild vascular congestion and perihilar edema. Electronically Signed   By: Lucienne Capers M.D.   On: 11/14/2017 21:47    Pending Labs Unresulted Labs (From admission, onward)   Start     Ordered   11/14/17 2255  Influenza panel by PCR (type A & B)  (Influenza PCR Panel)  Once,   R     11/14/17 2254   11/14/17 2218  Aerobic/Anaerobic Culture (surgical/deep wound)  Once,   R    Question:  Patient immune status  Answer:  Normal   11/14/17 2218   11/14/17 2125  CBC with Differential  STAT,   STAT     11/14/17 2124   11/14/17 2124  Blood culture (routine x 2)  BLOOD CULTURE X 2,   STAT     11/14/17 2124   11/14/17 2123  Urinalysis, Complete w Microscopic  STAT,   STAT     11/14/17 2124   11/14/17 2123  Urine culture  STAT,   STAT     11/14/17 2124      Vitals/Pain Today's Vitals   11/14/17 2203 11/14/17 2241 11/14/17 2247 11/14/17 2330  BP:   119/81 118/77  Pulse:   (!) 111 (!) 104  Resp:   (!) 25 (!) 29  Temp: (!) 102.2 F (39 C) (!) 100.4 F (38 C)    TempSrc: Rectal Oral    SpO2:   93% 94%    Isolation Precautions Droplet precaution  Medications Medications  vancomycin (VANCOCIN) 2,000 mg in sodium chloride 0.9 % 500 mL IVPB (2,000 mg Intravenous New Bag/Given 11/14/17 2231)  albuterol (PROVENTIL) (2.5 MG/3ML) 0.083% nebulizer solution 5 mg (5 mg Nebulization Given 11/14/17 2155)  ipratropium (ATROVENT) nebulizer solution 0.5 mg (0.5 mg Nebulization Given 11/14/17 2155)  sodium chloride 0.9 % bolus 1,000 mL (0 mLs Intravenous Stopped 11/14/17 2356)  acetaminophen (TYLENOL) suppository 650 mg (650 mg Rectal Given 11/14/17 2201)  ceFEPIme (MAXIPIME) 2 g in dextrose 5 % 50 mL IVPB (0 g Intravenous Stopped 11/14/17 2231)  sodium chloride 0.9 % bolus 2,500 mL (1,000 mLs Intravenous New Bag/Given 11/14/17 2231)    Mobility non-ambulatory

## 2017-11-15 NOTE — Progress Notes (Addendum)
PROGRESS NOTE        PATIENT DETAILS Name: Preston Moore Age: 84 y.o. Sex: male Date of Birth: 1934/08/25 Admit Date: 11/14/2017 Admitting Physician Rise Patience, MD [id number], Preston Ha, MD  Brief Narrative: Patient is a 84 y.o. male who was recently diagnosed with influenza and placed on Tamiflu brought to the hospital for evaluation of fever and worsening hypoxia.  Patient was subsequently admitted to the hospitalist service for further evaluation and treatment.  Subjective: Seems to be following some commands-coughing continues.  Does not appear short of breath.  Assessment/Plan: Sepsis: Felt to be secondary to influenza-but given advanced age-"gurgling" noted by the admitting MD-aspiration pneumonitis is certainly possible.  Continues to have leukocytosis-but is otherwise stable and unchanged since admission.  At this time would continue with Tamiflu and empiric antimicrobial therapy and follow cultures.  Await speech therapy evaluation.  Acute metabolic encephalopathy: Secondary to sepsis-CT head negative.  Mentation should improve with treatment of underlying infectious issues as noted above.  May have some amount of cognitive dysfunction/dementia at baseline.  Will discuss with family and obtain further collaborative history.  Hypertension: Controlled with metoprolol-follow.  History of atrial flutter: Continue metoprolol-does not appear to be on anticoagulation in the outpatient setting-given advanced age, frailty-prior history of alcohol use-he may not be a good long-term candidate for anticoagulation.  Continue telemetry monitoring  ?  Chronic diastolic heart failure: Given shortness of breath-obtain  2D echocardiogram.  Watch closely while on IV fluids  Chronic kidney disease stage III: Creatinine appears to be close to her usual baseline.  Follow periodically  Insulin-dependent DM-2: CBGs on the higher side-will increase Lantus to 25 units  (half of home dose) and SSI and slowly make changes accordingly.  Chronic narcotic use: Resume tramadol-minimize as much as possible.  Chronic constipation: Place on MiraLAX and follow  Chronic sacral and heel decubitus ulcers: Present prior to admission-await wound care evaluation.  DVT Prophylaxis: Prophylactic Heparin   Code Status: Full code   Family Communication: Daughter at bedside  Disposition Plan: Remain inpatient-back to SNF on discharge over the next 2-3 days.  Antimicrobial agents: Anti-infectives (From admission, onward)   Start     Dose/Rate Route Frequency Ordered Stop   11/15/17 2200  vancomycin (VANCOCIN) 1,250 mg in sodium chloride 0.9 % 250 mL IVPB     1,250 mg 166.7 mL/hr over 90 Minutes Intravenous Every 24 hours 11/15/17 0713     11/15/17 1400  piperacillin-tazobactam (ZOSYN) IVPB 3.375 g     3.375 g 12.5 mL/hr over 240 Minutes Intravenous Every 8 hours 11/15/17 0542     11/15/17 0415  oseltamivir (TAMIFLU) capsule 30 mg     30 mg Oral 2 times daily 11/15/17 0359 11/20/17 0959   11/15/17 0400  piperacillin-tazobactam (ZOSYN) IVPB 3.375 g     3.375 g 100 mL/hr over 30 Minutes Intravenous  Once 11/15/17 0359 11/15/17 0455   11/15/17 0400  vancomycin (VANCOCIN) IVPB 1000 mg/200 mL premix  Status:  Discontinued     1,000 mg 200 mL/hr over 60 Minutes Intravenous  Once 11/15/17 0359 11/15/17 0400   11/14/17 2130  ceFEPIme (MAXIPIME) 2 g in dextrose 5 % 50 mL IVPB  Status:  Discontinued     2 g 100 mL/hr over 30 Minutes Intravenous  Once 11/14/17 2124 11/14/17 2126   11/14/17 2130  vancomycin (VANCOCIN) IVPB  1000 mg/200 mL premix  Status:  Discontinued     1,000 mg 200 mL/hr over 60 Minutes Intravenous  Once 11/14/17 2124 11/14/17 2126   11/14/17 2130  vancomycin (VANCOCIN) 2,000 mg in sodium chloride 0.9 % 500 mL IVPB     2,000 mg 250 mL/hr over 120 Minutes Intravenous  Once 11/14/17 2129 11/15/17 0031   11/14/17 2130  ceFEPIme (MAXIPIME) 2 g in dextrose  5 % 50 mL IVPB     2 g 100 mL/hr over 30 Minutes Intravenous  Once 11/14/17 2129 11/14/17 2231      Procedures: None  CONSULTS:  None  Time spent: 25 minutes-Greater than 50% of this time was spent in counseling, explanation of diagnosis, planning of further management, and coordination of care.  MEDICATIONS: Scheduled Meds: . albuterol  2.5 mg Nebulization Q6H  . heparin  5,000 Units Subcutaneous Q8H  . insulin aspart  0-9 Units Subcutaneous Q4H  . insulin glargine  10 Units Subcutaneous QHS  . metoprolol tartrate  2.5 mg Intravenous Q6H  . oseltamivir  30 mg Oral BID   Continuous Infusions: . sodium chloride 100 mL/hr at 11/15/17 0425  . piperacillin-tazobactam (ZOSYN)  IV    . vancomycin     PRN Meds:.acetaminophen **OR** acetaminophen, albuterol, hydrALAZINE, ondansetron **OR** ondansetron (ZOFRAN) IV   PHYSICAL EXAM: Vital signs: Vitals:   11/14/17 2330 11/15/17 0109 11/15/17 0525 11/15/17 0735  BP: 118/77 124/75 (!) 132/92   Pulse: (!) 104 (!) 103 88   Resp: (!) 29 (!) 21    Temp:  98.3 F (36.8 C)    TempSrc:  Oral    SpO2: 94% 95%  95%   There were no vitals filed for this visit. There is no height or weight on file to calculate BMI.   General appearance :Awake, alert, not in any distress.  Chronically sick appearing Eyes:, pupils equally reactive to light and accomodation HEENT: Atraumatic and Normocephalic Neck: supple, no JVD.  Resp:Good air entry bilaterally, scattered rhonchi and few bibasilar rales.+transmitted upper airway sounds as well CVS: S1 S2 regular, no murmurs.  GI: Bowel sounds present, Non tender and not distended with no gaurding, rigidity or rebound.No organomegaly Extremities: B/L Lower Ext shows no edema, both legs are warm to touch Neurology:  speech clear,Non focal-but with generalized weakness Musculoskeletal:No digital cyanosis Skin:No Rash, warm and dry Wounds:N/A  I have personally reviewed following labs and imaging  studies  LABORATORY DATA: CBC: Recent Labs  Lab 11/14/17 2130 11/14/17 2137 11/15/17 0414  WBC 19.4*  --  16.6*  NEUTROABS 16.1*  --  12.5*  HGB 13.7 14.6 11.4*  HCT 39.5 43.0 34.5*  MCV 84.8  --  85.0  PLT 417*  --  774    Basic Metabolic Panel: Recent Labs  Lab 11/14/17 2130 11/14/17 2137 11/15/17 0414  NA 136 136 135  K 4.0 3.8 3.6  CL 98* 94* 102  CO2 27  --  24  GLUCOSE 320* 323* 305*  BUN 32* 34* 27*  CREATININE 1.68* 1.40* 1.36*  CALCIUM 9.1  --  8.0*    GFR: Estimated Creatinine Clearance: 55.1 mL/min (A) (by C-G formula based on SCr of 1.36 mg/dL (H)).  Liver Function Tests: Recent Labs  Lab 11/14/17 2130 11/15/17 0414  AST 47* 31  ALT 36 29  ALKPHOS 98 75  BILITOT 0.6 0.8  PROT 8.0 6.6  ALBUMIN 2.9* 2.4*   No results for input(s): LIPASE, AMYLASE in the last 168 hours. No results for  input(s): AMMONIA in the last 168 hours.  Coagulation Profile: Recent Labs  Lab 11/15/17 0414  INR 1.15    Cardiac Enzymes: Recent Labs  Lab 11/15/17 0926  TROPONINI 0.03*    BNP (last 3 results) No results for input(s): PROBNP in the last 8760 hours.  HbA1C: No results for input(s): HGBA1C in the last 72 hours.  CBG: Recent Labs  Lab 11/15/17 0422 11/15/17 0721  GLUCAP 306* 263*    Lipid Profile: No results for input(s): CHOL, HDL, LDLCALC, TRIG, CHOLHDL, LDLDIRECT in the last 72 hours.  Thyroid Function Tests: No results for input(s): TSH, T4TOTAL, FREET4, T3FREE, THYROIDAB in the last 72 hours.  Anemia Panel: No results for input(s): VITAMINB12, FOLATE, FERRITIN, TIBC, IRON, RETICCTPCT in the last 72 hours.  Urine analysis:    Component Value Date/Time   COLORURINE YELLOW 11/15/2017 0428   APPEARANCEUR HAZY (A) 11/15/2017 0428   LABSPEC 1.023 11/15/2017 0428   PHURINE 5.0 11/15/2017 0428   GLUCOSEU >=500 (A) 11/15/2017 0428   HGBUR NEGATIVE 11/15/2017 0428   BILIRUBINUR NEGATIVE 11/15/2017 0428   KETONESUR 5 (A) 11/15/2017 0428    PROTEINUR 30 (A) 11/15/2017 0428   UROBILINOGEN 1.0 02/23/2012 0047   NITRITE NEGATIVE 11/15/2017 0428   LEUKOCYTESUR NEGATIVE 11/15/2017 0428    Sepsis Labs: Lactic Acid, Venous    Component Value Date/Time   LATICACIDVEN 1.6 11/15/2017 0648    MICROBIOLOGY: Recent Results (from the past 240 hour(s))  MRSA PCR Screening     Status: None   Collection Time: 11/15/17  4:23 AM  Result Value Ref Range Status   MRSA by PCR NEGATIVE NEGATIVE Final    Comment:        The GeneXpert MRSA Assay (FDA approved for NASAL specimens only), is one component of a comprehensive MRSA colonization surveillance program. It is not intended to diagnose MRSA infection nor to guide or monitor treatment for MRSA infections.     RADIOLOGY STUDIES/RESULTS: Dg Chest 2 View  Result Date: 11/14/2017 CLINICAL DATA:  Cough, fever, and lethargy. EXAM: CHEST  2 VIEW COMPARISON:  04/06/2017 FINDINGS: Shallow inspiration. Cardiac enlargement with mild pulmonary vascular congestion. Perihilar infiltration likely representing edema. No focal consolidation. No blunting of costophrenic angles. No pneumothorax. Degenerative changes in the spine. IMPRESSION: Shallow inspiration. Cardiac enlargement with mild vascular congestion and perihilar edema. Electronically Signed   By: Lucienne Capers M.D.   On: 11/14/2017 21:47   Ct Head Wo Contrast  Result Date: 11/15/2017 CLINICAL DATA:  Per Dr. Hal Hope notes: HPI: SOLAN VOSLER is a 84 y.o. male with history of diabetes mellitus type 2, chronic kidney disease, hypertension and atrial flutter was brought to the ER after patient was found to be increasingly hypoxic and febrile. EXAM: CT HEAD WITHOUT CONTRAST TECHNIQUE: Contiguous axial images were obtained from the base of the skull through the vertex without intravenous contrast. COMPARISON:  02/19/2012 FINDINGS: Brain: There is central and cortical atrophy. Periventricular white matter changes are consistent with small  vessel disease. There is no intra or extra-axial fluid collection or mass lesion. The basilar cisterns and ventricles have a normal appearance. There is no CT evidence for acute infarction or hemorrhage. Vascular: There is atherosclerotic calcifications of the carotic siphons. Skull: Normal. Negative for fracture or focal lesion. Sinuses/Orbits: There are air-fluid levels within the sphenoid air cells and moderate mucoperiosteal thickening within the paranasal sinuses. The mastoid air cells are normally aerated. Other: None IMPRESSION: 1. Atrophy and small vessel disease. 2.  No evidence for  acute intracranial abnormality. 3. Changes compatible with acute and chronic sinusitis. Electronically Signed   By: Nolon Nations M.D.   On: 11/15/2017 09:05     LOS: 1 day   Oren Binet, MD  [facility] Pager:[phone]  If 7PM-7AM, please contact night-coverage [email or web address] Password [facility] 11/15/2017, 10:48 AM

## 2017-11-15 NOTE — Evaluation (Signed)
Clinical/Bedside Swallow Evaluation Patient Details  Name: Preston Moore MRN: [id number] Date of Birth: May 04, 1934  Today's Date: 11/15/2017 Time: SLP Start Time (ACUTE ONLY): 1147 SLP Stop Time (ACUTE ONLY): 1207 SLP Time Calculation (min) (ACUTE ONLY): 20 min  Past Medical History:  Past Medical History:  Diagnosis Date  . Atrial flutter ([place])   . B12 deficiency 02/22/2012  . Closed right hip fracture, initial encounter ([place]) 04/06/2017  . Diabetes mellitus   . Hyperlipidemia   . Hypertension   . Ileus ([place]) 02/29/2012  . Malnutrition ([place]) 02/29/2012  . Neuropathy   . Non compliance with medical treatment 02/29/2012  . Pancreatitis 02/22/2012  . SBO (small bowel obstruction) ([place]) 02/23/2012  . Syncope and collapse    Past Surgical History:  Past Surgical History:  Procedure Laterality Date  . BACK SURGERY    . COMPRESSION HIP SCREW Right 04/07/2017   Procedure: RIGHT HIP INTRATROCHANTERIC NAILING;  Surgeon: [name], MD;  Location: [facility];  Service: Orthopedics;  Laterality: Right;  . LAMINECTOMY  1965   HPI:  Preston Moore is a 84 y.o. male with history of diabetes mellitus type 2, chronic kidney disease, hypertension and atrial flutter was brought to the ER after patient was found to be increasingly hypoxic and febrile.  As per the ER physician's note patient's nursing home has been found to have increasing cases of influenza and patient is empirically placed on Tamiflu.  Patient appears confused and is not able to provide history.   Assessment / Plan / Recommendation Clinical Impression  Pt presents with functional oropharyngeal swallow based on clinical swallow evaluation.  He does have baseline congested cough - but this was not coorelated to po intake.  No indication of dysphagia or airway compromise during intake. Did not administer 3 ounce water test due to pt's hiccups and risk.  However pt denies dysphagia prior to admit.     SLP to sign off, no SLP follow up  indicated.   SLP Visit Diagnosis: Dysphagia, unspecified (R13.10)    Aspiration Risk  Mild aspiration risk    Diet Recommendation Regular;Thin liquid   Liquid Administration via: Cup;Straw Medication Administration: Whole meds with liquid Supervision: Patient able to self feed Compensations: Slow rate;Small sips/bites Postural Changes: Seated upright at 90 degrees;Remain upright for at least 30 minutes after po intake    Other  Recommendations Oral Care Recommendations: Oral care BID   Follow up Recommendations None      Frequency and Duration      n/a      Prognosis   n/a     Swallow Study   General Date of Onset: 11/15/17 HPI: Preston Moore is a 84 y.o. male with history of diabetes mellitus type 2, chronic kidney disease, hypertension and atrial flutter was brought to the ER after patient was found to be increasingly hypoxic and febrile.  As per the ER physician's note patient's nursing home has been found to have increasing cases of influenza and patient is empirically placed on Tamiflu.  Patient appears confused and is not able to provide history. Type of Study: Bedside Swallow Evaluation Diet Prior to this Study: NPO Temperature Spikes Noted: Yes(102.2) Respiratory Status: Nasal cannula History of Recent Intubation: No Behavior/Cognition: Alert;Cooperative;Pleasant mood Oral Cavity Assessment: Within Functional Limits Oral Care Completed by SLP: No Oral Cavity - Dentition: Poor condition Vision: Functional for self-feeding Self-Feeding Abilities: Able to feed self Patient Positioning: Upright in bed Baseline Vocal Quality: Hoarse Volitional Cough: Congested Volitional Swallow:  Able to elicit    Oral/Motor/Sensory Function Overall Oral Motor/Sensory Function: Within functional limits   Ice Chips Ice chips: Within functional limits Presentation: Spoon   Thin Liquid Thin Liquid: Within functional limits Presentation: Cup;Straw;Self Fed    Nectar Thick Nectar  Thick Liquid: Not tested   Honey Thick Honey Thick Liquid: Not tested   Puree Puree: Within functional limits Presentation: Self Fed;Spoon   Solid   GO   Solid: Within functional limits Presentation: Self Fredirick Lathe 11/15/2017,12:13 PM  Luanna Salk, [facility] SLP [phone]

## 2017-11-15 NOTE — Progress Notes (Signed)
Pharmacy - Brief Note  Based on SCr this am, change vancomycin to 1250mg  IV q24h - monitor daily SCr d/t increase risk of AKI with vanco/zosyn - Influenza A POSITIVE - If no concern for wound infection, suggest narrow antibiotics (narrow to cover aspiration as warranted)   Doreene Eland, PharmD, BCPS.   Pager: [phone] 11/15/2017 9:35 AM

## 2017-11-15 NOTE — Consult Note (Signed)
[place] Nurse wound consult note Reason for Consult: MASD to buttocks, perineal area and medial thighs, MASD to the intertriginous area of the subpannicular skin fold, Unstageable pressure injuries to the bilateral heels, R>L Wound type:Moisture, pressure Pressure Injury POA: Yes Measurement: Left heel:  3cm x 2cm area of eschar in a non-blanching area measuring 5cm round. Right heel:  Area of non-blanching erythema measuring 8cm x 9cm with three areas of black eschar, the largest of which measures 3cm x 3.6cm.  Bilateral buttocks with MASD measuring 4cm x 3cm x 0.1cm on each buttock, red, moist wound bed, scant serous exudate Wound bed:As described above Drainage (amount, consistency, odor) As described above Periwound: Heels are boggy, buttocks are red, blanching and dry Dressing procedure/placement/frequency: Patient will be turned from side to side and time in the supine position minimized.  HOB at or below a 30 degree angle.  Bilateral pressure redistribution heel boots are provided as well as guidance for Nursing for topical care using xeroform gauze dressing twice daily. I have provided our house antimicrobial textile ([employer]+) for use in the subpannicular skin fold to wick away excess moisture.  The prescriptive Gerhart's Butt Cream (1:1:1 lotrimin, hydrocortisone and zinc oxide) is provided three times daily to the buttocks, perineal area and medial thighs to resolve the MASD.   [place] nursing team will not follow, but will remain available to this patient, the nursing and medical teams.  Please re-consult if needed. Thanks, Maudie Flakes, MSN, RN, [place], Arther Abbott  Pager# [phone]

## 2017-11-15 NOTE — Progress Notes (Signed)
Initial Nutrition Assessment  DOCUMENTATION CODES:   Obesity unspecified  INTERVENTION:    Provide Premier Protein BID, each supplement provides 160kcal and 30g protein.   Provide MVI daily  NUTRITION DIAGNOSIS:   Increased nutrient needs related to wound healing as evidenced by estimated needs.  GOAL:   Patient will meet greater than or equal to 90% of their needs  MONITOR:   PO intake, Supplement acceptance, Weight trends, Labs  REASON FOR ASSESSMENT:   Malnutrition Screening Tool    ASSESSMENT:   Pt with PMH significant for DM, HLD, CKD, HTN, pancreatitis, and SBO. Presents this admission with complaints of breathing difficulty. Admitted for sepsis likely from influenza and acute encephalopathy.    Pt confused upon assessment. No family at bedside. Denies any loss in appetite or weight loss. No meal completions charted. SLP saw pt this morning and recommended regular consistency diet with thin liquids. Today's lunch is the first meal pt will eat. Will monitor for toleration. Will provide supplementation and multivitamin for wound healing.   Weight records show pt has maintained his weight of 240-247 lb since June 2018. Do not suspect recent wt loss. Nutrition-Focused physical exam completed. Will try to obtain more history if possible.   Medications reviewed and include: 20 mg lasix, SSI, Lantus, IV abx Labs reviewed: CBG 305 (H)   NUTRITION - FOCUSED PHYSICAL EXAM:    Most Recent Value  Orbital Region  No depletion  Upper Arm Region  No depletion  Thoracic and Lumbar Region  Unable to assess  Buccal Region  No depletion  Temple Region  No depletion  Clavicle Bone Region  No depletion  Clavicle and Acromion Bone Region  No depletion  Scapular Bone Region  Unable to assess  Dorsal Hand  No depletion  Patellar Region  No depletion  Anterior Thigh Region  No depletion  Posterior Calf Region  No depletion  Edema (RD Assessment)  None  Hair  Reviewed  Eyes   Reviewed  Mouth  Unable to assess  Skin  Reviewed  Nails  Reviewed     Diet Order:  Diet Carb Modified Fluid consistency: Thin; Room service appropriate? Yes  EDUCATION NEEDS:   Not appropriate for education at this time  Skin:  Skin Assessment: Skin Integrity Issues: Skin Integrity Issues:: Stage II Stage II: buttocks  Last BM:  11/15/17  Height:   Ht Readings from Last 1 Encounters:  11/08/17 6' (1.829 m)    Weight:   Wt Readings from Last 1 Encounters:  11/12/17 247 lb 3.2 oz (112.1 kg)    Ideal Body Weight:  80.9 kg  BMI:  There is no height or weight on file to calculate BMI.  Estimated Nutritional Needs:   Kcal:  2000-2200 kcal/day  Protein:  100-110 g/day  Fluid:  > 2 L/day    Mariana Single RD, LDN Clinical Nutrition Pager # - [phone]

## 2017-11-15 NOTE — Care Management Note (Signed)
Case Management Note  Patient Details  Name: Preston Moore MRN: [id number] Date of Birth: [birth year]  Subjective/Objective:                  Sepsis and influenza Action/Plan: Date: November 15, 2017 Velva Harman, BSN, [place], [place] Chart and notes review for patient progress and needs. Will follow for case management and discharge needs. No cm or discharge needs present at time of this review. Next review date: [id number]  Expected Discharge Date:                  Expected Discharge Plan:  Home/Self Care  In-House Referral:     Discharge planning Services  CM Consult  Post Acute Care Choice:    Choice offered to:     DME Arranged:    DME Agency:     HH Arranged:    HH Agency:     Status of Service:  In process, will continue to follow  If discussed at Long Length of Stay Meetings, dates discussed:    Additional Comments:  Leeroy Cha, RN 11/15/2017, 8:40 AM

## 2017-11-15 NOTE — Progress Notes (Signed)
Pts heels dressed and wrapped bilaterally. Prevalon boots placed and pt repositioned

## 2017-11-15 NOTE — Progress Notes (Signed)
Inpatient Diabetes Program Recommendations  AACE/ADA: New Consensus Statement on Inpatient Glycemic Control (2015)  Target Ranges:  Prepandial:   less than 140 mg/dL      Peak postprandial:   less than 180 mg/dL (1-2 hours)      Critically ill patients:  140 - 180 mg/dL   Lab Results  Component Value Date   GLUCAP 257 (H) 11/15/2017   HGBA1C 9.9 09/27/2017    Review of Glycemic Control  Diabetes history: DM2 Outpatient Diabetes medications: Lantus 52 units QHS, Novolog 11 units tidwc Current orders for Inpatient glycemic control: Lantus 25 units QHS, Novolog 0-9 units Q4H  Inpatient Diabetes Program Recommendations:     Agree with orders. If pt eating > 50% meals, change Novolog to 0-9 units tidwc and hs May need meal coverage insulin if post-prandial blood sugars elevated. (On Novolog 11 units tidwc at home) Titrate Lantus if FBS > 180 mg/dL.  Will continue to follow daily.  Thank you. Lorenda Peck, RD, LDN, CDE Inpatient Diabetes Coordinator [phone]

## 2017-11-15 NOTE — H&P (Addendum)
History and Physical    Preston Moore IZT:[id number] DOB: 1934-04-12 DOA: 11/14/2017  PCP: Lajean Manes, MD  Patient coming from: Skilled nursing facility.  Chief Complaint: Fever and hypoxia.  HPI: Preston Moore is a 84 y.o. male with history of diabetes mellitus type 2, chronic kidney disease, hypertension and atrial flutter was brought to the ER after patient was found to be increasingly hypoxic and febrile.  As per the ER physician's note patient's nursing home has been found to have increasing cases of influenza and patient is empirically placed on Tamiflu.  Patient appears confused and is not able to provide history.  ED Course: In the ER patient is found to be wheezing and making gurgling respiratory sounds.  Patient responds to his name but otherwise appears confused.  Patient is hypoxic and chest x-ray shows congestion.  Patient is febrile tachycardic.  Elevated lactate levels and patient is being admitted for sepsis likely from influenza A which turned out to be positive.  This patient also has decubitus ulcers and has course respiratory sounds patient will be continued on antibiotics also.  ABG does not show any carbon dioxide retention.  Review of Systems: As per HPI, rest all negative.   Past Medical History:  Diagnosis Date  . Atrial flutter ([place])   . B12 deficiency 02/22/2012  . Closed right hip fracture, initial encounter ([place]) 04/06/2017  . Diabetes mellitus   . Hyperlipidemia   . Hypertension   . Ileus ([place]) 02/29/2012  . Malnutrition ([place]) 02/29/2012  . Neuropathy   . Non compliance with medical treatment 02/29/2012  . Pancreatitis 02/22/2012  . SBO (small bowel obstruction) ([place]) 02/23/2012  . Syncope and collapse     Past Surgical History:  Procedure Laterality Date  . BACK SURGERY    . COMPRESSION HIP SCREW Right 04/07/2017   Procedure: RIGHT HIP INTRATROCHANTERIC NAILING;  Surgeon: [name], MD;  Location: [facility];  Service: Orthopedics;  Laterality: Right;   . LAMINECTOMY  1965     reports that he has been smoking cigarettes.  He has a 5.00 pack-year smoking history. he has never used smokeless tobacco. He reports that he drinks about 21.6 oz of alcohol per week. He reports that he does not use drugs.  No Known Allergies  Family History  Problem Relation Age of Onset  . Hypertension Neg Hx   . Coronary artery disease Neg Hx   . Diabetes Neg Hx     Prior to Admission medications   Medication Sig Start Date End Date Taking? Authorizing Provider  acetaminophen (TYLENOL) 500 MG tablet Take 1,000 mg by mouth 3 (three) times daily. 09/28/17  Yes [provider]  albuterol (ACCUNEB) 0.63 MG/3ML nebulizer solution Take 1 ampule by nebulization every 6 (six) hours as needed for wheezing or shortness of breath.   Yes [provider]  Amino Acids-Protein Hydrolys (FEEDING SUPPLEMENT, PRO-STAT SUGAR FREE 64,) LIQD Take 30 mLs 2 (two) times daily by mouth.   Yes [provider]  atorvastatin (LIPITOR) 10 MG tablet Take 10 mg by mouth at bedtime. 10/11/17  Yes [provider]  docusate sodium (COLACE) 100 MG capsule Take 1 capsule (100 mg total) by mouth 2 (two) times daily. 04/10/17  Yes Reyne Dumas, MD  ferrous sulfate 325 (65 FE) MG tablet Take 325 mg by mouth daily with breakfast.    Yes [provider]  furosemide (LASIX) 20 MG tablet Take 20 mg by mouth daily.   Yes [provider]  gabapentin (NEURONTIN) 300 MG capsule Take 300 mg by mouth 3 (three) times daily.    Yes [provider]  HYDROcodone-acetaminophen (NORCO/VICODIN) 5-325 MG tablet Take 1 tablet by mouth every 6 (six) hours as needed for moderate pain. 10/23/17  Yes Gerlene Fee, NP  insulin aspart (NOVOLOG) 100 UNIT/ML injection Inject 11 Units into the skin 3 (three) times daily with meals. Inject 11 units subcutaneously after meals 10/26/17  Yes [provider]  insulin glargine (LANTUS) 100 UNIT/ML injection  Inject 52 Units into the skin at bedtime.    Yes [provider]  metoprolol succinate (TOPROL-XL) 25 MG 24 hr tablet Take 25 mg by mouth daily.   Yes [provider]  Multiple Vitamins-Minerals (MULTIVITAMIN ADULT) TABS Take 1 tablet by mouth daily.   Yes [provider]  Multiple Vitamins-Minerals (MULTIVITAMIN ADULTS PO) Take 1 capsule by mouth daily.   Yes [provider]  pantoprazole (PROTONIX) 40 MG tablet Take 40 mg by mouth daily.   Yes [provider]  pseudoephedrine-guaifenesin (MUCINEX D) 60-600 MG 12 hr tablet Take 1 tablet by mouth every 12 (twelve) hours.   Yes [provider]  Skin Protectants, Misc. (CALAZIME SKIN PROTECTANT EX) Apply 1 application topically 2 (two) times daily. Apply to buttock every shift    Yes [provider]  Skin Protectants, Misc. (EUCERIN) cream Apply to BLE topically two times daily for dry skin   Yes [provider]  traMADol (ULTRAM) 50 MG tablet Give 1/2 tablet (25mg ) by mouth two times daily 11/02/17  Yes Gerlene Fee, NP  traZODone (DESYREL) 100 MG tablet Take 100 mg at bedtime by mouth.    Yes [provider]  Cholecalciferol 50000 units TABS Give 1 tablet by mouth every Saturday    [provider]  Multiple Vitamins-Minerals (DECUBI-VITE) CAPS Take 1 capsule by mouth daily.    [provider]  polyethylene glycol (MIRALAX / GLYCOLAX) packet Take 17 g by mouth 2 (two) times daily. Patient taking differently: Take 17 g by mouth 2 (two) times daily as needed for moderate constipation.  04/10/17   Reyne Dumas, MD    Physical Exam: Vitals:   11/14/17 2241 11/14/17 2247 11/14/17 2330 11/15/17 0109  BP:  119/81 118/77 124/75  Pulse:  (!) 111 (!) 104 (!) 103  Resp:  (!) 25 (!) 29 (!) 21  Temp: (!) 100.4 F (38 C)   98.3 F (36.8 C)  TempSrc: Oral   Oral  SpO2:  93% 94% 95%      Constitutional: Moderately built and nourished. Vitals:    11/14/17 2241 11/14/17 2247 11/14/17 2330 11/15/17 0109  BP:  119/81 118/77 124/75  Pulse:  (!) 111 (!) 104 (!) 103  Resp:  (!) 25 (!) 29 (!) 21  Temp: (!) 100.4 F (38 C)   98.3 F (36.8 C)  TempSrc: Oral   Oral  SpO2:  93% 94% 95%   Eyes: Anicteric no pallor. ENMT: No discharge from the ears eyes nose or mouth. Neck: No mass felt.  No neck rigidity. Respiratory: Bilateral coarse breath sounds. Cardiovascular: S1-S2 heard tachycardic. Abdomen: Soft nontender bowel sounds present. Musculoskeletal: Bilateral feet ulcers.  Sacral decubitus ulcers. Skin: Sacral diabetic ulcers and heel ulcers. Neurologic: Patient is appearing confused responds to his name does not follow commands. Psychiatric: Appears confused.   Labs on Admission: I have personally reviewed following labs and imaging studies  CBC: Recent Labs  Lab 11/14/17 2130 11/14/17 2137  WBC  19.4*  --   NEUTROABS 16.1*  --   HGB 13.7 14.6  HCT 39.5 43.0  MCV 84.8  --   PLT 417*  --    Basic Metabolic Panel: Recent Labs  Lab 11/14/17 2130 11/14/17 2137  NA 136 136  K 4.0 3.8  CL 98* 94*  CO2 27  --   GLUCOSE 320* 323*  BUN 32* 34*  CREATININE 1.68* 1.40*  CALCIUM 9.1  --    GFR: Estimated Creatinine Clearance: 53.5 mL/min (A) (by C-G formula based on SCr of 1.4 mg/dL (H)). Liver Function Tests: Recent Labs  Lab 11/14/17 2130  AST 47*  ALT 36  ALKPHOS 98  BILITOT 0.6  PROT 8.0  ALBUMIN 2.9*   No results for input(s): LIPASE, AMYLASE in the last 168 hours. No results for input(s): AMMONIA in the last 168 hours. Coagulation Profile: No results for input(s): INR, PROTIME in the last 168 hours. Cardiac Enzymes: No results for input(s): CKTOTAL, CKMB, CKMBINDEX, TROPONINI in the last 168 hours. BNP (last 3 results) No results for input(s): PROBNP in the last 8760 hours. HbA1C: No results for input(s): HGBA1C in the last 72 hours. CBG: No results for input(s): GLUCAP in the last 168 hours. Lipid  Profile: No results for input(s): CHOL, HDL, LDLCALC, TRIG, CHOLHDL, LDLDIRECT in the last 72 hours. Thyroid Function Tests: No results for input(s): TSH, T4TOTAL, FREET4, T3FREE, THYROIDAB in the last 72 hours. Anemia Panel: No results for input(s): VITAMINB12, FOLATE, FERRITIN, TIBC, IRON, RETICCTPCT in the last 72 hours. Urine analysis:    Component Value Date/Time   COLORURINE AMBER (A) 02/23/2012 0047   APPEARANCEUR TURBID (A) 02/23/2012 0047   LABSPEC 1.021 02/23/2012 0047   PHURINE 5.5 02/23/2012 0047   GLUCOSEU 100 (A) 02/23/2012 0047   HGBUR NEGATIVE 02/23/2012 0047   BILIRUBINUR SMALL (A) 02/23/2012 0047   KETONESUR TRACE (A) 02/23/2012 0047   PROTEINUR 30 (A) 02/23/2012 0047   UROBILINOGEN 1.0 02/23/2012 0047   NITRITE NEGATIVE 02/23/2012 0047   LEUKOCYTESUR NEGATIVE 02/23/2012 0047   Sepsis Labs: @LABRCNTIP (procalcitonin:4,lacticidven:4) )No results found for this or any previous visit (from the past 240 hour(s)).   Radiological Exams on Admission: Dg Chest 2 View  Result Date: 11/14/2017 CLINICAL DATA:  Cough, fever, and lethargy. EXAM: CHEST  2 VIEW COMPARISON:  04/06/2017 FINDINGS: Shallow inspiration. Cardiac enlargement with mild pulmonary vascular congestion. Perihilar infiltration likely representing edema. No focal consolidation. No blunting of costophrenic angles. No pneumothorax. Degenerative changes in the spine. IMPRESSION: Shallow inspiration. Cardiac enlargement with mild vascular congestion and perihilar edema. Electronically Signed   By: Lucienne Capers M.D.   On: 11/14/2017 21:47    EKG: Independently reviewed.  Multifocal tachycardia.  Assessment/Plan Principal Problem:   Sepsis ([place]) Active Problems:   Ischemic cardiomyopathy   Iron deficiency anemia   CKD (chronic kidney disease), stage III ([place])   Essential hypertension   Type II diabetes mellitus with renal manifestations, uncontrolled ([place])   Acute encephalopathy    1. Sepsis likely  from influenza but since patient also has coarse breath sounds suspect patient could have aspirated and also has decubitus ulcers sacral and heel -after discussing with pharmacy and due to patient's chronic disease Tamiflu dose has been ordered.  Patient will be continued on empiric antibiotics also until we get cultures.  I have requested swallow evaluation until then patient will be n.p.o.  Follow lactate levels continue IV fluids. 2. Acute encephalopathy likely from sepsis.  Patient will be n.p.o. until swallow  evaluation available.  Will get CT head and ABG does not show any carbon dioxide retention. 3. Hypertension and history of atrial flutter -I have placed patient on IV metoprolol scheduled dose along with as needed IV hydralazine.  Closely follow blood pressure trends once patient can take orally and change back to oral medications. 4. Diabetes mellitus type 2 -since patient is n.p.o. for now I have decrease patient's Lantus dose from 50-10 units with sliding scale coverage.  Closely follow CBGs and change back to home dose once patient can take orally and patient's medications needs to be adjusted if patient's blood sugar remains high. 5. Chronic kidney disease stage III appears to be at baseline.  Follow metabolic panel. 6. History of ischemic cardiomyopathy as per the chart follow respiratory status.  Check troponins. 7. Iron deficiency anemia -follow CBC.   DVT prophylaxis: Heparin. Code Status: Full code. Family Communication: No family at the bedside. Disposition Plan: Skilled nursing facility. Consults called: Wound team. Admission status: Inpatient.   Rise Patience MD [facility] Pager [phone].  If 7PM-7AM, please contact night-coverage [email or web address] Password [email or web address]  11/15/2017, 4:00 AM

## 2017-11-15 NOTE — Progress Notes (Addendum)
CRITICAL VALUE ALERT  Critical Value:  troponin 0.03  Date & Time Notied:  1/31 1030  Provider Notified: yes MD page sent  Orders Received/Actions taken: awaiting orders

## 2017-11-15 NOTE — Progress Notes (Signed)
Pharmacy Antibiotic Note  LENNOX DOLBERRY is a 84 y.o. male admitted on 11/14/2017 with sepsis.  Pharmacy has been consulted for Vancomycin and Zosyn dosing.  Plan: Vancomycin 2gm iv x1, then 1750mg  iv q36hr  Goal AUC = 400 - 500 for all indications, except meningitis (goal AUC > 500 and Cmin 15-20 mcg/mL)   Zosyn 3.375gm iv x1, then Zosyn 3.375g IV Q8H infused over 4hrs.      Temp (24hrs), Avg:100.5 F (38.1 C), Min:98.3 F (36.8 C), Max:102.2 F (39 C)  Recent Labs  Lab 11/14/17 2130 11/14/17 2136 11/14/17 2137 11/14/17 2345 11/15/17 0414  WBC 19.4*  --   --   --  16.6*  CREATININE 1.68*  --  1.40*  --  1.36*  LATICACIDVEN  --  2.68*  --  1.19 1.1    Estimated Creatinine Clearance: 55.1 mL/min (A) (by C-G formula based on SCr of 1.36 mg/dL (H)).    No Known Allergies  Antimicrobials this admission: Vancomycin 11/14/2017 >> Zosyn 11/14/2017 >>   Dose adjustments this admission: -  Microbiology results: -  Thank you for allowing pharmacy to be a part of this patient's care.  Nani Skillern Crowford 11/15/2017 5:41 AM

## 2017-11-16 ENCOUNTER — Inpatient Hospital Stay (HOSPITAL_COMMUNITY): Payer: Medicare Other

## 2017-11-16 DIAGNOSIS — A419 Sepsis, unspecified organism: Principal | ICD-10-CM

## 2017-11-16 DIAGNOSIS — I509 Heart failure, unspecified: Secondary | ICD-10-CM

## 2017-11-16 LAB — ECHOCARDIOGRAM COMPLETE: Weight: 3735.47 oz

## 2017-11-16 LAB — COMPREHENSIVE METABOLIC PANEL
ALBUMIN: 2.4 g/dL — AB (ref 3.5–5.0)
ALT: 45 U/L (ref 17–63)
ANION GAP: 8 (ref 5–15)
AST: 53 U/L — AB (ref 15–41)
Alkaline Phosphatase: 71 U/L (ref 38–126)
BUN: 20 mg/dL (ref 6–20)
CHLORIDE: 104 mmol/L (ref 101–111)
CO2: 26 mmol/L (ref 22–32)
Calcium: 8.4 mg/dL — ABNORMAL LOW (ref 8.9–10.3)
Creatinine, Ser: 1.18 mg/dL (ref 0.61–1.24)
GFR calc Af Amer: >60 mL/min (ref >60)
GFR calc non Af Amer: 56 mL/min — ABNORMAL LOW (ref >60)
GLUCOSE: 222 mg/dL — AB (ref 65–99)
POTASSIUM: 3.4 mmol/L — AB (ref 3.5–5.1)
SODIUM: 138 mmol/L (ref 135–145)
Total Bilirubin: 0.5 mg/dL (ref 0.3–1.2)
Total Protein: 6.7 g/dL (ref 6.5–8.1)

## 2017-11-16 LAB — URINE CULTURE: CULTURE: NO GROWTH

## 2017-11-16 LAB — GLUCOSE, CAPILLARY
GLUCOSE-CAPILLARY: 198 mg/dL — AB (ref 65–99)
GLUCOSE-CAPILLARY: 210 mg/dL — AB (ref 65–99)
GLUCOSE-CAPILLARY: 234 mg/dL — AB (ref 65–99)
GLUCOSE-CAPILLARY: 291 mg/dL — AB (ref 65–99)
Glucose-Capillary: 231 mg/dL — ABNORMAL HIGH (ref 65–99)
Glucose-Capillary: 300 mg/dL — ABNORMAL HIGH (ref 65–99)
Glucose-Capillary: 301 mg/dL — ABNORMAL HIGH (ref 65–99)

## 2017-11-16 LAB — CBC
HCT: 34.4 % — ABNORMAL LOW (ref 39.0–52.0)
HEMOGLOBIN: 11.3 g/dL — AB (ref 13.0–17.0)
MCH: 27.8 pg (ref 26.0–34.0)
MCHC: 32.8 g/dL (ref 30.0–36.0)
MCV: 84.5 fL (ref 78.0–100.0)
Platelets: 366 10*3/uL (ref 150–400)
RBC: 4.07 MIL/uL — ABNORMAL LOW (ref 4.22–5.81)
RDW: 14.5 % (ref 11.5–15.5)
WBC: 10.4 10*3/uL (ref 4.0–10.5)

## 2017-11-16 MED ORDER — INSULIN GLARGINE 100 UNIT/ML ~~LOC~~ SOLN
35.0000 [IU] | Freq: Every day | SUBCUTANEOUS | Status: DC
  Administered 2017-11-16: 35 [IU] via SUBCUTANEOUS
  Filled 2017-11-16 (×2): qty 0.35

## 2017-11-16 MED ORDER — SODIUM CHLORIDE 0.9 % IV SOLN
3.0000 g | Freq: Four times a day (QID) | INTRAVENOUS | Status: DC
  Administered 2017-11-16 – 2017-11-17 (×5): 3 g via INTRAVENOUS
  Filled 2017-11-16 (×8): qty 3

## 2017-11-16 MED ORDER — ATORVASTATIN CALCIUM 10 MG PO TABS
10.0000 mg | ORAL_TABLET | Freq: Every day | ORAL | Status: DC
  Administered 2017-11-16: 10 mg via ORAL
  Filled 2017-11-16: qty 1

## 2017-11-16 MED ORDER — POTASSIUM CHLORIDE CRYS ER 20 MEQ PO TBCR
40.0000 meq | EXTENDED_RELEASE_TABLET | Freq: Once | ORAL | Status: AC
  Administered 2017-11-16: 40 meq via ORAL
  Filled 2017-11-16: qty 2

## 2017-11-16 MED ORDER — TRAZODONE HCL 100 MG PO TABS
100.0000 mg | ORAL_TABLET | Freq: Every day | ORAL | Status: DC
  Administered 2017-11-16: 100 mg via ORAL
  Filled 2017-11-16: qty 1

## 2017-11-16 MED ORDER — FUROSEMIDE 40 MG PO TABS
40.0000 mg | ORAL_TABLET | Freq: Every day | ORAL | Status: DC
  Administered 2017-11-17: 40 mg via ORAL
  Filled 2017-11-16: qty 1

## 2017-11-16 NOTE — Progress Notes (Addendum)
Inpatient Diabetes Program Recommendations  AACE/ADA: New Consensus Statement on Inpatient Glycemic Control (2015)  Target Ranges:  Prepandial:   less than 140 mg/dL      Peak postprandial:   less than 180 mg/dL (1-2 hours)      Critically ill patients:  140 - 180 mg/dL   Lab Results  Component Value Date   GLUCAP 301 (H) 11/16/2017   HGBA1C 9.9 09/27/2017    Review of Glycemic Control  Blood sugars uncontrolled, > 180 mg/dL. Needs insulin adjustment.  Inpatient Diabetes Program Recommendations:     Increase Lantus to 30 units QHS Novolog 0-9 units tidwc and hs Add Novolog 3 units tidwc for meal coverage insulin if pt eats > 50% meal.  Text-page sent to MD. Discussed with RN.  Thank you. Lorenda Peck, RD, LDN, CDE Inpatient Diabetes Coordinator [phone]

## 2017-11-16 NOTE — Progress Notes (Signed)
PROGRESS NOTE        PATIENT DETAILS Name: Preston Moore Age: 84 y.o. Sex: male Date of Birth: 1934/08/20 Admit Date: 11/14/2017 Admitting Physician Rise Patience, MD [id number], Christiane Ha, MD  Brief Narrative: Patient is a 84 y.o. male who was recently diagnosed with influenza and placed on Tamiflu brought to the hospital for evaluation of fever and worsening hypoxia.  Patient was subsequently admitted to the hospitalist service for further evaluation and treatment.  Subjective: Much more awake and alert compared to yesterday-breathing seems to have improved as well.  He was sitting up at bedside and eating breakfast this morning.  No family at bedside.    Assessment/Plan: Sepsis: Felt to be due to a combination of influenza and possible aspiration pneumonitis.  Improved-leukocytosis has resolved-previously on vancomycin and Zosyn-given clinical improvement-negative cultures-suspect we could narrow antibiotics down to just Unasyn.  Continue with Tamiflu.  Blood cultures negative so far.  Evaluated by speech therapy on 1/31-with no overt evidence of aspiration.  Acute metabolic encephalopathy: Likely secondary to sepsis-much improved this morning.  Much more awake and alert.  CT head on admission negative for acute abnormalities.  Hypertension: Controlled with metoprolol-continue to follow.   History of atrial flutter: Continue metoprolol-does not appear to be on anticoagulation in the outpatient setting-given advanced age, frailty-prior history of alcohol use-he may not be a good long-term candidate for anticoagulation.  Continue telemetry monitoring  Chronic diastolic heart failure: Compensated-continue Lasix.  Await 2D echocardiogram.   Chronic kidney disease stage III: Creatinine close to usual baseline-follow periodically.    Insulin-dependent DM-2: CBGs continue to stay elevated-increase Lantus to 35 units, continue SSI.  Follow and make changes  accordingly   Chronic narcotic use: Continue tramadol-minimize other narcotics as much as possible.    Chronic constipation: Continue MiraLAX  Chronic sacral and heel decubitus ulcers: Present prior to admission-appreciate wound care evaluation.   DVT Prophylaxis: Prophylactic Heparin   Code Status: Full code   Family Communication: None at bedside  Disposition Plan: Remain inpatient-back to SNF hopefully in the next day or so  Antimicrobial agents: Anti-infectives (From admission, onward)   Start     Dose/Rate Route Frequency Ordered Stop   11/16/17 1400  Ampicillin-Sulbactam (UNASYN) 3 g in sodium chloride 0.9 % 100 mL IVPB     3 g 200 mL/hr over 30 Minutes Intravenous Every 6 hours 11/16/17 0829     11/15/17 2200  vancomycin (VANCOCIN) 1,250 mg in sodium chloride 0.9 % 250 mL IVPB  Status:  Discontinued     1,250 mg 166.7 mL/hr over 90 Minutes Intravenous Every 24 hours 11/15/17 0713 11/16/17 0822   11/15/17 1400  piperacillin-tazobactam (ZOSYN) IVPB 3.375 g  Status:  Discontinued     3.375 g 12.5 mL/hr over 240 Minutes Intravenous Every 8 hours 11/15/17 0542 11/16/17 0829   11/15/17 0415  oseltamivir (TAMIFLU) capsule 30 mg     30 mg Oral 2 times daily 11/15/17 0359 11/20/17 0959   11/15/17 0400  piperacillin-tazobactam (ZOSYN) IVPB 3.375 g     3.375 g 100 mL/hr over 30 Minutes Intravenous  Once 11/15/17 0359 11/15/17 0455   11/15/17 0400  vancomycin (VANCOCIN) IVPB 1000 mg/200 mL premix  Status:  Discontinued     1,000 mg 200 mL/hr over 60 Minutes Intravenous  Once 11/15/17 0359 11/15/17 0400   11/14/17 2130  ceFEPIme (  MAXIPIME) 2 g in dextrose 5 % 50 mL IVPB  Status:  Discontinued     2 g 100 mL/hr over 30 Minutes Intravenous  Once 11/14/17 2124 11/14/17 2126   11/14/17 2130  vancomycin (VANCOCIN) IVPB 1000 mg/200 mL premix  Status:  Discontinued     1,000 mg 200 mL/hr over 60 Minutes Intravenous  Once 11/14/17 2124 11/14/17 2126   11/14/17 2130  vancomycin  (VANCOCIN) 2,000 mg in sodium chloride 0.9 % 500 mL IVPB     2,000 mg 250 mL/hr over 120 Minutes Intravenous  Once 11/14/17 2129 11/15/17 0031   11/14/17 2130  ceFEPIme (MAXIPIME) 2 g in dextrose 5 % 50 mL IVPB     2 g 100 mL/hr over 30 Minutes Intravenous  Once 11/14/17 2129 11/14/17 2231      Procedures: None  CONSULTS:  None  Time spent: 25 minutes-Greater than 50% of this time was spent in counseling, explanation of diagnosis, planning of further management, and coordination of care.  MEDICATIONS: Scheduled Meds: . albuterol  2.5 mg Nebulization TID  . atorvastatin  10 mg Oral QHS  . furosemide  20 mg Oral Daily  . gabapentin  300 mg Oral TID  . Gerhardt's butt cream   Topical TID  . guaiFENesin  1,200 mg Oral BID  . heparin  5,000 Units Subcutaneous Q8H  . insulin aspart  0-9 Units Subcutaneous Q4H  . insulin glargine  25 Units Subcutaneous QHS  . metoprolol succinate  25 mg Oral Daily  . multivitamin with minerals  1 tablet Oral Daily  . oseltamivir  30 mg Oral BID  . polyethylene glycol  17 g Oral Daily  . protein supplement shake  11 oz Oral BID BM  . traMADol  50 mg Oral Q12H  . traZODone  100 mg Oral QHS   Continuous Infusions: . ampicillin-sulbactam (UNASYN) IV     PRN Meds:.acetaminophen **OR** acetaminophen, albuterol, benzonatate, hydrALAZINE, metoprolol tartrate, ondansetron **OR** ondansetron (ZOFRAN) IV   PHYSICAL EXAM: Vital signs: Vitals:   11/15/17 2015 11/16/17 0405 11/16/17 0500 11/16/17 0730  BP: (!) 142/79 111/72    Pulse: 100 71    Resp: (!) 22 20    Temp: 98.9 F (37.2 C) 98.4 F (36.9 C)    TempSrc: Oral Oral    SpO2: 96% 96%  93%  Weight:   105.9 kg (233 lb 7.5 oz)    Filed Weights   11/16/17 0500  Weight: 105.9 kg (233 lb 7.5 oz)   Body mass index is 31.66 kg/m.   General appearance :Awake, alert, not in any distress.  Eyes:, pupils equally reactive to light and accomodation HEENT: Atraumatic and Normocephalic Neck:  supple, no JVD. Resp:Good air entry bilaterally, few bibasilar rales--transmitted upper airway sounds CVS: S1 S2 regular, no murmurs.  GI: Bowel sounds present, Non tender and not distended with no gaurding, rigidity or rebound. Extremities: B/L Lower Ext shows no edema, both legs are warm to touch Neurology:  speech clear,Non focal, sensation is grossly intact. Psychiatric: Normal judgment and insight. Normal mood. Musculoskeletal:No digital cyanosis Skin:No Rash, warm and dry Wounds:N/A  I have personally reviewed following labs and imaging studies  LABORATORY DATA: CBC: Recent Labs  Lab 11/14/17 2130 11/14/17 2137 11/15/17 0414 11/16/17 0550  WBC 19.4*  --  16.6* 10.4  NEUTROABS 16.1*  --  12.5*  --   HGB 13.7 14.6 11.4* 11.3*  HCT 39.5 43.0 34.5* 34.4*  MCV 84.8  --  85.0 84.5  PLT 417*  --  357 195    Basic Metabolic Panel: Recent Labs  Lab 11/14/17 2130 11/14/17 2137 11/15/17 0414 11/16/17 0550  NA 136 136 135 138  K 4.0 3.8 3.6 3.4*  CL 98* 94* 102 104  CO2 27  --  24 26  GLUCOSE 320* 323* 305* 222*  BUN 32* 34* 27* 20  CREATININE 1.68* 1.40* 1.36* 1.18  CALCIUM 9.1  --  8.0* 8.4*    GFR: Estimated Creatinine Clearance: 61.7 mL/min (by C-G formula based on SCr of 1.18 mg/dL).  Liver Function Tests: Recent Labs  Lab 11/14/17 2130 11/15/17 0414 11/16/17 0550  AST 47* 31 53*  ALT 36 29 45  ALKPHOS 98 75 71  BILITOT 0.6 0.8 0.5  PROT 8.0 6.6 6.7  ALBUMIN 2.9* 2.4* 2.4*   No results for input(s): LIPASE, AMYLASE in the last 168 hours. No results for input(s): AMMONIA in the last 168 hours.  Coagulation Profile: Recent Labs  Lab 11/15/17 0414  INR 1.15    Cardiac Enzymes: Recent Labs  Lab 11/15/17 0926  TROPONINI 0.03*    BNP (last 3 results) No results for input(s): PROBNP in the last 8760 hours.  HbA1C: No results for input(s): HGBA1C in the last 72 hours.  CBG: Recent Labs  Lab 11/15/17 2014 11/16/17 0048 11/16/17 0402  11/16/17 0729 11/16/17 1140  GLUCAP 317* 300* 231* 198* 291*    Lipid Profile: No results for input(s): CHOL, HDL, LDLCALC, TRIG, CHOLHDL, LDLDIRECT in the last 72 hours.  Thyroid Function Tests: No results for input(s): TSH, T4TOTAL, FREET4, T3FREE, THYROIDAB in the last 72 hours.  Anemia Panel: No results for input(s): VITAMINB12, FOLATE, FERRITIN, TIBC, IRON, RETICCTPCT in the last 72 hours.  Urine analysis:    Component Value Date/Time   COLORURINE YELLOW 11/15/2017 0428   APPEARANCEUR HAZY (A) 11/15/2017 0428   LABSPEC 1.023 11/15/2017 0428   PHURINE 5.0 11/15/2017 0428   GLUCOSEU >=500 (A) 11/15/2017 0428   HGBUR NEGATIVE 11/15/2017 0428   BILIRUBINUR NEGATIVE 11/15/2017 0428   KETONESUR 5 (A) 11/15/2017 0428   PROTEINUR 30 (A) 11/15/2017 0428   UROBILINOGEN 1.0 02/23/2012 0047   NITRITE NEGATIVE 11/15/2017 0428   LEUKOCYTESUR NEGATIVE 11/15/2017 0428    Sepsis Labs: Lactic Acid, Venous    Component Value Date/Time   LATICACIDVEN 1.6 11/15/2017 0648    MICROBIOLOGY: Recent Results (from the past 240 hour(s))  Blood culture (routine x 2)     Status: None (Preliminary result)   Collection Time: 11/14/17  9:29 PM  Result Value Ref Range Status   Specimen Description BLOOD RIGHT ANTECUBITAL  Final   Special Requests   Final    BOTTLES DRAWN AEROBIC AND ANAEROBIC Blood Culture adequate volume   Culture   Final    NO GROWTH < 12 HOURS Performed at [facility], [address], [place], [place] [address]    Report Status PENDING  Incomplete  Blood culture (routine x 2)     Status: None (Preliminary result)   Collection Time: 11/14/17  9:48 PM  Result Value Ref Range Status   Specimen Description BLOOD LEFT HAND  Final   Special Requests   Final    BOTTLES DRAWN AEROBIC AND ANAEROBIC Blood Culture adequate volume   Culture   Final    NO GROWTH < 12 HOURS Performed at [facility], [place] [address] [place],  [address]    Report Status  PENDING  Incomplete  Urine culture     Status: None  Collection Time: 11/15/17  4:23 AM  Result Value Ref Range Status   Specimen Description   Final    URINE, CLEAN CATCH Performed at [facility], [place] [address], [place], [place] [address]    Special Requests   Final    NONE Performed at [facility], [place] [address], [place], [place] [address]    Culture   Final    NO GROWTH Performed at [facility], [place] [address], [place], [place] [address]    Report Status 11/16/2017 FINAL  Final  MRSA PCR Screening     Status: None   Collection Time: 11/15/17  4:23 AM  Result Value Ref Range Status   MRSA by PCR NEGATIVE NEGATIVE Final    Comment:        The GeneXpert MRSA Assay (FDA approved for NASAL specimens only), is one component of a comprehensive MRSA colonization surveillance program. It is not intended to diagnose MRSA infection nor to guide or monitor treatment for MRSA infections.     RADIOLOGY STUDIES/RESULTS: Dg Chest 2 View  Result Date: 11/14/2017 CLINICAL DATA:  Cough, fever, and lethargy. EXAM: CHEST  2 VIEW COMPARISON:  04/06/2017 FINDINGS: Shallow inspiration. Cardiac enlargement with mild pulmonary vascular congestion. Perihilar infiltration likely representing edema. No focal consolidation. No blunting of costophrenic angles. No pneumothorax. Degenerative changes in the spine. IMPRESSION: Shallow inspiration. Cardiac enlargement with mild vascular congestion and perihilar edema. Electronically Signed   By: Lucienne Capers M.D.   On: 11/14/2017 21:47   Ct Head Wo Contrast  Result Date: 11/15/2017 CLINICAL DATA:  Per Dr. Hal Hope notes: HPI: DAJION BICKFORD is a 84 y.o. male with history of diabetes mellitus type 2, chronic kidney disease, hypertension and atrial flutter was brought to the ER after patient was found to be increasingly hypoxic and febrile. EXAM: CT HEAD WITHOUT CONTRAST TECHNIQUE: Contiguous axial images  were obtained from the base of the skull through the vertex without intravenous contrast. COMPARISON:  02/19/2012 FINDINGS: Brain: There is central and cortical atrophy. Periventricular white matter changes are consistent with small vessel disease. There is no intra or extra-axial fluid collection or mass lesion. The basilar cisterns and ventricles have a normal appearance. There is no CT evidence for acute infarction or hemorrhage. Vascular: There is atherosclerotic calcifications of the carotic siphons. Skull: Normal. Negative for fracture or focal lesion. Sinuses/Orbits: There are air-fluid levels within the sphenoid air cells and moderate mucoperiosteal thickening within the paranasal sinuses. The mastoid air cells are normally aerated. Other: None IMPRESSION: 1. Atrophy and small vessel disease. 2.  No evidence for acute intracranial abnormality. 3. Changes compatible with acute and chronic sinusitis. Electronically Signed   By: Nolon Nations M.D.   On: 11/15/2017 09:05     LOS: 2 days   Oren Binet, MD  [facility] Pager:[phone]  If 7PM-7AM, please contact night-coverage [email or web address] Password [email or web address] 11/16/2017, 1:16 PM

## 2017-11-16 NOTE — Progress Notes (Signed)
2D Echocardiogram has been performed.  Preston Moore 11/16/2017, 1:16 PM

## 2017-11-17 DIAGNOSIS — I255 Ischemic cardiomyopathy: Secondary | ICD-10-CM

## 2017-11-17 DIAGNOSIS — E1129 Type 2 diabetes mellitus with other diabetic kidney complication: Secondary | ICD-10-CM

## 2017-11-17 DIAGNOSIS — E1165 Type 2 diabetes mellitus with hyperglycemia: Secondary | ICD-10-CM

## 2017-11-17 LAB — BASIC METABOLIC PANEL
Anion gap: 8 (ref 5–15)
BUN: 16 mg/dL (ref 6–20)
CALCIUM: 8.5 mg/dL — AB (ref 8.9–10.3)
CO2: 26 mmol/L (ref 22–32)
CREATININE: 1.15 mg/dL (ref 0.61–1.24)
Chloride: 103 mmol/L (ref 101–111)
GFR calc non Af Amer: 58 mL/min — ABNORMAL LOW (ref >60)
Glucose, Bld: 189 mg/dL — ABNORMAL HIGH (ref 65–99)
Potassium: 3.5 mmol/L (ref 3.5–5.1)
SODIUM: 137 mmol/L (ref 135–145)

## 2017-11-17 LAB — GLUCOSE, CAPILLARY
Glucose-Capillary: 188 mg/dL — ABNORMAL HIGH (ref 65–99)
Glucose-Capillary: 194 mg/dL — ABNORMAL HIGH (ref 65–99)
Glucose-Capillary: 280 mg/dL — ABNORMAL HIGH (ref 65–99)
Glucose-Capillary: 263 mg/dL — ABNORMAL HIGH (ref 65–99)

## 2017-11-17 MED ORDER — GERHARDT'S BUTT CREAM: 1.0000 "application " | TOPICAL_CREAM | Freq: Three times a day (TID) | CUTANEOUS | Status: DC

## 2017-11-17 MED ORDER — OSELTAMIVIR PHOSPHATE 30 MG PO CAPS: 30.0000 mg | ORAL_CAPSULE | Freq: Two times a day (BID) | ORAL | Status: DC

## 2017-11-17 MED ORDER — ALBUTEROL SULFATE 0.63 MG/3ML IN NEBU: 1.0000 | INHALATION_SOLUTION | RESPIRATORY_TRACT | 12 refills | Status: DC | PRN

## 2017-11-17 MED ORDER — FUROSEMIDE 20 MG PO TABS: 40.0000 mg | ORAL_TABLET | Freq: Every day | ORAL | Status: DC

## 2017-11-17 MED ORDER — ALBUTEROL SULFATE (2.5 MG/3ML) 0.083% IN NEBU: 2.5000 mg | INHALATION_SOLUTION | Freq: Three times a day (TID) | RESPIRATORY_TRACT | 12 refills | Status: DC

## 2017-11-17 MED ORDER — LISINOPRIL 2.5 MG PO TABS: 5.0000 mg | ORAL_TABLET | Freq: Every day | ORAL | 11 refills | Status: DC

## 2017-11-17 MED ORDER — POTASSIUM CHLORIDE CRYS ER 20 MEQ PO TBCR: 20.0000 meq | EXTENDED_RELEASE_TABLET | Freq: Every day | ORAL | Status: DC

## 2017-11-17 MED ORDER — TRAZODONE HCL 100 MG PO TABS: 100.0000 mg | ORAL_TABLET | Freq: Every day | ORAL | 0 refills | Status: DC

## 2017-11-17 MED ORDER — BENZONATATE 200 MG PO CAPS: 200.0000 mg | ORAL_CAPSULE | Freq: Three times a day (TID) | ORAL | 0 refills | Status: DC | PRN

## 2017-11-17 MED ORDER — GUAIFENESIN ER 600 MG PO TB12: 1200.0000 mg | ORAL_TABLET | Freq: Two times a day (BID) | ORAL | Status: DC

## 2017-11-17 MED ORDER — POTASSIUM CHLORIDE CRYS ER 20 MEQ PO TBCR
40.0000 meq | EXTENDED_RELEASE_TABLET | Freq: Once | ORAL | Status: AC
  Administered 2017-11-17: 40 meq via ORAL
  Filled 2017-11-17: qty 2

## 2017-11-17 MED ORDER — AMOXICILLIN-POT CLAVULANATE 875-125 MG PO TABS: 1.0000 | ORAL_TABLET | Freq: Two times a day (BID) | ORAL | Status: DC

## 2017-11-17 MED ORDER — PREMIER PROTEIN SHAKE: 11.0000 [oz_av] | Freq: Two times a day (BID) | ORAL | 0 refills | Status: DC

## 2017-11-17 NOTE — Progress Notes (Addendum)
CSW received a call from pt's RN stating pt is ready for D/C.  CSW called Shaquenia at ph: [phone] who will call back with numbner for report and room number.  CSW spoke to [employer] pt's POA and daughter at [phone]   CSW will continue to follow for D/C needs.  Alphonse Guild. Tywanda Rice, LCSW, LCAS, CSI Clinical Social Worker Ph: [phone]

## 2017-11-17 NOTE — Progress Notes (Addendum)
CSW received a call from [place] is able to return to: [place] Number for report is: [phone] Pt's unit/room/bed number will be: 1-A Accepting physician: SNF MD  Pt can arrive ASAP on 11/17/17  CSW will update RN and EDP.  Alphonse Guild. Aicha Clingenpeel, LCSW, LCAS, CSI Clinical Social Worker Ph: [phone]

## 2017-11-17 NOTE — Progress Notes (Signed)
Report called to [place] at [place]. Pt is ready to travel to [place] with [facility].

## 2017-11-17 NOTE — Discharge Summary (Signed)
PATIENT DETAILS Name: Preston Moore Age: 84 y.o. Sex: male Date of Birth: [birth year] MRN: [id number]. Admitting Physician: Rise Patience, MD DJM:[id number], Christiane Ha, MD  Admit Date: 11/14/2017 Discharge date: 11/17/2017  Recommendations for Outpatient Follow-up:  1. Follow up with PCP in 1-2 weeks 2. Please obtain BMP/CBC in one week 3. Please ensure follow-up with cardiology 4. Please follow up on the following pending results: Blood cultures until final  Admitted From:  SNF  Disposition: SNF   Home Health: No  Equipment/Devices: None  Discharge Condition: Stable  CODE STATUS: FULL CODE  Diet recommendation:  Heart Healthy / Carb Modified   Brief Summary: See H&P, Labs, Consult and Test reports for all details in brief, Patient is a 84 y.o. male who was recently diagnosed with influenza and placed on Tamiflu brought to the hospital for evaluation of fever and worsening hypoxia.  Patient was subsequently admitted to the hospitalist service for further evaluation and treatment.  Brief Hospital Course: Sepsis: Felt to be due to a combination of influenza and possible aspiration pneumonitis.  Treated with Tamiflu, and empiric vancomycin and Zosyn.  Clinically improved-blood cultures were negative.  Due to clinical improvement-antimicrobial agents were tapered down to just Unasyn-and since patient continues to improve-he is being discharged back to his SNF-he will be transitioned to Augmentin for a few more days.  He will continue with Tamiflu for another 5 more doses. Evaluated by speech therapy on 1/31-with no overt evidence of aspiration.  Consider repeat chest x-ray and 4-6 weeks.  Acute metabolic encephalopathy: Likely secondary to sepsis-much improved-after treatment of underlying infectious issues as noted above.  I suspect he is back to his usual baseline.  He is following commands this morning-answers most of my questions appropriately-he seems to be engaging in  some conversation with his daughter at bedside as well. CT head on admission negative for acute abnormalities.  Newly diagnosed systolic heart failure: 2D echocardiogram done this admission shows EF of around 35%.  He is already on metoprolol-we will go ahead and start him on low-dose lisinopril.  He has been continued on Lasix.  This was mostly incidental finding-he does not have chest pain-does not have overt/decompensated heart failure.  Could be related to underlying sepsis.  He is in poor overall health-mostly bedbound-and probably is not a good candidate for aggressive care.  I have spoken with his daughter at bedside this morning-I have recommended that we try medical treatment with beta blockers, ACE inhibitor/diuretics-and if blood pressure tolerates over the next few days-add spironolactone.  I have asked her to see if we can get this patient reestablish with cardiology-he has seen Dr. Caryl Comes in the past.  Hypertension: Controlled with metoprolol-continue to follow.   History of atrial flutter: Continue metoprolol-does not appear to be on anticoagulation in the outpatient setting-given advanced age, frailty-prior history of alcohol use-he may not be a good long-term candidate for anticoagulation.    Chronic kidney disease stage III: Creatinine close to usual baseline-follow periodically.    Insulin-dependent DM-2: CBGs continue to stay elevated-will increase Lantus back to his usual home regimen on discharge  Chronic narcotic use: Continue tramadol-minimize other narcotics as much as possible.    Chronic constipation: Continue MiraLAX  Chronic sacral and heel decubitus ulcers: Present prior to admission-appreciate wound care evaluation.   Sacral ulcer felt secondary to be due to moisture damage-hence it is recommended that the patient's position be changed frequently, he be encouraged to avoid in the bedside commode.  See below  for wound care  recommendations.  Obesity  Procedures/Studies: 2/1>>Echo - Procedure narrative: Technically difficult study. - Left ventricle: The cavity size was normal. Wall thickness was   increased in a pattern of mild LVH. Systolic function was   moderately to severely reduced. The estimated ejection fraction   was in the range of 30% to 35%. Diffuse hypokinesis. Doppler   parameters are consistent with abnormal left ventricular   relaxation (grade 1 diastolic dysfunction). The E/e&' ratio is   between 8-15, suggesting indeterminate LV filling pressure. - Aorta: Aortic root dimension: 39 mm (ED). - Aortic root: The aortic root is mildly dilated. - Tricuspid valve: There was trivial regurgitation. - Pulmonary arteries: PA peak pressure: 35 mm Hg (S). - Inferior vena cava: The vessel was normal in size. The   respirophasic diameter changes were in the normal range (>= 50%),   consistent with normal central venous pressure  Discharge Diagnoses:  Principal Problem:   Sepsis ([place]) Active Problems:   Ischemic cardiomyopathy   Iron deficiency anemia   CKD (chronic kidney disease), stage III ([place])   Essential hypertension   Type II diabetes mellitus with renal manifestations, uncontrolled ([place])   Acute encephalopathy   Discharge Instructions:  Activity:  As tolerated with Full fall precautions use walker/cane & assistance as needed  Discharge Instructions    Diet - low sodium heart healthy   Complete by:  As directed    Recommendation Regular;Thin liquid  Liquid Administration via: Cup;Straw Medication Administration: Whole meds with liquid Supervision: Patient able to self feed Compensations: Slow rate;Small sips/bites Postural Changes: Seated upright at 90 degrees;Remain upright for at least 30 minutes after po intake   Diet Carb Modified   Complete by:  As directed    Discharge instructions   Complete by:  As directed    Follow with Primary MD  Lajean Manes, MD  and Cardiology  at [facility] heart care in 1-2 weeks  Please get a complete blood count and chemistry panel checked by your Primary MD at your next visit, and again as instructed by your Primary MD.  Get Medicines reviewed and adjusted: Please take all your medications with you for your next visit with your Primary MD  Laboratory/radiological data: Please request your Primary MD to go over all hospital tests and procedure/radiological results at the follow up, please ask your Primary MD to get all Hospital records sent to his/her office.  In some cases, they will be blood work, cultures and biopsy results pending at the time of your discharge. Please request that your primary care M.D. follows up on these results.  Also Note the following: If you experience worsening of your admission symptoms, develop shortness of breath, life threatening emergency, suicidal or homicidal thoughts you must seek medical attention immediately by calling 911 or calling your MD immediately  if symptoms less severe.  You must read complete instructions/literature along with all the possible adverse reactions/side effects for all the Medicines you take and that have been prescribed to you. Take any new Medicines after you have completely understood and accpet all the possible adverse reactions/side effects.   Do not drive when taking Pain medications or sleeping medications (Benzodaizepines)  Do not take more than prescribed Pain, Sleep and Anxiety Medications. It is not advisable to combine anxiety,sleep and pain medications without talking with your primary care practitioner  Special Instructions: If you have smoked or chewed Tobacco  in the last 2 yrs please stop smoking, stop any regular Alcohol  and or any Recreational drug use.  Wear Seat belts while driving.  Please note: You were cared for by a hospitalist during your hospital stay. Once you are discharged, your primary care physician will handle any further medical issues.  Please note that NO REFILLS for any discharge medications will be authorized once you are discharged, as it is imperative that you return to your primary care physician (or establish a relationship with a primary care physician if you do not have one) for your post hospital discharge needs so that they can reassess your need for medications and monitor your lab values.   Discharge wound care:   Complete by:  As directed    Dressing procedure/placement/frequency: Patient will be turned from side to side and time in the supine position minimized.  HOB at or below a 30 degree angle.  Bilateral pressure redistribution heel boots are provided as well as guidance for Nursing for topical care using xeroform gauze dressing twice daily. I have provided our house antimicrobial textile (InterDry Ag+) for use in the subpannicular skin fold to wick away excess moisture.  The prescriptive Gerhart's Butt Cream (1:1:1 lotrimin, hydrocortisone and zinc oxide) is provided three times daily to the buttocks, perineal area and medial thighs to resolve the MASD.   Increase activity slowly   Complete by:  As directed      Allergies as of 11/17/2017   No Known Allergies     Medication List    STOP taking these medications   HYDROcodone-acetaminophen 5-325 MG tablet Commonly known as:  NORCO/VICODIN   pseudoephedrine-guaifenesin 60-600 MG 12 hr tablet Commonly known as:  MUCINEX D     TAKE these medications   acetaminophen 500 MG tablet Commonly known as:  TYLENOL Take 1,000 mg by mouth 3 (three) times daily.   albuterol 0.63 MG/3ML nebulizer solution Commonly known as:  ACCUNEB Take 3 mLs (0.63 mg total) by nebulization every 2 (two) hours as needed for wheezing or shortness of breath. What changed:  when to take this   albuterol (2.5 MG/3ML) 0.083% nebulizer solution Commonly known as:  PROVENTIL Take 3 mLs (2.5 mg total) by nebulization 3 (three) times daily. What changed:  You were already taking a  medication with the same name, and this prescription was added. Make sure you understand how and when to take each.   amoxicillin-clavulanate 875-125 MG tablet Commonly known as:  AUGMENTIN Take 1 tablet by mouth 2 (two) times daily. For 6 more doses   atorvastatin 10 MG tablet Commonly known as:  LIPITOR Take 10 mg by mouth at bedtime.   benzonatate 200 MG capsule Commonly known as:  TESSALON Take 1 capsule (200 mg total) by mouth 3 (three) times daily as needed for cough.   Cholecalciferol 50000 units Tabs Give 1 tablet by mouth every Saturday   DECUBI-VITE Caps Take 1 capsule by mouth daily.   MULTIVITAMIN ADULT Tabs Take 1 tablet by mouth daily.   MULTIVITAMIN ADULTS PO Take 1 capsule by mouth daily.   docusate sodium 100 MG capsule Commonly known as:  COLACE Take 1 capsule (100 mg total) by mouth 2 (two) times daily.   eucerin cream Apply to BLE topically two times daily for dry skin   CALAZIME SKIN PROTECTANT EX Apply 1 application topically 2 (two) times daily. Apply to buttock every shift   feeding supplement (PRO-STAT SUGAR FREE 64) Liqd Take 30 mLs 2 (two) times daily by mouth.   ferrous sulfate 325 (65 FE) MG tablet  Take 325 mg by mouth daily with breakfast.   furosemide 20 MG tablet Commonly known as:  LASIX Take 2 tablets (40 mg total) by mouth daily. What changed:  how much to take   gabapentin 300 MG capsule Commonly known as:  NEURONTIN Take 300 mg by mouth 3 (three) times daily.   Gerhardt's butt cream Crea Apply 1 application topically 3 (three) times daily. Cleanse skin on buttocks with tepid tap water and gently pat dry.  Apply in a 1/8 inch layer to buttocks, medial thighs, perineal area.  Turn side to side and minimize time spent in the supine position. Keep HOB at or below a 30 degree angle.   guaiFENesin 600 MG 12 hr tablet Commonly known as:  MUCINEX Take 2 tablets (1,200 mg total) by mouth 2 (two) times daily.   insulin aspart 100  UNIT/ML injection Commonly known as:  novoLOG Inject 11 Units into the skin 3 (three) times daily with meals. Inject 11 units subcutaneously after meals   insulin glargine 100 UNIT/ML injection Commonly known as:  LANTUS Inject 52 Units into the skin at bedtime.   lisinopril 2.5 MG tablet Commonly known as:  ZESTRIL Take 2 tablets (5 mg total) by mouth daily.   metoprolol succinate 25 MG 24 hr tablet Commonly known as:  TOPROL-XL Take 25 mg by mouth daily.   oseltamivir 30 MG capsule Commonly known as:  TAMIFLU Take 1 capsule (30 mg total) by mouth 2 (two) times daily. For 5 more doses and then stop   pantoprazole 40 MG tablet Commonly known as:  PROTONIX Take 40 mg by mouth daily.   polyethylene glycol packet Commonly known as:  MIRALAX / GLYCOLAX Take 17 g by mouth 2 (two) times daily. What changed:    when to take this  reasons to take this   potassium chloride SA 20 MEQ tablet Commonly known as:  K-DUR,KLOR-CON Take 1 tablet (20 mEq total) by mouth daily. Start taking on:  11/18/2017   protein supplement shake Liqd Commonly known as:  PREMIER PROTEIN Take 325 mLs (11 oz total) by mouth 2 (two) times daily between meals.   traMADol 50 MG tablet Commonly known as:  ULTRAM Give 1/2 tablet (25mg ) by mouth two times daily   traZODone 100 MG tablet Commonly known as:  DESYREL Take 1 tablet (100 mg total) by mouth at bedtime.            Discharge Care Instructions  (From admission, onward)        Start     Ordered   11/17/17 0000  Discharge wound care:    Comments:  Dressing procedure/placement/frequency: Patient will be turned from side to side and time in the supine position minimized.  HOB at or below a 30 degree angle.  Bilateral pressure redistribution heel boots are provided as well as guidance for Nursing for topical care using xeroform gauze dressing twice daily. I have provided our house antimicrobial textile (InterDry Ag+) for use in the  subpannicular skin fold to wick away excess moisture.  The prescriptive Gerhart's Butt Cream (1:1:1 lotrimin, hydrocortisone and zinc oxide) is provided three times daily to the buttocks, perineal area and medial thighs to resolve the MASD.   11/17/17 0934     Follow-up Information    Stoneking, Hal, MD. Schedule an appointment as soon as possible for a visit in 1 week(s).   Specialty:  Internal Medicine Contact information: [address] [place] [address] [phone]  Deboraha Sprang, MD. Schedule an appointment as soon as possible for a visit in 2 week(s).   Specialty:  Cardiology Contact information: [address] [place] [address] [phone]          No Known Allergies  Consultations:   None  Other Procedures/Studies: Dg Chest 2 View  Result Date: 11/14/2017 CLINICAL DATA:  Cough, fever, and lethargy. EXAM: CHEST  2 VIEW COMPARISON:  04/06/2017 FINDINGS: Shallow inspiration. Cardiac enlargement with mild pulmonary vascular congestion. Perihilar infiltration likely representing edema. No focal consolidation. No blunting of costophrenic angles. No pneumothorax. Degenerative changes in the spine. IMPRESSION: Shallow inspiration. Cardiac enlargement with mild vascular congestion and perihilar edema. Electronically Signed   By: Lucienne Capers M.D.   On: 11/14/2017 21:47   Ct Head Wo Contrast  Result Date: 11/15/2017 CLINICAL DATA:  Per Dr. Hal Hope notes: HPI: Preston Moore is a 84 y.o. male with history of diabetes mellitus type 2, chronic kidney disease, hypertension and atrial flutter was brought to the ER after patient was found to be increasingly hypoxic and febrile. EXAM: CT HEAD WITHOUT CONTRAST TECHNIQUE: Contiguous axial images were obtained from the base of the skull through the vertex without intravenous contrast. COMPARISON:  02/19/2012 FINDINGS: Brain: There is central and cortical atrophy. Periventricular white  matter changes are consistent with small vessel disease. There is no intra or extra-axial fluid collection or mass lesion. The basilar cisterns and ventricles have a normal appearance. There is no CT evidence for acute infarction or hemorrhage. Vascular: There is atherosclerotic calcifications of the carotic siphons. Skull: Normal. Negative for fracture or focal lesion. Sinuses/Orbits: There are air-fluid levels within the sphenoid air cells and moderate mucoperiosteal thickening within the paranasal sinuses. The mastoid air cells are normally aerated. Other: None IMPRESSION: 1. Atrophy and small vessel disease. 2.  No evidence for acute intracranial abnormality. 3. Changes compatible with acute and chronic sinusitis. Electronically Signed   By: Nolon Nations M.D.   On: 11/15/2017 09:05      TODAY-DAY OF DISCHARGE:  Subjective:   [place] today has no headache,no chest abdominal pain,no new weakness tingling or numbness, feels much better wants to go home today.  Objective:   Blood pressure 119/61, pulse 76, temperature 98.3 F (36.8 C), temperature source Oral, resp. rate 16, weight 108.6 kg (239 lb 6.7 oz), SpO2 92 %.  Intake/Output Summary (Last 24 hours) at 11/17/2017 0935 Last data filed at 11/17/2017 0411 Gross per 24 hour  Intake 580 ml  Output 1700 ml  Net -1120 ml   Filed Weights   11/16/17 0500 11/17/17 0410  Weight: 105.9 kg (233 lb 7.5 oz) 108.6 kg (239 lb 6.7 oz)    Exam: Awake Alert, Oriented *3, No new F.N deficits, Normal affect [place].AT,PERRAL Supple Neck,No JVD, No cervical lymphadenopathy appriciated.  Symmetrical Chest wall movement, Good air movement bilaterally, CTAB RRR,No Gallops,Rubs or new Murmurs, No Parasternal Heave +ve B.Sounds, Abd Soft, Non tender, No organomegaly appriciated, No rebound -guarding or rigidity. No Cyanosis, Clubbing or edema, No new Rash or bruise   PERTINENT RADIOLOGIC STUDIES: Dg Chest 2 View  Result Date: 11/14/2017 CLINICAL  DATA:  Cough, fever, and lethargy. EXAM: CHEST  2 VIEW COMPARISON:  04/06/2017 FINDINGS: Shallow inspiration. Cardiac enlargement with mild pulmonary vascular congestion. Perihilar infiltration likely representing edema. No focal consolidation. No blunting of costophrenic angles. No pneumothorax. Degenerative changes in the spine. IMPRESSION: Shallow inspiration. Cardiac enlargement with mild vascular congestion and perihilar edema. Electronically Signed  By: Lucienne Capers M.D.   On: 11/14/2017 21:47   Ct Head Wo Contrast  Result Date: 11/15/2017 CLINICAL DATA:  Per Dr. Hal Hope notes: HPI: BALDO HUFNAGLE is a 84 y.o. male with history of diabetes mellitus type 2, chronic kidney disease, hypertension and atrial flutter was brought to the ER after patient was found to be increasingly hypoxic and febrile. EXAM: CT HEAD WITHOUT CONTRAST TECHNIQUE: Contiguous axial images were obtained from the base of the skull through the vertex without intravenous contrast. COMPARISON:  02/19/2012 FINDINGS: Brain: There is central and cortical atrophy. Periventricular white matter changes are consistent with small vessel disease. There is no intra or extra-axial fluid collection or mass lesion. The basilar cisterns and ventricles have a normal appearance. There is no CT evidence for acute infarction or hemorrhage. Vascular: There is atherosclerotic calcifications of the carotic siphons. Skull: Normal. Negative for fracture or focal lesion. Sinuses/Orbits: There are air-fluid levels within the sphenoid air cells and moderate mucoperiosteal thickening within the paranasal sinuses. The mastoid air cells are normally aerated. Other: None IMPRESSION: 1. Atrophy and small vessel disease. 2.  No evidence for acute intracranial abnormality. 3. Changes compatible with acute and chronic sinusitis. Electronically Signed   By: Nolon Nations M.D.   On: 11/15/2017 09:05     PERTINENT LAB RESULTS: CBC: Recent Labs     11/15/17 0414 11/16/17 0550  WBC 16.6* 10.4  HGB 11.4* 11.3*  HCT 34.5* 34.4*  PLT 357 366   CMET CMP     Component Value Date/Time   NA 137 11/17/2017 0710   NA 139 09/27/2017   K 3.5 11/17/2017 0710   CL 103 11/17/2017 0710   CO2 26 11/17/2017 0710   GLUCOSE 189 (H) 11/17/2017 0710   BUN 16 11/17/2017 0710   BUN 15 09/27/2017   CREATININE 1.15 11/17/2017 0710   CALCIUM 8.5 (L) 11/17/2017 0710   PROT 6.7 11/16/2017 0550   ALBUMIN 2.4 (L) 11/16/2017 0550   AST 53 (H) 11/16/2017 0550   ALT 45 11/16/2017 0550   ALKPHOS 71 11/16/2017 0550   BILITOT 0.5 11/16/2017 0550   GFRNONAA 58 (L) 11/17/2017 0710   GFRAA >60 11/17/2017 0710    GFR Estimated Creatinine Clearance: 64.1 mL/min (by C-G formula based on SCr of 1.15 mg/dL). No results for input(s): LIPASE, AMYLASE in the last 72 hours. Recent Labs    11/15/17 0926  TROPONINI 0.03*   Invalid input(s): POCBNP No results for input(s): DDIMER in the last 72 hours. No results for input(s): HGBA1C in the last 72 hours. No results for input(s): CHOL, HDL, LDLCALC, TRIG, CHOLHDL, LDLDIRECT in the last 72 hours. No results for input(s): TSH, T4TOTAL, T3FREE, THYROIDAB in the last 72 hours.  Invalid input(s): FREET3 No results for input(s): VITAMINB12, FOLATE, FERRITIN, TIBC, IRON, RETICCTPCT in the last 72 hours. Coags: Recent Labs    11/15/17 0414  INR 1.15   Microbiology: Recent Results (from the past 240 hour(s))  Blood culture (routine x 2)     Status: None (Preliminary result)   Collection Time: 11/14/17  9:29 PM  Result Value Ref Range Status   Specimen Description   Final    BLOOD RIGHT ANTECUBITAL Performed at [facility] [address], [place], [place] [address]    Special Requests   Final    BOTTLES DRAWN AEROBIC AND ANAEROBIC Blood Culture adequate volume Performed at [facility] [address] [place], [place] [address]    Culture  Final    NO GROWTH 2  DAYS Performed at [facility], [place] [address], [place], [place] [address]    Report Status PENDING  Incomplete  Blood culture (routine x 2)     Status: None (Preliminary result)   Collection Time: 11/14/17  9:48 PM  Result Value Ref Range Status   Specimen Description   Final    BLOOD LEFT HAND Performed at [facility] [address], [place], [place] [address]    Special Requests   Final    BOTTLES DRAWN AEROBIC AND ANAEROBIC Blood Culture adequate volume Performed at [facility] [address] [place], [place] [address]    Culture   Final    NO GROWTH 2 DAYS Performed at [facility] [address] [place], [place] [address]    Report Status PENDING  Incomplete  Urine culture     Status: None   Collection Time: 11/15/17  4:23 AM  Result Value Ref Range Status   Specimen Description   Final    URINE, CLEAN CATCH Performed at [facility], [place] [address], [place], [place] [address]    Special Requests   Final    NONE Performed at [facility], [place] [address], [place], [place] [address]    Culture   Final    NO GROWTH Performed at [facility], [place] [address], [place],  [address]    Report Status 11/16/2017 FINAL  Final  MRSA PCR Screening     Status: None   Collection Time: 11/15/17  4:23 AM  Result Value Ref Range Status   MRSA by PCR NEGATIVE NEGATIVE Final    Comment:        The GeneXpert MRSA Assay (FDA approved for NASAL specimens only), is one component of a comprehensive MRSA colonization surveillance program. It is not intended to diagnose MRSA infection nor to guide or monitor treatment for MRSA infections.     FURTHER DISCHARGE INSTRUCTIONS:  Get Medicines reviewed and adjusted: Please take all your medications with you for your next visit with your Primary MD  Laboratory/radiological data: Please request your Primary MD to go over all  hospital tests and procedure/radiological results at the follow up, please ask your Primary MD to get all Hospital records sent to his/her office.  In some cases, they will be blood work, cultures and biopsy results pending at the time of your discharge. Please request that your primary care M.D. goes through all the records of your hospital data and follows up on these results.  Also Note the following: If you experience worsening of your admission symptoms, develop shortness of breath, life threatening emergency, suicidal or homicidal thoughts you must seek medical attention immediately by calling 911 or calling your MD immediately  if symptoms less severe.  You must read complete instructions/literature along with all the possible adverse reactions/side effects for all the Medicines you take and that have been prescribed to you. Take any new Medicines after you have completely understood and accpet all the possible adverse reactions/side effects.   Do not drive when taking Pain medications or sleeping medications (Benzodaizepines)  Do not take more than prescribed Pain, Sleep and Anxiety Medications. It is not advisable to combine anxiety,sleep and pain medications without talking with your primary care practitioner  Special Instructions: If you have smoked or chewed Tobacco  in the last 2 yrs please stop smoking, stop any regular Alcohol  and  or any Recreational drug use.  Wear Seat belts while driving.  Please note: You were cared for by a hospitalist during your hospital stay. Once you are discharged, your primary care physician will handle any further medical issues. Please note that NO REFILLS for any discharge medications will be authorized once you are discharged, as it is imperative that you return to your primary care physician (or establish a relationship with a primary care physician if you do not have one) for your post hospital discharge needs so that they can reassess your need for  medications and monitor your lab values.  Total Time spent coordinating discharge including counseling, education and face to face time equals 45 minutes.  SignedOren Binet 11/17/2017 9:35 AM

## 2017-11-17 NOTE — Progress Notes (Signed)
[facility]on Moore to be D/C'd Skilled nursing facility per MD order.  Discussed prescriptions and follow up appointments with the patient. Prescriptions given to patient, medication list explained in detail. Pt verbalized understanding.  Allergies as of 11/17/2017   No Known Allergies     Medication List    STOP taking these medications   HYDROcodone-acetaminophen 5-325 MG tablet Commonly known as:  NORCO/VICODIN   pseudoephedrine-guaifenesin 60-600 MG 12 hr tablet Commonly known as:  MUCINEX D     TAKE these medications   acetaminophen 500 MG tablet Commonly known as:  TYLENOL Take 1,000 mg by mouth 3 (three) times daily.   albuterol 0.63 MG/3ML nebulizer solution Commonly known as:  ACCUNEB Take 3 mLs (0.63 mg total) by nebulization every 2 (two) hours as needed for wheezing or shortness of breath. What changed:  when to take this   albuterol (2.5 MG/3ML) 0.083% nebulizer solution Commonly known as:  PROVENTIL Take 3 mLs (2.5 mg total) by nebulization 3 (three) times daily. What changed:  You were already taking a medication with the same name, and this prescription was added. Make sure you understand how and when to take each.   amoxicillin-clavulanate 875-125 MG tablet Commonly known as:  AUGMENTIN Take 1 tablet by mouth 2 (two) times daily. For 6 more doses   atorvastatin 10 MG tablet Commonly known as:  LIPITOR Take 10 mg by mouth at bedtime.   benzonatate 200 MG capsule Commonly known as:  TESSALON Take 1 capsule (200 mg total) by mouth 3 (three) times daily as needed for cough.   Cholecalciferol 50000 units Tabs Give 1 tablet by mouth every Saturday   DECUBI-VITE Caps Take 1 capsule by mouth daily.   MULTIVITAMIN ADULT Tabs Take 1 tablet by mouth daily.   MULTIVITAMIN ADULTS PO Take 1 capsule by mouth daily.   docusate sodium 100 MG capsule Commonly known as:  COLACE Take 1 capsule (100 mg total) by mouth 2 (two) times daily.   eucerin cream Apply to BLE  topically two times daily for dry skin   CALAZIME SKIN PROTECTANT EX Apply 1 application topically 2 (two) times daily. Apply to buttock every shift   feeding supplement (PRO-STAT SUGAR FREE 64) Liqd Take 30 mLs 2 (two) times daily by mouth.   ferrous sulfate 325 (65 FE) MG tablet Take 325 mg by mouth daily with breakfast.   furosemide 20 MG tablet Commonly known as:  LASIX Take 2 tablets (40 mg total) by mouth daily. What changed:  how much to take   gabapentin 300 MG capsule Commonly known as:  NEURONTIN Take 300 mg by mouth 3 (three) times daily.   Gerhardt's butt cream Crea Apply 1 application topically 3 (three) times daily. Cleanse skin on buttocks with tepid tap water and gently pat dry.  Apply in a 1/8 inch layer to buttocks, medial thighs, perineal area.  Turn side to side and minimize time spent in the supine position. Keep HOB at or below a 30 degree angle.   guaiFENesin 600 MG 12 hr tablet Commonly known as:  MUCINEX Take 2 tablets (1,200 mg total) by mouth 2 (two) times daily.   insulin aspart 100 UNIT/ML injection Commonly known as:  novoLOG Inject 11 Units into the skin 3 (three) times daily with meals. Inject 11 units subcutaneously after meals   insulin glargine 100 UNIT/ML injection Commonly known as:  LANTUS Inject 52 Units into the skin at bedtime.   lisinopril 2.5 MG tablet Commonly known as:  ZESTRIL Take 2 tablets (5 mg total) by mouth daily.   metoprolol succinate 25 MG 24 hr tablet Commonly known as:  TOPROL-XL Take 25 mg by mouth daily.   oseltamivir 30 MG capsule Commonly known as:  TAMIFLU Take 1 capsule (30 mg total) by mouth 2 (two) times daily. For 5 more doses and then stop   pantoprazole 40 MG tablet Commonly known as:  PROTONIX Take 40 mg by mouth daily.   polyethylene glycol packet Commonly known as:  MIRALAX / GLYCOLAX Take 17 g by mouth 2 (two) times daily. What changed:    when to take this  reasons to take this    potassium chloride SA 20 MEQ tablet Commonly known as:  K-DUR,KLOR-CON Take 1 tablet (20 mEq total) by mouth daily. Start taking on:  11/18/2017   protein supplement shake Liqd Commonly known as:  PREMIER PROTEIN Take 325 mLs (11 oz total) by mouth 2 (two) times daily between meals.   traMADol 50 MG tablet Commonly known as:  ULTRAM Give 1/2 tablet (25mg ) by mouth two times daily   traZODone 100 MG tablet Commonly known as:  DESYREL Take 1 tablet (100 mg total) by mouth at bedtime.            Discharge Care Instructions  (From admission, onward)        Start     Ordered   11/17/17 0000  Discharge wound care:    Comments:  Dressing procedure/placement/frequency: Patient will be turned from side to side and time in the supine position minimized.  HOB at or below a 30 degree angle.  Bilateral pressure redistribution heel boots are provided as well as guidance for Nursing for topical care using xeroform gauze dressing twice daily. I have provided our house antimicrobial textile (InterDry Ag+) for use in the subpannicular skin fold to wick away excess moisture.  The prescriptive Gerhart's Butt Cream (1:1:1 lotrimin, hydrocortisone and zinc oxide) is provided three times daily to the buttocks, perineal area and medial thighs to resolve the MASD.   11/17/17 0934      Vitals:   11/17/17 1327 11/17/17 1335  BP:  119/68  Pulse:  80  Resp:  16  Temp:  98.4 F (36.9 C)  SpO2: 94% 97%    Skin clean, dry and intact without evidence of skin break down, no evidence of skin tears noted. IV catheter discontinued intact. Site without signs and symptoms of complications. Dressing and pressure applied. Pt denies pain at this time. No complaints noted.  An After Visit Summary was printed and given to the patient. Patient escorted via [place], and D/C home via [place], RN [employer] Phone [phone]

## 2017-11-19 ENCOUNTER — Non-Acute Institutional Stay (SKILLED_NURSING_FACILITY): Payer: Medicare Other | Admitting: Internal Medicine

## 2017-11-19 DIAGNOSIS — J9801 Acute bronchospasm: Secondary | ICD-10-CM

## 2017-11-19 DIAGNOSIS — E785 Hyperlipidemia, unspecified: Secondary | ICD-10-CM

## 2017-11-19 DIAGNOSIS — I5022 Chronic systolic (congestive) heart failure: Secondary | ICD-10-CM

## 2017-11-19 DIAGNOSIS — E1140 Type 2 diabetes mellitus with diabetic neuropathy, unspecified: Secondary | ICD-10-CM

## 2017-11-19 DIAGNOSIS — I10 Essential (primary) hypertension: Secondary | ICD-10-CM

## 2017-11-19 DIAGNOSIS — E1169 Type 2 diabetes mellitus with other specified complication: Secondary | ICD-10-CM | POA: Diagnosis not present

## 2017-11-19 DIAGNOSIS — Z794 Long term (current) use of insulin: Secondary | ICD-10-CM

## 2017-11-19 LAB — CULTURE, BLOOD (ROUTINE X 2)
CULTURE: NO GROWTH
Culture: NO GROWTH
Special Requests: ADEQUATE
Special Requests: ADEQUATE

## 2017-11-24 NOTE — Progress Notes (Signed)
This encounter was created in error - please disregard.

## 2017-11-27 ENCOUNTER — Non-Acute Institutional Stay (SKILLED_NURSING_FACILITY): Payer: Medicare Other | Admitting: Adult Health

## 2017-11-27 DIAGNOSIS — E1140 Type 2 diabetes mellitus with diabetic neuropathy, unspecified: Secondary | ICD-10-CM

## 2017-11-27 DIAGNOSIS — E1165 Type 2 diabetes mellitus with hyperglycemia: Secondary | ICD-10-CM | POA: Diagnosis not present

## 2017-11-27 DIAGNOSIS — Z794 Long term (current) use of insulin: Secondary | ICD-10-CM | POA: Diagnosis not present

## 2017-11-27 DIAGNOSIS — E1129 Type 2 diabetes mellitus with other diabetic kidney complication: Secondary | ICD-10-CM | POA: Diagnosis not present

## 2017-11-27 DIAGNOSIS — I255 Ischemic cardiomyopathy: Secondary | ICD-10-CM

## 2017-11-27 DIAGNOSIS — IMO0002 Reserved for concepts with insufficient information to code with codable children: Secondary | ICD-10-CM

## 2017-11-28 ENCOUNTER — Encounter: Payer: Self-pay | Admitting: Adult Health

## 2017-11-28 NOTE — Progress Notes (Signed)
Location:   [facility] Room Number: 952 WUXLK of Service:  SNF (31)   CODE STATUS: dnr (MOST reviewed 09/27/17)  No Known Allergies  Chief Complaint  Patient presents with  . Acute Visit    care plan meeting     HPI:  We have come together with his daughters and care plan team for his care plan meeting. We discussed numerous issues; the nursing issues were addressed; we did his diet; his skin issues and his po intake. his weight has remained stable over the past several months in th 240's pound range. We did discuss his medications. We did discuss his goals of care. His family has verbalized understanding.    Past Medical History:  Diagnosis Date  . Atrial flutter ([place])   . B12 deficiency 02/22/2012  . Closed right hip fracture, initial encounter ([place]) 04/06/2017  . Diabetes mellitus   . Hyperlipidemia   . Hypertension   . Ileus ([place]) 02/29/2012  . Malnutrition ([place]) 02/29/2012  . Neuropathy   . Non compliance with medical treatment 02/29/2012  . Pancreatitis 02/22/2012  . SBO (small bowel obstruction) ([place]) 02/23/2012  . Syncope and collapse     Past Surgical History:  Procedure Laterality Date  . BACK SURGERY    . COMPRESSION HIP SCREW Right 04/07/2017   Procedure: RIGHT HIP INTRATROCHANTERIC NAILING;  Surgeon: [name], MD;  Location: [facility];  Service: Orthopedics;  Laterality: Right;  . LAMINECTOMY  1965    Social History   Socioeconomic History  . Marital status: Widowed    Spouse name: Not on file  . Number of children: Y  . Years of education: Not on file  . Highest education level: Not on file  Social Needs  . Financial resource strain: Not on file  . Food insecurity - worry: Not on file  . Food insecurity - inability: Not on file  . Transportation needs - medical: Not on file  . Transportation needs - non-medical: Not on file  Occupational History  . Occupation: retired Research scientist (medical): RETIRED  Tobacco Use  .  Smoking status: Current Every Day Smoker    Packs/day: 0.50    Years: 10.00    Pack years: 5.00    Types: Cigarettes  . Smokeless tobacco: Never Used  Substance and Sexual Activity  . Alcohol use: Yes    Alcohol/week: 21.6 oz    Types: 36 Cans of beer per week  . Drug use: No  . Sexual activity: No  Other Topics Concern  . Not on file  Social History Narrative  . Not on file   Family History  Problem Relation Age of Onset  . Hypertension Neg Hx   . Coronary artery disease Neg Hx   . Diabetes Neg Hx       VITAL SIGNS BP 127/70   Pulse 100   Temp 98.7 F (37.1 C)   Resp 18   Ht 6' (1.829 m)   Wt 247 lb (112 kg)   SpO2 94%   BMI 33.50 kg/m   Outpatient Encounter Medications as of 11/27/2017  Medication Sig  . acetaminophen (TYLENOL) 500 MG tablet Take 1,000 mg by mouth 3 (three) times daily.  Marland Kitchen albuterol (ACCUNEB) 0.63 MG/3ML nebulizer solution Take 3 mLs (0.63 mg total) by nebulization every 2 (two) hours as needed for wheezing or shortness of breath.  Marland Kitchen albuterol (PROVENTIL) (2.5 MG/3ML) 0.083% nebulizer solution Take 3 mLs (2.5 mg total) by nebulization  3 (three) times daily.  . Amino Acids-Protein Hydrolys (FEEDING SUPPLEMENT, PRO-STAT SUGAR FREE 64,) LIQD Take 30 mLs 2 (two) times daily by mouth.  Marland Kitchen amoxicillin-clavulanate (AUGMENTIN) 875-125 MG tablet Take 1 tablet by mouth 2 (two) times daily. For 6 more doses  . atorvastatin (LIPITOR) 10 MG tablet Take 10 mg by mouth at bedtime.  . benzonatate (TESSALON) 200 MG capsule Take 1 capsule (200 mg total) by mouth 3 (three) times daily as needed for cough.  . Cholecalciferol 50000 units TABS Give 1 tablet by mouth every Saturday  . docusate sodium (COLACE) 100 MG capsule Take 1 capsule (100 mg total) by mouth 2 (two) times daily.  . ferrous sulfate 325 (65 FE) MG tablet Take 325 mg by mouth daily with breakfast.   . furosemide (LASIX) 20 MG tablet Take 2 tablets (40 mg total) by mouth daily.  Marland Kitchen  HYDROcodone-acetaminophen (NORCO/VICODIN) 5-325 MG tablet Take 1 tablet by mouth every 6 (six) hours as needed for moderate pain.  Marland Kitchen insulin aspart (NOVOLOG) 100 UNIT/ML injection Inject 11 Units into the skin 3 (three) times daily with meals. Inject 11 units subcutaneously after meals  . insulin glargine (LANTUS) 100 UNIT/ML injection Inject 52 Units into the skin at bedtime.   Marland Kitchen lisinopril (ZESTRIL) 2.5 MG tablet Take 2 tablets (5 mg total) by mouth daily.  . metoprolol succinate (TOPROL-XL) 25 MG 24 hr tablet Take 25 mg by mouth daily.  . Multiple Vitamins-Minerals (DECUBI-VITE) CAPS Take 1 capsule by mouth daily.  . pantoprazole (PROTONIX) 40 MG tablet Take 40 mg by mouth daily.  . polyethylene glycol (MIRALAX / GLYCOLAX) packet Take 17 g by mouth 2 (two) times daily.   . potassium chloride SA (K-DUR,KLOR-CON) 20 MEQ tablet Take 1 tablet (20 mEq total) by mouth daily.  . protein supplement shake (PREMIER PROTEIN) LIQD Take 325 mLs (11 oz total) by mouth 2 (two) times daily between meals.  . Skin Protectants, Misc. (CALAZIME SKIN PROTECTANT EX) Apply 1 application topically 2 (two) times daily. Apply to buttock every shift   . Skin Protectants, Misc. (EUCERIN) cream Apply to BLE topically two times daily for dry skin  . traMADol (ULTRAM) 50 MG tablet Give 1/2 tablet (25mg ) by mouth two times daily (Patient taking differently: Take 25 mg by mouth every 12 (twelve) hours as needed. Give 1/2 tablet (25mg ) by mouth two times daily )  . gabapentin (NEURONTIN) 300 MG capsule Take 300 mg by mouth 3 (three) times daily.   . traZODone (DESYREL) 100 MG tablet Take 1 tablet (100 mg total) by mouth at bedtime.  . [DISCONTINUED] guaiFENesin (MUCINEX) 600 MG 12 hr tablet Take 2 tablets (1,200 mg total) by mouth 2 (two) times daily. (Patient not taking: Reported on 11/21/2017)  . [DISCONTINUED] Hydrocortisone (GERHARDT'S BUTT CREAM) CREA Apply 1 application topically 3 (three) times daily. Cleanse skin on buttocks  with tepid tap water and gently pat dry.  Apply in a 1/8 inch layer to buttocks, medial thighs, perineal area.  Turn side to side and minimize time spent in the supine position. Keep HOB at or below a 30 degree angle. (Patient not taking: Reported on 11/21/2017)  . [DISCONTINUED] oseltamivir (TAMIFLU) 30 MG capsule Take 1 capsule (30 mg total) by mouth 2 (two) times daily. For 5 more doses and then stop (Patient not taking: Reported on 11/21/2017)   No facility-administered encounter medications on file as of 11/27/2017.      SIGNIFICANT DIAGNOSTIC EXAMS  NONE RECENT   LABS REVIEWED:  PREVIOUS:    04-10-17: wbc 7.4; hgb 8.7; hct 26.5; mcv 84.7; plt 165; glucose 211; bun 19; creat 1.45; k+ 4.0; na++ 136; ca 7.4; liver normal albumin 2.7  09-27-17: wbc 8.7; hgb 14.6; hct 43.2; mcv 83.5; plt 275; glucose 204; bun 14.5; creat 1.30; k+ 4.0; na++ 139; ca 8.8; liver normal albumin 3.6 hgb a1c 9.9; chol 102; ldl 52; trig 117 hdl 27; vit D 47.12   10-08-17: urine micro-albumin 1.2  NO NEW LABS    Review of Systems  Constitutional: Negative for malaise/fatigue.  Respiratory: Negative for cough and shortness of breath.   Cardiovascular: Negative for chest pain, palpitations and leg swelling.  Gastrointestinal: Negative for abdominal pain, constipation and heartburn.  Musculoskeletal: Negative for back pain, joint pain and myalgias.  Skin: Negative.   Neurological: Negative for dizziness.  Psychiatric/Behavioral: The patient is not nervous/anxious.      Physical Exam  Constitutional: He is oriented to person, place, and time. He appears well-developed and well-nourished. No distress.  Neck: No thyromegaly present.  Cardiovascular: Normal rate, regular rhythm and intact distal pulses.  Murmur heard. 1/6  Pulmonary/Chest: Effort normal and breath sounds normal. No respiratory distress.  Abdominal: Soft. Bowel sounds are normal. He exhibits no distension. There is no tenderness.  Lymphadenopathy:     He has no cervical adenopathy.  Neurological: He is alert and oriented to person, place, and time.  Skin: Skin is warm and dry. He is not diaphoretic.  Unstaged left lateral heel: 0.2 x 0.4 x 0.1 cm Unstaged left medial heel: resolved Unstaged right heel: 3.2 x 8.2 x nm cm Stage II; left buttocks: 0.6 x 0.5 x 0.1 cm   Psychiatric: He has a normal mood and affect.    ASSESSMENT/ PLAN:  TODAY:   1. Ischemic cardiomyopathy 2. 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  Cd stage III  Will continue his current plan of care. Will have him with a mechanical soft diet Will continue his current medication regimen.   Time spent with patient: 40 minutes: discussed his medical status; medications; diet; weight; therapy and goals of care. Verbalized understanding.   MD is aware of resident's narcotic use and is in agreement with current plan of care. We will attempt to wean resident as apropriate   Ok Edwards NP [facility]  Contact [phone] Monday through Friday 8am- 5pm  After hours call [phone]

## 2017-12-12 ENCOUNTER — Non-Acute Institutional Stay (SKILLED_NURSING_FACILITY): Payer: Medicare Other | Admitting: Adult Health

## 2017-12-12 ENCOUNTER — Encounter: Payer: Self-pay | Admitting: Adult Health

## 2017-12-12 DIAGNOSIS — E1129 Type 2 diabetes mellitus with other diabetic kidney complication: Secondary | ICD-10-CM | POA: Diagnosis not present

## 2017-12-12 DIAGNOSIS — E1165 Type 2 diabetes mellitus with hyperglycemia: Secondary | ICD-10-CM

## 2017-12-12 DIAGNOSIS — Z794 Long term (current) use of insulin: Secondary | ICD-10-CM | POA: Diagnosis not present

## 2017-12-12 DIAGNOSIS — IMO0002 Reserved for concepts with insufficient information to code with codable children: Secondary | ICD-10-CM

## 2017-12-12 DIAGNOSIS — E1140 Type 2 diabetes mellitus with diabetic neuropathy, unspecified: Secondary | ICD-10-CM | POA: Diagnosis not present

## 2017-12-12 DIAGNOSIS — L89323 Pressure ulcer of left buttock, stage 3: Secondary | ICD-10-CM

## 2017-12-12 NOTE — Progress Notes (Signed)
Location:   [place] Room Number: 295 JOACZ of Service:  SNF (31)   CODE STATUS: DNR (MOST reviewed 21-13-18)  No Known Allergies  Chief Complaint  Patient presents with  . Acute Visit    wound management     HPI:  I have been asked by family to review his left buttock wounds. They have gotten worse over the past 2 weeks. He is staying in bed due to his ulcerations. He is having pain in his left buttock wounds. There are no signs of infection present; however the surrounding areas are inflamed. He denies any changes in appetite; there are no reports of fever present.  His cbgs remain elevated; there are no reports of missed doses. There are no reports of excessive thirst of hunger   Past Medical History:  Diagnosis Date  . Atrial flutter ([place])   . B12 deficiency 02/22/2012  . Closed right hip fracture, initial encounter ([place]) 04/06/2017  . Diabetes mellitus   . Hyperlipidemia   . Hypertension   . Ileus ([place]) 02/29/2012  . Malnutrition ([place]) 02/29/2012  . Neuropathy   . Non compliance with medical treatment 02/29/2012  . Pancreatitis 02/22/2012  . SBO (small bowel obstruction) ([place]) 02/23/2012  . Syncope and collapse     Past Surgical History:  Procedure Laterality Date  . BACK SURGERY    . COMPRESSION HIP SCREW Right 04/07/2017   Procedure: RIGHT HIP INTRATROCHANTERIC NAILING;  Surgeon: [name], MD;  Location: [facility];  Service: Orthopedics;  Laterality: Right;  . LAMINECTOMY  1965    Social History   Socioeconomic History  . Marital status: Widowed    Spouse name: Not on file  . Number of children: Y  . Years of education: Not on file  . Highest education level: Not on file  Social Needs  . Financial resource strain: Not on file  . Food insecurity - worry: Not on file  . Food insecurity - inability: Not on file  . Transportation needs - medical: Not on file  . Transportation needs - non-medical: Not on file  Occupational History  .  Occupation: retired Research scientist (medical): RETIRED  Tobacco Use  . Smoking status: Current Every Day Smoker    Packs/day: 0.50    Years: 10.00    Pack years: 5.00    Types: Cigarettes  . Smokeless tobacco: Never Used  Substance and Sexual Activity  . Alcohol use: Yes    Alcohol/week: 21.6 oz    Types: 36 Cans of beer per week  . Drug use: No  . Sexual activity: No  Other Topics Concern  . Not on file  Social History Narrative  . Not on file   Family History  Problem Relation Age of Onset  . Hypertension Neg Hx   . Coronary artery disease Neg Hx   . Diabetes Neg Hx       VITAL SIGNS BP 122/70   Pulse 80   Temp 98.9 F (37.2 C)   Resp 18   Ht 6' (1.829 m)   Wt 218 lb 11.2 oz (99.2 kg)   SpO2 97%   BMI 29.66 kg/m   Outpatient Encounter Medications as of 12/12/2017  Medication Sig  . acetaminophen (TYLENOL) 500 MG tablet Take 1,000 mg by mouth every 8 (eight) hours as needed.   Marland Kitchen albuterol (ACCUNEB) 0.63 MG/3ML nebulizer solution Take 3 mLs (0.63 mg total) by nebulization every 2 (two) hours as needed for wheezing  or shortness of breath.  Marland Kitchen albuterol (PROVENTIL) (2.5 MG/3ML) 0.083% nebulizer solution Take 3 mLs (2.5 mg total) by nebulization 3 (three) times daily.  . Amino Acids-Protein Hydrolys (FEEDING SUPPLEMENT, PRO-STAT SUGAR FREE 64,) LIQD Take 30 mLs 2 (two) times daily by mouth.  Marland Kitchen atorvastatin (LIPITOR) 10 MG tablet Take 10 mg by mouth at bedtime.  . benzonatate (TESSALON) 200 MG capsule Take 1 capsule (200 mg total) by mouth 3 (three) times daily as needed for cough.  . Cholecalciferol 50000 units TABS Give 1 tablet by mouth every Saturday  . docusate sodium (COLACE) 100 MG capsule Take 1 capsule (100 mg total) by mouth 2 (two) times daily.  . ferrous sulfate 325 (65 FE) MG tablet Take 325 mg by mouth daily with breakfast.   . furosemide (LASIX) 20 MG tablet Take 2 tablets (40 mg total) by mouth daily.  Marland Kitchen gabapentin (NEURONTIN) 300 MG capsule Take 300 mg  by mouth 3 (three) times daily.   . insulin aspart (NOVOLOG) 100 UNIT/ML injection Inject 11 Units into the skin 3 (three) times daily with meals. Inject 11 units subcutaneously after meals  . insulin glargine (LANTUS) 100 UNIT/ML injection Inject 52 Units into the skin at bedtime.   Marland Kitchen lisinopril (ZESTRIL) 2.5 MG tablet Take 2 tablets (5 mg total) by mouth daily.  . metoprolol succinate (TOPROL-XL) 25 MG 24 hr tablet Take 25 mg by mouth daily.  . Multiple Vitamins-Minerals (DECUBI-VITE) CAPS Take 1 capsule by mouth daily.  . pantoprazole (PROTONIX) 40 MG tablet Take 40 mg by mouth daily.  . polyethylene glycol (MIRALAX / GLYCOLAX) packet Take 17 g by mouth 2 (two) times daily.   . potassium chloride SA (K-DUR,KLOR-CON) 20 MEQ tablet Take 1 tablet (20 mEq total) by mouth daily.  . Skin Protectants, Misc. (CALAZIME SKIN PROTECTANT EX) Apply 1 application topically 2 (two) times daily. Apply to buttock every shift   . Skin Protectants, Misc. (EUCERIN) cream Apply to BLE topically two times daily for dry skin  . traMADol (ULTRAM) 50 MG tablet Take 25 mg by mouth every 12 (twelve) hours as needed.  . traZODone (DESYREL) 100 MG tablet Take 100 mg by mouth at bedtime as needed for sleep.  . [DISCONTINUED] traZODone (DESYREL) 100 MG tablet Take 1 tablet (100 mg total) by mouth at bedtime. (Patient taking differently: Take 100 mg by mouth at bedtime as needed. )  . protein supplement shake (PREMIER PROTEIN) LIQD Take 325 mLs (11 oz total) by mouth 2 (two) times daily between meals.  . [DISCONTINUED] amoxicillin-clavulanate (AUGMENTIN) 875-125 MG tablet Take 1 tablet by mouth 2 (two) times daily. For 6 more doses  . [DISCONTINUED] HYDROcodone-acetaminophen (NORCO/VICODIN) 5-325 MG tablet Take 1 tablet by mouth every 6 (six) hours as needed for moderate pain.  . [DISCONTINUED] traMADol (ULTRAM) 50 MG tablet Give 1/2 tablet (25mg ) by mouth two times daily (Patient taking differently: Take 25 mg by mouth every  12 (twelve) hours as needed. Give 1/2 tablet (25mg ) by mouth two times daily )   No facility-administered encounter medications on file as of 12/12/2017.      SIGNIFICANT DIAGNOSTIC EXAMS  NONE RECENT   LABS REVIEWED: PREVIOUS:    04-10-17: wbc 7.4; hgb 8.7; hct 26.5; mcv 84.7; plt 165; glucose 211; bun 19; creat 1.45; k+ 4.0; na++ 136; ca 7.4; liver normal albumin 2.7  09-27-17: wbc 8.7; hgb 14.6; hct 43.2; mcv 83.5; plt 275; glucose 204; bun 14.5; creat 1.30; k+ 4.0; na++ 139; ca 8.8; liver  normal albumin 3.6 hgb a1c 9.9; chol 102; ldl 52; trig 117 hdl 27; vit D 47.12   10-08-17: urine micro-albumin 1.2  NO NEW LABS   Review of Systems  Constitutional: Negative for malaise/fatigue.  Respiratory: Negative for cough and shortness of breath.   Cardiovascular: Negative for chest pain, palpitations and leg swelling.  Gastrointestinal: Negative for abdominal pain, constipation and heartburn.  Musculoskeletal: Positive for myalgias. Negative for back pain and joint pain.       Has pain in left buttock   Skin: Negative.   Neurological: Negative for dizziness.  Psychiatric/Behavioral: The patient is not nervous/anxious.    Physical Exam  Constitutional: He appears well-developed and well-nourished. No distress.  Neck: No thyromegaly present.  Cardiovascular: Normal rate, regular rhythm and intact distal pulses.  Murmur heard. 1/6  Pulmonary/Chest: Effort normal and breath sounds normal. No respiratory distress.  Abdominal: Soft. Bowel sounds are normal. He exhibits no distension. There is no tenderness.  Musculoskeletal: He exhibits no edema.  Is able to move all extremities   Lymphadenopathy:    He has no cervical adenopathy.  Neurological: He is alert.  Skin: Skin is warm and dry. He is not diaphoretic.  Unstaged right heel: 3.0 x 6.9 x NM cm Stage III left buttock with yellow; pale wound bed periwound area is inflamed without signs of infection present   Psychiatric: He has a  normal mood and affect.    ASSESSMENT/ PLAN:  TODAY:   1. Type 2 diabetes mellitus with diabetic neuropathy with long term current sue of insulin: with renal manifestations; uncontrolled: without changes hgb a1c 9.9 will increase lantus to 57 units nightly and will increase novolog to 15 units after meals.   3. Stage III left buttock ulceration: will use snatyl to wound bed daily; will place air mattress on bed and will leave heel mat in place.   MD is aware of resident's narcotic use and is in agreement with current plan of care. We will attempt to wean resident as apropriate   Ok Edwards NP [facility]  Contact [phone] Monday through Friday 8am- 5pm  After hours call [phone]

## 2017-12-24 ENCOUNTER — Non-Acute Institutional Stay (SKILLED_NURSING_FACILITY): Payer: Medicare Other | Admitting: Internal Medicine

## 2017-12-24 ENCOUNTER — Encounter: Payer: Self-pay | Admitting: Internal Medicine

## 2017-12-24 DIAGNOSIS — E1140 Type 2 diabetes mellitus with diabetic neuropathy, unspecified: Secondary | ICD-10-CM

## 2017-12-24 DIAGNOSIS — I255 Ischemic cardiomyopathy: Secondary | ICD-10-CM | POA: Diagnosis not present

## 2017-12-24 DIAGNOSIS — Z794 Long term (current) use of insulin: Secondary | ICD-10-CM | POA: Diagnosis not present

## 2017-12-24 DIAGNOSIS — K567 Ileus, unspecified: Secondary | ICD-10-CM | POA: Diagnosis not present

## 2017-12-24 DIAGNOSIS — J189 Pneumonia, unspecified organism: Secondary | ICD-10-CM

## 2017-12-24 DIAGNOSIS — I10 Essential (primary) hypertension: Secondary | ICD-10-CM | POA: Diagnosis not present

## 2017-12-24 LAB — CBC AND DIFFERENTIAL
HEMATOCRIT: 36 — AB (ref 41–53)
HEMOGLOBIN: 11.7 — AB (ref 13.5–17.5)
NEUTROS ABS: 20
PLATELETS: 296 (ref 150–399)
WBC: 21.7

## 2017-12-24 LAB — BASIC METABOLIC PANEL
BUN: 38 — AB (ref 4–21)
Creatinine: 1.3 (ref 0.6–1.3)
Glucose: 314
Potassium: 4.2 (ref 3.4–5.3)
Sodium: 136 — AB (ref 137–147)

## 2017-12-24 NOTE — Progress Notes (Signed)
Patient ID: Preston Moore, male   DOB: 1933/10/26, 84 y.o.   MRN: [id number]  Location:  [place] Room Number: [place] of Service:  SNF (31) Provider:  Piffard, [place], DO  Patient Care Team: Gildardo Cranker, DO as PCP - General (Internal Medicine) Gerlene Fee, NP as Nurse Practitioner ([place] [place] [facility])  Extended Emergency Contact Information Primary Emergency Contact: [name],[name] Address: [address]          [place], [place] [address] [name] of [place] Phone: [phone] Relation: Daughter Secondary Emergency Contact: [name] Address: [address], [place], [place] [address] [name] of [place] Phone: [phone] Work Phone: [phone] Mobile Phone: [phone] Relation: Daughter  Code Status:  DNR Goals of care: Advanced Directive information Advanced Directives 12/24/2017  Does Patient Have a Medical Advance Directive? Yes  Type of Advance Directive Out of facility DNR (pink MOST or yellow form)  Does patient want to make changes to medical advance directive? No - Patient declined  Copy of [place] in Chart? -  Would patient like information on creating a medical advance directive? No - Patient declined  Pre-existing out of facility DNR order (yellow form or pink MOST form) Yellow form placed in chart (order not valid for inpatient use);Pink MOST form placed in chart (order not valid for inpatient use)     Chief Complaint  Patient presents with  . Medical Management of Chronic Issues    [place]    HPI:  Pt is a 84 y.o. male seen today for medical management of chronic diseases.  He is being treated for HCAP with IV levaquin/ clindamycin/cefepime through 12/26/17 via left arm midline. He gets duonebs, mucinex and tessalon perles. He is hypoxic with O2 sats 88% and is noncompliant with [place] O2. He is on  probiotic. He is also being tx for ileus and is currently NPO x 24hrs with plans to advance diet to clear liquids x 48hrs. He had soft, pasty BM today. He was last tx for pneumonia in Jan 2019 and had to be hospitalized. He is a poor historian due to memory loss. Hx obtained from chart.  Essential hypertension - BP stable on toprol xl 25 mg daily   Ischemic cardiomyopathy - stable on lasix 20 mg daily   GERD without esophagitis - stable on protonix 40 mg daily   Dyslipidemia associated with type 2 diabetes mellitus - stable on lipitor 10 mg nightly; LDL 117  DM - uncontrolled. A1c 9.9%; CBGs improving. He takes lantus 75 units nightly and novlog 15 units after meals; he has neuropathy and takes neurontin 300 mg three times daily  Tylenol 1 gm three times daily; ultram 25 mg twice daily and vicodin 5/325 mg every 6 hours as needed  Chronic constipation - stable on miralax twice daily and colace twice daily   Iron deficiency anemia - stable on iron daily. Hgb 11.7  CKD - stage 3. Stable. Cr 1.3  Bilateral heel blisters and DTI - unchanged; followed by wound care  Insomnia - stable on trazodone 100 mg nightly   Vit D deficiency - stable on weekly Vit D. Vit D 25OH level 47.12   Past Medical History:  Diagnosis Date  . Atrial flutter ([place])   . B12 deficiency 02/22/2012  . Closed right hip fracture, initial encounter ([place]) 04/06/2017  . Diabetes mellitus   .  Hyperlipidemia   . Hypertension   . Ileus ([place]) 02/29/2012  . Malnutrition ([place]) 02/29/2012  . Neuropathy   . Non compliance with medical treatment 02/29/2012  . Pancreatitis 02/22/2012  . SBO (small bowel obstruction) ([place]) 02/23/2012  . Syncope and collapse    Past Surgical History:  Procedure Laterality Date  . BACK SURGERY    . COMPRESSION HIP SCREW Right 04/07/2017   Procedure: RIGHT HIP INTRATROCHANTERIC NAILING;  Surgeon: [name], MD;  Location: [place];  Service: Orthopedics;  Laterality: Right;  . LAMINECTOMY  1965      No Known Allergies  Outpatient Encounter Medications as of 12/24/2017  Medication Sig  . acetaminophen (TYLENOL) 500 MG tablet Take 1,000 mg by mouth every 8 (eight) hours as needed.   Marland Kitchen albuterol (ACCUNEB) 0.63 MG/3ML nebulizer solution Take 3 mLs (0.63 mg total) by nebulization every 2 (two) hours as needed for wheezing or shortness of breath.  Marland Kitchen albuterol (PROVENTIL) (2.5 MG/3ML) 0.083% nebulizer solution Take 3 mLs (2.5 mg total) by nebulization 3 (three) times daily.  . Amino Acids-Protein Hydrolys (FEEDING SUPPLEMENT, PRO-STAT SUGAR FREE 64,) LIQD Take 30 mLs 2 (two) times daily by mouth.  Marland Kitchen atorvastatin (LIPITOR) 10 MG tablet Take 10 mg by mouth at bedtime.  . benzonatate (TESSALON) 200 MG capsule Take 1 capsule (200 mg total) by mouth 3 (three) times daily as needed for cough.  . ceFEPime (MAXIPIME) IVPB Inject 2 g into the vein every 12 (twelve) hours.  . Cholecalciferol 50000 units TABS Give 1 tablet by mouth every Saturday  . clindamycin in dextrose 5 % 50 mL Inject 300 mg into the vein every 6 (six) hours. X 5 days  . docusate sodium (COLACE) 100 MG capsule Take 1 capsule (100 mg total) by mouth 2 (two) times daily.  . ferrous sulfate 325 (65 FE) MG tablet Take 325 mg by mouth daily with breakfast.   . furosemide (LASIX) 20 MG tablet Take 2 tablets (40 mg total) by mouth daily.  Marland Kitchen gabapentin (NEURONTIN) 300 MG capsule Take 300 mg by mouth 3 (three) times daily.   Marland Kitchen guaiFENesin (MUCINEX) 600 MG 12 hr tablet Take 600 mg by mouth 2 (two) times daily. X 7 days  . insulin aspart (NOVOLOG) 100 UNIT/ML injection Inject 15 Units into the skin 3 (three) times daily with meals. Inject 11 units subcutaneously after meals  . insulin glargine (LANTUS) 100 UNIT/ML injection Inject 57 Units into the skin at bedtime.   Marland Kitchen ipratropium-albuterol (DUONEB) 0.5-2.5 (3) MG/3ML SOLN Take 3 mLs by nebulization every 6 (six) hours. X 3 days and every 6 hours as needed x 14 days  . LevoFLOXacin in D5W  (LEVAQUIN IV) Inject 750 mg into the vein. Every other day for 7 administrations  . lisinopril (ZESTRIL) 2.5 MG tablet Take 2 tablets (5 mg total) by mouth daily.  . metoprolol succinate (TOPROL-XL) 25 MG 24 hr tablet Take 25 mg by mouth daily.  . Multiple Vitamin (MULTIVITAMIN) tablet Take 1 tablet by mouth daily.  . Nutritional Supplements (NUTRITIONAL SHAKE PO) House Shake - Give 120cc by mouth three times daily  . ondansetron (ZOFRAN) 4 MG tablet Take 4 mg by mouth every 6 (six) hours as needed for nausea or vomiting.  . pantoprazole (PROTONIX) 40 MG tablet Take 40 mg by mouth daily.  . polyethylene glycol (MIRALAX / GLYCOLAX) packet Take 17 g by mouth 2 (two) times daily.   . potassium chloride SA (K-DUR,KLOR-CON) 20 MEQ tablet Take 1 tablet (20  mEq total) by mouth daily.  . predniSONE (DELTASONE) 20 MG tablet Give 2 tablets by mouth in the evening x 5 days for wheezing  . Probiotic Product (PROBIOTIC DAILY) CAPS Take 1 capsule by mouth daily.  . promethazine (PHENERGAN) 25 MG suppository Place 25 mg rectally every 6 (six) hours as needed for nausea or vomiting. X 3 days  . sennosides-docusate sodium (SENOKOT-S) 8.6-50 MG tablet Take 1 tablet by mouth 2 (two) times daily. X 3 days  . Skin Protectants, Misc. (CALAZIME SKIN PROTECTANT EX) Apply 1 application topically 2 (two) times daily. Apply to buttock every shift   . Skin Protectants, Misc. (EUCERIN) cream Apply to BLE topically two times daily for dry skin  . traMADol (ULTRAM) 50 MG tablet Take 25 mg by mouth every 12 (twelve) hours as needed.  . traZODone (DESYREL) 100 MG tablet Take 100 mg by mouth at bedtime as needed for sleep.  . [DISCONTINUED] Multiple Vitamins-Minerals (DECUBI-VITE) CAPS Take 1 capsule by mouth daily.  . [DISCONTINUED] protein supplement shake (PREMIER PROTEIN) LIQD Take 325 mLs (11 oz total) by mouth 2 (two) times daily between meals. (Patient not taking: Reported on 12/24/2017)   No facility-administered encounter  medications on file as of 12/24/2017.     Review of Systems  Unable to perform ROS: Other (memory loss)     There is no immunization history on file for this patient. Pertinent  Health Maintenance Due  Topic Date Due  . FOOT EXAM  08/21/2018 (Originally 09/14/1944)  . INFLUENZA VACCINE  09/25/2018 (Originally 05/16/2017)  . OPHTHALMOLOGY EXAM  09/25/2018 (Originally 09/14/1944)  . PNA vac Low Risk Adult (1 of 2 - PCV13) 09/25/2018 (Originally 09/14/2001)  . HEMOGLOBIN A1C  03/28/2018   No flowsheet data found. Functional Status Survey:    Vitals:   12/24/17 1017  BP: (!) 101/56  Pulse: (!) 58  Resp: 16  Temp: 98.1 F (36.7 C)  SpO2: 93%  Weight: 213 lb 3.2 oz (96.7 kg)  Height: 6' (1.829 m)   Body mass index is 28.92 kg/m. Physical Exam  Constitutional: He appears well-developed and well-nourished.  Frail appearing with min conversational dyspnea. [place] O2 NOT intact. Daughter at bedside; IVF running into left arm via midline  HENT:  Mouth/Throat: Oropharynx is clear and moist.  MM dry; no oral thrush  Eyes: Pupils are equal, round, and reactive to light. No scleral icterus.  Neck: Neck supple. Carotid bruit is not present. No thyromegaly present.  Cardiovascular: Normal rate and intact distal pulses. An irregularly irregular rhythm present. Exam reveals no gallop and no friction rub.  Murmur (1/6 SEM) heard. Trace LE edema b/l. No calf TTP; left arm midline intact with no redness or d/c at insertion site  Pulmonary/Chest: Accessory muscle usage present. He has decreased breath sounds (R>L base). He has no wheezes. He has no rhonchi. He has rales. He exhibits no tenderness.  Abdominal: Soft. Normal appearance. He exhibits distension. He exhibits no abdominal bruit, no pulsatile midline mass and no mass. Bowel sounds are decreased. There is no hepatomegaly. There is no tenderness. There is no rigidity, no rebound and no guarding. No hernia.  Tinkling BS  Musculoskeletal: He  exhibits edema.  Lymphadenopathy:    He has no cervical adenopathy.  Neurological: He is alert.  Skin: Skin is warm and dry. No rash noted.  Right foot dsg c/d/i  Psychiatric: He has a normal mood and affect. His behavior is normal.  Vitals reviewed.   Labs reviewed: Recent Labs  11/15/17 0414 11/16/17 0550 11/17/17 0710  NA 135 138 137  K 3.6 3.4* 3.5  CL 102 104 103  CO2 24 26 26   GLUCOSE 305* 222* 189*  BUN 27* 20 16  CREATININE 1.36* 1.18 1.15  CALCIUM 8.0* 8.4* 8.5*   Recent Labs    11/14/17 2130 11/15/17 0414 11/16/17 0550  AST 47* 31 53*  ALT 36 29 45  ALKPHOS 98 75 71  BILITOT 0.6 0.8 0.5  PROT 8.0 6.6 6.7  ALBUMIN 2.9* 2.4* 2.4*   Recent Labs    04/06/17 1636  11/14/17 2130 11/14/17 2137 11/15/17 0414 11/16/17 0550  WBC 10.9*   < > 19.4*  --  16.6* 10.4  NEUTROABS 7.9*  --  16.1*  --  12.5*  --   HGB 13.9   < > 13.7 14.6 11.4* 11.3*  HCT 41.3   < > 39.5 43.0 34.5* 34.4*  MCV 83.9   < > 84.8  --  85.0 84.5  PLT 230   < > 417*  --  357 366   < > = values in this interval not displayed.   Lab Results  Component Value Date   TSH 2.236 02/19/2012   Lab Results  Component Value Date   HGBA1C 9.9 09/27/2017   Lab Results  Component Value Date   CHOL 102 09/27/2017   HDL 27 (A) 09/27/2017   LDLCALC 52 09/27/2017   TRIG 117 09/27/2017    Significant Diagnostic Results in last 30 days:  No results found.  Assessment/Plan   ICD-10-CM   1. HCAP (healthcare-associated pneumonia) J18.9   2. Ileus (HCC) K56.7    r/o C diff  3. Type 2 diabetes mellitus with diabetic neuropathy, with long-term current use of insulin (HCC) E11.40    Z79.4   4. Essential hypertension I10   5. Ischemic cardiomyopathy I25.5     Change Vitamin D to 2000 units daily  Cont other meds as ordered - finish IV abx  PT/OT/ST as ordered  F/u with specialists as scheduled  Will follow  Labs/tests ordered: C diff toxin   Zyanya Glaza S. Perlie Gold   [facility] [address] [place], [place] [address] [phone] Cell (Monday-Friday 8 AM - 5 PM) [phone] After 5 PM and follow prompts

## 2017-12-25 ENCOUNTER — Emergency Department (HOSPITAL_COMMUNITY): Payer: Medicare Other

## 2017-12-25 ENCOUNTER — Encounter (HOSPITAL_COMMUNITY): Payer: Self-pay | Admitting: Emergency Medicine

## 2017-12-25 ENCOUNTER — Other Ambulatory Visit: Payer: Self-pay

## 2017-12-25 ENCOUNTER — Inpatient Hospital Stay (HOSPITAL_COMMUNITY)
Admission: EM | Admit: 2017-12-25 | Discharge: 2018-01-05 | DRG: 871 | Disposition: A | Discharge status: Hospice | Payer: Medicare Other | Attending: Family Medicine | Admitting: Family Medicine

## 2017-12-25 DIAGNOSIS — A419 Sepsis, unspecified organism: Secondary | ICD-10-CM

## 2017-12-25 DIAGNOSIS — K819 Cholecystitis, unspecified: Secondary | ICD-10-CM

## 2017-12-25 DIAGNOSIS — B370 Candidal stomatitis: Secondary | ICD-10-CM | POA: Diagnosis present

## 2017-12-25 DIAGNOSIS — Z96643 Presence of artificial hip joint, bilateral: Secondary | ICD-10-CM | POA: Diagnosis present

## 2017-12-25 DIAGNOSIS — I4891 Unspecified atrial fibrillation: Secondary | ICD-10-CM | POA: Diagnosis present

## 2017-12-25 DIAGNOSIS — Z515 Encounter for palliative care: Secondary | ICD-10-CM | POA: Diagnosis present

## 2017-12-25 DIAGNOSIS — Z1612 Extended spectrum beta lactamase (ESBL) resistance: Secondary | ICD-10-CM | POA: Diagnosis not present

## 2017-12-25 DIAGNOSIS — A4151 Sepsis due to Escherichia coli [E. coli]: Secondary | ICD-10-CM | POA: Diagnosis present

## 2017-12-25 DIAGNOSIS — B379 Candidiasis, unspecified: Secondary | ICD-10-CM | POA: Diagnosis not present

## 2017-12-25 DIAGNOSIS — I130 Hypertensive heart and chronic kidney disease with heart failure and stage 1 through stage 4 chronic kidney disease, or unspecified chronic kidney disease: Secondary | ICD-10-CM | POA: Diagnosis present

## 2017-12-25 DIAGNOSIS — E1169 Type 2 diabetes mellitus with other specified complication: Secondary | ICD-10-CM | POA: Diagnosis present

## 2017-12-25 DIAGNOSIS — E1165 Type 2 diabetes mellitus with hyperglycemia: Secondary | ICD-10-CM | POA: Diagnosis present

## 2017-12-25 DIAGNOSIS — K651 Peritoneal abscess: Secondary | ICD-10-CM | POA: Diagnosis not present

## 2017-12-25 DIAGNOSIS — F17210 Nicotine dependence, cigarettes, uncomplicated: Secondary | ICD-10-CM | POA: Diagnosis present

## 2017-12-25 DIAGNOSIS — L89629 Pressure ulcer of left heel, unspecified stage: Secondary | ICD-10-CM | POA: Diagnosis not present

## 2017-12-25 DIAGNOSIS — Z79899 Other long term (current) drug therapy: Secondary | ICD-10-CM

## 2017-12-25 DIAGNOSIS — L8990 Pressure ulcer of unspecified site, unspecified stage: Secondary | ICD-10-CM

## 2017-12-25 DIAGNOSIS — J449 Chronic obstructive pulmonary disease, unspecified: Secondary | ICD-10-CM | POA: Diagnosis present

## 2017-12-25 DIAGNOSIS — L89610 Pressure ulcer of right heel, unstageable: Secondary | ICD-10-CM | POA: Diagnosis not present

## 2017-12-25 DIAGNOSIS — E43 Unspecified severe protein-calorie malnutrition: Secondary | ICD-10-CM | POA: Diagnosis present

## 2017-12-25 DIAGNOSIS — R0600 Dyspnea, unspecified: Secondary | ICD-10-CM

## 2017-12-25 DIAGNOSIS — E669 Obesity, unspecified: Secondary | ICD-10-CM | POA: Diagnosis present

## 2017-12-25 DIAGNOSIS — R627 Adult failure to thrive: Secondary | ICD-10-CM | POA: Diagnosis present

## 2017-12-25 DIAGNOSIS — L89313 Pressure ulcer of right buttock, stage 3: Secondary | ICD-10-CM | POA: Diagnosis present

## 2017-12-25 DIAGNOSIS — E11621 Type 2 diabetes mellitus with foot ulcer: Secondary | ICD-10-CM | POA: Diagnosis not present

## 2017-12-25 DIAGNOSIS — L89620 Pressure ulcer of left heel, unstageable: Secondary | ICD-10-CM | POA: Diagnosis present

## 2017-12-25 DIAGNOSIS — N179 Acute kidney failure, unspecified: Secondary | ICD-10-CM | POA: Diagnosis present

## 2017-12-25 DIAGNOSIS — J9811 Atelectasis: Secondary | ICD-10-CM | POA: Diagnosis present

## 2017-12-25 DIAGNOSIS — Z978 Presence of other specified devices: Secondary | ICD-10-CM | POA: Diagnosis not present

## 2017-12-25 DIAGNOSIS — E1151 Type 2 diabetes mellitus with diabetic peripheral angiopathy without gangrene: Secondary | ICD-10-CM | POA: Diagnosis present

## 2017-12-25 DIAGNOSIS — B9689 Other specified bacterial agents as the cause of diseases classified elsewhere: Secondary | ICD-10-CM | POA: Diagnosis not present

## 2017-12-25 DIAGNOSIS — J9601 Acute respiratory failure with hypoxia: Secondary | ICD-10-CM | POA: Diagnosis present

## 2017-12-25 DIAGNOSIS — A4181 Sepsis due to Enterococcus: Secondary | ICD-10-CM | POA: Diagnosis present

## 2017-12-25 DIAGNOSIS — Z7401 Bed confinement status: Secondary | ICD-10-CM

## 2017-12-25 DIAGNOSIS — Z8701 Personal history of pneumonia (recurrent): Secondary | ICD-10-CM

## 2017-12-25 DIAGNOSIS — L89609 Pressure ulcer of unspecified heel, unspecified stage: Secondary | ICD-10-CM | POA: Diagnosis not present

## 2017-12-25 DIAGNOSIS — K659 Peritonitis, unspecified: Secondary | ICD-10-CM | POA: Diagnosis not present

## 2017-12-25 DIAGNOSIS — I509 Heart failure, unspecified: Secondary | ICD-10-CM | POA: Diagnosis not present

## 2017-12-25 DIAGNOSIS — E1142 Type 2 diabetes mellitus with diabetic polyneuropathy: Secondary | ICD-10-CM | POA: Diagnosis present

## 2017-12-25 DIAGNOSIS — K750 Abscess of liver: Secondary | ICD-10-CM | POA: Diagnosis present

## 2017-12-25 DIAGNOSIS — G9341 Metabolic encephalopathy: Secondary | ICD-10-CM | POA: Diagnosis present

## 2017-12-25 DIAGNOSIS — K760 Fatty (change of) liver, not elsewhere classified: Secondary | ICD-10-CM | POA: Diagnosis present

## 2017-12-25 DIAGNOSIS — E1122 Type 2 diabetes mellitus with diabetic chronic kidney disease: Secondary | ICD-10-CM | POA: Diagnosis present

## 2017-12-25 DIAGNOSIS — K8012 Calculus of gallbladder with acute and chronic cholecystitis without obstruction: Secondary | ICD-10-CM | POA: Diagnosis not present

## 2017-12-25 DIAGNOSIS — Z6828 Body mass index (BMI) 28.0-28.9, adult: Secondary | ICD-10-CM

## 2017-12-25 DIAGNOSIS — Z8719 Personal history of other diseases of the digestive system: Secondary | ICD-10-CM

## 2017-12-25 DIAGNOSIS — T17908A Unspecified foreign body in respiratory tract, part unspecified causing other injury, initial encounter: Secondary | ICD-10-CM

## 2017-12-25 DIAGNOSIS — A498 Other bacterial infections of unspecified site: Secondary | ICD-10-CM | POA: Diagnosis not present

## 2017-12-25 DIAGNOSIS — I5022 Chronic systolic (congestive) heart failure: Secondary | ICD-10-CM | POA: Diagnosis not present

## 2017-12-25 DIAGNOSIS — Z66 Do not resuscitate: Secondary | ICD-10-CM | POA: Diagnosis present

## 2017-12-25 DIAGNOSIS — R6521 Severe sepsis with septic shock: Secondary | ICD-10-CM | POA: Diagnosis not present

## 2017-12-25 DIAGNOSIS — R1312 Dysphagia, oropharyngeal phase: Secondary | ICD-10-CM | POA: Diagnosis not present

## 2017-12-25 DIAGNOSIS — I5023 Acute on chronic systolic (congestive) heart failure: Secondary | ICD-10-CM | POA: Diagnosis present

## 2017-12-25 DIAGNOSIS — Z95828 Presence of other vascular implants and grafts: Secondary | ICD-10-CM | POA: Diagnosis not present

## 2017-12-25 DIAGNOSIS — L89619 Pressure ulcer of right heel, unspecified stage: Secondary | ICD-10-CM | POA: Diagnosis not present

## 2017-12-25 DIAGNOSIS — F0390 Unspecified dementia without behavioral disturbance: Secondary | ICD-10-CM | POA: Diagnosis present

## 2017-12-25 DIAGNOSIS — E785 Hyperlipidemia, unspecified: Secondary | ICD-10-CM | POA: Diagnosis present

## 2017-12-25 DIAGNOSIS — B952 Enterococcus as the cause of diseases classified elsewhere: Secondary | ICD-10-CM | POA: Diagnosis not present

## 2017-12-25 DIAGNOSIS — E119 Type 2 diabetes mellitus without complications: Secondary | ICD-10-CM | POA: Diagnosis not present

## 2017-12-25 DIAGNOSIS — B356 Tinea cruris: Secondary | ICD-10-CM | POA: Diagnosis not present

## 2017-12-25 DIAGNOSIS — N183 Chronic kidney disease, stage 3 (moderate): Secondary | ICD-10-CM | POA: Diagnosis present

## 2017-12-25 DIAGNOSIS — B966 Bacteroides fragilis [B. fragilis] as the cause of diseases classified elsewhere: Secondary | ICD-10-CM | POA: Diagnosis not present

## 2017-12-25 DIAGNOSIS — K269 Duodenal ulcer, unspecified as acute or chronic, without hemorrhage or perforation: Secondary | ICD-10-CM | POA: Diagnosis present

## 2017-12-25 DIAGNOSIS — D509 Iron deficiency anemia, unspecified: Secondary | ICD-10-CM | POA: Diagnosis present

## 2017-12-25 DIAGNOSIS — K8010 Calculus of gallbladder with chronic cholecystitis without obstruction: Secondary | ICD-10-CM | POA: Diagnosis not present

## 2017-12-25 DIAGNOSIS — K810 Acute cholecystitis: Secondary | ICD-10-CM | POA: Diagnosis present

## 2017-12-25 DIAGNOSIS — Z794 Long term (current) use of insulin: Secondary | ICD-10-CM

## 2017-12-25 DIAGNOSIS — I4892 Unspecified atrial flutter: Secondary | ICD-10-CM | POA: Diagnosis present

## 2017-12-25 DIAGNOSIS — K219 Gastro-esophageal reflux disease without esophagitis: Secondary | ICD-10-CM | POA: Diagnosis present

## 2017-12-25 DIAGNOSIS — Z7189 Other specified counseling: Secondary | ICD-10-CM | POA: Diagnosis not present

## 2017-12-25 DIAGNOSIS — B9620 Unspecified Escherichia coli [E. coli] as the cause of diseases classified elsewhere: Secondary | ICD-10-CM | POA: Diagnosis not present

## 2017-12-25 LAB — URINALYSIS, ROUTINE W REFLEX MICROSCOPIC
BACTERIA UA: NONE SEEN
Bilirubin Urine: NEGATIVE
GLUCOSE, UA: 50 mg/dL — AB
Ketones, ur: 5 mg/dL — AB
LEUKOCYTES UA: NEGATIVE
NITRITE: NEGATIVE
PH: 5 (ref 5.0–8.0)
PROTEIN: 30 mg/dL — AB
SPECIFIC GRAVITY, URINE: 1.028 (ref 1.005–1.030)
Squamous Epithelial / LPF: NONE SEEN

## 2017-12-25 LAB — CBC WITH DIFFERENTIAL/PLATELET
BASOS ABS: 0 10*3/uL (ref 0.0–0.1)
BASOS PCT: 0 %
EOS ABS: 0 10*3/uL (ref 0.0–0.7)
EOS PCT: 0 %
HEMATOCRIT: 39.7 % (ref 39.0–52.0)
Hemoglobin: 13 g/dL (ref 13.0–17.0)
Lymphocytes Relative: 6 %
Lymphs Abs: 1 10*3/uL (ref 0.7–4.0)
MCH: 28.6 pg (ref 26.0–34.0)
MCHC: 32.7 g/dL (ref 30.0–36.0)
MCV: 87.4 fL (ref 78.0–100.0)
MONO ABS: 0.6 10*3/uL (ref 0.1–1.0)
MONOS PCT: 3 %
NEUTROS ABS: 14.8 10*3/uL — AB (ref 1.7–7.7)
Neutrophils Relative %: 91 %
PLATELETS: 356 10*3/uL (ref 150–400)
RBC: 4.54 MIL/uL (ref 4.22–5.81)
RDW: 15.4 % (ref 11.5–15.5)
WBC: 16.3 10*3/uL — ABNORMAL HIGH (ref 4.0–10.5)

## 2017-12-25 LAB — I-STAT CG4 LACTIC ACID, ED: LACTIC ACID, VENOUS: 4.35 mmol/L — AB (ref 0.5–1.9)

## 2017-12-25 LAB — COMPREHENSIVE METABOLIC PANEL
ALT: 82 U/L — AB (ref 17–63)
AST: 113 U/L — ABNORMAL HIGH (ref 15–41)
Albumin: 2.1 g/dL — ABNORMAL LOW (ref 3.5–5.0)
Alkaline Phosphatase: 108 U/L (ref 38–126)
Anion gap: 13 (ref 5–15)
BUN: 37 mg/dL — AB (ref 6–20)
CHLORIDE: 100 mmol/L — AB (ref 101–111)
CO2: 22 mmol/L (ref 22–32)
CREATININE: 1.43 mg/dL — AB (ref 0.61–1.24)
Calcium: 8.6 mg/dL — ABNORMAL LOW (ref 8.9–10.3)
GFR, EST AFRICAN AMERICAN: 51 mL/min — AB (ref >60)
GFR, EST NON AFRICAN AMERICAN: 44 mL/min — AB (ref >60)
Glucose, Bld: 343 mg/dL — ABNORMAL HIGH (ref 65–99)
Potassium: 4.4 mmol/L (ref 3.5–5.1)
Sodium: 135 mmol/L (ref 135–145)
TOTAL PROTEIN: 7.2 g/dL (ref 6.5–8.1)
Total Bilirubin: 0.8 mg/dL (ref 0.3–1.2)

## 2017-12-25 LAB — TROPONIN I
TROPONIN I: 0.03 ng/mL — AB (ref <0.03)
Troponin I: 0.03 ng/mL (ref <0.03)

## 2017-12-25 LAB — BRAIN NATRIURETIC PEPTIDE: B NATRIURETIC PEPTIDE 5: 283.8 pg/mL — AB (ref 0.0–100.0)

## 2017-12-25 LAB — BLOOD GAS, VENOUS
ACID-BASE DEFICIT: 1.5 mmol/L (ref 0.0–2.0)
BICARBONATE: 23 mmol/L (ref 20.0–28.0)
O2 Saturation: 35.4 %
PATIENT TEMPERATURE: 98.6
PH VEN: 7.375 (ref 7.250–7.430)
pCO2, Ven: 40.3 mmHg — ABNORMAL LOW (ref 44.0–60.0)

## 2017-12-25 LAB — CBG MONITORING, ED
GLUCOSE-CAPILLARY: 237 mg/dL — AB (ref 65–99)
Glucose-Capillary: 191 mg/dL — ABNORMAL HIGH (ref 65–99)

## 2017-12-25 LAB — PROTIME-INR
INR: 1.21
Prothrombin Time: 15.2 seconds (ref 11.4–15.2)

## 2017-12-25 LAB — MRSA PCR SCREENING: MRSA by PCR: NEGATIVE

## 2017-12-25 LAB — LACTIC ACID, PLASMA: LACTIC ACID, VENOUS: 2.1 mmol/L — AB (ref 0.5–1.9)

## 2017-12-25 LAB — PROCALCITONIN: Procalcitonin: 4.34 ng/mL

## 2017-12-25 MED ORDER — CHLORHEXIDINE GLUCONATE CLOTH 2 % EX PADS
6.0000 | MEDICATED_PAD | Freq: Every day | CUTANEOUS | Status: DC
  Administered 2017-12-26: 6 via TOPICAL

## 2017-12-25 MED ORDER — SODIUM CHLORIDE 0.9% FLUSH: 10.0000 mL | INTRAVENOUS | Status: DC | PRN

## 2017-12-25 MED ORDER — HEPARIN SODIUM (PORCINE) 5000 UNIT/ML IJ SOLN: 5000.0000 [IU] | Freq: Three times a day (TID) | INTRAMUSCULAR | Status: DC

## 2017-12-25 MED ORDER — SODIUM CHLORIDE 0.9 % IJ SOLN
INTRAMUSCULAR | Status: AC
  Filled 2017-12-25: qty 50

## 2017-12-25 MED ORDER — BIOGAIA PROBIOTIC PO LIQD: 5.0000 mL | Freq: Every day | ORAL | Status: DC

## 2017-12-25 MED ORDER — SODIUM CHLORIDE 0.9 % IV BOLUS (SEPSIS): 1000.0000 mL | Freq: Once | INTRAVENOUS | Status: DC

## 2017-12-25 MED ORDER — IOPAMIDOL (ISOVUE-300) INJECTION 61%
75.0000 mL | Freq: Once | INTRAVENOUS | Status: AC | PRN
  Administered 2017-12-25: 75 mL via INTRAVENOUS

## 2017-12-25 MED ORDER — SODIUM CHLORIDE 0.9 % IV SOLN
250.0000 mL | INTRAVENOUS | Status: DC | PRN
  Administered 2018-01-03: 250 mL via INTRAVENOUS

## 2017-12-25 MED ORDER — INSULIN ASPART 100 UNIT/ML ~~LOC~~ SOLN
0.0000 [IU] | SUBCUTANEOUS | Status: DC
  Administered 2017-12-25: 7 [IU] via SUBCUTANEOUS
  Administered 2017-12-26: 3 [IU] via SUBCUTANEOUS
  Administered 2017-12-26 (×2): 4 [IU] via SUBCUTANEOUS
  Administered 2017-12-26: 7 [IU] via SUBCUTANEOUS
  Administered 2017-12-26 – 2017-12-27 (×2): 3 [IU] via SUBCUTANEOUS
  Administered 2017-12-27: 15 [IU] via SUBCUTANEOUS
  Administered 2017-12-27: 3 [IU] via SUBCUTANEOUS
  Administered 2017-12-27: 15 [IU] via SUBCUTANEOUS
  Administered 2017-12-27: 7 [IU] via SUBCUTANEOUS
  Administered 2017-12-28 (×2): 3 [IU] via SUBCUTANEOUS
  Administered 2017-12-28 (×2): 7 [IU] via SUBCUTANEOUS
  Administered 2017-12-28 – 2017-12-29 (×4): 4 [IU] via SUBCUTANEOUS
  Administered 2017-12-29: 7 [IU] via SUBCUTANEOUS
  Filled 2017-12-25: qty 1

## 2017-12-25 MED ORDER — LINEZOLID 600 MG/300ML IV SOLN
600.0000 mg | INTRAVENOUS | Status: AC
  Administered 2017-12-25: 600 mg via INTRAVENOUS
  Filled 2017-12-25: qty 300

## 2017-12-25 MED ORDER — ACETAMINOPHEN 325 MG PO TABS: 650.0000 mg | ORAL_TABLET | ORAL | Status: DC | PRN

## 2017-12-25 MED ORDER — INSULIN GLARGINE 100 UNIT/ML ~~LOC~~ SOLN
30.0000 [IU] | Freq: Every day | SUBCUTANEOUS | Status: DC
  Administered 2017-12-25 – 2017-12-26 (×2): 30 [IU] via SUBCUTANEOUS
  Filled 2017-12-25 (×2): qty 0.3

## 2017-12-25 MED ORDER — SODIUM CHLORIDE 0.9% FLUSH
10.0000 mL | Freq: Two times a day (BID) | INTRAVENOUS | Status: DC
  Administered 2017-12-25 – 2017-12-26 (×2): 20 mL
  Administered 2017-12-26 – 2017-12-27 (×3): 10 mL

## 2017-12-25 MED ORDER — LINEZOLID 600 MG/300ML IV SOLN
600.0000 mg | Freq: Two times a day (BID) | INTRAVENOUS | Status: DC
  Administered 2017-12-25 – 2017-12-27 (×5): 600 mg via INTRAVENOUS
  Filled 2017-12-25 (×6): qty 300

## 2017-12-25 MED ORDER — SODIUM CHLORIDE 0.9 % IV SOLN
1.0000 g | INTRAVENOUS | Status: AC
  Administered 2017-12-25: 1 g via INTRAVENOUS
  Filled 2017-12-25: qty 1

## 2017-12-25 MED ORDER — FLORANEX PO PACK
1.0000 g | PACK | Freq: Every day | ORAL | Status: DC
  Administered 2017-12-27 – 2018-01-02 (×7): 1 g via ORAL
  Filled 2017-12-25 (×11): qty 1

## 2017-12-25 MED ORDER — IOPAMIDOL (ISOVUE-300) INJECTION 61%
INTRAVENOUS | Status: AC
  Filled 2017-12-25: qty 75

## 2017-12-25 MED ORDER — SODIUM CHLORIDE 0.9 % IV BOLUS (SEPSIS)
3000.0000 mL | Freq: Once | INTRAVENOUS | Status: AC
  Administered 2017-12-25: 3000 mL via INTRAVENOUS

## 2017-12-25 MED ORDER — SODIUM CHLORIDE 0.9 % IV SOLN
1.0000 g | Freq: Two times a day (BID) | INTRAVENOUS | Status: DC
  Administered 2017-12-25 – 2017-12-27 (×4): 1 g via INTRAVENOUS
  Filled 2017-12-25 (×5): qty 1

## 2017-12-25 MED ORDER — ONDANSETRON HCL 4 MG/2ML IJ SOLN: 4.0000 mg | Freq: Four times a day (QID) | INTRAMUSCULAR | Status: DC | PRN

## 2017-12-25 NOTE — ED Notes (Signed)
Fluids paused; phlebotomy at bedside.

## 2017-12-25 NOTE — ED Notes (Addendum)
Per Alyssa PA only give 2000 ml bolus; NOT total 3000 ml.

## 2017-12-25 NOTE — Progress Notes (Signed)
Pharmacy Antibiotic Note  Preston Moore is a 84 y.o. male admitted on 12/25/2017 with sepsis.  Pharmacy has been consulted for Linezolid, meropenem dosing.  PMH is significant for recent/current broad spectrum antibiotic use including vancomycin, cefepime, and Levaquin for pneumonia.  Unclear current source of infection; multiple decubitus ulcers, abdominal tenderness (CT pending), CXR suggestive of mild CHF, and mall bilateral pleural effusions.  Today, 12/25/2017: SCr 1.43, CrCl ~ 49 ml/min WBC 16.3 Afebrile Lactic acid 4.35  Plan:  Linezolid 600mg  IV q12h  Meropenem 1g IV q12h  Follow up renal fxn, culture results, and clinical course.   Height: 6' (182.9 cm) Weight: 213 lb (96.6 kg) IBW/kg (Calculated) : 77.6  Temp (24hrs), Avg:98.2 F (36.8 C), Min:98.2 F (36.8 C), Max:98.2 F (36.8 C)  Recent Labs  Lab 12/24/17 12/25/17 1026 12/25/17 1029  WBC 21.7 16.3*  --   CREATININE 1.3 1.43*  --   LATICACIDVEN  --   --  4.35*    Estimated Creatinine Clearance: 48.8 mL/min (A) (by C-G formula based on SCr of 1.43 mg/dL (H)).    No Known Allergies  Antimicrobials this admission: 3/12 Linezolid >>  3/12 Meropenem >>   Dose adjustments this admission:   Microbiology results: 1/30 Influneza A: positive 3/12 BCx:  3/12 UCx:     Thank you for allowing pharmacy to be a part of this patient's care.  Gretta Arab PharmD, BCPS Pager [phone] 12/25/2017 11:05 AM

## 2017-12-25 NOTE — ED Notes (Signed)
Pt transported to DG.  

## 2017-12-25 NOTE — H&P (Signed)
PULMONARY / CRITICAL CARE MEDICINE   Name: SLAYTON LUBITZ MRN: [id number] DOB: January 07, 1934    ADMISSION DATE:  12/25/2017 CONSULTATION DATE:  12/25/2017   REFERRING MD:  ED  CHIEF COMPLAINT: Tachycardia, sepsis  HISTORY OF PRESENT ILLNESS:   84 year old nursing home resident, chronically bedbound after bilateral hip replacement about 2 years ago with bilateral heel ulcers and recent admission in February for influenza and new systolic heart failure with EF of 35% He was being treated at nursing facility with IV Levaquin/clindamycin and cefepime but continued to have tachycardia and soft blood pressure and fevers hence sent to the emergency room.  Found to have soft blood pressure, received 2 L fluid, lactate 4.3, with leukocytosis blood pressure improved with fluids CT abdomen showing subdiaphragmatic collection and liver abscess and ultrasound suggesting acalculus cholecystitis  PAST MEDICAL HISTORY :  He  has a past medical history of Atrial flutter ([place]), B12 deficiency (02/22/2012), Closed right hip fracture, initial encounter ([place]) (04/06/2017), Diabetes mellitus, Hyperlipidemia, Hypertension, Ileus ([place]) (02/29/2012), Malnutrition ([place]) (02/29/2012), Neuropathy, Non compliance with medical treatment (02/29/2012), Pancreatitis (02/22/2012), SBO (small bowel obstruction) ([place]) (02/23/2012), and Syncope and collapse.  PAST SURGICAL HISTORY: He  has a past surgical history that includes Laminectomy (1965); Back surgery; and Compression hip screw (Right, 04/07/2017).  No Known Allergies  No current facility-administered medications on file prior to encounter.    Current Outpatient Medications on File Prior to Encounter  Medication Sig  . acetaminophen (TYLENOL) 500 MG tablet Take 1,000 mg by mouth every 8 (eight) hours as needed.   Marland Kitchen albuterol (ACCUNEB) 0.63 MG/3ML nebulizer solution Take 3 mLs (0.63 mg total) by nebulization every 2 (two) hours as needed for wheezing or shortness of breath.  Marland Kitchen  albuterol (PROVENTIL) (2.5 MG/3ML) 0.083% nebulizer solution Take 3 mLs (2.5 mg total) by nebulization 3 (three) times daily.  Marland Kitchen atorvastatin (LIPITOR) 10 MG tablet Take 10 mg by mouth at bedtime.  . benzonatate (TESSALON) 200 MG capsule Take 1 capsule (200 mg total) by mouth 3 (three) times daily as needed for cough.  . ceFEPime (MAXIPIME) IVPB Inject 2 g into the vein every 12 (twelve) hours.  . Cholecalciferol (VITAMIN D3) 10000 units TABS Take 1,000 Units by mouth daily.  Marland Kitchen docusate sodium (COLACE) 100 MG capsule Take 1 capsule (100 mg total) by mouth 2 (two) times daily.  . ferrous sulfate 325 (65 FE) MG tablet Take 325 mg by mouth daily with breakfast.   . furosemide (LASIX) 40 MG tablet Take 40 mg by mouth.  . gabapentin (NEURONTIN) 300 MG capsule Take 300 mg by mouth 3 (three) times daily.   Marland Kitchen guaiFENesin (MUCINEX) 600 MG 12 hr tablet Take 600 mg by mouth 2 (two) times daily. X 7 days  . insulin aspart (NOVOLOG) 100 UNIT/ML injection Inject 15 Units into the skin 3 (three) times daily with meals. Inject 11 units subcutaneously after meals  . insulin glargine (LANTUS) 100 UNIT/ML injection Inject 57 Units into the skin at bedtime.   Marland Kitchen ipratropium-albuterol (DUONEB) 0.5-2.5 (3) MG/3ML SOLN Take 3 mLs by nebulization every 6 (six) hours. X 3 days and every 6 hours as needed x 14 days  . lisinopril (PRINIVIL,ZESTRIL) 5 MG tablet Take 5 mg by mouth daily.  . metoprolol succinate (TOPROL-XL) 25 MG 24 hr tablet Take 25 mg by mouth daily.  . ondansetron (ZOFRAN) 4 MG tablet Take 4 mg by mouth every 6 (six) hours as needed for nausea or vomiting.  . potassium chloride SA (K-DUR,KLOR-CON)  20 MEQ tablet Take 1 tablet (20 mEq total) by mouth daily.  . Probiotic Product (PROBIOTIC DAILY) CAPS Take 1 capsule by mouth daily.  . promethazine (PHENERGAN) 25 MG suppository Place 25 mg rectally every 6 (six) hours as needed for nausea or vomiting. X 3 days  . Skin Protectants, Misc. (EUCERIN) cream Apply to  BLE topically two times daily for dry skin  . traMADol (ULTRAM) 50 MG tablet Take 25 mg by mouth every 12 (twelve) hours as needed for moderate pain.   . traZODone (DESYREL) 100 MG tablet Take 100 mg by mouth at bedtime as needed for sleep.  . Amino Acids-Protein Hydrolys (FEEDING SUPPLEMENT, PRO-STAT SUGAR FREE 64,) LIQD Take 30 mLs 2 (two) times daily by mouth.  . clindamycin in dextrose 5 % 50 mL Inject 300 mg into the vein every 6 (six) hours. X 5 days  . LevoFLOXacin in D5W (LEVAQUIN IV) Inject 750 mg into the vein. Every other day for 7 administrations  . lisinopril (ZESTRIL) 2.5 MG tablet Take 2 tablets (5 mg total) by mouth daily. (Patient not taking: Reported on 12/25/2017)  . Multiple Vitamin (MULTIVITAMIN) tablet Take 1 tablet by mouth daily.  . Nutritional Supplements (NUTRITIONAL SHAKE PO) House Shake - Give 120cc by mouth three times daily  . pantoprazole (PROTONIX) 40 MG tablet Take 40 mg by mouth daily.  . polyethylene glycol (MIRALAX / GLYCOLAX) packet Take 17 g by mouth 2 (two) times daily.   . sennosides-docusate sodium (SENOKOT-S) 8.6-50 MG tablet Take 1 tablet by mouth 2 (two) times daily. X 3 days  . Skin Protectants, Misc. (CALAZIME SKIN PROTECTANT EX) Apply 1 application topically 2 (two) times daily. Apply to buttock every shift     FAMILY HISTORY:  His indicated that his mother is deceased. He indicated that his father is deceased. He indicated that the status of his neg hx is unknown.   SOCIAL HISTORY: He  reports that he has been smoking cigarettes.  He has a 5.00 pack-year smoking history. he has never used smokeless tobacco. He reports that he drinks about 21.6 oz of alcohol per week. He reports that he does not use drugs.  REVIEW OF SYSTEMS:   Positive for abdominal pain, diarrhea followed by constipation Mild dyspnea, denies chest pain. Complains of thirst  neg for any significant sore throat, dysphagia, itching, sneezing, nasal congestion or excess/ purulent  secretions, fever, chills, sweats, unintended wt loss, pleuritic or exertional cp, hempoptysis, orthopnea pnd or change in chronic leg swelling.   Also denies presyncope, palpitations, heartburn, abdominal pain, nausea, vomiting,  or change in bowel or urinary habits, dysuria,hematuria, rash, arthralgias, visual complaints, headache, numbness weakness or ataxia.    SUBJECTIVE:    VITAL SIGNS: BP 109/69   Pulse (!) 110   Temp 98.2 F (36.8 C) (Rectal)   Resp (!) 26   Ht 6' (1.829 m)   Wt 213 lb (96.6 kg)   SpO2 96%   BMI 28.89 kg/m   HEMODYNAMICS:    VENTILATOR SETTINGS:    INTAKE / OUTPUT: No intake/output data recorded.  PHYSICAL EXAMINATION: Gen. Pleasant, elderly  well-nourished, in no distress, depressed affect ENT -dry mucosa, no post nasal drip Neck: No JVD, no thyromegaly, no carotid bruits Lungs: no use of accessory muscles, no dullness to percussion, decreased bilateral without rales or rhonchi  Cardiovascular: Rhythm regular, heart sounds  normal, no murmurs or gallops, 1+ peripheral edema Abdomen: soft and non-tender, no hepatosplenomegaly, BS normal. Musculoskeletal: Warm extremities, no deformities,  no cyanosis or clubbing Neuro:  alert, non focal   LABS:  BMET Recent Labs  Lab 12/24/17 12/25/17 1026  NA 136* 135  K 4.2 4.4  CL  --  100*  CO2  --  22  BUN 38* 37*  CREATININE 1.3 1.43*  GLUCOSE  --  343*    Electrolytes Recent Labs  Lab 12/25/17 1026  CALCIUM 8.6*    CBC Recent Labs  Lab 12/24/17 12/25/17 1026  WBC 21.7 16.3*  HGB 11.7* 13.0  HCT 36* 39.7  PLT 296 356    Coag's Recent Labs  Lab 12/25/17 1121  INR 1.21    Sepsis Markers Recent Labs  Lab 12/25/17 1029  LATICACIDVEN 4.35*    ABG No results for input(s): PHART, PCO2ART, PO2ART in the last 168 hours.  Liver Enzymes Recent Labs  Lab 12/25/17 1026  AST 113*  ALT 82*  ALKPHOS 108  BILITOT 0.8  ALBUMIN 2.1*    Cardiac Enzymes Recent Labs  Lab  12/25/17 1122  TROPONINI 0.03*    Glucose No results for input(s): GLUCAP in the last 168 hours.  Imaging Dg Chest 2 View  Result Date: 12/25/2017 CLINICAL DATA:  History pneumonia.  Cough and congestion. EXAM: CHEST - 2 VIEW COMPARISON:  11/14/2017. FINDINGS: Cardiomegaly with pulmonary venous congestion again noted. Bilateral interstitial prominence noted. Findings suggest CHF. Pneumonitis cannot be excluded. Small bilateral pleural effusions cannot be excluded. No pneumothorax. IMPRESSION: Cardiomegaly with pulmonary venous congestion bilateral interstitial prominence suggesting mild CHF. Small bilateral pleural effusions may be present. Electronically Signed   By: Marcello Moores  Register   On: 12/25/2017 10:38   Ct Head Wo Contrast  Result Date: 12/25/2017 CLINICAL DATA:  84 year old male with altered mental status. Recent pneumonia. EXAM: CT HEAD WITHOUT CONTRAST TECHNIQUE: Contiguous axial images were obtained from the base of the skull through the vertex without intravenous contrast. COMPARISON:  Head CT without contrast 11/15/2017 and earlier. FINDINGS: Brain: Stable cerebral volume. Stable ventricle size and configuration since 2013. Small chronic lacunar infarct in the inferior left cerebellum is stable. Patchy supratentorial cerebral white matter hypodensity is stable. No midline shift, mass effect, evidence of mass lesion, intracranial hemorrhage or evidence of cortically based acute infarction. Vascular: Calcified atherosclerosis at the skull base. No suspicious intracranial vascular hyperdensity. Skull: No acute osseous abnormality identified. Sinuses/Orbits: Improved sinus aeration since January. Residual sphenoid sinus mucosal thickening. Bilateral tympanic cavities and mastoids are clear. Other: No acute orbit or scalp soft tissue findings. IMPRESSION: 1. No acute intracranial abnormality. Stable non contrast CT appearance of the brain since January. 2. Improved sinus aeration since January  with residual sphenoid sinus disease. Electronically Signed   By: Genevie Ann M.D.   On: 12/25/2017 12:33   Ct Abdomen Pelvis W Contrast  Result Date: 12/25/2017 CLINICAL DATA:  Abdominal distention. EXAM: CT ABDOMEN AND PELVIS WITH CONTRAST TECHNIQUE: Multidetector CT imaging of the abdomen and pelvis was performed using the standard protocol following bolus administration of intravenous contrast. CONTRAST:  15mL ISOVUE-300 IOPAMIDOL (ISOVUE-300) INJECTION 61% COMPARISON:  02/23/2012 FINDINGS: Lower chest: Airspace opacities are identified within both lower lobes medially. Small right pleural effusion identified. Hepatobiliary: Low-attenuation fluid foci within the medial segment of left lobe of liver appear contiguous with the gallbladder, image 14/2. These measure up to 1.7 cm and are suspicious for small liver abscesses. Moderate distension of the gallbladder. There may be a stone within the gallbladder measuring approximately 2 cm. No biliary dilatation. Pancreas: Unremarkable. No pancreatic ductal dilatation  or surrounding inflammatory changes. Spleen: Normal in size without focal abnormality. Adrenals/Urinary Tract: The adrenal glands appear normal. Unremarkable appearance of the kidneys. The urinary bladder is negative. Stomach/Bowel: The stomach is normal. No abnormal dilatation of the small bowel loops. The appendix is visualized and appears normal. No pathologic dilatation of the colon. Vascular/Lymphatic: Aortic atherosclerosis noted. No aneurysm. No upper abdominal adenopathy identified. No pelvic or inguinal adenopathy. Reproductive: Prostate is unremarkable. Other: Peripherally enhancing fluid collection along the undersurface of the right lobe of liver measures 5.2 by 2.6 by 4.7 cm. Suspicious for subdiaphragmatic abscess. A small volume of perisplenic fluid is noted within the left upper quadrant of the abdomen between the spleen and left hemidiaphragm. Musculoskeletal: The bones appear diffusely  osteopenic. There is degenerative disc disease identified within the lumbar spine. There is a compression fracture involving the L1 vertebra which is new from CT dated 02/23/2012. IMPRESSION: 1. Several low-attenuation foci within the medial segment of left lobe of liver appear contiguous with the gallbladder and are suspicious for small liver abscesses. 2. Given the suspicious changes within the medial segment of left lobe of liver cannot rule out gallbladder inflammation/infection. Less likely would be a gallbladder neoplasm which is invading the adjacent liver. As a next step, further evaluation with right upper quadrant gallbladder ultrasound is advised to further evaluate acute cholecystitis versus mass. 3. Sub diaphragmatic fluid collection is identified and is suspicious for abscess. 4. Small perisplenic fluid collection. Indeterminate. Cannot rule out abscess. 5. Bilateral lower lobe airspace densities compatible with pneumonia. 6.  Aortic Atherosclerosis (ICD10-I70.0). 7. These results were called by telephone at the time of interpretation on 12/25/2017 at 12:51 pm to Dr. Vanita Panda, who verbally acknowledged these results. Electronically Signed   By: Kerby Moors M.D.   On: 12/25/2017 12:52   US Abdomen Limited Ruq  Result Date: 12/25/2017 CLINICAL DATA:  Abnormal appearance of gallbladder on CT EXAM: ULTRASOUND ABDOMEN LIMITED RIGHT UPPER QUADRANT COMPARISON:  CT abdomen and pelvis December 25, 2017 FINDINGS: Gallbladder: Sludge is seen throughout the gallbladder. No well-defined gallstones are delineated by ultrasound. Gallbladder wall appears thickened. No pericholecystic fluid evident. No sonographic Murphy sign noted by sonographer. Common bile duct: Diameter: 3 mm. No intrahepatic or extrahepatic biliary duct dilatation. Liver: Liver not completely visualized due to shadowing from apparent gas. Liver echogenicity appears overall increased. No focal liver lesions are evident on this study. Portal vein  is patent on color Doppler imaging with normal direction of blood flow towards the liver. IMPRESSION: 1. Gallbladder wall appears mildly thickened with sludge in the gallbladder. No gallstones evident. A degree of acalculus cholecystitis is questioned. In this regard, it may be prudent to consider nuclear medicine hepatobiliary imaging study to assess for cystic duct patency. 2. Portions of liver not well visualized due to apparent gas. Findings on CT in the periphery of the liver are not appreciable on this sonographic evaluation. The overall echogenicity of the visualized portions of liver appear increased which may indicate a degree of underlying hepatic steatosis. The sensitivity of ultrasound for detection of focal liver lesions is diminished significantly in this circumstance. Electronically Signed   By: Lowella Grip III M.D.   On: 12/25/2017 13:33     STUDIES:  CT abd 3/12 >> bilateral lower lobe airspace disease, subdiaphragmatic collection, liver abscesses Ultrasound of abdomen 3/12 thickened gallbladder?  Acalculous cholecystitis Head CT 3/12>> neg  CULTURES: Blood 3/12>> Urine 3/12 >>  ANTIBIOTICS: Meropenem 3/12>> Linezolid 3/12>>  SIGNIFICANT EVENTS:   LINES/TUBES:  DISCUSSION: Nursing home resident on multiple antibiotics recently treated for pneumonia, transferred for persistent tachycardia, found to be in septic shock with lactic acidosis, responded to fluids, repeat lactate pending. Sources include subdiaphragmatic/liver abscess perhaps from a calculus cholecystitis or colonic source, possible HCAP  ASSESSMENT / PLAN:  PULMONARY A: Underlying COPD Concern for bibasilar pneumonia versus fluid overload versus atelectasis P:   Duo nebs every 6. Monitor oxygenation  CARDIOVASCULAR A:  Hypertension Lactic acidosis/septic shock P:  Hold meds Repeat lactate  RENAL A:   AKI , related to sepsis P:   Monitor electrolytes  GASTROINTESTINAL A:   No  issues P:     HEMATOLOGIC A:   No issues P:  Subcu heparin  INFECTIOUS A:   Subdiaphragmatic abscess/liver abscess Concern for a calculus cholecystitis P:   Empiric meropenem and linezolid Pending culture. IR consult for drainage May need HIDA scan to define the gallbladder better  ENDOCRINE A:   Uncontrolled diabetes type 2 P:   SSI resistant scale Lantus 30 units-half home dose  NEUROLOGIC A:   Severe deconditioning P:     FAMILY  - Updates: Daughters at bedside .  He has been in declining health and bedbound, definitely not a surgical candidate.  Per nursing home notes, patient is out of facility DNR   - Inter-disciplinary family meet or Palliative Care meeting due by:  day [place] MD. [facility] Pager [phone] If no response call [phone]    12/25/2017, 3:14 PM

## 2017-12-25 NOTE — Progress Notes (Signed)
Request received for drainage of right upper abdominal fluid collection in patient.  Imaging studies have been reviewed by Dr. Pascal Lux.  Will await results of HIDA scan before proceeding with drainage in case cholecystostomy needed at same time.   Would hold heparin injections until after above procedure, tent planned for 3/13.

## 2017-12-25 NOTE — ED Notes (Signed)
Main lab drew lactic acid plasma vs istat lactic. Informed hospitalist, changed order so that the plasma lactic acid can be ran to recheck lactic acid.

## 2017-12-25 NOTE — ED Notes (Signed)
Claiborne Billings RN notified phlebotomy at bedside to draw lab add ons, bolus paused for blood draw. Notified current order for 3,000 ml bolus ONLY to give 2000 ml bolus.

## 2017-12-25 NOTE — Consult Note (Signed)
Reason for Consult: Liver abscess/possible cholecystitis Referring Physician: Dr. Kirby Funk  Preston Moore is an 84 y.o. male.   HPI: This is a 84 year old male who lives in a skilled nursing facility.  He has had a hip fracture, and had a right and trochanter nailing in 2018.  He is essentially never recovered fully from this and his mobilization has been minimal.  He is now spends most of his time in bed.  He has confusion secondary to his age, but has never been diagnosed with dementia, or Alzheimer's of any kind.   He was hospitalized about 1 month ago for pneumonia secondary to flu.  His family reports he was having difficulties and was diagnosed with pneumonia again last Thursday, 12/20/17.  He was placed on antibiotics again.  Despite IV antibiotics being used within the skilled nursing facility he was not improving.  He was on nasal cannula to keep his saturations up and he was transferred to the ED for evaluation and treatment.  I cannot get any history out of him but his daughters are present and says he was failing to progress.  He is also having some discomfort in the right upper quadrant;  he was transported to the emergency department for further treatment. On arrival to the emergency department the only thing he was able to report was some tenderness in his abdomen, and that he was thirsty.  Workup in the ED shows he is afebrile with temperature 98.2 rectally.  He was tachycardic with heart rates in the 120s-130s on arrival.  Blood pressure was 101/79 and later repeat was 193/172.  O2 saturations were between 91 and 96% on a 3 L nasal cannula. Labs shows pH 7.37, PCO2 of 40, I do not see a PO2.  Bicarbonate was 23, acid base was 1.5.  Sodium was 136 potassium was 4.4 glucose was 343 creatinine 1.43 BUN was 37.  Albumin was low total protein normal at 7.2.  AST 113 ALT 82 BUN was 0.8.  BNP was 283, troponin was 0.03 lactate was 4.35;  CBC shows a white count of 16.3, hemoglobin of 13  hematocrit 39.7 platelets were 356,000.  chest x-ray shows cardiomegaly with pulmonary venous congestion and bilateral interstitial prominence suggesting a mild CHF.  He has small bilateral pleural effusions.  CT of the abdomen and pelvis with contrast was then obtained.This this showed a fluid collection  Contiguous with the gallbladder.  There is a question of some small liver abscesses.  Moderate distention of the gallbladder and there may be a stone within the gallbladder measuring 2 cm.  No biliary dilatation the pancreas was unremarkable.  Lower lung airspace is shows opacities identified in both lower lobes medially and there is a small right pleural effusion identified.  CT of the head shows no acute intracranial abnormalities has a stable noncontrast CT with improved aeration of the sinuses since January.  Abdominal ultrasound shows gallbladder wall appears mildly thickened with sludge in the gallbladder no gallstones were evident there is a degree of a calculus cholecystitis question based on this study.  Portions of the liver was not well visualized due to apparent gas findings on CT in the periphery of liver were not appreciable on the ultrasound.  There is also a degree of underlying hepatic steatosis by ultrasound.    Past Medical History:  Diagnosis Date  . Atrial flutter ([place])   . B12 deficiency 02/22/2012  . Closed right hip fracture, initial encounter ([place]) 04/06/2017  . Diabetes  mellitus   . Hyperlipidemia   . Hypertension   . Ileus ([place]) 02/29/2012  . Malnutrition ([place]) 02/29/2012  . Neuropathy   . Non compliance with medical treatment 02/29/2012  . Pancreatitis 02/22/2012  . SBO (small bowel obstruction) ([place]) 02/23/2012  . Syncope and collapse     Past Surgical History:  Procedure Laterality Date  . BACK SURGERY    . COMPRESSION HIP SCREW Right 04/07/2017   Procedure: RIGHT HIP INTRATROCHANTERIC NAILING;  Surgeon: [name], MD;  Location: [facility];  Service: Orthopedics;   Laterality: Right;  . LAMINECTOMY  1965    Family History  Problem Relation Age of Onset  . Hypertension Neg Hx   . Coronary artery disease Neg Hx   . Diabetes Neg Hx     Social History:  reports that he has been smoking cigarettes.  He has a 5.00 pack-year smoking history. he has never used smokeless tobacco. He reports that he drinks about 21.6 oz of alcohol per week. He reports that he does not use drugs.  Allergies: No Known Allergies  Medications:  Prior to Admission:  (Not in a hospital admission) Scheduled: . heparin  5,000 Units Subcutaneous Q8H  . insulin aspart  0-20 Units Subcutaneous Q4H  . insulin glargine  30 Units Subcutaneous QHS  . iopamidol      . sodium chloride       Continuous: . sodium chloride    . linezolid (ZYVOX) IV    . meropenem (MERREM) IV     Anti-infectives (From admission, onward)   Start     Dose/Rate Route Frequency Ordered Stop   12/25/17 2200  linezolid (ZYVOX) IVPB 600 mg     600 mg 300 mL/hr over 60 Minutes Intravenous Every 12 hours 12/25/17 1133     12/25/17 2200  meropenem (MERREM) 1 g in sodium chloride 0.9 % 100 mL IVPB     1 g 200 mL/hr over 30 Minutes Intravenous Every 12 hours 12/25/17 1133     12/25/17 1115  linezolid (ZYVOX) IVPB 600 mg     600 mg 300 mL/hr over 60 Minutes Intravenous STAT 12/25/17 1108 12/25/17 1346   12/25/17 1115  meropenem (MERREM) 1 g in sodium chloride 0.9 % 100 mL IVPB     1 g 200 mL/hr over 30 Minutes Intravenous STAT 12/25/17 1108 12/25/17 1244      Results for orders placed or performed during the hospital encounter of 12/25/17 (from the past 48 hour(s))  Comprehensive metabolic panel     Status: Abnormal   Collection Time: 12/25/17 10:26 AM  Result Value Ref Range   Sodium 135 135 - 145 mmol/L   Potassium 4.4 3.5 - 5.1 mmol/L   Chloride 100 (L) 101 - 111 mmol/L   CO2 22 22 - 32 mmol/L   Glucose, Bld 343 (H) 65 - 99 mg/dL   BUN 37 (H) 6 - 20 mg/dL   Creatinine, Ser 1.43 (H) 0.61 - 1.24  mg/dL   Calcium 8.6 (L) 8.9 - 10.3 mg/dL   Total Protein 7.2 6.5 - 8.1 g/dL   Albumin 2.1 (L) 3.5 - 5.0 g/dL   AST 113 (H) 15 - 41 U/L   ALT 82 (H) 17 - 63 U/L   Alkaline Phosphatase 108 38 - 126 U/L   Total Bilirubin 0.8 0.3 - 1.2 mg/dL   GFR calc non Af Amer 44 (L) >60 mL/min   GFR calc Af Amer 51 (L) >60 mL/min    Comment: (NOTE) The  eGFR has been calculated using the CKD EPI equation. This calculation has not been validated in all clinical situations. eGFR's persistently <60 mL/min signify possible Chronic Kidney Disease.    Anion gap 13 5 - 15    Comment: Performed at [facility], [place] [address], [place], [place] [address]  CBC with Differential     Status: Abnormal   Collection Time: 12/25/17 10:26 AM  Result Value Ref Range   WBC 16.3 (H) 4.0 - 10.5 K/uL   RBC 4.54 4.22 - 5.81 MIL/uL   Hemoglobin 13.0 13.0 - 17.0 g/dL   HCT 39.7 39.0 - 52.0 %   MCV 87.4 78.0 - 100.0 fL   MCH 28.6 26.0 - 34.0 pg   MCHC 32.7 30.0 - 36.0 g/dL   RDW 15.4 11.5 - 15.5 %   Platelets 356 150 - 400 K/uL   Neutrophils Relative % 91 %   Neutro Abs 14.8 (H) 1.7 - 7.7 K/uL   Lymphocytes Relative 6 %   Lymphs Abs 1.0 0.7 - 4.0 K/uL   Monocytes Relative 3 %   Monocytes Absolute 0.6 0.1 - 1.0 K/uL   Eosinophils Relative 0 %   Eosinophils Absolute 0.0 0.0 - 0.7 K/uL   Basophils Relative 0 %   Basophils Absolute 0.0 0.0 - 0.1 K/uL    Comment: Performed at [facility], [place] [address] [place], [place] [address]  I-Stat CG4 Lactic Acid, ED     Status: Abnormal   Collection Time: 12/25/17 10:29 AM  Result Value Ref Range   Lactic Acid, Venous 4.35 (HH) 0.5 - 1.9 mmol/L   Comment NOTIFIED PHYSICIAN   Blood gas, venous     Status: Abnormal   Collection Time: 12/25/17 11:15 AM  Result Value Ref Range   pH, Ven 7.375 7.250 - 7.430   pCO2, Ven 40.3 (L) 44.0 - 60.0 mmHg   pO2, Ven  32.0 - 45.0 mmHg    CRITICAL RESULT CALLED TO, READ BACK BY AND VERIFIED WITH:     Comment: BELOW REPORTABLE RANGE. MD LOCKWOOD AT 1126 BY DEE WALTERS RRT ON 12/25/17   Bicarbonate 23.0 20.0 - 28.0 mmol/L   Acid-base deficit 1.5 0.0 - 2.0 mmol/L   O2 Saturation 35.4 %   Patient temperature 98.6    Collection site DRAWN BY RN    Drawn by DRAWN BY RN    Sample type VEIN     Comment: Performed at [facility], [place] [address], [place], [place] [address]  Protime-INR     Status: None   Collection Time: 12/25/17 11:21 AM  Result Value Ref Range   Prothrombin Time 15.2 11.4 - 15.2 seconds   INR 1.21     Comment: Performed at [facility], [place] [address] [place], [place] [address]  Brain natriuretic peptide     Status: Abnormal   Collection Time: 12/25/17 11:21 AM  Result Value Ref Range   B Natriuretic Peptide 283.8 (H) 0.0 - 100.0 pg/mL    Comment: Performed at [facility], [place] [address] [place], [place] [address]  Troponin I     Status: Abnormal   Collection Time: 12/25/17 11:22 AM  Result Value Ref Range   Troponin I 0.03 (HH) <0.03 ng/mL    Comment: CRITICAL RESULT CALLED TO, READ BACK BY AND VERIFIED WITH: S.WEST RN [phone] [id number] A.QUIZON Performed at [place] [address] [place], [place] [address]     Dg Chest  2 View  Result Date: 12/25/2017 CLINICAL DATA:  History pneumonia.  Cough and congestion. EXAM: CHEST - 2 VIEW COMPARISON:  11/14/2017. FINDINGS: Cardiomegaly with pulmonary venous congestion again noted. Bilateral interstitial prominence noted. Findings suggest CHF. Pneumonitis cannot be excluded. Small bilateral pleural effusions cannot be excluded. No pneumothorax. IMPRESSION: Cardiomegaly with pulmonary venous congestion bilateral interstitial prominence suggesting mild CHF. Small bilateral pleural effusions may be present. Electronically Signed   By: Marcello Moores  Register   On: 12/25/2017 10:38   Ct Head Wo Contrast  Result Date: 12/25/2017 CLINICAL DATA:  84 year old  male with altered mental status. Recent pneumonia. EXAM: CT HEAD WITHOUT CONTRAST TECHNIQUE: Contiguous axial images were obtained from the base of the skull through the vertex without intravenous contrast. COMPARISON:  Head CT without contrast 11/15/2017 and earlier. FINDINGS: Brain: Stable cerebral volume. Stable ventricle size and configuration since 2013. Small chronic lacunar infarct in the inferior left cerebellum is stable. Patchy supratentorial cerebral white matter hypodensity is stable. No midline shift, mass effect, evidence of mass lesion, intracranial hemorrhage or evidence of cortically based acute infarction. Vascular: Calcified atherosclerosis at the skull base. No suspicious intracranial vascular hyperdensity. Skull: No acute osseous abnormality identified. Sinuses/Orbits: Improved sinus aeration since January. Residual sphenoid sinus mucosal thickening. Bilateral tympanic cavities and mastoids are clear. Other: No acute orbit or scalp soft tissue findings. IMPRESSION: 1. No acute intracranial abnormality. Stable non contrast CT appearance of the brain since January. 2. Improved sinus aeration since January with residual sphenoid sinus disease. Electronically Signed   By: Genevie Ann M.D.   On: 12/25/2017 12:33   Ct Abdomen Pelvis W Contrast  Result Date: 12/25/2017 CLINICAL DATA:  Abdominal distention. EXAM: CT ABDOMEN AND PELVIS WITH CONTRAST TECHNIQUE: Multidetector CT imaging of the abdomen and pelvis was performed using the standard protocol following bolus administration of intravenous contrast. CONTRAST:  35m ISOVUE-300 IOPAMIDOL (ISOVUE-300) INJECTION 61% COMPARISON:  02/23/2012 FINDINGS: Lower chest: Airspace opacities are identified within both lower lobes medially. Small right pleural effusion identified. Hepatobiliary: Low-attenuation fluid foci within the medial segment of left lobe of liver appear contiguous with the gallbladder, image 14/2. These measure up to 1.7 cm and are  suspicious for small liver abscesses. Moderate distension of the gallbladder. There may be a stone within the gallbladder measuring approximately 2 cm. No biliary dilatation. Pancreas: Unremarkable. No pancreatic ductal dilatation or surrounding inflammatory changes. Spleen: Normal in size without focal abnormality. Adrenals/Urinary Tract: The adrenal glands appear normal. Unremarkable appearance of the kidneys. The urinary bladder is negative. Stomach/Bowel: The stomach is normal. No abnormal dilatation of the small bowel loops. The appendix is visualized and appears normal. No pathologic dilatation of the colon. Vascular/Lymphatic: Aortic atherosclerosis noted. No aneurysm. No upper abdominal adenopathy identified. No pelvic or inguinal adenopathy. Reproductive: Prostate is unremarkable. Other: Peripherally enhancing fluid collection along the undersurface of the right lobe of liver measures 5.2 by 2.6 by 4.7 cm. Suspicious for subdiaphragmatic abscess. A small volume of perisplenic fluid is noted within the left upper quadrant of the abdomen between the spleen and left hemidiaphragm. Musculoskeletal: The bones appear diffusely osteopenic. There is degenerative disc disease identified within the lumbar spine. There is a compression fracture involving the L1 vertebra which is new from CT dated 02/23/2012. IMPRESSION: 1. Several low-attenuation foci within the medial segment of left lobe of liver appear contiguous with the gallbladder and are suspicious for small liver abscesses. 2. Given the suspicious changes within the medial segment of left lobe of liver cannot  rule out gallbladder inflammation/infection. Less likely would be a gallbladder neoplasm which is invading the adjacent liver. As a next step, further evaluation with right upper quadrant gallbladder ultrasound is advised to further evaluate acute cholecystitis versus mass. 3. Sub diaphragmatic fluid collection is identified and is suspicious for abscess.  4. Small perisplenic fluid collection. Indeterminate. Cannot rule out abscess. 5. Bilateral lower lobe airspace densities compatible with pneumonia. 6.  Aortic Atherosclerosis (ICD10-I70.0). 7. These results were called by telephone at the time of interpretation on 12/25/2017 at 12:51 pm to Dr. Vanita Panda, who verbally acknowledged these results. Electronically Signed   By: Kerby Moors M.D.   On: 12/25/2017 12:52   US Abdomen Limited Ruq  Result Date: 12/25/2017 CLINICAL DATA:  Abnormal appearance of gallbladder on CT EXAM: ULTRASOUND ABDOMEN LIMITED RIGHT UPPER QUADRANT COMPARISON:  CT abdomen and pelvis December 25, 2017 FINDINGS: Gallbladder: Sludge is seen throughout the gallbladder. No well-defined gallstones are delineated by ultrasound. Gallbladder wall appears thickened. No pericholecystic fluid evident. No sonographic Murphy sign noted by sonographer. Common bile duct: Diameter: 3 mm. No intrahepatic or extrahepatic biliary duct dilatation. Liver: Liver not completely visualized due to shadowing from apparent gas. Liver echogenicity appears overall increased. No focal liver lesions are evident on this study. Portal vein is patent on color Doppler imaging with normal direction of blood flow towards the liver. IMPRESSION: 1. Gallbladder wall appears mildly thickened with sludge in the gallbladder. No gallstones evident. A degree of acalculus cholecystitis is questioned. In this regard, it may be prudent to consider nuclear medicine hepatobiliary imaging study to assess for cystic duct patency. 2. Portions of liver not well visualized due to apparent gas. Findings on CT in the periphery of the liver are not appreciable on this sonographic evaluation. The overall echogenicity of the visualized portions of liver appear increased which may indicate a degree of underlying hepatic steatosis. The sensitivity of ultrasound for detection of focal liver lesions is diminished significantly in this circumstance.  Electronically Signed   By: Lowella Grip III M.D.   On: 12/25/2017 13:33    Review of Systems  Unable to perform ROS: Mental status change   Blood pressure (!) 93/56, pulse (!) 110, temperature 98.2 F (36.8 C), temperature source Rectal, resp. rate (!) 26, height 6' (1.829 m), weight 96.6 kg (213 lb), SpO2 94 %. Physical Exam  Constitutional:  The history got we got from the daughters.  Patient could not contribute anything to his history.  He cannot tell me the year or the president.  He thought he was at [facility].  Body mass index is 28.89 kg/m. His daughters say he was transferred to the skilled nursing because of decreased mobility after his hip fracture now he practically stays in bed all the time.  HENT:  Head: Normocephalic and atraumatic.  Mouth/Throat: No oropharyngeal exudate.  Tongue is dry.  Neck: Normal range of motion. Neck supple. No JVD present. No tracheal deviation present. No thyromegaly present.  GI: He exhibits distension (Slightly distended.) and mass. There is tenderness. There is no rebound and no guarding.  Bowel sounds are hypoactive.  Musculoskeletal: He exhibits edema (Trace no distal pulses on the right.). He exhibits no tenderness.  No distal pulses on the right.  Neurological: He is alert. A cranial nerve deficit is present.  Skin: Skin is warm and dry. No rash noted. No erythema. No pallor.  He has no pulses distal to the femoral on the right.  He has a nonhealing ulcer  right medial foot this is a dry nonhealing eschar.  It is about 3 cm in diameter.  Psychiatric: He has a normal mood and affect. His behavior is normal. Judgment and thought content normal.    Assessment/Plan: Sepsis Lactic acidosis Liver abscess-subdiaphragmatic fluid collection/possible acute cholecystitis Bilateral pneumonia/atelectasis Acute kidney disease Systolic heart failure with an EF 35%/Mild congestive failure Possible recurrent pneumonia Altered mental  status-acute on chronic metabolic encephalopathy.  Failure to thrive/significantly decreased mobility PAD with right medial heel ulcer   Plan: Patient was seen and evaluated by Dr. Marlou Starks.  It is his the recommendation that the patient undergo fluid resuscitation, antibiotic therapy.  Once he is stabilized we can evaluate the liver abscess.  HIDA scan to look for cholecystitis once he is more stable.  IR can evaluate and possibly aspirate or drain the subdiaphragmatic liver abscess.  He is being admitted by critical care medicine for fluid resuscitation/sepsis protocol.  We will follow with you.  After IR evaluation and fluid resuscitation we can consider a HIDA scan if necessary.  At this point with his significant debility, we would recommend an IR drain if that is necessary.  Tonishia Steffy 12/25/2017, 2:51 PM

## 2017-12-25 NOTE — ED Notes (Addendum)
ED TO INPATIENT HANDOFF REPORT  Name/Age/Gender Preston Moore 84 y.o. male  Code Status    Code Status Orders  (From admission, onward)        Start     Ordered   12/25/17 1512  Full code  Continuous     12/25/17 1512    Code Status History    Date Active Date Inactive Code Status Order ID Comments User Context   11/15/2017 03:59 11/17/2017 22:32 Full Code [id number]  Rise Patience, MD Inpatient   04/06/2017 17:09 04/10/2017 18:07 Full Code [id number]  Rosita Fire, MD ED   02/18/2012 02:32 02/22/2012 18:24 Full Code [id number]  Theressa Millard, MD ED    Advance Directive Documentation     Most Recent Value  Type of Advance Directive  Out of facility DNR (pink MOST or yellow form)  Pre-existing out of facility DNR order (yellow form or pink MOST form)  Yellow form placed in chart (order not valid for inpatient use)  "MOST" Form in Place?  No data      Home/SNF/Other Skilled nursing facility [place] [facility] Complaint Pneumonia  Level of Care/Admitting Diagnosis ED Disposition    ED Disposition Condition [place]: [facility] [[id number]]  Level of Care: Stepdown [14]  Admit to SDU based on following criteria: Hemodynamic compromise or significant risk of instability:  Patient requiring short term acute titration and management of vasoactive drips, and invasive monitoring (i.e., CVP and Arterial line).  Diagnosis: Septic shock [facility]) [[id number]]  Admitting Physician: ALVA, [place]  Attending Physician: Kara Mead V [[id number]]  Estimated length of stay: past midnight tomorrow  Certification:: I certify this patient will need inpatient services for at least 2 midnights  PT Class (Do Not Modify): Inpatient [101]  PT Acc Code (Do Not Modify): Private [1]       Medical History Past Medical History:  Diagnosis Date  . Atrial flutter ([place])   . B12 deficiency 02/22/2012  . Closed right hip fracture,  initial encounter ([place]) 04/06/2017  . Diabetes mellitus   . Hyperlipidemia   . Hypertension   . Ileus ([place]) 02/29/2012  . Malnutrition ([place]) 02/29/2012  . Neuropathy   . Non compliance with medical treatment 02/29/2012  . Pancreatitis 02/22/2012  . SBO (small bowel obstruction) ([place]) 02/23/2012  . Syncope and collapse     Allergies No Known Allergies  IV Location/Drains/Wounds Patient Lines/Drains/Airways Status   Active Line/Drains/Airways    Name:   Placement date:   Placement time:   Site:   Days:   Peripheral IV 12/25/17 Right Hand   12/25/17    1010    Hand   less than 1   PICC Single Lumen 12/25/17 Left   12/25/17    1791    -   less than 1   Urethral Catheter Rachel Covil, NT Latex 16 Fr.   12/25/17    1530    Latex   less than 1   Incision (Closed) 04/07/17 Thigh Right   04/07/17    1447     262   Pressure Injury 11/15/17 Stage II -  Partial thickness loss of dermis presenting as a shallow open ulcer with a red, pink wound bed without slough.   11/15/17    0100     40   Wound / Incision (Open or Dehisced) 11/15/17 Heel Right;Left   11/15/17    0100    Heel  40          Labs/Imaging Results for orders placed or performed during the hospital encounter of 12/25/17 (from the past 48 hour(s))  Comprehensive metabolic panel     Status: Abnormal   Collection Time: 12/25/17 10:26 AM  Result Value Ref Range   Sodium 135 135 - 145 mmol/L   Potassium 4.4 3.5 - 5.1 mmol/L   Chloride 100 (L) 101 - 111 mmol/L   CO2 22 22 - 32 mmol/L   Glucose, Bld 343 (H) 65 - 99 mg/dL   BUN 37 (H) 6 - 20 mg/dL   Creatinine, Ser 1.43 (H) 0.61 - 1.24 mg/dL   Calcium 8.6 (L) 8.9 - 10.3 mg/dL   Total Protein 7.2 6.5 - 8.1 g/dL   Albumin 2.1 (L) 3.5 - 5.0 g/dL   AST 113 (H) 15 - 41 U/L   ALT 82 (H) 17 - 63 U/L   Alkaline Phosphatase 108 38 - 126 U/L   Total Bilirubin 0.8 0.3 - 1.2 mg/dL   GFR calc non Af Amer 44 (L) >60 mL/min   GFR calc Af Amer 51 (L) >60 mL/min    Comment: (NOTE) The eGFR has  been calculated using the CKD EPI equation. This calculation has not been validated in all clinical situations. eGFR's persistently <60 mL/min signify possible Chronic Kidney Disease.    Anion gap 13 5 - 15    Comment: Performed at [facility], [place] [address] [place], [place] [address]  CBC with Differential     Status: Abnormal   Collection Time: 12/25/17 10:26 AM  Result Value Ref Range   WBC 16.3 (H) 4.0 - 10.5 K/uL   RBC 4.54 4.22 - 5.81 MIL/uL   Hemoglobin 13.0 13.0 - 17.0 g/dL   HCT 39.7 39.0 - 52.0 %   MCV 87.4 78.0 - 100.0 fL   MCH 28.6 26.0 - 34.0 pg   MCHC 32.7 30.0 - 36.0 g/dL   RDW 15.4 11.5 - 15.5 %   Platelets 356 150 - 400 K/uL   Neutrophils Relative % 91 %   Neutro Abs 14.8 (H) 1.7 - 7.7 K/uL   Lymphocytes Relative 6 %   Lymphs Abs 1.0 0.7 - 4.0 K/uL   Monocytes Relative 3 %   Monocytes Absolute 0.6 0.1 - 1.0 K/uL   Eosinophils Relative 0 %   Eosinophils Absolute 0.0 0.0 - 0.7 K/uL   Basophils Relative 0 %   Basophils Absolute 0.0 0.0 - 0.1 K/uL    Comment: Performed at [facility], [place] [address], [place], [place] [address]  I-Stat CG4 Lactic Acid, ED     Status: Abnormal   Collection Time: 12/25/17 10:29 AM  Result Value Ref Range   Lactic Acid, Venous 4.35 (HH) 0.5 - 1.9 mmol/L   Comment NOTIFIED PHYSICIAN   Blood gas, venous     Status: Abnormal   Collection Time: 12/25/17 11:15 AM  Result Value Ref Range   pH, Ven 7.375 7.250 - 7.430   pCO2, Ven 40.3 (L) 44.0 - 60.0 mmHg   pO2, Ven  32.0 - 45.0 mmHg    CRITICAL RESULT CALLED TO, READ BACK BY AND VERIFIED WITH:    Comment: BELOW REPORTABLE RANGE. MD [place] AT 1126 BY DEE WALTERS RRT ON 12/25/17   Bicarbonate 23.0 20.0 - 28.0 mmol/L   Acid-base deficit 1.5 0.0 - 2.0 mmol/L   O2 Saturation 35.4 %   Patient temperature 98.6    Collection site DRAWN  BY RN    Drawn by DRAWN BY RN    Sample type VEIN     Comment: Performed at [place] [address], [place], [place] [address]  Protime-INR     Status: None   Collection Time: 12/25/17 11:21 AM  Result Value Ref Range   Prothrombin Time 15.2 11.4 - 15.2 seconds   INR 1.21     Comment: Performed at [facility], [place] [address], [place], [place] [address]  Brain natriuretic peptide     Status: Abnormal   Collection Time: 12/25/17 11:21 AM  Result Value Ref Range   B Natriuretic Peptide 283.8 (H) 0.0 - 100.0 pg/mL    Comment: Performed at [facility], [place] [address], [place], [place] [address]  Troponin I     Status: Abnormal   Collection Time: 12/25/17 11:22 AM  Result Value Ref Range   Troponin I 0.03 (HH) <0.03 ng/mL    Comment: CRITICAL RESULT CALLED TO, READ BACK BY AND VERIFIED WITH: S.WEST RN [id number] A.QUIZON Performed at [place] [address] [place], [place] [address]   Troponin I     Status: Abnormal   Collection Time: 12/25/17  2:46 PM  Result Value Ref Range   Troponin I 0.03 (HH) <0.03 ng/mL    Comment: CRITICAL VALUE NOTED.  VALUE IS CONSISTENT WITH PREVIOUSLY REPORTED AND CALLED VALUE. Performed at [facility], [place] [address], [place], [place] [address]   Lactic acid, plasma     Status: Abnormal   Collection Time: 12/25/17  2:50 PM  Result Value Ref Range   Lactic Acid, Venous 2.1 (HH) 0.5 - 1.9 mmol/L    Comment: CRITICAL RESULT CALLED TO, READ BACK BY AND VERIFIED WITH: STANHOPE,M. RN _0  ON 03.12.19 BY COHEN,K Performed at [facility], [place] [address], [place], [place] [address]   Urinalysis, Routine w reflex microscopic     Status: Abnormal   Collection Time: 12/25/17  3:34 PM  Result Value Ref Range   Color, Urine YELLOW YELLOW   APPearance HAZY (A) CLEAR   Specific Gravity, Urine 1.028 1.005 - 1.030   pH 5.0 5.0 - 8.0   Glucose, UA 50 (A) NEGATIVE mg/dL   Hgb urine dipstick SMALL (A) NEGATIVE   Bilirubin Urine NEGATIVE NEGATIVE    Ketones, ur 5 (A) NEGATIVE mg/dL   Protein, ur 30 (A) NEGATIVE mg/dL   Nitrite NEGATIVE NEGATIVE   Leukocytes, UA NEGATIVE NEGATIVE   RBC / HPF 0-5 0 - 5 RBC/hpf   WBC, UA 0-5 0 - 5 WBC/hpf   Bacteria, UA NONE SEEN NONE SEEN   Squamous Epithelial / LPF NONE SEEN NONE SEEN   Hyaline Casts, UA PRESENT    Granular Casts, UA PRESENT     Comment: Performed at [facility], [place] [address] [place], [place] [address]  CBG monitoring, ED     Status: Abnormal   Collection Time: 12/25/17  5:41 PM  Result Value Ref Range   Glucose-Capillary 237 (H) 65 - 99 mg/dL  CBG monitoring, ED     Status: Abnormal   Collection Time: 12/25/17  8:00 PM  Result Value Ref Range   Glucose-Capillary 191 (H) 65 - 99 mg/dL   Dg Chest 2 View  Result Date: 12/25/2017 CLINICAL DATA:  History pneumonia.  Cough and congestion. EXAM: CHEST - 2 VIEW COMPARISON:  11/14/2017. FINDINGS: Cardiomegaly with pulmonary venous congestion again noted. Bilateral interstitial  prominence noted. Findings suggest CHF. Pneumonitis cannot be excluded. Small bilateral pleural effusions cannot be excluded. No pneumothorax. IMPRESSION: Cardiomegaly with pulmonary venous congestion bilateral interstitial prominence suggesting mild CHF. Small bilateral pleural effusions may be present. Electronically Signed   By: Marcello Moores  Register   On: 12/25/2017 10:38   Ct Head Wo Contrast  Result Date: 12/25/2017 CLINICAL DATA:  84 year old male with altered mental status. Recent pneumonia. EXAM: CT HEAD WITHOUT CONTRAST TECHNIQUE: Contiguous axial images were obtained from the base of the skull through the vertex without intravenous contrast. COMPARISON:  Head CT without contrast 11/15/2017 and earlier. FINDINGS: Brain: Stable cerebral volume. Stable ventricle size and configuration since 2013. Small chronic lacunar infarct in the inferior left cerebellum is stable. Patchy supratentorial cerebral white matter hypodensity is stable. No midline  shift, mass effect, evidence of mass lesion, intracranial hemorrhage or evidence of cortically based acute infarction. Vascular: Calcified atherosclerosis at the skull base. No suspicious intracranial vascular hyperdensity. Skull: No acute osseous abnormality identified. Sinuses/Orbits: Improved sinus aeration since January. Residual sphenoid sinus mucosal thickening. Bilateral tympanic cavities and mastoids are clear. Other: No acute orbit or scalp soft tissue findings. IMPRESSION: 1. No acute intracranial abnormality. Stable non contrast CT appearance of the brain since January. 2. Improved sinus aeration since January with residual sphenoid sinus disease. Electronically Signed   By: Genevie Ann M.D.   On: 12/25/2017 12:33   Ct Abdomen Pelvis W Contrast  Result Date: 12/25/2017 CLINICAL DATA:  Abdominal distention. EXAM: CT ABDOMEN AND PELVIS WITH CONTRAST TECHNIQUE: Multidetector CT imaging of the abdomen and pelvis was performed using the standard protocol following bolus administration of intravenous contrast. CONTRAST:  93m ISOVUE-300 IOPAMIDOL (ISOVUE-300) INJECTION 61% COMPARISON:  02/23/2012 FINDINGS: Lower chest: Airspace opacities are identified within both lower lobes medially. Small right pleural effusion identified. Hepatobiliary: Low-attenuation fluid foci within the medial segment of left lobe of liver appear contiguous with the gallbladder, image 14/2. These measure up to 1.7 cm and are suspicious for small liver abscesses. Moderate distension of the gallbladder. There may be a stone within the gallbladder measuring approximately 2 cm. No biliary dilatation. Pancreas: Unremarkable. No pancreatic ductal dilatation or surrounding inflammatory changes. Spleen: Normal in size without focal abnormality. Adrenals/Urinary Tract: The adrenal glands appear normal. Unremarkable appearance of the kidneys. The urinary bladder is negative. Stomach/Bowel: The stomach is normal. No abnormal dilatation of the  small bowel loops. The appendix is visualized and appears normal. No pathologic dilatation of the colon. Vascular/Lymphatic: Aortic atherosclerosis noted. No aneurysm. No upper abdominal adenopathy identified. No pelvic or inguinal adenopathy. Reproductive: Prostate is unremarkable. Other: Peripherally enhancing fluid collection along the undersurface of the right lobe of liver measures 5.2 by 2.6 by 4.7 cm. Suspicious for subdiaphragmatic abscess. A small volume of perisplenic fluid is noted within the left upper quadrant of the abdomen between the spleen and left hemidiaphragm. Musculoskeletal: The bones appear diffusely osteopenic. There is degenerative disc disease identified within the lumbar spine. There is a compression fracture involving the L1 vertebra which is new from CT dated 02/23/2012. IMPRESSION: 1. Several low-attenuation foci within the medial segment of left lobe of liver appear contiguous with the gallbladder and are suspicious for small liver abscesses. 2. Given the suspicious changes within the medial segment of left lobe of liver cannot rule out gallbladder inflammation/infection. Less likely would be a gallbladder neoplasm which is invading the adjacent liver. As a next step, further evaluation with right upper quadrant gallbladder ultrasound is advised to further  evaluate acute cholecystitis versus mass. 3. Sub diaphragmatic fluid collection is identified and is suspicious for abscess. 4. Small perisplenic fluid collection. Indeterminate. Cannot rule out abscess. 5. Bilateral lower lobe airspace densities compatible with pneumonia. 6.  Aortic Atherosclerosis (ICD10-I70.0). 7. These results were called by telephone at the time of interpretation on 12/25/2017 at 12:51 pm to Dr. Vanita Panda, who verbally acknowledged these results. Electronically Signed   By: Kerby Moors M.D.   On: 12/25/2017 12:52   US Abdomen Limited Ruq  Result Date: 12/25/2017 CLINICAL DATA:  Abnormal appearance of  gallbladder on CT EXAM: ULTRASOUND ABDOMEN LIMITED RIGHT UPPER QUADRANT COMPARISON:  CT abdomen and pelvis December 25, 2017 FINDINGS: Gallbladder: Sludge is seen throughout the gallbladder. No well-defined gallstones are delineated by ultrasound. Gallbladder wall appears thickened. No pericholecystic fluid evident. No sonographic Murphy sign noted by sonographer. Common bile duct: Diameter: 3 mm. No intrahepatic or extrahepatic biliary duct dilatation. Liver: Liver not completely visualized due to shadowing from apparent gas. Liver echogenicity appears overall increased. No focal liver lesions are evident on this study. Portal vein is patent on color Doppler imaging with normal direction of blood flow towards the liver. IMPRESSION: 1. Gallbladder wall appears mildly thickened with sludge in the gallbladder. No gallstones evident. A degree of acalculus cholecystitis is questioned. In this regard, it may be prudent to consider nuclear medicine hepatobiliary imaging study to assess for cystic duct patency. 2. Portions of liver not well visualized due to apparent gas. Findings on CT in the periphery of the liver are not appreciable on this sonographic evaluation. The overall echogenicity of the visualized portions of liver appear increased which may indicate a degree of underlying hepatic steatosis. The sensitivity of ultrasound for detection of focal liver lesions is diminished significantly in this circumstance. Electronically Signed   By: Lowella Grip III M.D.   On: 12/25/2017 13:33    Pending Labs Unresulted Labs (From admission, onward)   Start     Ordered   12/26/17 0500  CBC  Tomorrow morning,   R     12/25/17 1512   12/26/17 6948  Basic metabolic panel  Tomorrow morning,   R     12/25/17 1512   12/26/17 0500  Magnesium  Tomorrow morning,   R     12/25/17 1512   12/26/17 0500  Phosphorus  Tomorrow morning,   R     12/25/17 1512   12/25/17 1512  Procalcitonin  Add-on,   R     12/25/17 1512    12/25/17 1047  Urine culture  STAT,   STAT     12/25/17 1053   12/25/17 1004  Culture, blood (Routine x 2)  BLOOD CULTURE X 2,   STAT     12/25/17 1003      Vitals/Pain Today's Vitals   12/25/17 1900 12/25/17 1915 12/25/17 1930 12/25/17 1945  BP: 105/65 113/68 102/62 (!) 97/57  Pulse: 95 85 87 76  Resp: (!) 27 (!) 28 (!) 26 (!) 26  Temp:      TempSrc:      SpO2: 94% 95% 94% 94%  Weight:      Height:        Isolation Precautions No active isolations  Medications Medications  iopamidol (ISOVUE-300) 61 % injection (not administered)  sodium chloride 0.9 % injection (not administered)  linezolid (ZYVOX) IVPB 600 mg (not administered)  meropenem (MERREM) 1 g in sodium chloride 0.9 % 100 mL IVPB (not administered)  0.9 %  sodium chloride  infusion (not administered)  ondansetron (ZOFRAN) injection 4 mg (not administered)  acetaminophen (TYLENOL) tablet 650 mg (not administered)  insulin glargine (LANTUS) injection 30 Units (not administered)  insulin aspart (novoLOG) injection 0-20 Units (7 Units Subcutaneous Given 12/25/17 1755)  lactobacillus rheuteri (BIOGAIA/GERBER SOOTHE) probiotic liquid (not administered)  sodium chloride 0.9 % bolus 3,000 mL (3,000 mLs Intravenous New Bag/Given 12/25/17 1050)  linezolid (ZYVOX) IVPB 600 mg (0 mg Intravenous Stopped 12/25/17 1346)  meropenem (MERREM) 1 g in sodium chloride 0.9 % 100 mL IVPB (0 g Intravenous Stopped 12/25/17 1244)  iopamidol (ISOVUE-300) 61 % injection 75 mL (75 mLs Intravenous Contrast Given 12/25/17 1205)    Mobility non-ambulatory

## 2017-12-25 NOTE — Clinical Social Work Note (Signed)
Clinical Social Work Assessment  Patient Details  Name: Preston Moore MRN: [id number] Date of Birth: 09-Sep-1934  Date of referral:  12/25/17               Reason for consult:  Facility Placement                Permission sought to share information with:  Facility Art therapist granted to share information::  Yes, Verbal Permission Granted  Name::        Agency::     Relationship::     Contact Information:     Housing/Transportation Living arrangements for the past 2 months:  [place] of Information:  Adult Children Patient Interpreter Needed:  None Criminal Activity/Legal Involvement Pertinent to Current Situation/Hospitalization:    Significant Relationships:  Adult Children Lives with:  Facility Resident Do you feel safe going back to the place where you live?  Yes Need for family participation in patient care:  Yes (Comment)  Care giving concerns:  Pt from [facility]   Facilities manager / plan:  CSW met with pt and pt's daughter and confirmed pt's daughter's plan to be discharged to SNF to live at discharge.  CSW provided active listening and validated pt's daughter's concerns that pt should return to [place].   CSW was given permission by family to complete FL-2 and send info out to [facility] facility via the hub per pt's request.  Pt has been living at [facility] 4-5 months, prior to being admitted to [facility]  Employment status:  Retired Forensic scientist:  Medicaid In [place] PT Recommendations:  No Follow Up Information / Referral to community resources:     Patient/Family's Response to care:  It is unknown to this CSW, if pt is alert and oriented.  Patient and daughter Preston Moore agreeable to plan.  Pt's 4 daughter's supportive and strongly involved in pt.'s care.  Pt.'s daughter Preston Moore pleasant and appreciated CSW intervention.    Patient/Family's Understanding of  and Emotional Response to Diagnosis, Current Treatment, and Prognosis:  Pt and family understand current prognosis and treatment.  Emotional Assessment Appearance:  Appears stated age Attitude/Demeanor/Rapport:  Unable to Assess(Pt did not speak, nor acknowledge CSW's presence) Affect (typically observed):  Unable to Assess Orientation:  (Unknown) Alcohol / Substance use:    Psych involvement (Current and /or in the community):     Discharge Needs  Concerns to be addressed:  No discharge needs identified Readmission within the last 30 days:  No Current discharge risk:  None Barriers to Discharge:  No Barriers Identified   Claudine Mouton, LCSWA 12/25/2017, 5:53 PM

## 2017-12-25 NOTE — ED Triage Notes (Signed)
Per EMS pt sent for evaluation related to pneumonia not improving post treatment. Pt uses 2 lpm [place].

## 2017-12-25 NOTE — ED Notes (Signed)
1x attempt to call report, RN in ICU didn't answer.

## 2017-12-25 NOTE — ED Notes (Addendum)
Delay in lactic acid re-collect. Difficult stick. Main lab phlebotomy has been called to come draw lab. Will be here ASAP

## 2017-12-25 NOTE — ED Provider Notes (Signed)
[facility] DEPT Provider Note   CSN: [id number] Arrival date & time: 12/25/17  0940     History   Chief Complaint Chief Complaint  Patient presents with  . Pneumonia    HPI Preston Moore is a 84 y.o. male.  HPI   Patient is an 84 year old male with a history of diabetes mellitus protheses on insulin), hypertension, hyperlipidemia, and irregular heart rhythm (not on [facility]) presenting for clinical deterioration while being treated for pneumonia at his facility.  Patient has been on multiple antibiotic regimens including vancomycin, cefepime, and Levaquin, scheduled to be completed tomorrow, but clinically has had tachycardia, soft blood pressures.  Patient remains afebrile.  Patient only able to identify that he is thirsty.  Patient reporting that he has some abdominal tenderness, but no other complaints at this time.  Level 5 caveat dementia and altered mental status.  Collateral information obtained from patient's daughters were at bedside.  They report that he has had a rapid decline over the past 48 hours.  Patient had interim improvement since his last hospitalization his mental status and ability to follow instructions, converse, and engage with them.  They report that  in the past 48 hours, he has become less responsive, wants to sleep all the time, and has been noting some epigastric discomfort without further explanation.  Past Medical History:  Diagnosis Date  . Atrial flutter ([place])   . B12 deficiency 02/22/2012  . Closed right hip fracture, initial encounter ([place]) 04/06/2017  . Diabetes mellitus   . Hyperlipidemia   . Hypertension   . Ileus ([place]) 02/29/2012  . Malnutrition ([place]) 02/29/2012  . Neuropathy   . Non compliance with medical treatment 02/29/2012  . Pancreatitis 02/22/2012  . SBO (small bowel obstruction) ([place]) 02/23/2012  . Syncope and collapse     Patient Active Problem List   Diagnosis Date Noted  . Decubitus ulcer of left  buttock, stage 3 ([place]) 12/12/2017  . Acute encephalopathy 11/15/2017  . Sepsis ([place]) 11/14/2017  . Depression 10/28/2017  . Insomnia due to other mental disorder 10/28/2017  . Edema of both lower extremities 10/28/2017  . Pressure injury of skin of right buttock 10/28/2017  . Blisters of multiple sites 10/25/2017  . Diabetic peripheral neuropathy ([place]) 10/25/2017  . Type II diabetes mellitus with renal manifestations, uncontrolled ([place]) 08/21/2017  . Dyslipidemia associated with type 2 diabetes mellitus ([place]) 08/21/2017  . Type 2 diabetes mellitus with diabetic neuropathy, with long-term current use of insulin ([place]) 08/21/2017  . Fall   . Closed right hip fracture, initial encounter ([place]) 04/06/2017  . CKD (chronic kidney disease), stage III ([place])   . Essential hypertension   . Obesity (BMI 30.0-34.9) 02/29/2012  . GERD without esophagitis 02/22/2012  . Iron deficiency anemia 02/18/2012  . Ischemic cardiomyopathy 11/01/2010  . UNSPECIFIED PERIPHERAL VASCULAR DISEASE 10/05/2010    Past Surgical History:  Procedure Laterality Date  . BACK SURGERY    . COMPRESSION HIP SCREW Right 04/07/2017   Procedure: RIGHT HIP INTRATROCHANTERIC NAILING;  Surgeon: [name], MD;  Location: [facility];  Service: Orthopedics;  Laterality: Right;  . LAMINECTOMY  1965       Home Medications    Prior to Admission medications   Medication Sig Start Date End Date Taking? Authorizing Provider  acetaminophen (TYLENOL) 500 MG tablet Take 1,000 mg by mouth every 8 (eight) hours as needed.  09/28/17   [provider]  albuterol (ACCUNEB) 0.63 MG/3ML nebulizer solution Take 3 mLs (0.63  mg total) by nebulization every 2 (two) hours as needed for wheezing or shortness of breath. 11/17/17   Ghimire, Henreitta Leber, MD  albuterol (PROVENTIL) (2.5 MG/3ML) 0.083% nebulizer solution Take 3 mLs (2.5 mg total) by nebulization 3 (three) times daily. 11/17/17   Ghimire, Henreitta Leber, MD  Amino Acids-Protein Hydrolys  (FEEDING SUPPLEMENT, PRO-STAT SUGAR FREE 64,) LIQD Take 30 mLs 2 (two) times daily by mouth.    [provider]  atorvastatin (LIPITOR) 10 MG tablet Take 10 mg by mouth at bedtime. 10/11/17   [provider]  benzonatate (TESSALON) 200 MG capsule Take 1 capsule (200 mg total) by mouth 3 (three) times daily as needed for cough. 11/17/17   Ghimire, Henreitta Leber, MD  ceFEPime (MAXIPIME) IVPB Inject 2 g into the vein every 12 (twelve) hours. 12/20/17 12/27/17  [provider]  Cholecalciferol 50000 units TABS Give 1 tablet by mouth every Saturday    [provider]  clindamycin in dextrose 5 % 50 mL Inject 300 mg into the vein every 6 (six) hours. X 5 days 12/21/17 12/26/17  [provider]  docusate sodium (COLACE) 100 MG capsule Take 1 capsule (100 mg total) by mouth 2 (two) times daily. 04/10/17   Reyne Dumas, MD  ferrous sulfate 325 (65 FE) MG tablet Take 325 mg by mouth daily with breakfast.     [provider]  furosemide (LASIX) 20 MG tablet Take 2 tablets (40 mg total) by mouth daily. 11/17/17   Ghimire, Henreitta Leber, MD  gabapentin (NEURONTIN) 300 MG capsule Take 300 mg by mouth 3 (three) times daily.     [provider]  guaiFENesin (MUCINEX) 600 MG 12 hr tablet Take 600 mg by mouth 2 (two) times daily. X 7 days 12/20/17 12/27/17  [provider]  insulin aspart (NOVOLOG) 100 UNIT/ML injection Inject 15 Units into the skin 3 (three) times daily with meals. Inject 11 units subcutaneously after meals 10/26/17   [provider]  insulin glargine (LANTUS) 100 UNIT/ML injection Inject 57 Units into the skin at bedtime.     [provider]  ipratropium-albuterol (DUONEB) 0.5-2.5 (3) MG/3ML SOLN Take 3 mLs by nebulization every 6 (six) hours. X 3 days and every 6 hours as needed x 14 days 12/24/17 01/07/18  [provider]  LevoFLOXacin in D5W (LEVAQUIN IV) Inject 750 mg into the vein. Every other day for 7 administrations  12/21/17 01/04/18  [provider]  lisinopril (ZESTRIL) 2.5 MG tablet Take 2 tablets (5 mg total) by mouth daily. 11/17/17 11/17/18  Jonetta Osgood, MD  metoprolol succinate (TOPROL-XL) 25 MG 24 hr tablet Take 25 mg by mouth daily.    [provider]  Multiple Vitamin (MULTIVITAMIN) tablet Take 1 tablet by mouth daily.    [provider]  Nutritional Supplements (NUTRITIONAL SHAKE PO) House Shake - Give 120cc by mouth three times daily    [provider]  ondansetron (ZOFRAN) 4 MG tablet Take 4 mg by mouth every 6 (six) hours as needed for nausea or vomiting. 12/19/17   [provider]  pantoprazole (PROTONIX) 40 MG tablet Take 40 mg by mouth daily.    [provider]  polyethylene glycol (MIRALAX / GLYCOLAX) packet Take 17 g by mouth 2 (two) times daily.     [provider]  potassium chloride SA (K-DUR,KLOR-CON) 20 MEQ tablet Take 1 tablet (20 mEq total) by mouth daily. 11/18/17   Ghimire, Henreitta Leber, MD  predniSONE (DELTASONE) 20 MG tablet  Give 2 tablets by mouth in the evening x 5 days for wheezing 12/21/17 12/26/17  [provider]  Probiotic Product (PROBIOTIC DAILY) CAPS Take 1 capsule by mouth daily. 12/21/17 01/20/18  [provider]  promethazine (PHENERGAN) 25 MG suppository Place 25 mg rectally every 6 (six) hours as needed for nausea or vomiting. X 3 days 12/24/17 12/27/17  [provider]  sennosides-docusate sodium (SENOKOT-S) 8.6-50 MG tablet Take 1 tablet by mouth 2 (two) times daily. X 3 days 12/23/17 12/26/17  [provider]  Skin Protectants, Misc. (CALAZIME SKIN PROTECTANT EX) Apply 1 application topically 2 (two) times daily. Apply to buttock every shift     [provider]  Skin Protectants, Misc. (EUCERIN) cream Apply to BLE topically two times daily for dry skin    [provider]  traMADol (ULTRAM) 50 MG tablet Take 25 mg by mouth every 12 (twelve) hours as needed.    [provider]  traZODone (DESYREL) 100 MG tablet Take 100 mg by mouth at bedtime as needed for sleep.    [provider]    Family History Family History  Problem Relation Age of Onset  . Hypertension Neg Hx   . Coronary artery disease Neg Hx   . Diabetes Neg Hx     Social History Social History   Tobacco Use  . Smoking status: Current Every Day Smoker    Packs/day: 0.50    Years: 10.00    Pack years: 5.00    Types: Cigarettes  . Smokeless tobacco: Never Used  Substance Use Topics  . Alcohol use: Yes    Alcohol/week: 21.6 oz    Types: 36 Cans of beer per week  . Drug use: No     Allergies   Patient has no known allergies.   Review of Systems Review of Systems  Constitutional: Negative for chills and fever.  Respiratory: Positive for cough and shortness of breath.   Gastrointestinal: Positive for abdominal pain.   Level 5 caveat altered mental status and dementia.  Physical Exam Updated Vital Signs BP 101/79   Pulse (!) 128   Temp 98.2 F (36.8 C) (Rectal)   Resp (!) 26   Ht 6' (1.829 m)   Wt 96.6 kg (213 lb)   SpO2 94%   BMI 28.89 kg/m   Physical Exam  Constitutional: He appears well-developed and well-nourished. No distress.  HENT:  Head: Normocephalic and atraumatic.  Mouth/Throat: Oropharynx is clear and moist.  Eyes: Conjunctivae and EOM are normal.  Pupils 3 mm and minimally reactive to light.  Neck: Normal range of motion. Neck supple.  Cardiovascular: Normal rate, regular rhythm, S1 normal and S2 normal.  No murmur heard. Pulmonary/Chest: Breath sounds normal.  Tachypnea noted.  Some accessory muscle use of abdominal musculature. Diffuse rales noted in all lung fields.  No wheezing.  Abdominal: Soft. He exhibits no distension. There is tenderness. There is no guarding.  Abdomen is soft the patient wincing to palpation of the abdomen, particularly in epigastric region.  No focality to the tenderness, and patient is wincing at  palpation of all areas of the abdomen.  Musculoskeletal: Normal range of motion. He exhibits no edema or deformity.  Lymphadenopathy:    He has no cervical adenopathy.  Neurological: He is alert.  Cranial nerves grossly intact. Patient moves extremities symmetrically and with good coordination.  Skin: Skin is warm and dry. No rash noted. No erythema.  Psychiatric: He has a normal mood and affect. His  behavior is normal. Judgment and thought content normal.  Nursing note and vitals reviewed.    ED Treatments / Results  Labs (all labs ordered are listed, but only abnormal results are displayed) Labs Reviewed  COMPREHENSIVE METABOLIC PANEL - Abnormal; Notable for the following components:      Result Value   Chloride 100 (*)    Glucose, Bld 343 (*)    BUN 37 (*)    Creatinine, Ser 1.43 (*)    Calcium 8.6 (*)    Albumin 2.1 (*)    AST 113 (*)    ALT 82 (*)    GFR calc non Af Amer 44 (*)    GFR calc Af Amer 51 (*)    All other components within normal limits  CBC WITH DIFFERENTIAL/PLATELET - Abnormal; Notable for the following components:   WBC 16.3 (*)    Neutro Abs 14.8 (*)    All other components within normal limits  I-STAT CG4 LACTIC ACID, ED - Abnormal; Notable for the following components:   Lactic Acid, Venous 4.35 (*)    All other components within normal limits  CULTURE, BLOOD (ROUTINE X 2)  CULTURE, BLOOD (ROUTINE X 2)  URINE CULTURE  URINALYSIS, ROUTINE W REFLEX MICROSCOPIC  PROTIME-INR  BRAIN NATRIURETIC PEPTIDE  BLOOD GAS, VENOUS  TROPONIN I    EKG  EKG Interpretation None       Radiology Dg Chest 2 View  Result Date: 12/25/2017 CLINICAL DATA:  History pneumonia.  Cough and congestion. EXAM: CHEST - 2 VIEW COMPARISON:  11/14/2017. FINDINGS: Cardiomegaly with pulmonary venous congestion again noted. Bilateral interstitial prominence noted. Findings suggest CHF. Pneumonitis cannot be excluded. Small bilateral pleural effusions cannot be excluded. No  pneumothorax. IMPRESSION: Cardiomegaly with pulmonary venous congestion bilateral interstitial prominence suggesting mild CHF. Small bilateral pleural effusions may be present. Electronically Signed   By: Marcello Moores  Register   On: 12/25/2017 10:38    Procedures Procedures (including critical care time)  CRITICAL CARE Performed by: Albesa Seen   Total critical care time: 35 minutes  Critical care time was exclusive of separately billable procedures and treating other patients.  Critical care was necessary to treat or prevent imminent or life-threatening deterioration.  Critical care was time spent personally by me on the following activities: development of treatment plan with patient and/or surrogate as well as nursing, discussions with consultants, evaluation of patient's response to treatment, examination of patient, obtaining history from patient or surrogate, ordering and performing treatments and interventions, ordering and review of laboratory studies, ordering and review of radiographic studies, pulse oximetry and re-evaluation of patient's condition.  Sepsis - Repeat Assessment  Performed at:    1230  Vitals     Blood pressure (!) 93/56, pulse (!) 110, temperature 98.2 F (36.8 C), temperature source Rectal, resp. rate (!) 26, height 6' (1.829 m), weight 96.6 kg (213 lb), SpO2 94 %.  Heart:     Tachycardic,but improved from initial presentation  Lungs:    Rales, consistent   Capillary Refill:   <2 sec  Peripheral Pulse:   Dorsalis pedis pulse  palpable and 2+  Skin:     Normal Color    Medications Ordered in ED Medications  sodium chloride 0.9 % bolus 3,000 mL (3,000 mLs Intravenous New Bag/Given 12/25/17 1050)     Initial Impression / Assessment and Plan / ED Course  I have reviewed the triage vital signs and the nursing notes.  Pertinent labs & imaging results that were available during  my care of the patient were reviewed by me and considered in my medical  decision making (see chart for details).  Clinical Course as of Dec 25 1845  Tue Dec 25, 2017  1242 CT of the head result noted.  Appears to be no acute intracranial changes since January.  [AM]  1252 Received call from radiology concerning for possible cholecystitis with fluid collection and fluid under the hemidiaphragm.  Patient is already on broad-spectrum antibiotics.  [AM]  5003 Lab obtaining repeat lactic, which is in process.  Personally had a discussion with Dr. Elsworth Soho and Dr. Marlou Starks at beside during my reevaluation.  [AM]    Clinical Course User Index [AM] Albesa Seen, PA-C    Patient is critically ill appearing and requiring higher level of care.  Patient is tachypneic, tachycardic at 128, and exhibits soft pressures of 101/79 given that patient is hypertensive at baseline.  Patient is afebrile per rectal temp of 98.2.  Patient has had a long course of pneumonia treatment both inpatient in early February 2019 as well as at his facility where he is treated. Patient meeting sepsis criteria with tachycardia, tachypnea, and WBC count of 16.8.  Patient does have a lactic of 4.35.  Given pulmonary edema noted on chest x-ray, will proceed with 2 L of normal saline his initial fluid bolus.  Chest x-ray, reviewed by me demonstrates diffuse interstitial edema, but no clear infiltrate.  Unclear source of infection, given that patient has multiple decubitus ulcers as well as some tenderness to the abdomen, particularly on right side with some evidence of involuntary guarding.  Will proceed with CT scan of the abdomen and pelvis.  Further assessment reveals EKG with an irregularly irregular rhythm with tachycardia, baseline difficult to assess whether P waves present.  Initial troponin 0 0.03, which I suspect is likely due to demand ischemia.  Patient exhibits AK I, based on lab work performed yesterday.  Creatinine 1.43 and BUN 37.  I suspect that this is prerenal secondary to patient's sepsis.  No  clear infection seen in urinalysis.  11:11 AM spoke with Dr. Johnnye Sima, of infectious disease who recommends Linezolid and meropenem given patient's multiple broad-spectrum antibiotic courses within the last month.  Spoke with Altha Harm, pharmacist assisted in dosing of these.  1:16 PM please consult to intensivist to discuss  patient case and admitting prior to right upper quadrant results.  Discussed that we will require the intensivist at [facility], as we have coverage today.  2:28 PM Spoke with Dr. Elsworth Soho, intensivist here at [facility], who will admit the patient.   Final Clinical Impressions(s) / ED Diagnoses   Final diagnoses:  Cholecystitis  Sepsis, due to unspecified organism [facility])  Peritonitis [facility])  Abdominal infection [facility])    ED Discharge Orders    None       Tamala Julian 12/25/17 1857    Carmin Muskrat, MD 12/28/17 1319

## 2017-12-25 NOTE — ED Notes (Signed)
Pt currently in CT.

## 2017-12-25 NOTE — ED Notes (Addendum)
Vanita Panda MD face to face interaction aware of LA of 4.35.

## 2017-12-25 NOTE — ED Notes (Signed)
Pt has had some ice  Chips, per hospitalist permission.

## 2017-12-26 ENCOUNTER — Inpatient Hospital Stay (HOSPITAL_COMMUNITY): Payer: Medicare Other

## 2017-12-26 ENCOUNTER — Encounter (HOSPITAL_COMMUNITY): Payer: Self-pay | Admitting: Interventional Radiology

## 2017-12-26 DIAGNOSIS — K819 Cholecystitis, unspecified: Secondary | ICD-10-CM

## 2017-12-26 DIAGNOSIS — E43 Unspecified severe protein-calorie malnutrition: Secondary | ICD-10-CM

## 2017-12-26 HISTORY — PX: IR IMAGE GUIDED DRAINAGE PERCUT CATH  PERITONEAL RETROPERIT: IMG5467

## 2017-12-26 HISTORY — PX: IR PERC CHOLECYSTOSTOMY: IMG2326

## 2017-12-26 LAB — GLUCOSE, CAPILLARY
GLUCOSE-CAPILLARY: 108 mg/dL — AB (ref 65–99)
GLUCOSE-CAPILLARY: 150 mg/dL — AB (ref 65–99)
Glucose-Capillary: 160 mg/dL — ABNORMAL HIGH (ref 65–99)
Glucose-Capillary: 193 mg/dL — ABNORMAL HIGH (ref 65–99)
Glucose-Capillary: 215 mg/dL — ABNORMAL HIGH (ref 65–99)
Glucose-Capillary: 144 mg/dL — ABNORMAL HIGH (ref 65–99)

## 2017-12-26 LAB — BASIC METABOLIC PANEL
ANION GAP: 11 (ref 5–15)
BUN: 34 mg/dL — AB (ref 6–20)
CHLORIDE: 109 mmol/L (ref 101–111)
CO2: 21 mmol/L — ABNORMAL LOW (ref 22–32)
Calcium: 8.4 mg/dL — ABNORMAL LOW (ref 8.9–10.3)
Creatinine, Ser: 1.37 mg/dL — ABNORMAL HIGH (ref 0.61–1.24)
GFR calc Af Amer: 54 mL/min — ABNORMAL LOW (ref >60)
GFR calc non Af Amer: 47 mL/min — ABNORMAL LOW (ref >60)
Glucose, Bld: 151 mg/dL — ABNORMAL HIGH (ref 65–99)
POTASSIUM: 3.8 mmol/L (ref 3.5–5.1)
SODIUM: 141 mmol/L (ref 135–145)

## 2017-12-26 LAB — BLOOD CULTURE ID PANEL (REFLEXED)
ACINETOBACTER BAUMANNII: NOT DETECTED
CANDIDA KRUSEI: NOT DETECTED
CANDIDA TROPICALIS: NOT DETECTED
Candida albicans: NOT DETECTED
Candida glabrata: NOT DETECTED
Candida parapsilosis: NOT DETECTED
Enterobacter cloacae complex: NOT DETECTED
Enterobacteriaceae species: NOT DETECTED
Enterococcus species: NOT DETECTED
Escherichia coli: NOT DETECTED
HAEMOPHILUS INFLUENZAE: NOT DETECTED
KLEBSIELLA OXYTOCA: NOT DETECTED
KLEBSIELLA PNEUMONIAE: NOT DETECTED
Listeria monocytogenes: NOT DETECTED
METHICILLIN RESISTANCE: DETECTED — AB
Neisseria meningitidis: NOT DETECTED
PROTEUS SPECIES: NOT DETECTED
Pseudomonas aeruginosa: NOT DETECTED
SERRATIA MARCESCENS: NOT DETECTED
STAPHYLOCOCCUS AUREUS BCID: NOT DETECTED
STAPHYLOCOCCUS SPECIES: DETECTED — AB
Streptococcus agalactiae: NOT DETECTED
Streptococcus pneumoniae: NOT DETECTED
Streptococcus pyogenes: NOT DETECTED
Streptococcus species: NOT DETECTED

## 2017-12-26 LAB — URINE CULTURE: CULTURE: NO GROWTH

## 2017-12-26 LAB — CBC
HCT: 35.5 % — ABNORMAL LOW (ref 39.0–52.0)
HEMOGLOBIN: 11.6 g/dL — AB (ref 13.0–17.0)
MCH: 28.6 pg (ref 26.0–34.0)
MCHC: 32.7 g/dL (ref 30.0–36.0)
MCV: 87.7 fL (ref 78.0–100.0)
Platelets: 370 10*3/uL (ref 150–400)
RBC: 4.05 MIL/uL — AB (ref 4.22–5.81)
RDW: 15.5 % (ref 11.5–15.5)
WBC: 17.6 10*3/uL — ABNORMAL HIGH (ref 4.0–10.5)

## 2017-12-26 LAB — PHOSPHORUS: PHOSPHORUS: 2.1 mg/dL — AB (ref 2.5–4.6)

## 2017-12-26 LAB — MAGNESIUM: MAGNESIUM: 1.5 mg/dL — AB (ref 1.7–2.4)

## 2017-12-26 MED ORDER — LIDOCAINE HCL 1 % IJ SOLN
INTRAMUSCULAR | Status: AC | PRN
  Administered 2017-12-26 (×2): 5 mL

## 2017-12-26 MED ORDER — MORPHINE SULFATE (PF) 4 MG/ML IV SOLN
4.0000 mg | Freq: Once | INTRAVENOUS | Status: AC
  Administered 2017-12-26: 4 mg via INTRAVENOUS
  Filled 2017-12-26: qty 1

## 2017-12-26 MED ORDER — LIDOCAINE HCL 1 % IJ SOLN
INTRAMUSCULAR | Status: AC
  Filled 2017-12-26: qty 20

## 2017-12-26 MED ORDER — MIDAZOLAM HCL 2 MG/2ML IJ SOLN
INTRAMUSCULAR | Status: AC | PRN
  Administered 2017-12-26 (×2): 0.5 mg via INTRAVENOUS
  Administered 2017-12-26: 1 mg via INTRAVENOUS

## 2017-12-26 MED ORDER — MAGNESIUM SULFATE 2 GM/50ML IV SOLN
2.0000 g | Freq: Once | INTRAVENOUS | Status: AC
  Administered 2017-12-26: 2 g via INTRAVENOUS
  Filled 2017-12-26: qty 50

## 2017-12-26 MED ORDER — MIDAZOLAM HCL 2 MG/2ML IJ SOLN
INTRAMUSCULAR | Status: AC
  Filled 2017-12-26: qty 4

## 2017-12-26 MED ORDER — POTASSIUM PHOSPHATES 15 MMOLE/5ML IV SOLN
20.0000 mmol | Freq: Once | INTRAVENOUS | Status: AC
  Administered 2017-12-26: 20 mmol via INTRAVENOUS
  Filled 2017-12-26: qty 6.67

## 2017-12-26 MED ORDER — FENTANYL CITRATE (PF) 100 MCG/2ML IJ SOLN
INTRAMUSCULAR | Status: AC
  Filled 2017-12-26: qty 4

## 2017-12-26 MED ORDER — IOPAMIDOL (ISOVUE-300) INJECTION 61%
10.0000 mL | Freq: Once | INTRAVENOUS | Status: AC | PRN
  Administered 2017-12-26: 10 mL

## 2017-12-26 MED ORDER — METOPROLOL TARTRATE 5 MG/5ML IV SOLN
2.5000 mg | INTRAVENOUS | Status: DC | PRN
  Administered 2017-12-26: 2.5 mg via INTRAVENOUS
  Administered 2017-12-26: 5 mg via INTRAVENOUS
  Administered 2017-12-31: 2.5 mg via INTRAVENOUS
  Filled 2017-12-26 (×3): qty 5

## 2017-12-26 MED ORDER — FENTANYL CITRATE (PF) 100 MCG/2ML IJ SOLN
12.0000 ug | INTRAMUSCULAR | Status: DC | PRN
  Administered 2017-12-26: 12.5 ug via INTRAVENOUS
  Administered 2017-12-26 – 2017-12-27 (×3): 25 ug via INTRAVENOUS
  Administered 2017-12-27: 12 ug via INTRAVENOUS
  Administered 2017-12-27 – 2017-12-28 (×4): 25 ug via INTRAVENOUS
  Filled 2017-12-26 (×8): qty 2

## 2017-12-26 MED ORDER — IOPAMIDOL (ISOVUE-300) INJECTION 61%
INTRAVENOUS | Status: AC
  Administered 2017-12-26: 10 mL
  Filled 2017-12-26: qty 50

## 2017-12-26 MED ORDER — FENTANYL CITRATE (PF) 100 MCG/2ML IJ SOLN
INTRAMUSCULAR | Status: AC
  Filled 2017-12-26: qty 2

## 2017-12-26 MED ORDER — FENTANYL CITRATE (PF) 100 MCG/2ML IJ SOLN
INTRAMUSCULAR | Status: AC | PRN
  Administered 2017-12-26 (×2): 25 ug via INTRAVENOUS
  Administered 2017-12-26: 50 ug via INTRAVENOUS

## 2017-12-26 MED ORDER — SODIUM CHLORIDE 0.9% FLUSH
5.0000 mL | Freq: Two times a day (BID) | INTRAVENOUS | Status: DC
  Administered 2017-12-26 – 2018-01-04 (×17): 5 mL

## 2017-12-26 NOTE — Progress Notes (Signed)
CC:  Liver abscess/cholecystitis  Subjective: He looks better this Am, talking some this AM, he didn't speak at all yesterday.  Daughter is in the room and asking allot of questions. He is not complaining of allot of pain this AM.  Seems fairly comfortable.  Objective: Vital signs in last 24 hours: Temp:  [97.7 F (36.5 C)-98.7 F (37.1 C)] 98.7 F (37.1 C) (03/13 0435) Pulse Rate:  [51-128] 109 (03/13 0600) Resp:  [18-44] 24 (03/13 0600) BP: (88-135)/(39-111) 132/70 (03/13 0600) SpO2:  [89 %-96 %] 94 % (03/13 0600) Weight:  [96.6 kg (213 lb)-98.2 kg (216 lb 7.9 oz)] 98.2 kg (216 lb 7.9 oz) (03/13 0409)   800 IV recorded  800 urine recorded Stool x 2 Afebrile, BP is better, HR is variable  Saturations - 89-96% on [place] Na 141 Creatinine better 1.37 Mag 1.5/K+ 3.8 WBC 17.6;  H/H down slightly 11.6/35.5 HIDA then ? Perc drain later today Intake/Output from previous day: 03/12 0701 - 03/13 0700 In: 800 [IV Piggyback:800] Out: 800 [Urine:800] Intake/Output this shift: No intake/output data recorded.  General appearance: alert, cooperative and no distress GI: soft, not really distended, few BS, no complaints of pain right now.  Lab Results:  Recent Labs    12/25/17 1026 12/26/17 0554  WBC 16.3* 17.6*  HGB 13.0 11.6*  HCT 39.7 35.5*  PLT 356 370    BMET Recent Labs    12/25/17 1026 12/26/17 0554  NA 135 141  K 4.4 3.8  CL 100* 109  CO2 22 21*  GLUCOSE 343* 151*  BUN 37* 34*  CREATININE 1.43* 1.37*  CALCIUM 8.6* 8.4*   PT/INR Recent Labs    12/25/17 1121  LABPROT 15.2  INR 1.21    Recent Labs  Lab 12/25/17 1026  AST 113*  ALT 82*  ALKPHOS 108  BILITOT 0.8  PROT 7.2  ALBUMIN 2.1*     Lipase     Component Value Date/Time   LIPASE 33 02/27/2012 0500     Medications: . Chlorhexidine Gluconate Cloth  6 each Topical Daily  . insulin aspart  0-20 Units Subcutaneous Q4H  . insulin glargine  30 Units Subcutaneous QHS  . lactobacillus  1 g  Oral Q breakfast  . sodium chloride flush  10-40 mL Intracatheter Q12H   Anti-infectives (From admission, onward)   Start     Dose/Rate Route Frequency Ordered Stop   12/25/17 2200  linezolid (ZYVOX) IVPB 600 mg     600 mg 300 mL/hr over 60 Minutes Intravenous Every 12 hours 12/25/17 1133     12/25/17 2200  meropenem (MERREM) 1 g in sodium chloride 0.9 % 100 mL IVPB     1 g 200 mL/hr over 30 Minutes Intravenous Every 12 hours 12/25/17 1133     12/25/17 1115  linezolid (ZYVOX) IVPB 600 mg     600 mg 300 mL/hr over 60 Minutes Intravenous STAT 12/25/17 1108 12/25/17 1346   12/25/17 1115  meropenem (MERREM) 1 g in sodium chloride 0.9 % 100 mL IVPB     1 g 200 mL/hr over 30 Minutes Intravenous STAT 12/25/17 1108 12/25/17 1244      Assessment/Plan Bilateral pneumonia/atelectasis Acute kidney disease Systolic heart failure with an EF 35%/Mild congestive failure Possible recurrent pneumonia Altered mental status-acute on chronic metabolic encephalopathy.  Failure to thrive/significantly decreased mobility PAD with right medial heel ulcer  Sepsis/Lactic acidosis Bacteremia - MRSA Liver abscess-subdiaphragmatic fluid collection/possible acute cholecystitis  FEN: ID:  Linezolid/Meropenem 3/12/ =>>  day 2 DVT:  SCD - ? Percutaneous drain later Foley: In Follow up:  TBD  Plan:  He is set up for a HIDA and then IR evaluation.           LOS: 1 day    Marcie Shearon 12/26/2017 [phone]

## 2017-12-26 NOTE — Progress Notes (Signed)
Initial Nutrition Assessment  DOCUMENTATION CODES:   Obesity unspecified, Severe malnutrition in context of acute illness/injury  INTERVENTION:   - RD to order Glucerna PO TID once diet advanced, each supplement provides 350 kcal and 20 grams of protein - RD to order 1 packet Juven BID once diet advanced, each packet provides 80 calories, 8 grams of carbohydrate, and 14 grams of amino acids; supplement contains CaHMB, glutamine, and arginine, to promote wound healing - Encourage PO intake once diet advanced  NUTRITION DIAGNOSIS:   Severe Malnutrition related to acute illness(acute cholecystitis) as evidenced by percent weight loss, energy intake < or equal to 50% for > or equal to 5 days(12.3% weight loss in 1 month).  GOAL:   Patient will meet greater than or equal to 90% of their needs  MONITOR:   Diet advancement, Labs, Weight trends  REASON FOR ASSESSMENT:   Low Braden   ASSESSMENT:   84 year old male admitted with sepsis from a SNF. Pt is bedbound s/p bilateral hip replacement 2 years ago. PMH significant for DM on insulin, HTN, vitamin B12 deficiency, SBO in 2013, and HLD. Pt found to have liver abscess but is not a surgical candidate.  12/26/17 - HIDA then IR evaluation  Spoke with pt's daughter at bedside as pt was not speaking at time of RD visit. Pt's daughter states pt will be going for a procedure later today. Per radiology note, request received from surgery for percutaneous cholecystectomy.  Pt's daughter also reports pt has had a poor appetite ("nonexistent") for the past 1-2 weeks as he has not been feeling well. Daughter states pt has mostly been consuming clear liquids during this time. Pt's daughter states that pt's appetite is normally better than it is currently but "not great" and that this has been going on for several months.  Pt's daughter is concerned that pt has not had any oral intake in over 24 hours. Discussed need for NPO for tests and procedures  and that MD will monitor for diet advancement.  Pt's daughter reports that breakfast is pt's best meal and that he eats "bites" throughout the rest of the day. Pt consumed two oral nutrition supplements daily, either Ensure or Boost. RD to order once pt's diet is advanced.  Pt's daughter reports pt's UBW as 140 lbs and endorses recent weight loss. Per weight history in chart, pt has experienced a 12.3% weight loss in 1 month which is significant for timeframe.  Medications reviewed and include: sliding scale Novolog every 4 hours, 30 units Lantus daily, 1 g lactobacillus daily, 2 g IV magnesium sulfate, potassium phosphate in D5  Labs reviewed: CO2 21, BUN 34, creatinine 1.37 CBG's: 160, 108, 150, 191, 237, 263, 280 x 2 hours  NUTRITION - FOCUSED PHYSICAL EXAM:    Most Recent Value  Orbital Region  No depletion  Upper Arm Region  Mild depletion  Thoracic and Lumbar Region  Mild depletion  Buccal Region  No depletion  Temple Region  No depletion  Clavicle Bone Region  No depletion  Clavicle and Acromion Bone Region  No depletion  Scapular Bone Region  Unable to assess  Dorsal Hand  Mild depletion  Patellar Region  Mild depletion  Anterior Thigh Region  Mild depletion  Posterior Calf Region  Mild depletion  Edema (RD Assessment)  None  Hair  Reviewed  Eyes  Reviewed  Mouth  Reviewed  Skin  Reviewed  Nails  Reviewed     Pt with significantly decreased mobility. Suspect mild  muscle depletion related to limited activity.  Diet Order:  Diet NPO time specified Except for: Sips with Meds, Ice Chips  EDUCATION NEEDS:   No education needs have been identified at this time  Skin:  Skin Assessment: Skin Integrity Issues: Skin Integrity Issues:: Stage II, Incisions, Other (Comment) Stage II: bilateral buttocks Other: wound to L and R heel  Last BM:  12/26/17 large type 7  Height:   Ht Readings from Last 1 Encounters:  12/26/17 5\' 11"  (1.803 m)    Weight:   Wt Readings from  Last 1 Encounters:  12/26/17 216 lb 7.9 oz (98.2 kg)    Ideal Body Weight:  78.2 kg  BMI:  Body mass index is 30.19 kg/m.  Estimated Nutritional Needs:   Kcal:  2250-2450 kcal/day  Protein:  100-115 grams/day  Fluid:  2.2-2.4 L/day    Gaynell Face, MS, RD, LDN Pager: [phone] Weekend/After Hours: [phone]

## 2017-12-26 NOTE — Consult Note (Signed)
Chief Complaint: Patient was seen in consultation today for cutaneous cholecystostomy with possible aspiration of right upper abdominal fluid collection Chief Complaint  Patient presents with  . Pneumonia    Referring Physician(s): Toth,P  Supervising Physician: Aletta Edouard  Patient Status: [facility] - In-pt  History of Present Illness: Preston Moore is a 84 y.o. male who has been chronically bedbound after bilateral hip replacement 2 years ago and currently residing in a nursing home.  He was recently admitted in February with flu and heart failure.  He has been on antibiotic therapy however presented again recently with tachycardia, soft blood pressures and fevers.  He was noted to have elevated lactate with leukocytosis and subsequent imaging revealing several foci within the medial segment of left lobe of liver which appear contiguous with the gallbladder as well as a subdiaphragmatic fluid collection, bilateral lower lobe airspace disease compatible with pneumonia and small perisplenic fluid collection.  Ultrasound of abdomen revealed mildly thickened gallbladder wall but no definite gallstones, question acalculous cholecystitis.  Nuclear medicine hepatobiliary scan today revealed lack of definitive filling of the gallbladder despite morphine.  Patient is a poor surgical candidate.  Request now received from surgery for percutaneous cholecystostomy.  Past Medical History:  Diagnosis Date  . Atrial flutter ([place])   . B12 deficiency 02/22/2012  . Closed right hip fracture, initial encounter ([place]) 04/06/2017  . Diabetes mellitus   . Hyperlipidemia   . Hypertension   . Ileus ([place]) 02/29/2012  . Malnutrition ([place]) 02/29/2012  . Neuropathy   . Non compliance with medical treatment 02/29/2012  . Pancreatitis 02/22/2012  . SBO (small bowel obstruction) ([place]) 02/23/2012  . Syncope and collapse     Past Surgical History:  Procedure Laterality Date  . BACK SURGERY    . COMPRESSION HIP SCREW  Right 04/07/2017   Procedure: RIGHT HIP INTRATROCHANTERIC NAILING;  Surgeon: [name], MD;  Location: [place];  Service: Orthopedics;  Laterality: Right;  . LAMINECTOMY  1965    Allergies: Patient has no known allergies.  Medications: Prior to Admission medications   Medication Sig Start Date End Date Taking? Authorizing Provider  acetaminophen (TYLENOL) 500 MG tablet Take 1,000 mg by mouth every 8 (eight) hours as needed for moderate pain.  09/28/17  Yes [provider]  albuterol (ACCUNEB) 0.63 MG/3ML nebulizer solution Take 3 mLs (0.63 mg total) by nebulization every 2 (two) hours as needed for wheezing or shortness of breath. 11/17/17  Yes Ghimire, Henreitta Leber, MD  albuterol (PROVENTIL) (2.5 MG/3ML) 0.083% nebulizer solution Take 3 mLs (2.5 mg total) by nebulization 3 (three) times daily. 11/17/17  Yes Ghimire, Henreitta Leber, MD  Amino Acids-Protein Hydrolys (FEEDING SUPPLEMENT, PRO-STAT SUGAR FREE 64,) LIQD Take 30 mLs 2 (two) times daily by mouth.   Yes [provider]  atorvastatin (LIPITOR) 10 MG tablet Take 10 mg by mouth at bedtime. 10/11/17  Yes [provider]  benzonatate (TESSALON) 200 MG capsule Take 1 capsule (200 mg total) by mouth 3 (three) times daily as needed for cough. 11/17/17  Yes Ghimire, Henreitta Leber, MD  ceFEPime (MAXIPIME) IVPB Inject 2 g into the vein every 12 (twelve) hours. 12/20/17 12/27/17 Yes [provider]  Cholecalciferol (VITAMIN D3) 10000 units TABS Take 1,000 Units by mouth daily.   Yes [provider]  docusate sodium (COLACE) 100 MG capsule Take 1 capsule (100 mg total) by mouth 2 (two) times daily. 04/10/17  Yes Reyne Dumas, MD  ferrous sulfate 325 (65 FE) MG tablet  Take 325 mg by mouth daily with breakfast.    Yes [provider]  furosemide (LASIX) 40 MG tablet Take 40 mg by mouth daily.    Yes [provider]  gabapentin (NEURONTIN) 300 MG capsule Take 300 mg by mouth 3 (three) times daily.    Yes  [provider]  guaiFENesin (MUCINEX) 600 MG 12 hr tablet Take 600 mg by mouth 2 (two) times daily. X 7 days 12/20/17 12/27/17 Yes [provider]  insulin aspart (NOVOLOG) 100 UNIT/ML injection Inject 15 Units into the skin 3 (three) times daily with meals. Inject 11 units subcutaneously after meals 10/26/17  Yes [provider]  insulin glargine (LANTUS) 100 UNIT/ML injection Inject 57 Units into the skin at bedtime.    Yes [provider]  ipratropium-albuterol (DUONEB) 0.5-2.5 (3) MG/3ML SOLN Take 3 mLs by nebulization every 6 (six) hours as needed (sob and wheezing). X 3 days and every 6 hours as needed x 14 days  12/24/17 01/07/18 Yes [provider]  lisinopril (PRINIVIL,ZESTRIL) 5 MG tablet Take 5 mg by mouth daily.   Yes [provider]  metoprolol succinate (TOPROL-XL) 25 MG 24 hr tablet Take 25 mg by mouth daily.   Yes [provider]  Multiple Vitamin (MULTIVITAMIN) tablet Take 1 tablet by mouth daily.   Yes [provider]  Nutritional Supplements (NUTRITIONAL SHAKE PO) House Shake - Give 120cc by mouth three times daily   Yes [provider]  ondansetron (ZOFRAN) 4 MG tablet Take 4 mg by mouth every 6 (six) hours as needed for nausea or vomiting. 12/19/17  Yes [provider]  pantoprazole (PROTONIX) 40 MG tablet Take 40 mg by mouth daily.   Yes [provider]  polyethylene glycol (MIRALAX / GLYCOLAX) packet Take 17 g by mouth 2 (two) times daily.    Yes [provider]  potassium chloride SA (K-DUR,KLOR-CON) 20 MEQ tablet Take 1 tablet (20 mEq total) by mouth daily. 11/18/17  Yes Ghimire, Henreitta Leber, MD  Probiotic Product (PROBIOTIC DAILY) CAPS Take 1 capsule by mouth daily. 12/21/17 01/20/18 Yes [provider]  promethazine (PHENERGAN) 25 MG suppository Place 25 mg rectally every 6 (six) hours as needed for nausea or vomiting. X 3 days 12/24/17 12/27/17 Yes [provider]    Skin Protectants, Misc. (CALAZIME SKIN PROTECTANT EX) Apply 1 application topically 2 (two) times daily. Apply to buttock every shift    Yes [provider]  Skin Protectants, Misc. (EUCERIN) cream Apply to BLE topically two times daily for dry skin   Yes [provider]  traMADol (ULTRAM) 50 MG tablet Take 25 mg by mouth every 12 (twelve) hours as needed for moderate pain.    Yes [provider]  traZODone (DESYREL) 100 MG tablet Take 100 mg by mouth at bedtime as needed for sleep.   Yes [provider]  clindamycin in dextrose 5 % 50 mL Inject 300 mg into the vein every 8 (eight) hours. X 5 days  12/21/17 12/26/17  [provider]  LevoFLOXacin in D5W (LEVAQUIN IV) Inject 750 mg into the vein. Every other day for 7 administrations 12/21/17 01/04/18  [provider]  lisinopril (ZESTRIL) 2.5 MG tablet Take 2 tablets (5 mg total) by mouth daily. Patient not taking: Reported on 12/25/2017 11/17/17 11/17/18  Jonetta Osgood, MD  sennosides-docusate sodium (SENOKOT-S) 8.6-50 MG tablet Take 1 tablet by mouth 2 (two) times daily. X 3 days 12/23/17 12/26/17  [provider]  Family History  Problem Relation Age of Onset  . Hypertension Neg Hx   . Coronary artery disease Neg Hx   . Diabetes Neg Hx     Social History   Socioeconomic History  . Marital status: Widowed    Spouse name: None  . Number of children: Y  . Years of education: None  . Highest education level: None  Social Needs  . Financial resource strain: None  . Food insecurity - worry: None  . Food insecurity - inability: None  . Transportation needs - medical: None  . Transportation needs - non-medical: None  Occupational History  . Occupation: retired Research scientist (medical): RETIRED  Tobacco Use  . Smoking status: Current Every Day Smoker    Packs/day: 0.50    Years: 10.00    Pack years: 5.00    Types: Cigarettes  . Smokeless tobacco: Never Used  Substance  and Sexual Activity  . Alcohol use: Yes    Alcohol/week: 21.6 oz    Types: 36 Cans of beer per week  . Drug use: No  . Sexual activity: No  Other Topics Concern  . None  Social History Narrative  . None      Review of Systems see above; currently afebrile, denies substernal chest pain, worsening dyspnea, nausea, vomiting or bleeding.  He is weak.  Vital Signs: BP (!) 118/55 (BP Location: Right Arm)   Pulse 77   Temp 98.5 F (36.9 C) (Oral)   Resp (!) 22   Ht 5\' 11"  (3.244 m)   Wt 216 lb 7.9 oz (98.2 kg)   SpO2 95%   BMI 30.19 kg/m   Physical Exam lethargic male, chest -slightly diminished breath sounds at bases; heart  tachy, irregular.  Abdomen soft, obese, positive bowel sounds, not significantly tender in right upper quadrant at this time; tr-1+ lower extremity edema.  Imaging: Dg Chest 2 View  Result Date: 12/25/2017 CLINICAL DATA:  History pneumonia.  Cough and congestion. EXAM: CHEST - 2 VIEW COMPARISON:  11/14/2017. FINDINGS: Cardiomegaly with pulmonary venous congestion again noted. Bilateral interstitial prominence noted. Findings suggest CHF. Pneumonitis cannot be excluded. Small bilateral pleural effusions cannot be excluded. No pneumothorax. IMPRESSION: Cardiomegaly with pulmonary venous congestion bilateral interstitial prominence suggesting mild CHF. Small bilateral pleural effusions may be present. Electronically Signed   By: Marcello Moores  [place]   On: 12/25/2017 10:38   Ct Head Wo Contrast  Result Date: 12/25/2017 CLINICAL DATA:  84 year old male with altered mental status. Recent pneumonia. EXAM: CT HEAD WITHOUT CONTRAST TECHNIQUE: Contiguous axial images were obtained from the base of the skull through the vertex without intravenous contrast. COMPARISON:  Head CT without contrast 11/15/2017 and earlier. FINDINGS: Brain: Stable cerebral volume. Stable ventricle size and configuration since 2013. Small chronic lacunar infarct in the inferior left cerebellum is stable.  Patchy supratentorial cerebral white matter hypodensity is stable. No midline shift, mass effect, evidence of mass lesion, intracranial hemorrhage or evidence of cortically based acute infarction. Vascular: Calcified atherosclerosis at the skull base. No suspicious intracranial vascular hyperdensity. Skull: No acute osseous abnormality identified. Sinuses/Orbits: Improved sinus aeration since January. Residual sphenoid sinus mucosal thickening. Bilateral tympanic cavities and mastoids are clear. Other: No acute orbit or scalp soft tissue findings. IMPRESSION: 1. No acute intracranial abnormality. Stable non contrast CT appearance of the brain since January. 2. Improved sinus aeration since January with residual sphenoid sinus disease. Electronically Signed   By: Genevie Ann M.D.   On: 12/25/2017 12:33  Nm Hepatobiliary Liver Func  Result Date: 12/26/2017 CLINICAL DATA:  Gallbladder wall thickening and sludge with right upper quadrant abdominal pain EXAM: NUCLEAR MEDICINE HEPATOBILIARY IMAGING TECHNIQUE: Sequential images of the abdomen were obtained out to 60 minutes following intravenous administration of radiopharmaceutical. RADIOPHARMACEUTICALS:  5.5 mCi Tc-68m  Choletec IV COMPARISON:  Abdominal ultrasound 12/25/2017 FINDINGS: Satisfactory uptake of radiopharmaceutical from the blood pool. Biliary and bowel activity by 5 minutes. Initial non filling of the gallbladder prompted IV administration of 4 mg of morphine. Subsequent imaging over the next 30 minutes demonstrates vague activity in the area of the gallbladder fossa but no definitive gallbladder filling. IMPRESSION: 1. Lack of definitive filling of the gallbladder on hepatobiliary scan despite morphine administration. Appearance is suspicious for lack of patency of the cystic duct and cholecystitis. Electronically Signed   By: Van Clines M.D.   On: 12/26/2017 12:44   Ct Abdomen Pelvis W Contrast  Result Date: 12/25/2017 CLINICAL DATA:   Abdominal distention. EXAM: CT ABDOMEN AND PELVIS WITH CONTRAST TECHNIQUE: Multidetector CT imaging of the abdomen and pelvis was performed using the standard protocol following bolus administration of intravenous contrast. CONTRAST:  39mL ISOVUE-300 IOPAMIDOL (ISOVUE-300) INJECTION 61% COMPARISON:  02/23/2012 FINDINGS: Lower chest: Airspace opacities are identified within both lower lobes medially. Small right pleural effusion identified. Hepatobiliary: Low-attenuation fluid foci within the medial segment of left lobe of liver appear contiguous with the gallbladder, image 14/2. These measure up to 1.7 cm and are suspicious for small liver abscesses. Moderate distension of the gallbladder. There may be a stone within the gallbladder measuring approximately 2 cm. No biliary dilatation. Pancreas: Unremarkable. No pancreatic ductal dilatation or surrounding inflammatory changes. Spleen: Normal in size without focal abnormality. Adrenals/Urinary Tract: The adrenal glands appear normal. Unremarkable appearance of the kidneys. The urinary bladder is negative. Stomach/Bowel: The stomach is normal. No abnormal dilatation of the small bowel loops. The appendix is visualized and appears normal. No pathologic dilatation of the colon. Vascular/Lymphatic: Aortic atherosclerosis noted. No aneurysm. No upper abdominal adenopathy identified. No pelvic or inguinal adenopathy. Reproductive: Prostate is unremarkable. Other: Peripherally enhancing fluid collection along the undersurface of the right lobe of liver measures 5.2 by 2.6 by 4.7 cm. Suspicious for subdiaphragmatic abscess. A small volume of perisplenic fluid is noted within the left upper quadrant of the abdomen between the spleen and left hemidiaphragm. Musculoskeletal: The bones appear diffusely osteopenic. There is degenerative disc disease identified within the lumbar spine. There is a compression fracture involving the L1 vertebra which is new from CT dated 02/23/2012.  IMPRESSION: 1. Several low-attenuation foci within the medial segment of left lobe of liver appear contiguous with the gallbladder and are suspicious for small liver abscesses. 2. Given the suspicious changes within the medial segment of left lobe of liver cannot rule out gallbladder inflammation/infection. Less likely would be a gallbladder neoplasm which is invading the adjacent liver. As a next step, further evaluation with right upper quadrant gallbladder ultrasound is advised to further evaluate acute cholecystitis versus mass. 3. Sub diaphragmatic fluid collection is identified and is suspicious for abscess. 4. Small perisplenic fluid collection. Indeterminate. Cannot rule out abscess. 5. Bilateral lower lobe airspace densities compatible with pneumonia. 6.  Aortic Atherosclerosis (ICD10-I70.0). 7. These results were called by telephone at the time of interpretation on 12/25/2017 at 12:51 pm to Dr. Vanita Panda, who verbally acknowledged these results. Electronically Signed   By: Kerby Moors M.D.   On: 12/25/2017 12:52   Dg Chest [facility] 1 View  Result  Date: 12/26/2017 CLINICAL DATA:  Cough and congestion EXAM: PORTABLE CHEST 1 VIEW COMPARISON:  December 25, 2016 FINDINGS: There is airspace consolidation in the left base and right mid lung regions. These changes are similar to 1 day prior. There appears to be a degree of underlying interstitial edema. There is cardiomegaly. The pulmonary vascularity is within normal limits. There is aortic atherosclerosis. No adenopathy. There is degenerative change in the thoracic spine. There is calcification in the right carotid artery. IMPRESSION: Cardiomegaly with underlying interstitial edema. Areas of airspace consolidation in the left base and right mid lung may represent alveolar edema or pneumonia. Both entities may be present concurrently. Appearance similar 1 day prior. There is aortic atherosclerosis as well as calcification in right carotid artery. Aortic  Atherosclerosis (ICD10-I70.0). Electronically Signed   By: Lowella Grip III M.D.   On: 12/26/2017 07:15   US Abdomen Limited Ruq  Result Date: 12/25/2017 CLINICAL DATA:  Abnormal appearance of gallbladder on CT EXAM: ULTRASOUND ABDOMEN LIMITED RIGHT UPPER QUADRANT COMPARISON:  CT abdomen and pelvis December 25, 2017 FINDINGS: Gallbladder: Sludge is seen throughout the gallbladder. No well-defined gallstones are delineated by ultrasound. Gallbladder wall appears thickened. No pericholecystic fluid evident. No sonographic Murphy sign noted by sonographer. Common bile duct: Diameter: 3 mm. No intrahepatic or extrahepatic biliary duct dilatation. Liver: Liver not completely visualized due to shadowing from apparent gas. Liver echogenicity appears overall increased. No focal liver lesions are evident on this study. Portal vein is patent on color Doppler imaging with normal direction of blood flow towards the liver. IMPRESSION: 1. Gallbladder wall appears mildly thickened with sludge in the gallbladder. No gallstones evident. A degree of acalculus cholecystitis is questioned. In this regard, it may be prudent to consider nuclear medicine hepatobiliary imaging study to assess for cystic duct patency. 2. Portions of liver not well visualized due to apparent gas. Findings on CT in the periphery of the liver are not appreciable on this sonographic evaluation. The overall echogenicity of the visualized portions of liver appear increased which may indicate a degree of underlying hepatic steatosis. The sensitivity of ultrasound for detection of focal liver lesions is diminished significantly in this circumstance. Electronically Signed   By: Lowella Grip III M.D.   On: 12/25/2017 13:33    Labs:  CBC: Recent Labs    11/16/17 0550 12/24/17 12/25/17 1026 12/26/17 0554  WBC 10.4 21.7 16.3* 17.6*  HGB 11.3* 11.7* 13.0 11.6*  HCT 34.4* 36* 39.7 35.5*  PLT 366 296 356 370    COAGS: Recent Labs     04/06/17 1636 11/15/17 0414 12/25/17 1121  INR 0.96 1.15 1.21  APTT  --  31  --     BMP: Recent Labs    11/16/17 0550 11/17/17 0710 12/24/17 12/25/17 1026 12/26/17 0554  NA 138 137 136* 135 141  K 3.4* 3.5 4.2 4.4 3.8  CL 104 103  --  100* 109  CO2 26 26  --  22 21*  GLUCOSE 222* 189*  --  343* 151*  BUN 20 16 38* 37* 34*  CALCIUM 8.4* 8.5*  --  8.6* 8.4*  CREATININE 1.18 1.15 1.3 1.43* 1.37*  GFRNONAA 56* 58*  --  44* 47*  GFRAA >60 >60  --  51* 54*    LIVER FUNCTION TESTS: Recent Labs    11/14/17 2130 11/15/17 0414 11/16/17 0550 12/25/17 1026  BILITOT 0.6 0.8 0.5 0.8  AST 47* 31 53* 113*  ALT 36 29 45 82*  ALKPHOS 98  75 71 108  PROT 8.0 6.6 6.7 7.2  ALBUMIN 2.9* 2.4* 2.4* 2.1*    TUMOR MARKERS: No results for input(s): AFPTM, CEA, CA199, CHROMGRNA in the last 8760 hours.  Assessment and Plan: 84 y.o. male who has been chronically bedbound after bilateral hip replacement 2 years ago and currently residing in a nursing home.  He was recently admitted in February with flu and heart failure.  He has been on antibiotic therapy however presented again recently with tachycardia, soft blood pressures and fevers.  He was noted to have elevated lactate with leukocytosis and subsequent imaging revealing several foci within the medial segment of left lobe of liver which appear contiguous with the gallbladder as well as a subdiaphragmatic fluid collection, bilateral lower lobe airspace disease compatible with pneumonia and small perisplenic fluid collection.  Ultrasound of abdomen revealed mildly thickened gallbladder wall but no definite gallstones, question acalculous cholecystitis.  Nuclear medicine hepatobiliary scan today revealed lack of definitive filling of the gallbladder despite morphine.  Patient is a poor surgical candidate.  Request now received from surgery for percutaneous cholecystostomy.  Current labs include WBC 17.6, hemoglobin 11.6, platelets 370k, creatinine  1.37, PT 15.2, INR 1.21.  MRSA detected on blood culture.  Patient on Zyvox.Risks and benefits discussed with the patient/daughter including bleeding, infection, damage to adjacent structures,  and sepsis.  All of the patient's questions were answered, patient is agreeable to proceed. Consent signed and in chart.     Thank you for this interesting consult.  I greatly enjoyed meeting Preston Moore and look forward to participating in their care.  A copy of this report was sent to the requesting provider on this date.  Electronically Signed: D. Rowe Robert, PA-C 12/26/2017, 1:52 PM   I spent a total of 30 minutes  in face to face in clinical consultation, greater than 50% of which was counseling/coordinating care for percutaneous cholecystostomy with possible aspiration of right upper abdominal fluid collection

## 2017-12-26 NOTE — Sedation Documentation (Signed)
Patient is resting comfortably with eyes closed snoring frquently

## 2017-12-26 NOTE — Progress Notes (Signed)
PULMONARY / CRITICAL CARE MEDICINE   Name: Preston Moore MRN: [id number] DOB: 1934-03-17    ADMISSION DATE:  12/25/2017 CONSULTATION DATE:  12/26/2017   REFERRING MD:  ED  CHIEF COMPLAINT: Tachycardia, sepsis  HISTORY OF PRESENT ILLNESS:   84 year old nursing home resident, chronically bedbound after bilateral hip replacement about 2 years ago with bilateral heel ulcers and recent admission in February for influenza and new systolic heart failure with EF of 35% He was being treated at nursing facility with IV Levaquin/clindamycin and cefepime but continued to have tachycardia and soft blood pressure and fevers hence sent to the emergency room.  Found to have soft blood pressure, received 2 L fluid, lactate 4.3, with leukocytosis blood pressure improved with fluids CT abdomen showing subdiaphragmatic collection and liver abscess and ultrasound suggesting acalculus cholecystitis    SUBJECTIVE:  C/o pain left shoulder No abd pain  VITAL SIGNS: BP 106/74   Pulse 100   Temp 98.5 F (36.9 C) (Oral)   Resp (!) 22   Ht 5\' 11"  (1.803 m)   Wt 216 lb 7.9 oz (98.2 kg)   SpO2 96%   BMI 30.19 kg/m   HEMODYNAMICS:    VENTILATOR SETTINGS:    INTAKE / OUTPUT: I/O last 3 completed shifts: In: 800 [IV Piggyback:800] Out: 800 [Urine:800]  PHYSICAL EXAMINATION: Gen. Pleasant, elderly  well-nourished, in no distress, flat  affect ENT -dry mucosa, no post nasal drip Neck: No JVD, no thyromegaly, no carotid bruits Lungs: no use of accessory muscles, no dullness to percussion, decreased bilateral without rales or rhonchi  Cardiovascular: Rhythm regular, heart sounds  normal, no murmurs or gallops, 1+ peripheral edema Abdomen: soft and non-tender, no hepatosplenomegaly, BS normal. Musculoskeletal: Warm extremities, no deformities, no cyanosis or clubbing Neuro:  alert, non focal   LABS:  BMET Recent Labs  Lab 12/24/17 12/25/17 1026 12/26/17 0554  NA 136* 135 141  K 4.2 4.4 3.8   CL  --  100* 109  CO2  --  22 21*  BUN 38* 37* 34*  CREATININE 1.3 1.43* 1.37*  GLUCOSE  --  343* 151*    Electrolytes Recent Labs  Lab 12/25/17 1026 12/26/17 0554  CALCIUM 8.6* 8.4*  MG  --  1.5*  PHOS  --  2.1*    CBC Recent Labs  Lab 12/24/17 12/25/17 1026 12/26/17 0554  WBC 21.7 16.3* 17.6*  HGB 11.7* 13.0 11.6*  HCT 36* 39.7 35.5*  PLT 296 356 370    Coag's Recent Labs  Lab 12/25/17 1121  INR 1.21    Sepsis Markers Recent Labs  Lab 12/25/17 1029 12/25/17 1450 12/25/17 2108  LATICACIDVEN 4.35* 2.1*  --   PROCALCITON  --   --  4.34    ABG No results for input(s): PHART, PCO2ART, PO2ART in the last 168 hours.  Liver Enzymes Recent Labs  Lab 12/25/17 1026  AST 113*  ALT 82*  ALKPHOS 108  BILITOT 0.8  ALBUMIN 2.1*    Cardiac Enzymes Recent Labs  Lab 12/25/17 1122 12/25/17 1446  TROPONINI 0.03* 0.03*    Glucose Recent Labs  Lab 12/25/17 1741 12/25/17 2000 12/26/17 0007 12/26/17 0319 12/26/17 0721  GLUCAP 237* 191* 150* 108* 160*    Imaging Nm Hepatobiliary Liver Func  Result Date: 12/26/2017 CLINICAL DATA:  Gallbladder wall thickening and sludge with right upper quadrant abdominal pain EXAM: NUCLEAR MEDICINE HEPATOBILIARY IMAGING TECHNIQUE: Sequential images of the abdomen were obtained out to 60 minutes following intravenous administration of radiopharmaceutical. RADIOPHARMACEUTICALS:  5.5 mCi Tc-43m  Choletec IV COMPARISON:  Abdominal ultrasound 12/25/2017 FINDINGS: Satisfactory uptake of radiopharmaceutical from the blood pool. Biliary and bowel activity by 5 minutes. Initial non filling of the gallbladder prompted IV administration of 4 mg of morphine. Subsequent imaging over the next 30 minutes demonstrates vague activity in the area of the gallbladder fossa but no definitive gallbladder filling. IMPRESSION: 1. Lack of definitive filling of the gallbladder on hepatobiliary scan despite morphine administration. Appearance is  suspicious for lack of patency of the cystic duct and cholecystitis. Electronically Signed   By: Van Clines M.D.   On: 12/26/2017 12:44   Dg Chest Port 1 View  Result Date: 12/26/2017 CLINICAL DATA:  Cough and congestion EXAM: PORTABLE CHEST 1 VIEW COMPARISON:  December 25, 2016 FINDINGS: There is airspace consolidation in the left base and right mid lung regions. These changes are similar to 1 day prior. There appears to be a degree of underlying interstitial edema. There is cardiomegaly. The pulmonary vascularity is within normal limits. There is aortic atherosclerosis. No adenopathy. There is degenerative change in the thoracic spine. There is calcification in the right carotid artery. IMPRESSION: Cardiomegaly with underlying interstitial edema. Areas of airspace consolidation in the left base and right mid lung may represent alveolar edema or pneumonia. Both entities may be present concurrently. Appearance similar 1 day prior. There is aortic atherosclerosis as well as calcification in right carotid artery. Aortic Atherosclerosis (ICD10-I70.0). Electronically Signed   By: Lowella Grip III M.D.   On: 12/26/2017 07:15     STUDIES:  CT abd 3/12 >> bilateral lower lobe airspace disease, subdiaphragmatic collection, liver abscesses Ultrasound of abdomen 3/12 thickened gallbladder?  Acalculous cholecystitis Head CT 3/12>> neg HIDA 3/13 >> non filling of GB  CULTURES: Blood 3/12>> GPC 1/2 >> Urine 3/12 >>ng  ANTIBIOTICS: Meropenem 3/12>> Linezolid 3/12>>  SIGNIFICANT EVENTS: 3/13 IR drain - perc chole + abscess  LINES/TUBES:   DISCUSSION: Nursing home resident on multiple antibiotics recently treated for pneumonia, transferred for persistent tachycardia, found to be in septic shock with lactic acidosis, responded to fluids, repeat lactate pending. Sources include subdiaphragmatic/liver abscess perhaps from acalculus cholecystitis or colonic source, possible HCAP  ASSESSMENT /  PLAN:  PULMONARY A: Underlying COPD Concern for bibasilar pneumonia versus fluid overload versus atelectasis P:   Duo nebs every 6. Monitor oxygenation  CARDIOVASCULAR A:  Hypertension Lactic acidosis/septic shock -resolved H/o a flutter P:  Hold meds lopresso rprn   RENAL A:   AKI , related to sepsis P:   Monitor electrolytes  GASTROINTESTINAL A:   No issues P:  Clear liquid diet   HEMATOLOGIC A:   No issues P:  Subcu heparin  INFECTIOUS A:   Subdiaphragmatic abscess/liver abscess Acalculus cholecystitis s/p IR drain P:   Empiric meropenem and linezolid Pending culture.   ENDOCRINE A:   Uncontrolled diabetes type 2 P:   SSI resistant scale Lantus 30 units-half home dose  NEUROLOGIC A:   Severe deconditioning P:     FAMILY  - Updates: Daughters at bedside .  He has been in declining health and bedbound, definitely not a surgical candidate.  Confirmed  DNR   - Inter-disciplinary family meet or Palliative Care meeting due by:  day 7  To [facility] 3/14  Kara Mead MD. [facility]. [place] [facility] Pager [phone] If no response call [phone]    12/26/2017, 5:20 PM

## 2017-12-26 NOTE — Progress Notes (Signed)
PHARMACY - PHYSICIAN COMMUNICATION CRITICAL VALUE ALERT - BLOOD CULTURE IDENTIFICATION (BCID)  Preston Moore is an 84 y.o. male who presented to [facility] on 12/25/2017 with a chief complaint of septic shock  Assessment:  Patient with + BCID but staphylococcus species (include suspected source if known)  Name of physician (or Provider) Contacted: none  Current antibiotics: Meropenem + Linezolid  Changes to prescribed antibiotics recommended:  No changes needed at this time, will await final culture result.  Results for orders placed or performed during the hospital encounter of 12/25/17  Blood Culture ID Panel (Reflexed) (Collected: 12/25/2017 10:26 AM)  Result Value Ref Range   Enterococcus species NOT DETECTED NOT DETECTED   Listeria monocytogenes NOT DETECTED NOT DETECTED   Staphylococcus species DETECTED (A) NOT DETECTED   Staphylococcus aureus NOT DETECTED NOT DETECTED   Methicillin resistance DETECTED (A) NOT DETECTED   Streptococcus species NOT DETECTED NOT DETECTED   Streptococcus agalactiae NOT DETECTED NOT DETECTED   Streptococcus pneumoniae NOT DETECTED NOT DETECTED   Streptococcus pyogenes NOT DETECTED NOT DETECTED   Acinetobacter baumannii NOT DETECTED NOT DETECTED   Enterobacteriaceae species NOT DETECTED NOT DETECTED   Enterobacter cloacae complex NOT DETECTED NOT DETECTED   Escherichia coli NOT DETECTED NOT DETECTED   Klebsiella oxytoca NOT DETECTED NOT DETECTED   Klebsiella pneumoniae NOT DETECTED NOT DETECTED   Proteus species NOT DETECTED NOT DETECTED   Serratia marcescens NOT DETECTED NOT DETECTED   Haemophilus influenzae NOT DETECTED NOT DETECTED   Neisseria meningitidis NOT DETECTED NOT DETECTED   Pseudomonas aeruginosa NOT DETECTED NOT DETECTED   Candida albicans NOT DETECTED NOT DETECTED   Candida glabrata NOT DETECTED NOT DETECTED   Candida krusei NOT DETECTED NOT DETECTED   Candida parapsilosis NOT DETECTED NOT DETECTED   Candida tropicalis NOT  DETECTED NOT DETECTED    Nani Skillern Crowford 12/26/2017  7:07 AM

## 2017-12-26 NOTE — Procedures (Signed)
Interventional Radiology Procedure Note  Procedure: 1) Percutaneous cholecystostomy tube placement; 2) Perihepatic abscess drainage  Complications: None  Estimated Blood Loss: < 10 mL  Findings: After needle puncture of GB, immediate return of purulent fluid/bile.  10 Fr perc choly tube placed and attached to gravity bag.  Sample of fluid from GB sent for culture (Sample A).  Aspiration at level of anterior perihepatic abscess yielded thinner, but grossly cloudy fluid.  10 Fr drain placed and attached to suction bulb.  Sample of fluid sent for culture (Sample B).  [name]. Kathlene Cote, M.D Pager:  [phone]

## 2017-12-27 DIAGNOSIS — I4891 Unspecified atrial fibrillation: Secondary | ICD-10-CM

## 2017-12-27 DIAGNOSIS — K651 Peritoneal abscess: Secondary | ICD-10-CM

## 2017-12-27 LAB — CULTURE, BLOOD (ROUTINE X 2): SPECIAL REQUESTS: ADEQUATE

## 2017-12-27 LAB — CBC
HEMATOCRIT: 34.8 % — AB (ref 39.0–52.0)
HEMOGLOBIN: 11.4 g/dL — AB (ref 13.0–17.0)
MCH: 28.4 pg (ref 26.0–34.0)
MCHC: 32.8 g/dL (ref 30.0–36.0)
MCV: 86.8 fL (ref 78.0–100.0)
Platelets: 376 10*3/uL (ref 150–400)
RBC: 4.01 MIL/uL — ABNORMAL LOW (ref 4.22–5.81)
RDW: 15.4 % (ref 11.5–15.5)
WBC: 18.7 10*3/uL — ABNORMAL HIGH (ref 4.0–10.5)

## 2017-12-27 LAB — PHOSPHORUS: Phosphorus: 2.2 mg/dL — ABNORMAL LOW (ref 2.5–4.6)

## 2017-12-27 LAB — GLUCOSE, CAPILLARY
GLUCOSE-CAPILLARY: 138 mg/dL — AB (ref 65–99)
Glucose-Capillary: 143 mg/dL — ABNORMAL HIGH (ref 65–99)
Glucose-Capillary: 288 mg/dL — ABNORMAL HIGH (ref 65–99)
Glucose-Capillary: 345 mg/dL — ABNORMAL HIGH (ref 65–99)
Glucose-Capillary: 225 mg/dL — ABNORMAL HIGH (ref 65–99)

## 2017-12-27 LAB — BASIC METABOLIC PANEL
Anion gap: 9 (ref 5–15)
BUN: 30 mg/dL — AB (ref 6–20)
CHLORIDE: 106 mmol/L (ref 101–111)
CO2: 23 mmol/L (ref 22–32)
CREATININE: 1.28 mg/dL — AB (ref 0.61–1.24)
Calcium: 8.1 mg/dL — ABNORMAL LOW (ref 8.9–10.3)
GFR calc Af Amer: 59 mL/min — ABNORMAL LOW (ref >60)
GFR calc non Af Amer: 51 mL/min — ABNORMAL LOW (ref >60)
GLUCOSE: 274 mg/dL — AB (ref 65–99)
Potassium: 4 mmol/L (ref 3.5–5.1)
Sodium: 138 mmol/L (ref 135–145)

## 2017-12-27 LAB — MAGNESIUM: Magnesium: 2 mg/dL (ref 1.7–2.4)

## 2017-12-27 MED ORDER — SODIUM CHLORIDE 0.9 % IV SOLN
1.0000 g | Freq: Three times a day (TID) | INTRAVENOUS | Status: DC
  Administered 2017-12-27 – 2017-12-28 (×2): 1 g via INTRAVENOUS
  Filled 2017-12-27 (×3): qty 1

## 2017-12-27 MED ORDER — INSULIN GLARGINE 100 UNIT/ML ~~LOC~~ SOLN
15.0000 [IU] | Freq: Every day | SUBCUTANEOUS | Status: DC
  Administered 2017-12-27 – 2018-01-01 (×6): 15 [IU] via SUBCUTANEOUS
  Filled 2017-12-27 (×7): qty 0.15

## 2017-12-27 MED ORDER — METOPROLOL TARTRATE 12.5 MG HALF TABLET
12.5000 mg | ORAL_TABLET | Freq: Two times a day (BID) | ORAL | Status: DC
  Administered 2017-12-27 – 2017-12-28 (×3): 12.5 mg via ORAL
  Filled 2017-12-27 (×3): qty 1

## 2017-12-27 MED ORDER — MAGNESIUM SULFATE 2 GM/50ML IV SOLN
2.0000 g | Freq: Once | INTRAVENOUS | Status: AC
  Administered 2017-12-27: 2 g via INTRAVENOUS

## 2017-12-27 NOTE — Progress Notes (Signed)
PROGRESS NOTE    Preston Moore  DPO:[id number] DOB: May 26, 1934 DOA: 12/25/2017 PCP: Gildardo Cranker, DO    Brief Narrative:  84 year old male who presented with tachycardia.  Patient does have a significant past medical history of systolic heart failure, bilateral hip replacement about 2 years ago, bedbound and history of bilateral heel ulcers.  He is a nursing home resident, he was noted to have significant tachycardia, fever and hypotension.  On the initial physical examination blood pressure 109/69, heart rate 110, temperature 98.2, respiratory 26, dry mucous membranes, lungs were clear to auscultation bilaterally, decreased at bases, heart S1-S2 present, rhythmic, abdomen was soft nontender, no lower extremity edema.  Sodium 135, potassium 4.4, chloride 100, bicarb 22, glucose 343, BUN 37, creatinine 1.43, venous lactic acid 4.3, white count 16.3, hemoglobin 13.0, hematocrit 39.7, platelets 356, urine analysis 50 glucose, pH 5.0, protein 30, specific gravity 1.028, RBC 0-5, white cells 0-5, positive granular and hyaline casts.  Abdominal CT with several low-attenuation foci within the medial segment of the left lobe of the liver, contiguous to the gallbladder, suspicion for small liver abscess, subdiaphragmatic fluid collection is identified as suspicious for abscess.  Chest x-ray hypoinflated, left rotation, increased interstitial markings bilaterally.  EKG atrial fibrillation, poor baseline, 131 bpm, normal axis.  Patient was admitted to the hospital with a working diagnosis of sepsis due to liver abscess, acalculous cholecystitis, complicated by acute kidney injury in atrial fibrillation with rapid ventricular response.    Assessment & Plan:   Active Problems:   Septic shock (HCC)   Protein-calorie malnutrition, severe   1. Resolving sepsis due to acalculous cholecystitis and liver abscess/ complicated with MRSA bacteremia. Patient is off IV fluids, will continue antibiotic therapy with  linezolid and meropenem, will continue to follow on cultures, cell count and temperature curve. Cultures from abscess with gram positive and gram negative bacteria.   2. Atrial fibrillation and flutter. Will resume B blocker with low dose metoprolol tartrate. Continue telemetry monitoring, not on anticoagulation.   3. Systolic heart failure. Echocardiogram with depressed LV systolic function to 30 to 35%, diffuse hypokinesis. Will continue to hold on IV fluids, resume metoprolol.   4. AKI. Patient off IV fluids, poor oral intake, renal function with serum cr down to 1,28 from 1,37, K at 4,0 and serum bicarb 23, will continue to follow renal function in am.   5. HTN. Systolic blood pressure 431 to 140, will continue close monitoring.   6. COPD. Stable with no signs of exacerbation, will continue oxymetry monitoring.   7. T2DM. Will continue glucose cover and monitoring with insulin sliding scale. Will decrease basal insulin to 15 units to prevent hypoglycemia.    DVT prophylaxis: scd  Code Status:  dnr Family Communication: I spoke with patient's family at the bedside and all questions were addressed.  Disposition Plan: snf   Consultants:     Procedures:     Antimicrobials:   Meropenem  Linezolid    Subjective: Patient is feeling better, deconditioned, most history obtained from his daughter at the bedside. Had episode of tachycardia last night, required IV metoprolol.   Objective: Vitals:   12/27/17 0435 12/27/17 0500 12/27/17 0600 12/27/17 0800  BP:  (!) 115/59 114/63 (!) 143/83  Pulse:  77 88 98  Resp:  (!) 28 (!) 22 (!) 26  Temp:      TempSrc:      SpO2:  94% 95% 95%  Weight: 101 kg (222 lb 10.6 oz)  Height:        Intake/Output Summary (Last 24 hours) at 12/27/2017 0839 Last data filed at 12/27/2017 0441 Gross per 24 hour  Intake 1506.67 ml  Output 1400 ml  Net 106.67 ml   Filed Weights   12/25/17 1002 12/26/17 0409 12/27/17 0435  Weight: 96.6 kg  (213 lb) 98.2 kg (216 lb 7.9 oz) 101 kg (222 lb 10.6 oz)    Examination:   General: Not in pain or dyspnea, deconditioned Neurology: Awake, but somnolent, non focal  E ENT: mild pallor, no icterus, oral mucosa moist Cardiovascular: No JVD. S1-S2 present, rhythmic, no gallops, rubs, or murmurs. No lower extremity edema. Pulmonary: decreased breath sounds bilaterally at bases, poor inspiratory effort, no wheezing, rhonchi or rales. Gastrointestinal. Abdomen protuberant, no organomegaly, non tender, no rebound or guarding Skin. No rashes Musculoskeletal: no joint deformities     Data Reviewed: I have personally reviewed following labs and imaging studies  CBC: Recent Labs  Lab 12/24/17 12/25/17 1026 12/26/17 0554  WBC 21.7 16.3* 17.6*  NEUTROABS 20 14.8*  --   HGB 11.7* 13.0 11.6*  HCT 36* 39.7 35.5*  MCV  --  87.4 87.7  PLT 296 356 409   Basic Metabolic Panel: Recent Labs  Lab 12/24/17 12/25/17 1026 12/26/17 0554  NA 136* 135 141  K 4.2 4.4 3.8  CL  --  100* 109  CO2  --  22 21*  GLUCOSE  --  343* 151*  BUN 38* 37* 34*  CREATININE 1.3 1.43* 1.37*  CALCIUM  --  8.6* 8.4*  MG  --   --  1.5*  PHOS  --   --  2.1*   GFR: Estimated Creatinine Clearance: 51.2 mL/min (A) (by C-G formula based on SCr of 1.37 mg/dL (H)). Liver Function Tests: Recent Labs  Lab 12/25/17 1026  AST 113*  ALT 82*  ALKPHOS 108  BILITOT 0.8  PROT 7.2  ALBUMIN 2.1*   No results for input(s): LIPASE, AMYLASE in the last 168 hours. No results for input(s): AMMONIA in the last 168 hours. Coagulation Profile: Recent Labs  Lab 12/25/17 1121  INR 1.21   Cardiac Enzymes: Recent Labs  Lab 12/25/17 1122 12/25/17 1446  TROPONINI 0.03* 0.03*   BNP (last 3 results) No results for input(s): PROBNP in the last 8760 hours. HbA1C: No results for input(s): HGBA1C in the last 72 hours. CBG: Recent Labs  Lab 12/26/17 1732 12/26/17 2034 12/26/17 2311 12/27/17 0307 12/27/17 0800  GLUCAP  193* 215* 144* 138* 143*   Lipid Profile: No results for input(s): CHOL, HDL, LDLCALC, TRIG, CHOLHDL, LDLDIRECT in the last 72 hours. Thyroid Function Tests: No results for input(s): TSH, T4TOTAL, FREET4, T3FREE, THYROIDAB in the last 72 hours. Anemia Panel: No results for input(s): VITAMINB12, FOLATE, FERRITIN, TIBC, IRON, RETICCTPCT in the last 72 hours.    Radiology Studies: I have reviewed all of the imaging during this hospital visit personally     Scheduled Meds: . Chlorhexidine Gluconate Cloth  6 each Topical Daily  . insulin aspart  0-20 Units Subcutaneous Q4H  . insulin glargine  30 Units Subcutaneous QHS  . lactobacillus  1 g Oral Q breakfast  . sodium chloride flush  10-40 mL Intracatheter Q12H  . sodium chloride flush  5 mL Intracatheter Q12H   Continuous Infusions: . sodium chloride    . linezolid (ZYVOX) IV Stopped (12/26/17 2336)  . meropenem (MERREM) IV Stopped (12/26/17 2336)     LOS: 2 days  Bryan Goin Gerome Apley, MD [facility] Pager [phone]

## 2017-12-27 NOTE — Progress Notes (Signed)
Referring Physician(s): Toth,P  Supervising Physician: Sandi Mariscal  Patient Status:  [facility] - In-pt  Chief Complaint: Cholecystitis  Subjective: Patient still weak, lethargic; still has some left shoulder discomfort.  Has some tenderness to palpation right upper quadrant region as expected post drainage; family in room   Allergies: Patient has no known allergies.  Medications: Prior to Admission medications   Medication Sig Start Date End Date Taking? Authorizing Provider  acetaminophen (TYLENOL) 500 MG tablet Take 1,000 mg by mouth every 8 (eight) hours as needed for moderate pain.  09/28/17  Yes [provider]  albuterol (ACCUNEB) 0.63 MG/3ML nebulizer solution Take 3 mLs (0.63 mg total) by nebulization every 2 (two) hours as needed for wheezing or shortness of breath. 11/17/17  Yes Ghimire, Henreitta Leber, MD  albuterol (PROVENTIL) (2.5 MG/3ML) 0.083% nebulizer solution Take 3 mLs (2.5 mg total) by nebulization 3 (three) times daily. 11/17/17  Yes Ghimire, Henreitta Leber, MD  Amino Acids-Protein Hydrolys (FEEDING SUPPLEMENT, PRO-STAT SUGAR FREE 64,) LIQD Take 30 mLs 2 (two) times daily by mouth.   Yes [provider]  atorvastatin (LIPITOR) 10 MG tablet Take 10 mg by mouth at bedtime. 10/11/17  Yes [provider]  benzonatate (TESSALON) 200 MG capsule Take 1 capsule (200 mg total) by mouth 3 (three) times daily as needed for cough. 11/17/17  Yes Ghimire, Henreitta Leber, MD  ceFEPime (MAXIPIME) IVPB Inject 2 g into the vein every 12 (twelve) hours. 12/20/17 12/27/17 Yes [provider]  Cholecalciferol (VITAMIN D3) 10000 units TABS Take 1,000 Units by mouth daily.   Yes [provider]  docusate sodium (COLACE) 100 MG capsule Take 1 capsule (100 mg total) by mouth 2 (two) times daily. 04/10/17  Yes Reyne Dumas, MD  ferrous sulfate 325 (65 FE) MG tablet Take 325 mg by mouth daily with breakfast.    Yes [provider]  furosemide (LASIX) 40 MG tablet  Take 40 mg by mouth daily.    Yes [provider]  gabapentin (NEURONTIN) 300 MG capsule Take 300 mg by mouth 3 (three) times daily.    Yes [provider]  guaiFENesin (MUCINEX) 600 MG 12 hr tablet Take 600 mg by mouth 2 (two) times daily. X 7 days 12/20/17 12/27/17 Yes [provider]  insulin aspart (NOVOLOG) 100 UNIT/ML injection Inject 15 Units into the skin 3 (three) times daily with meals. Inject 11 units subcutaneously after meals 10/26/17  Yes [provider]  insulin glargine (LANTUS) 100 UNIT/ML injection Inject 57 Units into the skin at bedtime.    Yes [provider]  ipratropium-albuterol (DUONEB) 0.5-2.5 (3) MG/3ML SOLN Take 3 mLs by nebulization every 6 (six) hours as needed (sob and wheezing). X 3 days and every 6 hours as needed x 14 days  12/24/17 01/07/18 Yes [provider]  lisinopril (PRINIVIL,ZESTRIL) 5 MG tablet Take 5 mg by mouth daily.   Yes [provider]  metoprolol succinate (TOPROL-XL) 25 MG 24 hr tablet Take 25 mg by mouth daily.   Yes [provider]  Multiple Vitamin (MULTIVITAMIN) tablet Take 1 tablet by mouth daily.   Yes [provider]  Nutritional Supplements (NUTRITIONAL SHAKE PO) House Shake - Give 120cc by mouth three times daily   Yes [provider]  ondansetron (ZOFRAN) 4 MG tablet Take 4 mg by mouth every 6 (six) hours as needed for nausea or vomiting. 12/19/17  Yes [provider]  pantoprazole (PROTONIX) 40 MG tablet Take 40 mg by  mouth daily.   Yes [provider]  polyethylene glycol (MIRALAX / GLYCOLAX) packet Take 17 g by mouth 2 (two) times daily.    Yes [provider]  potassium chloride SA (K-DUR,KLOR-CON) 20 MEQ tablet Take 1 tablet (20 mEq total) by mouth daily. 11/18/17  Yes Ghimire, Henreitta Leber, MD  Probiotic Product (PROBIOTIC DAILY) CAPS Take 1 capsule by mouth daily. 12/21/17 01/20/18 Yes [provider]  promethazine (PHENERGAN)  25 MG suppository Place 25 mg rectally every 6 (six) hours as needed for nausea or vomiting. X 3 days 12/24/17 12/27/17 Yes [provider]  Skin Protectants, Misc. (CALAZIME SKIN PROTECTANT EX) Apply 1 application topically 2 (two) times daily. Apply to buttock every shift    Yes [provider]  Skin Protectants, Misc. (EUCERIN) cream Apply to BLE topically two times daily for dry skin   Yes [provider]  traMADol (ULTRAM) 50 MG tablet Take 25 mg by mouth every 12 (twelve) hours as needed for moderate pain.    Yes [provider]  traZODone (DESYREL) 100 MG tablet Take 100 mg by mouth at bedtime as needed for sleep.   Yes [provider]  LevoFLOXacin in D5W (LEVAQUIN IV) Inject 750 mg into the vein. Every other day for 7 administrations 12/21/17 01/04/18  [provider]  lisinopril (ZESTRIL) 2.5 MG tablet Take 2 tablets (5 mg total) by mouth daily. Patient not taking: Reported on 12/25/2017 11/17/17 11/17/18  Jonetta Osgood, MD     Vital Signs: BP 120/61   Pulse 92   Temp 97.6 F (36.4 C) (Axillary)   Resp (!) 22   Ht 5\' 11"  (1.803 m)   Wt 222 lb 10.6 oz (101 kg)   SpO2 96%   BMI 31.06 kg/m   Physical Exam gallbladder/upper abdominal drains intact, insertion sites mildly tender ,outputs 120/30 cc respectively  Imaging: Dg Chest 2 View  Result Date: 12/25/2017 CLINICAL DATA:  History pneumonia.  Cough and congestion. EXAM: CHEST - 2 VIEW COMPARISON:  11/14/2017. FINDINGS: Cardiomegaly with pulmonary venous congestion again noted. Bilateral interstitial prominence noted. Findings suggest CHF. Pneumonitis cannot be excluded. Small bilateral pleural effusions cannot be excluded. No pneumothorax. IMPRESSION: Cardiomegaly with pulmonary venous congestion bilateral interstitial prominence suggesting mild CHF. Small bilateral pleural effusions may be present. Electronically Signed   By: Marcello Moores  Register   On: 12/25/2017 10:38   Ct Head Wo  Contrast  Result Date: 12/25/2017 CLINICAL DATA:  84 year old male with altered mental status. Recent pneumonia. EXAM: CT HEAD WITHOUT CONTRAST TECHNIQUE: Contiguous axial images were obtained from the base of the skull through the vertex without intravenous contrast. COMPARISON:  Head CT without contrast 11/15/2017 and earlier. FINDINGS: Brain: Stable cerebral volume. Stable ventricle size and configuration since 2013. Small chronic lacunar infarct in the inferior left cerebellum is stable. Patchy supratentorial cerebral white matter hypodensity is stable. No midline shift, mass effect, evidence of mass lesion, intracranial hemorrhage or evidence of cortically based acute infarction. Vascular: Calcified atherosclerosis at the skull base. No suspicious intracranial vascular hyperdensity. Skull: No acute osseous abnormality identified. Sinuses/Orbits: Improved sinus aeration since January. Residual sphenoid sinus mucosal thickening. Bilateral tympanic cavities and mastoids are clear. Other: No acute orbit or scalp soft tissue findings. IMPRESSION: 1. No acute intracranial abnormality. Stable non contrast CT appearance of the brain since January. 2. Improved sinus aeration since January with residual sphenoid sinus disease. Electronically Signed   By: Genevie Ann M.D.   On: 12/25/2017 12:33  Nm Hepatobiliary Liver Func  Result Date: 12/26/2017 CLINICAL DATA:  Gallbladder wall thickening and sludge with right upper quadrant abdominal pain EXAM: NUCLEAR MEDICINE HEPATOBILIARY IMAGING TECHNIQUE: Sequential images of the abdomen were obtained out to 60 minutes following intravenous administration of radiopharmaceutical. RADIOPHARMACEUTICALS:  5.5 mCi Tc-26m  Choletec IV COMPARISON:  Abdominal ultrasound 12/25/2017 FINDINGS: Satisfactory uptake of radiopharmaceutical from the blood pool. Biliary and bowel activity by 5 minutes. Initial non filling of the gallbladder prompted IV administration of 4 mg of morphine.  Subsequent imaging over the next 30 minutes demonstrates vague activity in the area of the gallbladder fossa but no definitive gallbladder filling. IMPRESSION: 1. Lack of definitive filling of the gallbladder on hepatobiliary scan despite morphine administration. Appearance is suspicious for lack of patency of the cystic duct and cholecystitis. Electronically Signed   By: Van Clines M.D.   On: 12/26/2017 12:44   Ct Abdomen Pelvis W Contrast  Result Date: 12/25/2017 CLINICAL DATA:  Abdominal distention. EXAM: CT ABDOMEN AND PELVIS WITH CONTRAST TECHNIQUE: Multidetector CT imaging of the abdomen and pelvis was performed using the standard protocol following bolus administration of intravenous contrast. CONTRAST:  18mL ISOVUE-300 IOPAMIDOL (ISOVUE-300) INJECTION 61% COMPARISON:  02/23/2012 FINDINGS: Lower chest: Airspace opacities are identified within both lower lobes medially. Small right pleural effusion identified. Hepatobiliary: Low-attenuation fluid foci within the medial segment of left lobe of liver appear contiguous with the gallbladder, image 14/2. These measure up to 1.7 cm and are suspicious for small liver abscesses. Moderate distension of the gallbladder. There may be a stone within the gallbladder measuring approximately 2 cm. No biliary dilatation. Pancreas: Unremarkable. No pancreatic ductal dilatation or surrounding inflammatory changes. Spleen: Normal in size without focal abnormality. Adrenals/Urinary Tract: The adrenal glands appear normal. Unremarkable appearance of the kidneys. The urinary bladder is negative. Stomach/Bowel: The stomach is normal. No abnormal dilatation of the small bowel loops. The appendix is visualized and appears normal. No pathologic dilatation of the colon. Vascular/Lymphatic: Aortic atherosclerosis noted. No aneurysm. No upper abdominal adenopathy identified. No pelvic or inguinal adenopathy. Reproductive: Prostate is unremarkable. Other: Peripherally enhancing  fluid collection along the undersurface of the right lobe of liver measures 5.2 by 2.6 by 4.7 cm. Suspicious for subdiaphragmatic abscess. A small volume of perisplenic fluid is noted within the left upper quadrant of the abdomen between the spleen and left hemidiaphragm. Musculoskeletal: The bones appear diffusely osteopenic. There is degenerative disc disease identified within the lumbar spine. There is a compression fracture involving the L1 vertebra which is new from CT dated 02/23/2012. IMPRESSION: 1. Several low-attenuation foci within the medial segment of left lobe of liver appear contiguous with the gallbladder and are suspicious for small liver abscesses. 2. Given the suspicious changes within the medial segment of left lobe of liver cannot rule out gallbladder inflammation/infection. Less likely would be a gallbladder neoplasm which is invading the adjacent liver. As a next step, further evaluation with right upper quadrant gallbladder ultrasound is advised to further evaluate acute cholecystitis versus mass. 3. Sub diaphragmatic fluid collection is identified and is suspicious for abscess. 4. Small perisplenic fluid collection. Indeterminate. Cannot rule out abscess. 5. Bilateral lower lobe airspace densities compatible with pneumonia. 6.  Aortic Atherosclerosis (ICD10-I70.0). 7. These results were called by telephone at the time of interpretation on 12/25/2017 at 12:51 pm to Dr. Vanita Panda, who verbally acknowledged these results. Electronically Signed   By: Kerby Moors M.D.   On: 12/25/2017 12:52   Ir Perc Cholecystostomy  Result Date: 12/26/2017  INDICATION: Sepsis and imaging findings consistent with acute cholecystitis. The patient is not a candidate for cholecystectomy currently and request has been made to place a percutaneous cholecystostomy tube. There is an additional fluid collection anterior to the liver suspicious by CT for additional perihepatic abscess. EXAM: 1. PERCUTANEOUS  CHOLECYSTOSTOMY TUBE PLACEMENT 2. PERCUTANEOUS CATHETER DRAINAGE OF PERIHEPATIC PERITONEAL ABSCESS ANESTHESIA/SEDATION: Moderate (conscious) sedation was employed during this procedure. A total of Versed 2.0 mg and Fentanyl 100 mcg was administered intravenously. Moderate Sedation Time: 25 minutes. The patient's level of consciousness and vital signs were monitored continuously by radiology nursing throughout the procedure under my direct supervision. MEDICATIONS: No additional medications. FLUOROSCOPY TIME:  Fluoroscopy Time: 54 seconds.  29.3 mGy. COMPLICATIONS: None immediate. PROCEDURE: Informed written consent was obtained from the patient after a thorough discussion of the procedural risks, benefits and alternatives. All questions were addressed. Maximal Sterile Barrier Technique was utilized including caps, mask, sterile gowns, sterile gloves, sterile drape, hand hygiene and skin antiseptic. A timeout was performed prior to the initiation of the procedure. The gallbladder was localized by ultrasound. Under ultrasound guidance, a 21 gauge needle was advanced into the gallbladder lumen. Bile was aspirated from the gallbladder lumen. A small amount of contrast material was injected. A guidewire was advanced. A transitional dilator was placed. The tract was dilated and a 10 French percutaneous drainage catheter advanced. A bile sample was sent for culture analysis. The catheter was connected to gravity bag drainage. It was secured at the skin with a Prolene retention suture and StatLock device. The anterior perihepatic abscess was localized by ultrasound. Under ultrasound guidance, a 5 [place] Yueh centesis catheter was advanced into the collection. Aspiration was performed and a fluid sample sent for culture analysis. The catheter was removed over a guidewire. The tract was dilated and a 10 French percutaneous drainage catheter placed. The catheter was connected to suction bulb drainage. It was secured at the  skin with a Prolene retention suture and StatLock device. FINDINGS: By ultrasound the gallbladder is distended, contains multiple calculi and demonstrates wall thickening and inflammation. Aspiration yielded grossly purulent fluid from the gallbladder lumen. A 10 French cholecystostomy tube was placed and is draining well after placement. Aspiration at the level of the anterior perihepatic abscess yielded cloudy, thin yellow fluid. Given nature of fluid, an additional 10 [place] drain was placed in this collection. IMPRESSION: 1. Grossly purulent bile in the gallbladder lumen consistent with cholecystitis. A 10 French cholecystostomy tube was placed and attached to gravity bag drainage. 2. Anterior perihepatic fluid collection yielded cloudy yellow fluid. A 10 French percutaneous drainage catheter was placed in this collection and attached to suction bulb drainage. Electronically Signed   By: Aletta Edouard M.D.   On: 12/26/2017 17:32   Dg Chest Port 1 View  Result Date: 12/26/2017 CLINICAL DATA:  Cough and congestion EXAM: PORTABLE CHEST 1 VIEW COMPARISON:  December 25, 2016 FINDINGS: There is airspace consolidation in the left base and right mid lung regions. These changes are similar to 1 day prior. There appears to be a degree of underlying interstitial edema. There is cardiomegaly. The pulmonary vascularity is within normal limits. There is aortic atherosclerosis. No adenopathy. There is degenerative change in the thoracic spine. There is calcification in the right carotid artery. IMPRESSION: Cardiomegaly with underlying interstitial edema. Areas of airspace consolidation in the left base and right mid lung may represent alveolar edema or pneumonia. Both entities may be present concurrently. Appearance similar 1 day prior. There is  aortic atherosclerosis as well as calcification in right carotid artery. Aortic Atherosclerosis (ICD10-I70.0). Electronically Signed   By: Lowella Grip III M.D.   On:  12/26/2017 07:15   Ir Image Guided Drainage Percut Cath  Peritoneal Retroperit  Result Date: 12/26/2017 INDICATION: Sepsis and imaging findings consistent with acute cholecystitis. The patient is not a candidate for cholecystectomy currently and request has been made to place a percutaneous cholecystostomy tube. There is an additional fluid collection anterior to the liver suspicious by CT for additional perihepatic abscess. EXAM: 1. PERCUTANEOUS CHOLECYSTOSTOMY TUBE PLACEMENT 2. PERCUTANEOUS CATHETER DRAINAGE OF PERIHEPATIC PERITONEAL ABSCESS ANESTHESIA/SEDATION: Moderate (conscious) sedation was employed during this procedure. A total of Versed 2.0 mg and Fentanyl 100 mcg was administered intravenously. Moderate Sedation Time: 25 minutes. The patient's level of consciousness and vital signs were monitored continuously by radiology nursing throughout the procedure under my direct supervision. MEDICATIONS: No additional medications. FLUOROSCOPY TIME:  Fluoroscopy Time: 54 seconds.  29.3 mGy. COMPLICATIONS: None immediate. PROCEDURE: Informed written consent was obtained from the patient after a thorough discussion of the procedural risks, benefits and alternatives. All questions were addressed. Maximal Sterile Barrier Technique was utilized including caps, mask, sterile gowns, sterile gloves, sterile drape, hand hygiene and skin antiseptic. A timeout was performed prior to the initiation of the procedure. The gallbladder was localized by ultrasound. Under ultrasound guidance, a 21 gauge needle was advanced into the gallbladder lumen. Bile was aspirated from the gallbladder lumen. A small amount of contrast material was injected. A guidewire was advanced. A transitional dilator was placed. The tract was dilated and a 10 French percutaneous drainage catheter advanced. A bile sample was sent for culture analysis. The catheter was connected to gravity bag drainage. It was secured at the skin with a Prolene retention  suture and StatLock device. The anterior perihepatic abscess was localized by ultrasound. Under ultrasound guidance, a 5 [place] Yueh centesis catheter was advanced into the collection. Aspiration was performed and a fluid sample sent for culture analysis. The catheter was removed over a guidewire. The tract was dilated and a 10 French percutaneous drainage catheter placed. The catheter was connected to suction bulb drainage. It was secured at the skin with a Prolene retention suture and StatLock device. FINDINGS: By ultrasound the gallbladder is distended, contains multiple calculi and demonstrates wall thickening and inflammation. Aspiration yielded grossly purulent fluid from the gallbladder lumen. A 10 French cholecystostomy tube was placed and is draining well after placement. Aspiration at the level of the anterior perihepatic abscess yielded cloudy, thin yellow fluid. Given nature of fluid, an additional 10 [place] drain was placed in this collection. IMPRESSION: 1. Grossly purulent bile in the gallbladder lumen consistent with cholecystitis. A 10 French cholecystostomy tube was placed and attached to gravity bag drainage. 2. Anterior perihepatic fluid collection yielded cloudy yellow fluid. A 10 French percutaneous drainage catheter was placed in this collection and attached to suction bulb drainage. Electronically Signed   By: Aletta Edouard M.D.   On: 12/26/2017 17:32   US Abdomen Limited Ruq  Result Date: 12/25/2017 CLINICAL DATA:  Abnormal appearance of gallbladder on CT EXAM: ULTRASOUND ABDOMEN LIMITED RIGHT UPPER QUADRANT COMPARISON:  CT abdomen and pelvis December 25, 2017 FINDINGS: Gallbladder: Sludge is seen throughout the gallbladder. No well-defined gallstones are delineated by ultrasound. Gallbladder wall appears thickened. No pericholecystic fluid evident. No sonographic Murphy sign noted by sonographer. Common bile duct: Diameter: 3 mm. No intrahepatic or extrahepatic biliary duct dilatation.  Liver: Liver not completely visualized due  to shadowing from apparent gas. Liver echogenicity appears overall increased. No focal liver lesions are evident on this study. Portal vein is patent on color Doppler imaging with normal direction of blood flow towards the liver. IMPRESSION: 1. Gallbladder wall appears mildly thickened with sludge in the gallbladder. No gallstones evident. A degree of acalculus cholecystitis is questioned. In this regard, it may be prudent to consider nuclear medicine hepatobiliary imaging study to assess for cystic duct patency. 2. Portions of liver not well visualized due to apparent gas. Findings on CT in the periphery of the liver are not appreciable on this sonographic evaluation. The overall echogenicity of the visualized portions of liver appear increased which may indicate a degree of underlying hepatic steatosis. The sensitivity of ultrasound for detection of focal liver lesions is diminished significantly in this circumstance. Electronically Signed   By: Lowella Grip III M.D.   On: 12/25/2017 13:33    Labs:  CBC: Recent Labs    12/24/17 12/25/17 1026 12/26/17 0554 12/27/17 1115  WBC 21.7 16.3* 17.6* 18.7*  HGB 11.7* 13.0 11.6* 11.4*  HCT 36* 39.7 35.5* 34.8*  PLT 296 356 370 376    COAGS: Recent Labs    04/06/17 1636 11/15/17 0414 12/25/17 1121  INR 0.96 1.15 1.21  APTT  --  31  --     BMP: Recent Labs    11/17/17 0710 12/24/17 12/25/17 1026 12/26/17 0554 12/27/17 1115  NA 137 136* 135 141 138  K 3.5 4.2 4.4 3.8 4.0  CL 103  --  100* 109 106  CO2 26  --  22 21* 23  GLUCOSE 189*  --  343* 151* 274*  BUN 16 38* 37* 34* 30*  CALCIUM 8.5*  --  8.6* 8.4* 8.1*  CREATININE 1.15 1.3 1.43* 1.37* 1.28*  GFRNONAA 58*  --  44* 47* 51*  GFRAA >60  --  51* 54* 59*    LIVER FUNCTION TESTS: Recent Labs    11/14/17 2130 11/15/17 0414 11/16/17 0550 12/25/17 1026  BILITOT 0.6 0.8 0.5 0.8  AST 47* 31 53* 113*  ALT 36 29 45 82*  ALKPHOS 98  75 71 108  PROT 8.0 6.6 6.7 7.2  ALBUMIN 2.9* 2.4* 2.4* 2.1*    Assessment and Plan: Patient with history of acute cholecystitis/perihepatic fluid collection, status post percutaneous cholecystostomy and perihepatic fluid drainage on 12/26/17; afebrile, fluid cultures pending, creatinine 1.28, WBC 18.7 (17.6), hemoglobin 11.4; continue with drain irrigations, output monitoring; cholecystostomy drain will need to remain in place at least 4- 6 weeks unless surgery done in the interim; recommend follow-up CT within 1 week of drain placement to assess perihepatic fluid collection.   Electronically Signed: D. Rowe Robert, PA-C 12/27/2017, 4:29 PM   I spent a total of 15 minutes at the the patient's bedside AND on the patient's hospital floor or unit, greater than 50% of which was counseling/coordinating care for gallbladder and perihepatic fluid collection drainage    Patient ID: Preston Moore, male   DOB: 04-04-1934, 84 y.o.   MRN: [id number]

## 2017-12-27 NOTE — Progress Notes (Signed)
Palliative Medicine consult noted. Due to high referral volume, there may be a delay seeing this patient. Please call the Palliative Medicine Team office at [phone] if recommendations are needed in the interim.  Thank you for inviting [place] to see this patient.  Marjie Skiff Vincent Ehrler, RN, BSN, [facility] Palliative Medicine Team 12/27/2017 11:06 AM Office [phone]

## 2017-12-27 NOTE — Progress Notes (Signed)
84 year old nursing home resident, chronically bedbound after bilateral hip replacement about 2 years ago with bilateral heel ulcers and recent admission in February for influenza and new systolic heart failure with EF of 35% He was being treated at nursing facility with IV Levaquin/clindamycin and cefepime but continued to have tachycardia and soft blood pressure and fevers hence sent to the emergency room.  Found to have soft blood pressure, received 2 L fluid, lactate 4.3, with leukocytosis blood pressure improved with fluids CT abdomen showing subdiaphragmatic collection and liver abscess and ultrasound suggesting acalculus cholecystitis   He underwent IR guided drainage of subdiaphragmatic abscess and percutaneous cholecystostomy tube.  He had some runs of tachycardia overnight  Exam otherwise unchanged, still appears lethargic, weak, pain is improved  Blood cultures showing 1/2 MRSE, fluid cultures are pending.  Impression/plan Septic shock-resolving, continue meropenem and 2 fluid cultures back and then can simplify, vancomycin was discontinued, MRSE appears to be contaminant. Subdiaphragmatic abscess and a calculus cholecystitis have been drained Hypomagnesemia and hypophosphatemia repleted.  Tachycardia/history of atrial flutter-resume beta-blocker now that blood pressure improved  Daughters feel that he has not made much progress, clearly comfort is more primary here, after discussion with the daughter, will place consult for palliative care, care limitations already placed in terms of DNR [facility] to take over care, P CCM available as needed, can transfer to telemetry  Rakesh V. Elsworth Soho MD

## 2017-12-27 NOTE — Progress Notes (Signed)
Pharmacy Antibiotic Note  Preston Moore is a 84 y.o. male admitted on 12/25/2017 with sepsis.  Pharmacy has been consulted for Linezolid, meropenem dosing.  PMH is significant for recent/current broad spectrum antibiotic use including vancomycin, cefepime, and Levaquin for pneumonia.  Unclear current source of infection; multiple decubitus ulcers, abdominal tenderness (CT pending), CXR suggestive of mild CHF, and mall bilateral pleural effusions.  Today, 12/27/2017: SCr improved to 1.28, CrCl ~ 55 ml/min WBC remains elevated Afebrile  Plan:  Linezolid 600mg  IV q12h  Adjust Meropenem 1g IV q8h for improved renal fx  Follow up renal fxn, culture results, and clinical course.   Height: 5\' 11"  (180.3 cm) Weight: 222 lb 10.6 oz (101 kg) IBW/kg (Calculated) : 75.3  Temp (24hrs), Avg:98.5 F (36.9 C), Min:97.5 F (36.4 C), Max:99.1 F (37.3 C)  Recent Labs  Lab 12/24/17 12/25/17 1026 12/25/17 1029 12/25/17 1450 12/26/17 0554 12/27/17 1115  WBC 21.7 16.3*  --   --  17.6* 18.7*  CREATININE 1.3 1.43*  --   --  1.37* 1.28*  LATICACIDVEN  --   --  4.35* 2.1*  --   --     Estimated Creatinine Clearance: 54.8 mL/min (A) (by C-G formula based on SCr of 1.28 mg/dL (H)).    No Known Allergies  Antimicrobials this admission: 3/12 Linezolid >>  3/12 Meropenem >>   Dose adjustments this admission:  Microbiology results: 3/12 BCx: 1/2 BCID with Staph sp, Meth resistance (NP aware) 3/12 UCx: ngtd 3/13 perihepatic abscess: abundant WBC, rare GPC 3/13 GB bile: abundant WBC, few GPC, few GNR Previous 1/30 Influneza A: positive   Thank you for allowing pharmacy to be a part of this patient's care.  Doreene Eland, PharmD, BCPS.   Pager: [phone] 12/27/2017 12:07 PM

## 2017-12-27 NOTE — Progress Notes (Signed)
CC:   Liver abscess/cholecystitis  Subjective: Daughter doesn't see allot of difference.  He is taking full liquids and tolerating it.  Drains are OK, cholecystostomy is clear green this AM, liver drain is slightly cloudy clear fluid.  He isn't all that much more responsive or active this AM.  Objective: Vital signs in last 24 hours: Temp:  [97.5 F (36.4 C)-99.1 F (37.3 C)] 99.1 F (37.3 C) (03/14 0800) Pulse Rate:  [43-110] 98 (03/14 0800) Resp:  [15-35] 26 (03/14 0800) BP: (95-160)/(51-97) 143/83 (03/14 0800) SpO2:  [87 %-99 %] 95 % (03/14 0800) Weight:  [101 kg (222 lb 10.6 oz)] 101 kg (222 lb 10.6 oz) (03/14 0435) Last BM Date: 12/26/17 200 PO 1306 IV 1400 urine 150 from the drain TM 99.1 RR is better Intake/Output from previous day: 03/13 0701 - 03/14 0700 In: 1506.7 [P.O.:200; IV Piggyback:1306.7] Out: 1550 [Urine:1400; Drains:150] Intake/Output this shift: No intake/output data recorded.  General appearance: alert and no acute distress, still slow to respond to anything.   Resp: clear to auscultation bilaterally and anterior GI: soft, still tender, but not as bad, drains noted above, tolerating liquids  Lab Results:  Recent Labs    12/25/17 1026 12/26/17 0554  WBC 16.3* 17.6*  HGB 13.0 11.6*  HCT 39.7 35.5*  PLT 356 370    BMET Recent Labs    12/25/17 1026 12/26/17 0554  NA 135 141  K 4.4 3.8  CL 100* 109  CO2 22 21*  GLUCOSE 343* 151*  BUN 37* 34*  CREATININE 1.43* 1.37*  CALCIUM 8.6* 8.4*   PT/INR Recent Labs    12/25/17 1121  LABPROT 15.2  INR 1.21    Recent Labs  Lab 12/25/17 1026  AST 113*  ALT 82*  ALKPHOS 108  BILITOT 0.8  PROT 7.2  ALBUMIN 2.1*     Lipase     Component Value Date/Time   LIPASE 33 02/27/2012 0500     Medications: . Chlorhexidine Gluconate Cloth  6 each Topical Daily  . insulin aspart  0-20 Units Subcutaneous Q4H  . insulin glargine  15 Units Subcutaneous QHS  . lactobacillus  1 g Oral Q  breakfast  . metoprolol tartrate  12.5 mg Oral BID  . sodium chloride flush  10-40 mL Intracatheter Q12H  . sodium chloride flush  5 mL Intracatheter Q12H   . sodium chloride    . linezolid (ZYVOX) IV Stopped (12/26/17 2336)  . magnesium sulfate 1 - 4 g bolus IVPB    . meropenem (MERREM) IV Stopped (12/26/17 2336)   Anti-infectives (From admission, onward)   Start     Dose/Rate Route Frequency Ordered Stop   12/25/17 2200  linezolid (ZYVOX) IVPB 600 mg     600 mg 300 mL/hr over 60 Minutes Intravenous Every 12 hours 12/25/17 1133     12/25/17 2200  meropenem (MERREM) 1 g in sodium chloride 0.9 % 100 mL IVPB     1 g 200 mL/hr over 30 Minutes Intravenous Every 12 hours 12/25/17 1133     12/25/17 1115  linezolid (ZYVOX) IVPB 600 mg     600 mg 300 mL/hr over 60 Minutes Intravenous STAT 12/25/17 1108 12/25/17 1346   12/25/17 1115  meropenem (MERREM) 1 g in sodium chloride 0.9 % 100 mL IVPB     1 g 200 mL/hr over 30 Minutes Intravenous STAT 12/25/17 1108 12/25/17 1244      Assessment/Plan Bilateral pneumonia/atelectasis Acute kidney disease Systolic heart failure with an  EF 35%/Mild congestive failure Possible recurrent pneumonia Altered mental status-acute on chronic metabolic encephalopathy. Failure to thrive/significantly decreased mobility PAD with right medial heel ulcer  Sepsis/Lactic acidosis Bacteremia - MRSA Liver abscess-subdiaphragmatic fluid collection/possible acute cholecystitis IR perihepatic abscess drainage; percutaneous cholecystostomy 12/26/17  FEN:  IV fluids/full liquids ID:  Linezolid/Meropenem 3/12/ =>> day 3 DVT:  SCD -  Percutaneous drain yesterday Foley: In Follow up:  TBD  Plan:  Continue drain and IV antibiotics.  Medical management.          LOS: 2 days    Preston Moore 12/27/2017 [phone]

## 2017-12-28 DIAGNOSIS — L8990 Pressure ulcer of unspecified site, unspecified stage: Secondary | ICD-10-CM

## 2017-12-28 DIAGNOSIS — I5022 Chronic systolic (congestive) heart failure: Secondary | ICD-10-CM

## 2017-12-28 LAB — CBC WITH DIFFERENTIAL/PLATELET
BASOS ABS: 0 10*3/uL (ref 0.0–0.1)
BASOS PCT: 0 %
EOS PCT: 1 %
Eosinophils Absolute: 0.2 10*3/uL (ref 0.0–0.7)
HCT: 35.3 % — ABNORMAL LOW (ref 39.0–52.0)
Hemoglobin: 11.6 g/dL — ABNORMAL LOW (ref 13.0–17.0)
Lymphocytes Relative: 12 %
Lymphs Abs: 1.9 10*3/uL (ref 0.7–4.0)
MCH: 28.6 pg (ref 26.0–34.0)
MCHC: 32.9 g/dL (ref 30.0–36.0)
MCV: 86.9 fL (ref 78.0–100.0)
Monocytes Absolute: 0.6 10*3/uL (ref 0.1–1.0)
Monocytes Relative: 4 %
Neutro Abs: 13.3 10*3/uL — ABNORMAL HIGH (ref 1.7–7.7)
Neutrophils Relative %: 83 %
PLATELETS: 353 10*3/uL (ref 150–400)
RBC: 4.06 MIL/uL — AB (ref 4.22–5.81)
RDW: 15.3 % (ref 11.5–15.5)
WBC: 15.9 10*3/uL — AB (ref 4.0–10.5)

## 2017-12-28 LAB — BASIC METABOLIC PANEL
Anion gap: 9 (ref 5–15)
BUN: 23 mg/dL — AB (ref 6–20)
CALCIUM: 7.9 mg/dL — AB (ref 8.9–10.3)
CO2: 23 mmol/L (ref 22–32)
CREATININE: 1.13 mg/dL (ref 0.61–1.24)
Chloride: 104 mmol/L (ref 101–111)
GFR calc Af Amer: >60 mL/min (ref >60)
GFR, EST NON AFRICAN AMERICAN: 59 mL/min — AB (ref >60)
Glucose, Bld: 136 mg/dL — ABNORMAL HIGH (ref 65–99)
POTASSIUM: 3.5 mmol/L (ref 3.5–5.1)
SODIUM: 136 mmol/L (ref 135–145)

## 2017-12-28 LAB — GLUCOSE, CAPILLARY
GLUCOSE-CAPILLARY: 116 mg/dL — AB (ref 65–99)
GLUCOSE-CAPILLARY: 121 mg/dL — AB (ref 65–99)
Glucose-Capillary: 133 mg/dL — ABNORMAL HIGH (ref 65–99)
Glucose-Capillary: 189 mg/dL — ABNORMAL HIGH (ref 65–99)
Glucose-Capillary: 205 mg/dL — ABNORMAL HIGH (ref 65–99)
Glucose-Capillary: 207 mg/dL — ABNORMAL HIGH (ref 65–99)

## 2017-12-28 LAB — MAGNESIUM: Magnesium: 2 mg/dL (ref 1.7–2.4)

## 2017-12-28 MED ORDER — POTASSIUM CHLORIDE 20 MEQ PO PACK
40.0000 meq | PACK | Freq: Once | ORAL | Status: AC
  Administered 2017-12-28: 40 meq via ORAL
  Filled 2017-12-28: qty 2

## 2017-12-28 MED ORDER — PIPERACILLIN-TAZOBACTAM 3.375 G IVPB
3.3750 g | Freq: Three times a day (TID) | INTRAVENOUS | Status: DC
  Administered 2017-12-28 – 2017-12-30 (×6): 3.375 g via INTRAVENOUS
  Filled 2017-12-28 (×6): qty 50

## 2017-12-28 MED ORDER — METOPROLOL TARTRATE 25 MG PO TABS
25.0000 mg | ORAL_TABLET | Freq: Two times a day (BID) | ORAL | Status: DC
  Administered 2017-12-28 – 2018-01-04 (×12): 25 mg via ORAL
  Filled 2017-12-28 (×13): qty 1

## 2017-12-28 MED ORDER — MORPHINE SULFATE (PF) 4 MG/ML IV SOLN
1.0000 mg | INTRAVENOUS | Status: DC | PRN
  Administered 2017-12-28 – 2018-01-04 (×8): 1 mg via INTRAVENOUS
  Filled 2017-12-28 (×8): qty 1

## 2017-12-28 MED ORDER — CIPROFLOXACIN IN D5W 200 MG/100ML IV SOLN
200.0000 mg | Freq: Two times a day (BID) | INTRAVENOUS | Status: DC
  Filled 2017-12-28: qty 100

## 2017-12-28 NOTE — Progress Notes (Signed)
This RN was notified that pt had 12 beats VTach non-sustained.  This RN went to check on pt, pt was asymptompatic. This RN was called back in and pt c/o chest pain. EKG was performed. MD notified with results. Will continue to monitor.

## 2017-12-28 NOTE — Progress Notes (Signed)
Pharmacy Antibiotic Note  Preston Moore is a 84 y.o. male admitted on 12/25/2017 with sepsis.  Pharmacy has been consulted for Linezolid, meropenem dosing.  PMH is significant for recent/current broad spectrum antibiotic use including vancomycin, cefepime, Clindamycin and Levaquin for pneumonia.  Found to have acute cholecystitis with peri-hepatic fluid drain s/p percutaneous cholecystostomy and perihepatic fluid drainage.  He is clinically improving.  MD would like to de-escalate abx to Zosyn.   Today, 12/28/2017: SCr improved to 1.13, CrCl ~ 60 ml/min WBC remains elevated but trending down Afebrile  Plan:  Zosyn 3.375gm IV Q8h to be infused over 4hrs  No dose adjustments anticipated.  Pharmacy to sign off.  Will monitor peripherally via electronic surveillance software for renal fxn changes & any + cx data.  Please re-consult if other changes in clinical condition warrant re-evaluation.    Height: 5\' 11"  (180.3 cm) Weight: 216 lb 7.9 oz (98.2 kg) IBW/kg (Calculated) : 75.3  Temp (24hrs), Avg:98.7 F (37.1 C), Min:97.6 F (36.4 C), Max:99 F (37.2 C)  Recent Labs  Lab 12/24/17 12/25/17 1026 12/25/17 1029 12/25/17 1450 12/26/17 0554 12/27/17 1115 12/28/17 0339  WBC 21.7 16.3*  --   --  17.6* 18.7* 15.9*  CREATININE 1.3 1.43*  --   --  1.37* 1.28* 1.13  LATICACIDVEN  --   --  4.35* 2.1*  --   --   --     Estimated Creatinine Clearance: 61.3 mL/min (by C-G formula based on SCr of 1.13 mg/dL).    No Known Allergies  Antimicrobials this admission: 3/12 Linezolid >> 3/15 3/12 Meropenem >> 3/15 3/15 Zosyn>>  Dose adjustments this admission:  Microbiology results: 3/12 BCx: 1/2 BCID with Staph sp, Meth resistance (NP aware) 3/12 UCx: ngtd 3/13 perihepatic abscess: abundant WBC, rare GPC 3/13 GB bile: abundant WBC, few GPC, few GNR Previous 1/30 Influneza A: positive   Thank you for allowing pharmacy to be a part of this patient's care.  Netta Cedars, PharmD,  BCPS Pager: [phone] 12/28/2017 8:52 AM

## 2017-12-28 NOTE — Progress Notes (Signed)
    CC:  Liver abscess/cholecystitis    Subjective: He told me he feels like____.  No other significant change.  He is still rather lethargic, stares off and is pretty quiet in bed.    Objective: Vital signs in last 24 hours: Temp:  [97.6 F (36.4 C)-99 F (37.2 C)] 99 F (37.2 C) (03/15 0340) Pulse Rate:  [50-92] 89 (03/15 0800) Resp:  [18-28] 21 (03/15 0800) BP: (98-148)/(46-124) 116/46 (03/15 0800) SpO2:  [87 %-97 %] 94 % (03/15 0800) Weight:  [98.2 kg (216 lb 7.9 oz)] 98.2 kg (216 lb 7.9 oz) (03/15 0500) Last BM Date: 12/26/17 180 PO recorded 950 IV 1250 urine Drains 135 Afebrile, Temps are axillary RR 20's, some tachycardia BP 06-237'S systolic, sats OK on room air now WBC still up 15.9  Intake/Output from previous day: 03/14 0701 - 03/15 0700 In: 1135 [P.O.:180; IV Piggyback:950] Out: 2831 [Urine:1250; Drains:135] Intake/Output this shift: No intake/output data recorded.  General appearance: alert and no distress GI: soft, still pretty tender to plapation, not much in the drain this Am, what is there is clear.  chole drain green and clear.  Lab Results:  Recent Labs    12/27/17 1115 12/28/17 0339  WBC 18.7* 15.9*  HGB 11.4* 11.6*  HCT 34.8* 35.3*  PLT 376 353    BMET Recent Labs    12/27/17 1115 12/28/17 0339  NA 138 136  K 4.0 3.5  CL 106 104  CO2 23 23  GLUCOSE 274* 136*  BUN 30* 23*  CREATININE 1.28* 1.13  CALCIUM 8.1* 7.9*   PT/INR Recent Labs    12/25/17 1121  LABPROT 15.2  INR 1.21    Recent Labs  Lab 12/25/17 1026  AST 113*  ALT 82*  ALKPHOS 108  BILITOT 0.8  PROT 7.2  ALBUMIN 2.1*     Lipase     Component Value Date/Time   LIPASE 33 02/27/2012 0500     Medications: . insulin aspart  0-20 Units Subcutaneous Q4H  . insulin glargine  15 Units Subcutaneous QHS  . lactobacillus  1 g Oral Q breakfast  . metoprolol tartrate  12.5 mg Oral BID  . sodium chloride flush  5 mL Intracatheter Q12H     Assessment/Plan Bilateral pneumonia/atelectasis Acute kidney disease Systolic heart failure with an EF 35%/Mild congestive failure Possible recurrent pneumonia Altered mental status-acute on chronic metabolic encephalopathy. Failure to thrive/significantly decreased mobility PAD with right medial heel ulcer  Sepsis/Lactic acidosis Bacteremia - MRSA Liver abscess-subdiaphragmatic fluid collection/possible acute cholecystitis IR perihepatic abscess drainage; percutaneous cholecystostomy 12/26/17  - drain cultures still pending  FEN:  IV fluids/full liquids ID: Linezolid/Meropenem 3/12/ =>>day 3 DVT: SCD - He can have DVT prophylaxis from our standpoint Foley: In Follow up: Dr. Marlou Starks after drains are removed about 6 weeks   Plan:  Medical management, no surgery currently planned he can follow up after drains are removed.          LOS: 3 days    Marcelina Mclaurin 12/28/2017 [phone]

## 2017-12-28 NOTE — Progress Notes (Signed)
Attempted to call report to receiving RN on [address] [place]. Was told to call back, due to RN being in shift huddle.

## 2017-12-28 NOTE — Progress Notes (Signed)
2nd attempt to call RN Ify receiving pt on [address] [place] at 1233. RN stated she would return my call after giving meds.

## 2017-12-28 NOTE — Progress Notes (Signed)
Patient alert but confuse.  MASD over buttocks, right heel unstable ulcer on right mid heel and right lateral heel, left deep tissue injury barrier cream and foam dressing applied. Also Heels floated and foam dressing applied and also wound nurse consulted.

## 2017-12-28 NOTE — Progress Notes (Signed)
PROGRESS NOTE    Preston Moore  ZOX:[id number] DOB: [birth year] DOA: 12/25/2017 PCP: Gildardo Cranker, DO    Brief Narrative:  84 year old male who presented with tachycardia.  Patient does have a significant past medical history of systolic heart failure, bilateral hip replacement about 2 years ago, bedbound and history of bilateral heel ulcers.  He is a nursing home resident, he was noted to have significant tachycardia, fever and hypotension.  On the initial physical examination blood pressure 109/69, heart rate 110, temperature 98.2, respiratory 26, dry mucous membranes, lungs were clear to auscultation bilaterally, decreased at bases, heart S1-S2 present, rhythmic, abdomen was soft nontender, no lower extremity edema.  Sodium 135, potassium 4.4, chloride 100, bicarb 22, glucose 343, BUN 37, creatinine 1.43, venous lactic acid 4.3, white count 16.3, hemoglobin 13.0, hematocrit 39.7, platelets 356, urine analysis 50 glucose, pH 5.0, protein 30, specific gravity 1.028, RBC 0-5, white cells 0-5, positive granular and hyaline casts.  Abdominal CT with several low-attenuation foci within the medial segment of the left lobe of the liver, contiguous to the gallbladder, suspicion for small liver abscess, subdiaphragmatic fluid collection is identified as suspicious for abscess.  Chest x-ray hypoinflated, left rotation, increased interstitial markings bilaterally.  EKG atrial fibrillation, poor baseline, 131 bpm, normal axis.  Patient was admitted to the hospital with a working diagnosis of sepsis due to liver abscess, acalculous cholecystitis, complicated by acute kidney injury in atrial fibrillation with rapid ventricular response.   Assessment & Plan:   Active Problems:   Septic shock (HCC)   Protein-calorie malnutrition, severe   1. Resolving sepsis due to acalculous cholecystitis and liver abscess.  Blood culture with coagulase negative staphylococcus, will repeat blood cultures and will narrow  antibiotic therapy to IV Zosyn, will continue to follow on cell count, cultures and temperature curve. Biliary drain in place. Pain control with morphine, dc fentanyl to decrease risk of oversedation.   2. Atrial fibrillation and flutter. Intermittent tachycardia, will increase dose of metoprolol to 25 mg po bid, will continue on telemetry monitoring. Clinically euvolemic.    3. Systolic heart failure, EF 30 to 35% (with no decompensation) Increase metoprolol to 25 mg po bid, continue telemetry monitoring. Had 12 beats of VT, likely due to reduced LV systolic function, will keep K at 4 and Mg at 2. No signs of hypervolemia.   4. AKI. Continue to improve renal function with serum cr down to 1,13, K at 3,5, and serum bicarb at 23, will follow on renal panel in am, avoid hypotension or nephrotoxic medications.    5. HTN. Off antihypertensive medications.   6. COPD. No signs of exacerbation, will continue oxymetry monitoring and as needed supplemental 02 per [place]. .   7. T2DM. Gucose cover and monitoring with insulin sliding scale. Fasting glucose 136.   DVT prophylaxis: scd  Code Status:  dnr Family Communication: I spoke with patient's family at the bedside and all questions were addressed.  Disposition Plan: snf   Consultants:     Procedures:     Antimicrobials:  Zosyn IV    Subjective: Patient unable to sleep well last night, poor appetite, positive pain at the right upper quadrant, no nausea or vomiting.   Objective: Vitals:   12/28/17 0500 12/28/17 0600 12/28/17 0700 12/28/17 0800  BP:  (!) 141/124 119/81 (!) 116/46  Pulse:  91 83 89  Resp:  20 20 (!) 21  Temp:      TempSrc:      SpO2:  95%  95% 94%  Weight: 98.2 kg (216 lb 7.9 oz)     Height:        Intake/Output Summary (Last 24 hours) at 12/28/2017 0829 Last data filed at 12/28/2017 0535 Gross per 24 hour  Intake 1135 ml  Output 1385 ml  Net -250 ml   Filed Weights   12/26/17 0409 12/27/17 0435  12/28/17 0500  Weight: 98.2 kg (216 lb 7.9 oz) 101 kg (222 lb 10.6 oz) 98.2 kg (216 lb 7.9 oz)    Examination:   General: Not in pain or dyspnea, deconditioned Neurology: Awake and alert, positive confusion, non focal  E ENT: mild pallor, no icterus, oral mucosa moist Cardiovascular: No JVD. S1-S2 present, rhythmic, no gallops, rubs, or murmurs. No lower extremity edema. Pulmonary: decreased breath sounds bilaterally at bases, adequate air movement, no wheezing, rhonchi or rales. Gastrointestinal. Abdomen protuberant, no organomegaly, non tender, no rebound or guarding. Drain in place at the right upper quadrant.  Skin. No rashes Musculoskeletal: no joint deformities     Data Reviewed: I have personally reviewed following labs and imaging studies  CBC: Recent Labs  Lab 12/24/17 12/25/17 1026 12/26/17 0554 12/27/17 1115 12/28/17 0339  WBC 21.7 16.3* 17.6* 18.7* 15.9*  NEUTROABS 20 14.8*  --   --  13.3*  HGB 11.7* 13.0 11.6* 11.4* 11.6*  HCT 36* 39.7 35.5* 34.8* 35.3*  MCV  --  87.4 87.7 86.8 86.9  PLT 296 356 370 376 350   Basic Metabolic Panel: Recent Labs  Lab 12/24/17 12/25/17 1026 12/26/17 0554 12/27/17 1115 12/28/17 0339  NA 136* 135 141 138 136  K 4.2 4.4 3.8 4.0 3.5  CL  --  100* 109 106 104  CO2  --  22 21* 23 23  GLUCOSE  --  343* 151* 274* 136*  BUN 38* 37* 34* 30* 23*  CREATININE 1.3 1.43* 1.37* 1.28* 1.13  CALCIUM  --  8.6* 8.4* 8.1* 7.9*  MG  --   --  1.5* 2.0 2.0  PHOS  --   --  2.1* 2.2*  --    GFR: Estimated Creatinine Clearance: 61.3 mL/min (by C-G formula based on SCr of 1.13 mg/dL). Liver Function Tests: Recent Labs  Lab 12/25/17 1026  AST 113*  ALT 82*  ALKPHOS 108  BILITOT 0.8  PROT 7.2  ALBUMIN 2.1*   No results for input(s): LIPASE, AMYLASE in the last 168 hours. No results for input(s): AMMONIA in the last 168 hours. Coagulation Profile: Recent Labs  Lab 12/25/17 1121  INR 1.21   Cardiac Enzymes: Recent Labs  Lab  12/25/17 1122 12/25/17 1446  TROPONINI 0.03* 0.03*   BNP (last 3 results) No results for input(s): PROBNP in the last 8760 hours. HbA1C: No results for input(s): HGBA1C in the last 72 hours. CBG: Recent Labs  Lab 12/27/17 1514 12/27/17 1949 12/27/17 2355 12/28/17 0338 12/28/17 0742  GLUCAP 345* 225* 205* 116* 121*   Lipid Profile: No results for input(s): CHOL, HDL, LDLCALC, TRIG, CHOLHDL, LDLDIRECT in the last 72 hours. Thyroid Function Tests: No results for input(s): TSH, T4TOTAL, FREET4, T3FREE, THYROIDAB in the last 72 hours. Anemia Panel: No results for input(s): VITAMINB12, FOLATE, FERRITIN, TIBC, IRON, RETICCTPCT in the last 72 hours.    Radiology Studies: I have reviewed all of the imaging during this hospital visit personally     Scheduled Meds: . insulin aspart  0-20 Units Subcutaneous Q4H  . insulin glargine  15 Units Subcutaneous QHS  . lactobacillus  1 g  Oral Q breakfast  . metoprolol tartrate  12.5 mg Oral BID  . sodium chloride flush  5 mL Intracatheter Q12H   Continuous Infusions: . sodium chloride    . linezolid (ZYVOX) IV Stopped (12/27/17 2309)  . meropenem (MERREM) IV Stopped (12/28/17 0530)     LOS: 3 days        Elsia Lasota Gerome Apley, MD [facility] Pager [phone]

## 2017-12-29 DIAGNOSIS — Z515 Encounter for palliative care: Secondary | ICD-10-CM

## 2017-12-29 DIAGNOSIS — Z7189 Other specified counseling: Secondary | ICD-10-CM

## 2017-12-29 DIAGNOSIS — K659 Peritonitis, unspecified: Secondary | ICD-10-CM

## 2017-12-29 LAB — CBC WITH DIFFERENTIAL/PLATELET
Basophils Absolute: 0 10*3/uL (ref 0.0–0.1)
Basophils Relative: 0 %
EOS PCT: 1 %
Eosinophils Absolute: 0.1 10*3/uL (ref 0.0–0.7)
HEMATOCRIT: 36 % — AB (ref 39.0–52.0)
Hemoglobin: 11.4 g/dL — ABNORMAL LOW (ref 13.0–17.0)
LYMPHS ABS: 2.2 10*3/uL (ref 0.7–4.0)
Lymphocytes Relative: 16 %
MCH: 27.9 pg (ref 26.0–34.0)
MCHC: 31.7 g/dL (ref 30.0–36.0)
MCV: 88 fL (ref 78.0–100.0)
MONO ABS: 0.9 10*3/uL (ref 0.1–1.0)
Monocytes Relative: 7 %
NEUTROS ABS: 10.3 10*3/uL — AB (ref 1.7–7.7)
Neutrophils Relative %: 76 %
Platelets: 409 10*3/uL — ABNORMAL HIGH (ref 150–400)
RBC: 4.09 MIL/uL — AB (ref 4.22–5.81)
RDW: 15.5 % (ref 11.5–15.5)
WBC: 13.5 10*3/uL — ABNORMAL HIGH (ref 4.0–10.5)

## 2017-12-29 LAB — GLUCOSE, CAPILLARY
GLUCOSE-CAPILLARY: 143 mg/dL — AB (ref 65–99)
Glucose-Capillary: 229 mg/dL — ABNORMAL HIGH (ref 65–99)
Glucose-Capillary: 166 mg/dL — ABNORMAL HIGH (ref 65–99)
Glucose-Capillary: 156 mg/dL — ABNORMAL HIGH (ref 65–99)
Glucose-Capillary: 196 mg/dL — ABNORMAL HIGH (ref 65–99)
Glucose-Capillary: 235 mg/dL — ABNORMAL HIGH (ref 65–99)

## 2017-12-29 LAB — BASIC METABOLIC PANEL
ANION GAP: 8 (ref 5–15)
BUN: 20 mg/dL (ref 6–20)
CO2: 26 mmol/L (ref 22–32)
Calcium: 8 mg/dL — ABNORMAL LOW (ref 8.9–10.3)
Chloride: 104 mmol/L (ref 101–111)
Creatinine, Ser: 1 mg/dL (ref 0.61–1.24)
GFR calc Af Amer: >60 mL/min (ref >60)
GLUCOSE: 168 mg/dL — AB (ref 65–99)
POTASSIUM: 4 mmol/L (ref 3.5–5.1)
Sodium: 138 mmol/L (ref 135–145)

## 2017-12-29 MED ORDER — INSULIN ASPART 100 UNIT/ML ~~LOC~~ SOLN
0.0000 [IU] | Freq: Three times a day (TID) | SUBCUTANEOUS | Status: DC
  Administered 2017-12-29: 7 [IU] via SUBCUTANEOUS
  Administered 2017-12-30 (×2): 4 [IU] via SUBCUTANEOUS
  Administered 2017-12-30: 7 [IU] via SUBCUTANEOUS
  Administered 2017-12-30: 4 [IU] via SUBCUTANEOUS
  Administered 2017-12-31: 15 [IU] via SUBCUTANEOUS
  Administered 2017-12-31: 7 [IU] via SUBCUTANEOUS
  Administered 2017-12-31: 15 [IU] via SUBCUTANEOUS
  Administered 2017-12-31: 5 [IU] via SUBCUTANEOUS
  Administered 2018-01-01: 7 [IU] via SUBCUTANEOUS
  Administered 2018-01-01: 4 [IU] via SUBCUTANEOUS
  Administered 2018-01-01: 11 [IU] via SUBCUTANEOUS
  Administered 2018-01-01 – 2018-01-02 (×2): 7 [IU] via SUBCUTANEOUS
  Administered 2018-01-02 (×2): 4 [IU] via SUBCUTANEOUS
  Administered 2018-01-02 – 2018-01-03 (×3): 7 [IU] via SUBCUTANEOUS
  Administered 2018-01-03 (×2): 3 [IU] via SUBCUTANEOUS
  Administered 2018-01-04: 4 [IU] via SUBCUTANEOUS
  Administered 2018-01-04: 3 [IU] via SUBCUTANEOUS

## 2017-12-29 MED ORDER — OLANZAPINE 5 MG PO TABS
5.0000 mg | ORAL_TABLET | Freq: Every day | ORAL | Status: DC
  Administered 2017-12-29 – 2018-01-04 (×6): 5 mg via ORAL
  Filled 2017-12-29 (×6): qty 1

## 2017-12-29 NOTE — Progress Notes (Signed)
PROGRESS NOTE    Preston Moore  WUJ:[id number] DOB: 08/22/1934 DOA: 12/25/2017 PCP: Gildardo Cranker, DO    Brief Narrative:  84 year old male who presented with tachycardia. Patient does have a significant past medical history of systolic heart failure, bilateral hip replacement about 2 years ago, bedbound and history of bilateral heel ulcers. He is a nursing home resident, he was noted to have significant tachycardia, fever and hypotension. On the initial physical examination blood pressure 109/69, heart rate 110, temperature 98.2, respiratory 26, dry mucous membranes, lungs were clear to auscultation bilaterally, decreased at bases, heart S1-S2 present, rhythmic, abdomen was soft nontender, no lower extremity edema.Sodium 135, potassium 4.4, chloride 100, bicarb 22, glucose 343, BUN 37, creatinine 1.43, venous lactic acid 4.3, white count 16.3, hemoglobin 13.0, hematocrit 39.7, platelets 356,urine analysis 50 glucose, pH 5.0, protein 30, specific gravity 1.028, RBC 0-5, white cells 0-5,positive granular and hyaline casts.Abdominal CT with several low-attenuation foci within the medial segment of the left lobe of the liver, contiguous to the gallbladder, suspicion for small liver abscess, subdiaphragmatic fluid collection is identified as suspicious for abscess.Chest x-ray hypoinflated, left rotation, increased interstitial markings bilaterally.EKG atrial fibrillation, poor baseline, 131 bpm, normal axis.  Patient was admitted to the hospital with a working diagnosis of sepsis due to liver abscess, acalculous cholecystitis,complicated by acute kidney injury in atrial fibrillation with rapid ventricular response.   Assessment & Plan:   Active Problems:   Septic shock (HCC)   Protein-calorie malnutrition, severe   Pressure injury of skin   1. Resolving sepsis due to acalculous cholecystitis and liver abscess.  Will continue  IV Zosyn for antibiotic therapy, Biliary drain and  perihepatic drain in place, working well.  Continue pain control with morphine. Will continue to advance diet and will consult nutrition for assessment.    2. Atrial fibrillation and flutter. Tolerating well metoprolol to 25 mg po bid. Continue telemetry monitoring. Holding on anticoagulation for now.   3. Systolic heart failure, EF 30 to 35% (with no decompensation) No further arrhythmias reported, continue metoprolol. Holding maintenance IV fluids, will continue blood pressure monitoring. Holding on ace inh due to risk of hypotension.   4. AKI. Serum cr down to 1.0 with K at 4,0. Encourage po intake, holding IV fluids, patient clinically euvolemic.   5. HTN. Continue blood pressure monitoring.    6. COPD. No clinical signs of exacerbation.  7. T2DM. Continue glucose cover and monitoring with insulin sliding scale. Capillary glucose 207, 229, 166, 143, 156.  DVT prophylaxis:scd Code Status:dnr Family Communication:I spoke with patient's family at the bedside and all questions were addressed.  Disposition Plan:snf   Consultants:    Procedures:    Antimicrobials: Zosyn IV    Subjective: Patient very weak and deconditioned, no nausea or vomiting, no significant abdominal pain, no dyspnea or chest pain. Poor appetite.   Objective: Vitals:   12/28/17 1452 12/28/17 2128 12/28/17 2140 12/29/17 0609  BP: (!) 141/68  138/63 115/65  Pulse: 99 (!) 103 (!) 105 91  Resp: 20  20 20   Temp: 98.4 F (36.9 C)  98.1 F (36.7 C) 98.8 F (37.1 C)  TempSrc: Oral  Oral Oral  SpO2: 91%  95% 95%  Weight:    99.7 kg (219 lb 12.8 oz)  Height:        Intake/Output Summary (Last 24 hours) at 12/29/2017 0914 Last data filed at 12/29/2017 0617 Gross per 24 hour  Intake 270 ml  Output 980 ml  Net -710 ml  Filed Weights   12/27/17 0435 12/28/17 0500 12/29/17 0609  Weight: 101 kg (222 lb 10.6 oz) 98.2 kg (216 lb 7.9 oz) 99.7 kg (219 lb 12.8 oz)    Examination:    General: Not in pain or dyspnea, deconditioned Neurology: Awake and alert, non focal  E ENT: mild pallor, no icterus, oral mucosa moist Cardiovascular: No JVD. S1-S2 present, rhythmic, no gallops, rubs, or murmurs. No lower extremity edema. Pulmonary: decreased breath sounds bilaterally, adequate air movement, no wheezing, rhonchi or rales. Gastrointestinal. Abdomen protuberant, no organomegaly, non tender, no rebound or guarding. Right upper quadrant drain in place.  Skin. No rashes Musculoskeletal: no joint deformities     Data Reviewed: I have personally reviewed following labs and imaging studies  CBC: Recent Labs  Lab 12/24/17 12/25/17 1026 12/26/17 0554 12/27/17 1115 12/28/17 0339  WBC 21.7 16.3* 17.6* 18.7* 15.9*  NEUTROABS 20 14.8*  --   --  13.3*  HGB 11.7* 13.0 11.6* 11.4* 11.6*  HCT 36* 39.7 35.5* 34.8* 35.3*  MCV  --  87.4 87.7 86.8 86.9  PLT 296 356 370 376 193   Basic Metabolic Panel: Recent Labs  Lab 12/25/17 1026 12/26/17 0554 12/27/17 1115 12/28/17 0339 12/29/17 0437  NA 135 141 138 136 138  K 4.4 3.8 4.0 3.5 4.0  CL 100* 109 106 104 104  CO2 22 21* 23 23 26   GLUCOSE 343* 151* 274* 136* 168*  BUN 37* 34* 30* 23* 20  CREATININE 1.43* 1.37* 1.28* 1.13 1.00  CALCIUM 8.6* 8.4* 8.1* 7.9* 8.0*  MG  --  1.5* 2.0 2.0  --   PHOS  --  2.1* 2.2*  --   --    GFR: Estimated Creatinine Clearance: 69.7 mL/min (by C-G formula based on SCr of 1 mg/dL). Liver Function Tests: Recent Labs  Lab 12/25/17 1026  AST 113*  ALT 82*  ALKPHOS 108  BILITOT 0.8  PROT 7.2  ALBUMIN 2.1*   No results for input(s): LIPASE, AMYLASE in the last 168 hours. No results for input(s): AMMONIA in the last 168 hours. Coagulation Profile: Recent Labs  Lab 12/25/17 1121  INR 1.21   Cardiac Enzymes: Recent Labs  Lab 12/25/17 1122 12/25/17 1446  TROPONINI 0.03* 0.03*   BNP (last 3 results) No results for input(s): PROBNP in the last 8760 hours. HbA1C: No results  for input(s): HGBA1C in the last 72 hours. CBG: Recent Labs  Lab 12/28/17 1628 12/28/17 2128 12/29/17 0059 12/29/17 0607 12/29/17 0743  GLUCAP 189* 207* 229* 166* 143*   Lipid Profile: No results for input(s): CHOL, HDL, LDLCALC, TRIG, CHOLHDL, LDLDIRECT in the last 72 hours. Thyroid Function Tests: No results for input(s): TSH, T4TOTAL, FREET4, T3FREE, THYROIDAB in the last 72 hours. Anemia Panel: No results for input(s): VITAMINB12, FOLATE, FERRITIN, TIBC, IRON, RETICCTPCT in the last 72 hours.    Radiology Studies: I have reviewed all of the imaging during this hospital visit personally     Scheduled Meds: . insulin aspart  0-20 Units Subcutaneous Q4H  . insulin glargine  15 Units Subcutaneous QHS  . lactobacillus  1 g Oral Q breakfast  . metoprolol tartrate  25 mg Oral BID  . sodium chloride flush  5 mL Intracatheter Q12H   Continuous Infusions: . sodium chloride    . piperacillin-tazobactam (ZOSYN)  IV 3.375 g (12/29/17 0617)     LOS: 4 days        Talajah Slimp Gerome Apley, MD [facility] Pager [phone]

## 2017-12-29 NOTE — Plan of Care (Signed)
  Progressing Clinical Measurements: Ability to maintain clinical measurements within normal limits will improve 12/29/2017 1553 - Progressing by Adam Demary, Royetta Crochet, RN

## 2017-12-29 NOTE — Progress Notes (Signed)
CSW following to assist with discharge planning. Patient from [facility] ([place], [facility] term care resident. Patient currently not medically stable discharge. CSW will continue to follow and assist with discharge planning.  Abundio Miu, [place] Social Worker [facility] Cell#: [phone]

## 2017-12-29 NOTE — Progress Notes (Signed)
Referring Physician(s): CCS Dr Harlow Asa  Supervising Physician: Daryll Brod  Patient Status:  [facility] - In-pt  Chief Complaint:  Cholecystitis Perihepatic abscess  Subjective:  Perc chole drain and perihepatic drain placed 3/13  Seems restful; pleasant No complaints OP from GB- bilious  Perihepatic abscess drain OP yellow;thick  Allergies: Patient has no known allergies.  Medications: Prior to Admission medications   Medication Sig Start Date End Date Taking? Authorizing Provider  acetaminophen (TYLENOL) 500 MG tablet Take 1,000 mg by mouth every 8 (eight) hours as needed for moderate pain.  09/28/17  Yes [provider]  albuterol (ACCUNEB) 0.63 MG/3ML nebulizer solution Take 3 mLs (0.63 mg total) by nebulization every 2 (two) hours as needed for wheezing or shortness of breath. 11/17/17  Yes Ghimire, Henreitta Leber, MD  albuterol (PROVENTIL) (2.5 MG/3ML) 0.083% nebulizer solution Take 3 mLs (2.5 mg total) by nebulization 3 (three) times daily. 11/17/17  Yes Ghimire, Henreitta Leber, MD  Amino Acids-Protein Hydrolys (FEEDING SUPPLEMENT, PRO-STAT SUGAR FREE 64,) LIQD Take 30 mLs 2 (two) times daily by mouth.   Yes [provider]  atorvastatin (LIPITOR) 10 MG tablet Take 10 mg by mouth at bedtime. 10/11/17  Yes [provider]  benzonatate (TESSALON) 200 MG capsule Take 1 capsule (200 mg total) by mouth 3 (three) times daily as needed for cough. 11/17/17  Yes Ghimire, Henreitta Leber, MD  Cholecalciferol (VITAMIN D3) 10000 units TABS Take 1,000 Units by mouth daily.   Yes [provider]  docusate sodium (COLACE) 100 MG capsule Take 1 capsule (100 mg total) by mouth 2 (two) times daily. 04/10/17  Yes Reyne Dumas, MD  ferrous sulfate 325 (65 FE) MG tablet Take 325 mg by mouth daily with breakfast.    Yes [provider]  furosemide (LASIX) 40 MG tablet Take 40 mg by mouth daily.    Yes [provider]  gabapentin (NEURONTIN) 300 MG capsule Take 300  mg by mouth 3 (three) times daily.    Yes [provider]  insulin aspart (NOVOLOG) 100 UNIT/ML injection Inject 15 Units into the skin 3 (three) times daily with meals. Inject 11 units subcutaneously after meals 10/26/17  Yes [provider]  insulin glargine (LANTUS) 100 UNIT/ML injection Inject 57 Units into the skin at bedtime.    Yes [provider]  ipratropium-albuterol (DUONEB) 0.5-2.5 (3) MG/3ML SOLN Take 3 mLs by nebulization every 6 (six) hours as needed (sob and wheezing). X 3 days and every 6 hours as needed x 14 days  12/24/17 01/07/18 Yes [provider]  lisinopril (PRINIVIL,ZESTRIL) 5 MG tablet Take 5 mg by mouth daily.   Yes [provider]  metoprolol succinate (TOPROL-XL) 25 MG 24 hr tablet Take 25 mg by mouth daily.   Yes [provider]  Multiple Vitamin (MULTIVITAMIN) tablet Take 1 tablet by mouth daily.   Yes [provider]  Nutritional Supplements (NUTRITIONAL SHAKE PO) House Shake - Give 120cc by mouth three times daily   Yes [provider]  ondansetron (ZOFRAN) 4 MG tablet Take 4 mg by mouth every 6 (six) hours as needed for nausea or vomiting. 12/19/17  Yes [provider]  pantoprazole (PROTONIX) 40 MG tablet Take 40 mg by mouth daily.   Yes [provider]  polyethylene glycol (MIRALAX / GLYCOLAX) packet Take 17 g by mouth 2 (two) times daily.    Yes [provider]  potassium chloride SA (K-DUR,KLOR-CON) 20 MEQ tablet Take 1 tablet (20 mEq  total) by mouth daily. 11/18/17  Yes Ghimire, Henreitta Leber, MD  Probiotic Product (PROBIOTIC DAILY) CAPS Take 1 capsule by mouth daily. 12/21/17 01/20/18 Yes [provider]  promethazine (PHENERGAN) 25 MG suppository Place 25 mg rectally every 6 (six) hours as needed for nausea or vomiting. X 3 days 12/24/17 12/27/17 Yes [provider]  Skin Protectants, Misc. (CALAZIME SKIN PROTECTANT EX) Apply 1 application topically 2 (two) times  daily. Apply to buttock every shift    Yes [provider]  Skin Protectants, Misc. (EUCERIN) cream Apply to BLE topically two times daily for dry skin   Yes [provider]  traMADol (ULTRAM) 50 MG tablet Take 25 mg by mouth every 12 (twelve) hours as needed for moderate pain.    Yes [provider]  traZODone (DESYREL) 100 MG tablet Take 100 mg by mouth at bedtime as needed for sleep.   Yes [provider]  LevoFLOXacin in D5W (LEVAQUIN IV) Inject 750 mg into the vein. Every other day for 7 administrations 12/21/17 01/04/18  [provider]  lisinopril (ZESTRIL) 2.5 MG tablet Take 2 tablets (5 mg total) by mouth daily. Patient not taking: Reported on 12/25/2017 11/17/17 11/17/18  Jonetta Osgood, MD     Vital Signs: BP 115/65 (BP Location: Left Arm)   Pulse 91   Temp 98.8 F (37.1 C) (Oral)   Resp 20   Ht 5\' 11"  (1.803 m)   Wt 219 lb 12.8 oz (99.7 kg)   SpO2 95%   BMI 30.66 kg/m   Physical Exam  Abdominal: Soft. Bowel sounds are normal.  Skin: Skin is warm and dry.  Sites are clean and dry NT no bleeding Noted small skin blisters at tape edges (will ask RN to change dressing to paper tape)  OP GB drain: 155 cc yesterday; 100 cc in bag ABUNDANT ENTEROCOCCUS FAECALIS  ABUNDANT ESCHERICHIA COLI  OP perihepatic drain: yellow thick fluid--25 cc yesterday; 10 cc in JP: RARE ENTEROCOCCUS FAECALIS  Nursing note and vitals reviewed.    Nursing note and vitals reviewed.   Imaging: Nm Hepatobiliary Liver Func  Result Date: 12/26/2017 CLINICAL DATA:  Gallbladder wall thickening and sludge with right upper quadrant abdominal pain EXAM: NUCLEAR MEDICINE HEPATOBILIARY IMAGING TECHNIQUE: Sequential images of the abdomen were obtained out to 60 minutes following intravenous administration of radiopharmaceutical. RADIOPHARMACEUTICALS:  5.5 mCi Tc-63m  Choletec IV COMPARISON:  Abdominal ultrasound 12/25/2017 FINDINGS: Satisfactory uptake of  radiopharmaceutical from the blood pool. Biliary and bowel activity by 5 minutes. Initial non filling of the gallbladder prompted IV administration of 4 mg of morphine. Subsequent imaging over the next 30 minutes demonstrates vague activity in the area of the gallbladder fossa but no definitive gallbladder filling. IMPRESSION: 1. Lack of definitive filling of the gallbladder on hepatobiliary scan despite morphine administration. Appearance is suspicious for lack of patency of the cystic duct and cholecystitis. Electronically Signed   By: Van Clines M.D.   On: 12/26/2017 12:44   Ir Perc Cholecystostomy  Result Date: 12/26/2017 INDICATION: Sepsis and imaging findings consistent with acute cholecystitis. The patient is not a candidate for cholecystectomy currently and request has been made to place a percutaneous cholecystostomy tube. There is an additional fluid collection anterior to the liver suspicious by CT for additional perihepatic abscess. EXAM: 1. PERCUTANEOUS CHOLECYSTOSTOMY TUBE PLACEMENT 2. PERCUTANEOUS CATHETER DRAINAGE OF PERIHEPATIC PERITONEAL ABSCESS ANESTHESIA/SEDATION: Moderate (conscious) sedation was employed during this procedure. A total of Versed 2.0 mg and Fentanyl 100 mcg was administered  intravenously. Moderate Sedation Time: 25 minutes. The patient's level of consciousness and vital signs were monitored continuously by radiology nursing throughout the procedure under my direct supervision. MEDICATIONS: No additional medications. FLUOROSCOPY TIME:  Fluoroscopy Time: 54 seconds.  29.3 mGy. COMPLICATIONS: None immediate. PROCEDURE: Informed written consent was obtained from the patient after a thorough discussion of the procedural risks, benefits and alternatives. All questions were addressed. Maximal Sterile Barrier Technique was utilized including caps, mask, sterile gowns, sterile gloves, sterile drape, hand hygiene and skin antiseptic. A timeout was performed prior to the  initiation of the procedure. The gallbladder was localized by ultrasound. Under ultrasound guidance, a 21 gauge needle was advanced into the gallbladder lumen. Bile was aspirated from the gallbladder lumen. A small amount of contrast material was injected. A guidewire was advanced. A transitional dilator was placed. The tract was dilated and a 10 French percutaneous drainage catheter advanced. A bile sample was sent for culture analysis. The catheter was connected to gravity bag drainage. It was secured at the skin with a Prolene retention suture and StatLock device. The anterior perihepatic abscess was localized by ultrasound. Under ultrasound guidance, a 5 [place] Yueh centesis catheter was advanced into the collection. Aspiration was performed and a fluid sample sent for culture analysis. The catheter was removed over a guidewire. The tract was dilated and a 10 French percutaneous drainage catheter placed. The catheter was connected to suction bulb drainage. It was secured at the skin with a Prolene retention suture and StatLock device. FINDINGS: By ultrasound the gallbladder is distended, contains multiple calculi and demonstrates wall thickening and inflammation. Aspiration yielded grossly purulent fluid from the gallbladder lumen. A 10 French cholecystostomy tube was placed and is draining well after placement. Aspiration at the level of the anterior perihepatic abscess yielded cloudy, thin yellow fluid. Given nature of fluid, an additional 10 [place] drain was placed in this collection. IMPRESSION: 1. Grossly purulent bile in the gallbladder lumen consistent with cholecystitis. A 10 French cholecystostomy tube was placed and attached to gravity bag drainage. 2. Anterior perihepatic fluid collection yielded cloudy yellow fluid. A 10 French percutaneous drainage catheter was placed in this collection and attached to suction bulb drainage. Electronically Signed   By: Aletta Edouard M.D.   On: 12/26/2017 17:32    Dg Chest Port 1 View  Result Date: 12/26/2017 CLINICAL DATA:  Cough and congestion EXAM: PORTABLE CHEST 1 VIEW COMPARISON:  December 25, 2016 FINDINGS: There is airspace consolidation in the left base and right mid lung regions. These changes are similar to 1 day prior. There appears to be a degree of underlying interstitial edema. There is cardiomegaly. The pulmonary vascularity is within normal limits. There is aortic atherosclerosis. No adenopathy. There is degenerative change in the thoracic spine. There is calcification in the right carotid artery. IMPRESSION: Cardiomegaly with underlying interstitial edema. Areas of airspace consolidation in the left base and right mid lung may represent alveolar edema or pneumonia. Both entities may be present concurrently. Appearance similar 1 day prior. There is aortic atherosclerosis as well as calcification in right carotid artery. Aortic Atherosclerosis (ICD10-I70.0). Electronically Signed   By: Lowella Grip III M.D.   On: 12/26/2017 07:15   Ir Image Guided Drainage Percut Cath  Peritoneal Retroperit  Result Date: 12/26/2017 INDICATION: Sepsis and imaging findings consistent with acute cholecystitis. The patient is not a candidate for cholecystectomy currently and request has been made to place a percutaneous cholecystostomy tube. There is an additional fluid collection anterior to the  liver suspicious by CT for additional perihepatic abscess. EXAM: 1. PERCUTANEOUS CHOLECYSTOSTOMY TUBE PLACEMENT 2. PERCUTANEOUS CATHETER DRAINAGE OF PERIHEPATIC PERITONEAL ABSCESS ANESTHESIA/SEDATION: Moderate (conscious) sedation was employed during this procedure. A total of Versed 2.0 mg and Fentanyl 100 mcg was administered intravenously. Moderate Sedation Time: 25 minutes. The patient's level of consciousness and vital signs were monitored continuously by radiology nursing throughout the procedure under my direct supervision. MEDICATIONS: No additional medications.  FLUOROSCOPY TIME:  Fluoroscopy Time: 54 seconds.  29.3 mGy. COMPLICATIONS: None immediate. PROCEDURE: Informed written consent was obtained from the patient after a thorough discussion of the procedural risks, benefits and alternatives. All questions were addressed. Maximal Sterile Barrier Technique was utilized including caps, mask, sterile gowns, sterile gloves, sterile drape, hand hygiene and skin antiseptic. A timeout was performed prior to the initiation of the procedure. The gallbladder was localized by ultrasound. Under ultrasound guidance, a 21 gauge needle was advanced into the gallbladder lumen. Bile was aspirated from the gallbladder lumen. A small amount of contrast material was injected. A guidewire was advanced. A transitional dilator was placed. The tract was dilated and a 10 French percutaneous drainage catheter advanced. A bile sample was sent for culture analysis. The catheter was connected to gravity bag drainage. It was secured at the skin with a Prolene retention suture and StatLock device. The anterior perihepatic abscess was localized by ultrasound. Under ultrasound guidance, a 5 [place] Yueh centesis catheter was advanced into the collection. Aspiration was performed and a fluid sample sent for culture analysis. The catheter was removed over a guidewire. The tract was dilated and a 10 French percutaneous drainage catheter placed. The catheter was connected to suction bulb drainage. It was secured at the skin with a Prolene retention suture and StatLock device. FINDINGS: By ultrasound the gallbladder is distended, contains multiple calculi and demonstrates wall thickening and inflammation. Aspiration yielded grossly purulent fluid from the gallbladder lumen. A 10 French cholecystostomy tube was placed and is draining well after placement. Aspiration at the level of the anterior perihepatic abscess yielded cloudy, thin yellow fluid. Given nature of fluid, an additional 10 [place] drain was placed  in this collection. IMPRESSION: 1. Grossly purulent bile in the gallbladder lumen consistent with cholecystitis. A 10 French cholecystostomy tube was placed and attached to gravity bag drainage. 2. Anterior perihepatic fluid collection yielded cloudy yellow fluid. A 10 French percutaneous drainage catheter was placed in this collection and attached to suction bulb drainage. Electronically Signed   By: Aletta Edouard M.D.   On: 12/26/2017 17:32    Labs:  CBC: Recent Labs    12/26/17 0554 12/27/17 1115 12/28/17 0339 12/29/17 0437  WBC 17.6* 18.7* 15.9* 13.5*  HGB 11.6* 11.4* 11.6* 11.4*  HCT 35.5* 34.8* 35.3* 36.0*  PLT 370 376 353 409*    COAGS: Recent Labs    04/06/17 1636 11/15/17 0414 12/25/17 1121  INR 0.96 1.15 1.21  APTT  --  31  --     BMP: Recent Labs    12/26/17 0554 12/27/17 1115 12/28/17 0339 12/29/17 0437  NA 141 138 136 138  K 3.8 4.0 3.5 4.0  CL 109 106 104 104  CO2 21* 23 23 26   GLUCOSE 151* 274* 136* 168*  BUN 34* 30* 23* 20  CALCIUM 8.4* 8.1* 7.9* 8.0*  CREATININE 1.37* 1.28* 1.13 1.00  GFRNONAA 47* 51* 59* >60  GFRAA 54* 59* >60 >60    LIVER FUNCTION TESTS: Recent Labs    11/14/17 2130 11/15/17 0414 11/16/17 0550  12/25/17 1026  BILITOT 0.6 0.8 0.5 0.8  AST 47* 31 53* 113*  ALT 36 29 45 82*  ALKPHOS 98 75 71 108  PROT 8.0 6.6 6.7 7.2  ALBUMIN 2.9* 2.4* 2.4* 2.1*    Assessment and Plan:  Perc Chole and perihep drains intact Draining well Will follow  Electronically Signed: Zaniel Marineau A, PA-C 12/29/2017, 1:29 PM   I spent a total of 15 Minutes at the the patient's bedside AND on the patient's hospital floor or unit, greater than 50% of which was counseling/coordinating care for perc chole and perihep abscess drains

## 2017-12-29 NOTE — Progress Notes (Signed)
General Surgery [facility]  Assessment & Plan: HD#5 - Sepsis/Lactic acidosis, liver abscess-subdiaphragmatic fluid collection / acute cholecystitis      IR perihepatic abscess drainage and percutaneous cholecystostomy 12/26/17      IV Zosyn      Advance diet as tolerated      Will follow with you - plan to see Monday - call if needed earlier      [facility] management per IR team  Medical management planned.  Follow up after drains are removed with Dr. Marlou Starks at [place] office.         Earnstine Regal, MD, [facility]       Office: [phone]    Chief Complaint: Acute cholecystitis, perihepatic abscess, sepsis  Subjective: Patient in bed, somnolent.  States pain is improved.  Objective: Vital signs in last 24 hours: Temp:  [98.1 F (36.7 C)-98.8 F (37.1 C)] 98.8 F (37.1 C) (03/16 0609) Pulse Rate:  [91-105] 91 (03/16 0609) Resp:  [20] 20 (03/16 0609) BP: (112-141)/(55-68) 115/65 (03/16 0609) SpO2:  [60 %-95 %] 95 % (03/16 0609) Weight:  [99.7 kg (219 lb 12.8 oz)] 99.7 kg (219 lb 12.8 oz) (03/16 0609) Last BM Date: 12/28/17  Intake/Output from previous day: 03/15 0701 - 03/16 0700 In: 270 [P.O.:100; I.V.:10; IV Piggyback:150] Out: 980 [Urine:800; Drains:180] Intake/Output this shift: No intake/output data recorded.  Physical Exam: HEENT - sclerae clear, mucous membranes moist Neck - soft Chest - clear bilaterally Cor - RRR Abdomen - soft, minimal RUQ tenderness; BS present; thin bilious in perc cholecystostomy; serous in JP-type drain  Lab Results:  Recent Labs    12/27/17 1115 12/28/17 0339  WBC 18.7* 15.9*  HGB 11.4* 11.6*  HCT 34.8* 35.3*  PLT 376 353   BMET Recent Labs    12/28/17 0339 12/29/17 0437  NA 136 138  K 3.5 4.0  CL 104 104  CO2 23 26  GLUCOSE 136* 168*  BUN 23* 20  CREATININE 1.13 1.00  CALCIUM 7.9* 8.0*   PT/INR No results for input(s): LABPROT, INR in the last 72  hours. Comprehensive Metabolic Panel:    Component Value Date/Time   NA 138 12/29/2017 0437   NA 136 12/28/2017 0339   NA 136 (A) 12/24/2017   NA 139 09/27/2017   K 4.0 12/29/2017 0437   K 3.5 12/28/2017 0339   CL 104 12/29/2017 0437   CL 104 12/28/2017 0339   CO2 26 12/29/2017 0437   CO2 23 12/28/2017 0339   BUN 20 12/29/2017 0437   BUN 23 (H) 12/28/2017 0339   BUN 38 (A) 12/24/2017   BUN 15 09/27/2017   CREATININE 1.00 12/29/2017 0437   CREATININE 1.13 12/28/2017 0339   GLUCOSE 168 (H) 12/29/2017 0437   GLUCOSE 136 (H) 12/28/2017 0339   CALCIUM 8.0 (L) 12/29/2017 0437   CALCIUM 7.9 (L) 12/28/2017 0339   AST 113 (H) 12/25/2017 1026   AST 53 (H) 11/16/2017 0550   ALT 82 (H) 12/25/2017 1026   ALT 45 11/16/2017 0550   ALKPHOS 108 12/25/2017 1026   ALKPHOS 71 11/16/2017 0550   BILITOT 0.8 12/25/2017 1026   BILITOT 0.5 11/16/2017 0550   PROT 7.2 12/25/2017 1026   PROT 6.7 11/16/2017 0550   ALBUMIN 2.1 (L) 12/25/2017 1026   ALBUMIN 2.4 (L) 11/16/2017 0550    Studies/Results: No results found.    Mahrukh Seguin M 12/29/2017  Patient ID: Preston Moore, male  DOB: [birth year], 84 y.o.   MRN: [id number]

## 2017-12-30 LAB — CBC WITH DIFFERENTIAL/PLATELET
BASOS PCT: 0 %
Basophils Absolute: 0 10*3/uL (ref 0.0–0.1)
EOS ABS: 0.2 10*3/uL (ref 0.0–0.7)
Eosinophils Relative: 1 %
HCT: 35.1 % — ABNORMAL LOW (ref 39.0–52.0)
Hemoglobin: 11.2 g/dL — ABNORMAL LOW (ref 13.0–17.0)
Lymphocytes Relative: 21 %
Lymphs Abs: 2.5 10*3/uL (ref 0.7–4.0)
MCH: 27.7 pg (ref 26.0–34.0)
MCHC: 31.9 g/dL (ref 30.0–36.0)
MCV: 86.9 fL (ref 78.0–100.0)
MONOS PCT: 7 %
Monocytes Absolute: 0.8 10*3/uL (ref 0.1–1.0)
Neutro Abs: 8.1 10*3/uL — ABNORMAL HIGH (ref 1.7–7.7)
Neutrophils Relative %: 71 %
Platelets: 387 10*3/uL (ref 150–400)
RBC: 4.04 MIL/uL — ABNORMAL LOW (ref 4.22–5.81)
RDW: 15.3 % (ref 11.5–15.5)
WBC: 11.6 10*3/uL — ABNORMAL HIGH (ref 4.0–10.5)

## 2017-12-30 LAB — GLUCOSE, CAPILLARY
GLUCOSE-CAPILLARY: 196 mg/dL — AB (ref 65–99)
GLUCOSE-CAPILLARY: 231 mg/dL — AB (ref 65–99)
Glucose-Capillary: 186 mg/dL — ABNORMAL HIGH (ref 65–99)
Glucose-Capillary: 195 mg/dL — ABNORMAL HIGH (ref 65–99)

## 2017-12-30 LAB — BASIC METABOLIC PANEL
Anion gap: 10 (ref 5–15)
BUN: 20 mg/dL (ref 6–20)
CALCIUM: 7.9 mg/dL — AB (ref 8.9–10.3)
CO2: 24 mmol/L (ref 22–32)
CREATININE: 1.23 mg/dL (ref 0.61–1.24)
Chloride: 101 mmol/L (ref 101–111)
GFR calc non Af Amer: 53 mL/min — ABNORMAL LOW (ref >60)
Glucose, Bld: 176 mg/dL — ABNORMAL HIGH (ref 65–99)
Potassium: 4.1 mmol/L (ref 3.5–5.1)
Sodium: 135 mmol/L (ref 135–145)

## 2017-12-30 LAB — CULTURE, BLOOD (ROUTINE X 2)
Culture: NO GROWTH
Special Requests: ADEQUATE

## 2017-12-30 MED ORDER — SODIUM CHLORIDE 0.9 % IV SOLN
500.0000 mg | Freq: Four times a day (QID) | INTRAVENOUS | Status: DC
  Administered 2017-12-30 – 2018-01-03 (×17): 500 mg via INTRAVENOUS
  Filled 2017-12-30 (×18): qty 500

## 2017-12-30 MED ORDER — JUVEN PO PACK
1.0000 | PACK | Freq: Two times a day (BID) | ORAL | Status: DC
  Administered 2017-12-30 – 2018-01-02 (×7): 1 via ORAL
  Filled 2017-12-30 (×9): qty 1

## 2017-12-30 MED ORDER — GLUCERNA SHAKE PO LIQD
237.0000 mL | Freq: Three times a day (TID) | ORAL | Status: DC
  Administered 2017-12-30 – 2018-01-04 (×11): 237 mL via ORAL
  Filled 2017-12-30 (×20): qty 237

## 2017-12-30 NOTE — Progress Notes (Signed)
PROGRESS NOTE    Preston Moore  XHB:[id number] DOB: 1934-09-22 DOA: 12/25/2017 PCP: Gildardo Cranker, DO    Brief Narrative:  84 year old male who presented with tachycardia. Patient does have a significant past medical history of systolic heart failure, bilateral hip replacement about 2 years ago, bedbound and history of bilateral heel ulcers. He is a nursing home resident, he was noted to have significant tachycardia, fever and hypotension. On the initial physical examination blood pressure 109/69, heart rate 110, temperature 98.2, respiratory 26, dry mucous membranes, lungs were clear to auscultation bilaterally, decreased at bases, heart S1-S2 present, rhythmic, abdomen was soft nontender, no lower extremity edema.Sodium 135, potassium 4.4, chloride 100, bicarb 22, glucose 343, BUN 37, creatinine 1.43, venous lactic acid 4.3, white count 16.3, hemoglobin 13.0, hematocrit 39.7, platelets 356,urine analysis 50 glucose, pH 5.0, protein 30, specific gravity 1.028, RBC 0-5, white cells 0-5,positive granular and hyaline casts.Abdominal CT with several low-attenuation foci within the medial segment of the left lobe of the liver, contiguous to the gallbladder, suspicion for small liver abscess, subdiaphragmatic fluid collection is identified as suspicious for abscess.Chest x-ray hypoinflated, left rotation, increased interstitial markings bilaterally.EKG atrial fibrillation, poor baseline, 131 bpm, normal axis.  Patient was admitted to the hospital with a working diagnosis of sepsis due to liver abscess, acalculous cholecystitis,complicated by acute kidney injury in atrial fibrillation with rapid ventricular response.     Assessment & Plan:   Active Problems:   Septic shock (HCC)   Protein-calorie malnutrition, severe   Pressure injury of skin  1. Resolving sepsis due to acalculous cholecystitis and liver abscess/ enterococcus and ESBL E coli.Biliary culture positive for ESBL e  coli antibiotic change to  continue  IV meropenem. Continue to encourage po intake. Patient has been afebrile, wbc down to 11.6.   2. Atrial fibrillation and flutter.continue metoprolol to 25 mg po bid. On telemetry monitoring. No anticoagulation for now.  3. Systolic heart failure, EF 30 to 35%(with no decompensation) Holding on ace inh due to risk of hypotension. Continue metoprolol, no signs of volume overload.   4. AKI.Serum cr down to 1.2 with K at 4,1. Avoid hypotension or nephrotoxic medications, continue close follow up of renal function and electrolytes.  5. HTN.Stable blood pressure.    6. COPD.Stable with no clinical signs of exacerbation.  7. T2DM.Glucose cover and monitoring with insulin sliding scale.Capillary glucose 156, 196, 235, 196, 231.   DVT prophylaxis:scd Code Status:dnr Family Communication:I spoke with patient's family at the bedside and all questions were addressed.  Disposition Plan:snf   Consultants:    Procedures:    Antimicrobials: Zosyn IV  Subjective: Patient responded well to olanzapine last night, this am with agitation or confusion, slow to respond to questions, tolerating po well.   Objective: Vitals:   12/29/17 2013 12/30/17 0428 12/30/17 0434 12/30/17 0938  BP: 129/70 (!) 112/51  109/68  Pulse: 93 91    Resp: 20 20    Temp: 98.2 F (36.8 C) 99.3 F (37.4 C)    TempSrc: Oral Axillary    SpO2: 92% 92%    Weight:   99.5 kg (219 lb 5.7 oz)   Height:        Intake/Output Summary (Last 24 hours) at 12/30/2017 1041 Last data filed at 12/30/2017 0914 Gross per 24 hour  Intake 327 ml  Output 805 ml  Net -478 ml   Filed Weights   12/28/17 0500 12/29/17 0609 12/30/17 0434  Weight: 98.2 kg (216 lb 7.9 oz) 99.7 kg (  219 lb 12.8 oz) 99.5 kg (219 lb 5.7 oz)    Examination:   General: Not in pain or dyspnea, deconditioned Neurology: Awake and alert, non focal, slow to respond to questions E ENT: mild  pallor, no icterus, oral mucosa moist Cardiovascular: No JVD. S1-S2 present, rhythmic, no gallops, rubs, or murmurs. No lower extremity edema. Pulmonary: decreased breath sounds bilaterally at bases, poor inspiratory effort, no wheezing, rhonchi or rales. Gastrointestinal. Abdomen protuberant, drain in place at the right upper quadrant, no organomegaly, non tender, no rebound or guarding Skin. No rashes Musculoskeletal: no joint deformities     Data Reviewed: I have personally reviewed following labs and imaging studies  CBC: Recent Labs  Lab 12/24/17  12/25/17 1026 12/26/17 0554 12/27/17 1115 12/28/17 0339 12/29/17 0437 12/30/17 0640  WBC 21.7   < > 16.3* 17.6* 18.7* 15.9* 13.5* 11.6*  NEUTROABS 20  --  14.8*  --   --  13.3* 10.3* 8.1*  HGB 11.7*  --  13.0 11.6* 11.4* 11.6* 11.4* 11.2*  HCT 36*  --  39.7 35.5* 34.8* 35.3* 36.0* 35.1*  MCV  --    < > 87.4 87.7 86.8 86.9 88.0 86.9  PLT 296  --  356 370 376 353 409* 387   < > = values in this interval not displayed.   Basic Metabolic Panel: Recent Labs  Lab 12/26/17 0554 12/27/17 1115 12/28/17 0339 12/29/17 0437 12/30/17 0640  NA 141 138 136 138 135  K 3.8 4.0 3.5 4.0 4.1  CL 109 106 104 104 101  CO2 21* 23 23 26 24   GLUCOSE 151* 274* 136* 168* 176*  BUN 34* 30* 23* 20 20  CREATININE 1.37* 1.28* 1.13 1.00 1.23  CALCIUM 8.4* 8.1* 7.9* 8.0* 7.9*  MG 1.5* 2.0 2.0  --   --   PHOS 2.1* 2.2*  --   --   --    GFR: Estimated Creatinine Clearance: 56.6 mL/min (by C-G formula based on SCr of 1.23 mg/dL). Liver Function Tests: Recent Labs  Lab 12/25/17 1026  AST 113*  ALT 82*  ALKPHOS 108  BILITOT 0.8  PROT 7.2  ALBUMIN 2.1*   No results for input(s): LIPASE, AMYLASE in the last 168 hours. No results for input(s): AMMONIA in the last 168 hours. Coagulation Profile: Recent Labs  Lab 12/25/17 1121  INR 1.21   Cardiac Enzymes: Recent Labs  Lab 12/25/17 1122 12/25/17 1446  TROPONINI 0.03* 0.03*   BNP (last 3  results) No results for input(s): PROBNP in the last 8760 hours. HbA1C: No results for input(s): HGBA1C in the last 72 hours. CBG: Recent Labs  Lab 12/29/17 0743 12/29/17 1123 12/29/17 1734 12/29/17 2100 12/30/17 0803  GLUCAP 143* 156* 196* 235* 196*   Lipid Profile: No results for input(s): CHOL, HDL, LDLCALC, TRIG, CHOLHDL, LDLDIRECT in the last 72 hours. Thyroid Function Tests: No results for input(s): TSH, T4TOTAL, FREET4, T3FREE, THYROIDAB in the last 72 hours. Anemia Panel: No results for input(s): VITAMINB12, FOLATE, FERRITIN, TIBC, IRON, RETICCTPCT in the last 72 hours.    Radiology Studies: I have reviewed all of the imaging during this hospital visit personally     Scheduled Meds: . feeding supplement (GLUCERNA SHAKE)  237 mL Oral TID BM  . insulin aspart  0-20 Units Subcutaneous TID AC & HS  . insulin glargine  15 Units Subcutaneous QHS  . lactobacillus  1 g Oral Q breakfast  . metoprolol tartrate  25 mg Oral BID  . nutrition supplement (JUVEN)  1 packet Oral BID WC  . OLANZapine  5 mg Oral QHS  . sodium chloride flush  5 mL Intracatheter Q12H   Continuous Infusions: . sodium chloride    . piperacillin-tazobactam (ZOSYN)  IV Stopped (12/30/17 0906)     LOS: 5 days        Posey Jasmin Gerome Apley, MD [facility] Pager [phone]

## 2017-12-30 NOTE — Consult Note (Signed)
Palliative Care Consult note  I met today with patient and 3 of his daughters. He is not able to participate in goals conversation.  We discussed clinical course as well as wishes moving forward in regard to advanced directives.  Discussed continued decline over the last couple of years.  Concepts specific to code status and rehospitalization discussed.  We discussed difference between a aggressive medical intervention path and a palliative, comfort focused care path.  Values and goals of care important to patient and family were attempted to be elicited.  Concept of Hospice and Palliative Care were discussed  Family would like to continue current therapies for another 48 hours.    Plan for trial of zyprexa for questionable delirium and to help with appetite and sleep wake cycle.  Questions and concerns addressed.   PMT will continue to support holistically.  Total time: 80 minutes Greater than 50%  of this time was spent counseling and coordinating care related to the above assessment and plan.  Micheline Rough, MD [place] Team [phone]

## 2017-12-30 NOTE — Progress Notes (Signed)
Pharmacy Antibiotic Note  Preston Moore is a 84 y.o. male admitted on 12/25/2017 with intra-abdominal infection with ESBL e. Coli and enterococcus.  Pharmacy has been consulted for imipenem dosing. Patient with cholecystitis/liver abscess. IR placed percutaneous drain with subsequent cultures growing ESBL e. Coli and enterococcuss.  Attempted to contact [place] 3/16 (X3) but no response.  Discussed case with ID today and recommended change to imipenem (use over meropenem for better enterococcus coverage). Although ESBL organism susceptible to zosyn (since 3/15, previously on meropenem), preference is to use carbapenem  Plan: Based on current renal fx, imipenem 500mg  IV q6h  - monitor renal function  Height: 5\' 11"  (180.3 cm) Weight: 219 lb 5.7 oz (99.5 kg) IBW/kg (Calculated) : 75.3  Temp (24hrs), Avg:98.5 F (36.9 C), Min:98 F (36.7 C), Max:99.3 F (37.4 C)  Recent Labs  Lab 12/25/17 1029 12/25/17 1450 12/26/17 0554 12/27/17 1115 12/28/17 0339 12/29/17 0437 12/30/17 0640  WBC  --   --  17.6* 18.7* 15.9* 13.5* 11.6*  CREATININE  --   --  1.37* 1.28* 1.13 1.00 1.23  LATICACIDVEN 4.35* 2.1*  --   --   --   --   --     Estimated Creatinine Clearance: 56.6 mL/min (by C-G formula based on SCr of 1.23 mg/dL).    No Known Allergies  Antimicrobials this admission: 3/12 Linezolid>>3/15 3/12 Meropenem>>3/15 3/15 Zosyn >> 3/17 3/17 imipenem >>  Dose adjustments this admission: 3/14 meropenem 1gm q12h to 1gm q8h  Microbiology results: 3/12BCx: 1/2 CoNS 3/12UCx: ngF 3/12 MRSa PCR: negative 3/13 perihepatic abscess: abundant WBC, rare GPC, NGTD 3/13 GB bile: abundant WBC, few GPC, few GNR - ESBL Ecoli and entercoccus faecalis  Thank you for allowing pharmacy to be a part of this patient's care.  Doreene Eland, PharmD, BCPS.   Pager: [phone] 12/30/2017 11:38 AM

## 2017-12-30 NOTE — Progress Notes (Signed)
Nutrition Follow-up  DOCUMENTATION CODES:   Obesity unspecified, Severe malnutrition in context of acute illness/injury  INTERVENTION:   -Continue Juven Fruit Punch BID, each serving provides 80kcal and 14g of protein (amino acids glutamine and arginine) -Continue Glucerna Shake po TID, each supplement provides 220 kcal and 10 grams of protein  NUTRITION DIAGNOSIS:   Severe Malnutrition related to acute illness(acute cholecystitis) as evidenced by percent weight loss, energy intake < or equal to 50% for > or equal to 5 days(12.3% weight loss in 1 month).  Ongoing.  GOAL:   Patient will meet greater than or equal to 90% of their needs  Not meeting.  MONITOR:   PO intake, Supplement acceptance, Labs, Weight trends, Skin, I & O's   REASON FOR ASSESSMENT:   Consult  ASSESSMENT:   84 year old male admitted with sepsis from a SNF. Pt is bedbound s/p bilateral hip replacement 2 years ago. PMH significant for DM on insulin, HTN, vitamin B12 deficiency, SBO in 2013, and HLD. Pt found to have liver abscess but is not a surgical candidate.  Patient now on soft diet. RD placed orders for Juven supplements and Glucerna shakes as recommended in initial assessment. Pt continues to not eat well. Has been on full liquid diet until yesterday. Will continue to monitor PO intakes. Noted that palliative care consulted for [place].   Weight is now +3 lb since 3/13.  Medications reviewed. Labs reviewed: CBGs: 196-231  Diet Order:  DIET SOFT Room service appropriate? Yes; Fluid consistency: Thin  EDUCATION NEEDS:   No education needs have been identified at this time  Skin:  Skin Assessment: Skin Integrity Issues: Skin Integrity Issues:: Stage II, Stage III, Unstageable, DTI DTI: left heel Stage II: bilateral buttocks Stage III: right heel Unstageable: right heel Other: wound to L and R heel  Last BM:  3/15  Height:   Ht Readings from Last 1 Encounters:  12/26/17 5\' 11"  (1.803 m)     Weight:   Wt Readings from Last 1 Encounters:  12/30/17 219 lb 5.7 oz (99.5 kg)    Ideal Body Weight:  78.2 kg  BMI:  Body mass index is 30.59 kg/m.  Estimated Nutritional Needs:   Kcal:  2250-2450 kcal/day  Protein:  100-115 grams/day  Fluid:  2.2-2.4 L/day  Clayton Bibles, MS, RD, LDN [place] [facility] Pager: [phone] After Hours Pager: [phone]

## 2017-12-30 NOTE — Plan of Care (Signed)
  Progressing Clinical Measurements: Ability to maintain clinical measurements within normal limits will improve 12/30/2017 1323 - Progressing by Dmitriy Gair W, RN Will remain free from infection 12/30/2017 1323 - Progressing by Graig Hessling, Scarlett Presto, RN Diagnostic test results will improve 12/30/2017 1323 - Progressing by Kyani Simkin, Scarlett Presto, RN Respiratory complications will improve 12/30/2017 1323 - Progressing by Holland Kotter, Scarlett Presto, RN Cardiovascular complication will be avoided 12/30/2017 1323 - Progressing by Kariya Lavergne W, RN Coping: Level of anxiety will decrease 12/30/2017 1323 - Progressing by Isidra Mings W, RN Elimination: Will not experience complications related to bowel motility 12/30/2017 1323 - Progressing by Kolsen Choe, Scarlett Presto, RN Safety: Ability to remain free from injury will improve 12/30/2017 1323 - Progressing by Bula Cavalieri, Scarlett Presto, RN Skin Integrity: Risk for impaired skin integrity will decrease 12/30/2017 1323 - Progressing by Nilesh Stegall, Scarlett Presto, RN

## 2017-12-30 NOTE — Progress Notes (Signed)
Patient had 9 runs of V-tach, vitals WNL, patient asymptomatic, X. Blount-NP notified. Lab ordered. Will continue to assess patient.

## 2017-12-30 NOTE — Progress Notes (Signed)
General Surgery [facility]  Assessment & Plan: HD#6 - Sepsis/Lactic acidosis, liver abscess-subdiaphragmatic fluid collection / acute cholecystitis      IR perihepatic abscess drainage and percutaneous cholecystostomy 12/26/17      IV Zosyn discontinued today - begin Imipenem      Regular diet this AM      Will follow with you      Drain management per IR team  Medical management planned.  Follow up after drains are removed with Dr. Marlou Starks at [place] office.         Earnstine Regal, MD, [facility]       Office: [phone]    Chief Complaint: Sepsis, cholecystitis, peri-hepatic abscess  Subjective: Patient in bed, eating regular lunch.  Grand-daughter at bedside.  No complaints.  Objective: Vital signs in last 24 hours: Temp:  [98 F (36.7 C)-99.3 F (37.4 C)] 99.3 F (37.4 C) (03/17 0428) Pulse Rate:  [87-93] 91 (03/17 0428) Resp:  [20] 20 (03/17 0428) BP: (109-136)/(51-70) 109/68 (03/17 0938) SpO2:  [92 %-94 %] 92 % (03/17 0428) Weight:  [99.5 kg (219 lb 5.7 oz)] 99.5 kg (219 lb 5.7 oz) (03/17 0434) Last BM Date: 12/28/17  Intake/Output from previous day: 03/16 0701 - 03/17 0700 In: 100 [IV Piggyback:100] Out: 805 [Urine:750; Drains:55] Intake/Output this shift: Total I/O In: 227 [P.O.:222; I.V.:5] Out: -   Physical Exam: HEENT - sclerae clear, mucous membranes moist Neck - soft Chest - clear bilaterally Cor - RRR Abdomen - soft, obese; Drains with thin greenish output   Lab Results:  Recent Labs    12/29/17 0437 12/30/17 0640  WBC 13.5* 11.6*  HGB 11.4* 11.2*  HCT 36.0* 35.1*  PLT 409* 387   BMET Recent Labs    12/29/17 0437 12/30/17 0640  NA 138 135  K 4.0 4.1  CL 104 101  CO2 26 24  GLUCOSE 168* 176*  BUN 20 20  CREATININE 1.00 1.23  CALCIUM 8.0* 7.9*   PT/INR No results for input(s): LABPROT, INR in the last 72 hours. Comprehensive Metabolic Panel:    Component Value Date/Time   NA 135 12/30/2017 0640   NA 138 12/29/2017 0437   NA 136 (A) 12/24/2017   NA 139 09/27/2017   K 4.1 12/30/2017 0640   K 4.0 12/29/2017 0437   CL 101 12/30/2017 0640   CL 104 12/29/2017 0437   CO2 24 12/30/2017 0640   CO2 26 12/29/2017 0437   BUN 20 12/30/2017 0640   BUN 20 12/29/2017 0437   BUN 38 (A) 12/24/2017   BUN 15 09/27/2017   CREATININE 1.23 12/30/2017 0640   CREATININE 1.00 12/29/2017 0437   GLUCOSE 176 (H) 12/30/2017 0640   GLUCOSE 168 (H) 12/29/2017 0437   CALCIUM 7.9 (L) 12/30/2017 0640   CALCIUM 8.0 (L) 12/29/2017 0437   AST 113 (H) 12/25/2017 1026   AST 53 (H) 11/16/2017 0550   ALT 82 (H) 12/25/2017 1026   ALT 45 11/16/2017 0550   ALKPHOS 108 12/25/2017 1026   ALKPHOS 71 11/16/2017 0550   BILITOT 0.8 12/25/2017 1026   BILITOT 0.5 11/16/2017 0550   PROT 7.2 12/25/2017 1026   PROT 6.7 11/16/2017 0550   ALBUMIN 2.1 (L) 12/25/2017 1026   ALBUMIN 2.4 (L) 11/16/2017 0550    Studies/Results: No results found.    Shown Dissinger M 12/30/2017  Patient ID: Preston Moore, male   DOB: 1934/09/21, 84 y.o.  MRN: [id number]

## 2017-12-31 ENCOUNTER — Inpatient Hospital Stay: Payer: Self-pay

## 2017-12-31 DIAGNOSIS — Z96643 Presence of artificial hip joint, bilateral: Secondary | ICD-10-CM

## 2017-12-31 DIAGNOSIS — E11621 Type 2 diabetes mellitus with foot ulcer: Secondary | ICD-10-CM

## 2017-12-31 DIAGNOSIS — B966 Bacteroides fragilis [B. fragilis] as the cause of diseases classified elsewhere: Secondary | ICD-10-CM

## 2017-12-31 DIAGNOSIS — B9620 Unspecified Escherichia coli [E. coli] as the cause of diseases classified elsewhere: Secondary | ICD-10-CM

## 2017-12-31 DIAGNOSIS — I509 Heart failure, unspecified: Secondary | ICD-10-CM

## 2017-12-31 DIAGNOSIS — B9689 Other specified bacterial agents as the cause of diseases classified elsewhere: Secondary | ICD-10-CM

## 2017-12-31 DIAGNOSIS — K8012 Calculus of gallbladder with acute and chronic cholecystitis without obstruction: Secondary | ICD-10-CM

## 2017-12-31 DIAGNOSIS — R1312 Dysphagia, oropharyngeal phase: Secondary | ICD-10-CM

## 2017-12-31 DIAGNOSIS — Z1612 Extended spectrum beta lactamase (ESBL) resistance: Secondary | ICD-10-CM

## 2017-12-31 DIAGNOSIS — B356 Tinea cruris: Secondary | ICD-10-CM

## 2017-12-31 DIAGNOSIS — Z978 Presence of other specified devices: Secondary | ICD-10-CM

## 2017-12-31 DIAGNOSIS — F17210 Nicotine dependence, cigarettes, uncomplicated: Secondary | ICD-10-CM

## 2017-12-31 DIAGNOSIS — E43 Unspecified severe protein-calorie malnutrition: Secondary | ICD-10-CM

## 2017-12-31 DIAGNOSIS — L89620 Pressure ulcer of left heel, unstageable: Secondary | ICD-10-CM

## 2017-12-31 DIAGNOSIS — Z7401 Bed confinement status: Secondary | ICD-10-CM

## 2017-12-31 DIAGNOSIS — L89610 Pressure ulcer of right heel, unstageable: Secondary | ICD-10-CM

## 2017-12-31 LAB — AEROBIC/ANAEROBIC CULTURE W GRAM STAIN (SURGICAL/DEEP WOUND)

## 2017-12-31 LAB — GLUCOSE, CAPILLARY
GLUCOSE-CAPILLARY: 181 mg/dL — AB (ref 65–99)
GLUCOSE-CAPILLARY: 331 mg/dL — AB (ref 65–99)
Glucose-Capillary: 343 mg/dL — ABNORMAL HIGH (ref 65–99)
Glucose-Capillary: 243 mg/dL — ABNORMAL HIGH (ref 65–99)

## 2017-12-31 LAB — AEROBIC/ANAEROBIC CULTURE (SURGICAL/DEEP WOUND)

## 2017-12-31 MED ORDER — GERHARDT'S BUTT CREAM
TOPICAL_CREAM | Freq: Four times a day (QID) | CUTANEOUS | Status: DC
  Administered 2017-12-31: 1 via TOPICAL
  Administered 2017-12-31 – 2018-01-02 (×8): via TOPICAL
  Administered 2018-01-02: 1 via TOPICAL
  Administered 2018-01-02 – 2018-01-03 (×5): via TOPICAL
  Administered 2018-01-03: 1 via TOPICAL
  Administered 2018-01-04 (×3): via TOPICAL
  Filled 2017-12-31: qty 1

## 2017-12-31 MED ORDER — SODIUM CHLORIDE 0.9% FLUSH
10.0000 mL | INTRAVENOUS | Status: DC | PRN
  Administered 2018-01-01: 20 mL
  Filled 2017-12-31: qty 40

## 2017-12-31 NOTE — Care Management Important Message (Signed)
Important Message  Patient Details  Name: WAYLEN DEPAOLO MRN: [id number] Date of Birth: Sep 11, 1934   Medicare Important Message Given:  Yes    Kerin Salen 12/31/2017, 11:24 AMImportant Message  Patient Details  Name: JAME MORRELL MRN: [id number] Date of Birth: 12/02/1933   Medicare Important Message Given:  Yes    Kerin Salen 12/31/2017, 11:24 AM

## 2017-12-31 NOTE — Progress Notes (Signed)
Inpatient Diabetes Program Recommendations  AACE/ADA: New Consensus Statement on Inpatient Glycemic Control (2015)  Target Ranges:  Prepandial:   less than 140 mg/dL      Peak postprandial:   less than 180 mg/dL (1-2 hours)      Critically ill patients:  140 - 180 mg/dL    Results for Preston Moore, Preston Moore (MRN [id number]) as of 12/31/2017 15:30  Ref. Range 12/30/2017 08:03 12/30/2017 11:59 12/30/2017 17:30 12/30/2017 21:11  Glucose-Capillary Latest Ref Range: 65 - 99 mg/dL 196 (H)  4 units Novolog  231 (H)  7 units Novolog  186 (H)  4 units Novolog  195 (H)  4 units Novolog +  15 units Lantus   Results for Preston Moore, Preston Moore (MRN [id number]) as of 12/31/2017 15:30  Ref. Range 12/31/2017 07:37 12/31/2017 11:48  Glucose-Capillary Latest Ref Range: 65 - 99 mg/dL 181 (H)  5 units Novolog  331 (H)  15 units Novolog     Admit with: Tachycardia, sepsis  History: DM  Home DM Meds: Lantus 57 units QHS       Novolog 15 units TID  Current Insulin Orders: Lantus 15 units QHS      Novolog Resistant Correction Scale/ SSI (0-20 units) TID AC + HS       MD- Please consider the following in-hospital insulin adjustments:  1. Increase Lantus to 20 units QHS  2. Change Novolog SSi to Q4 hour coverage- Pt eating poorly per PO documentation     --Will follow patient during hospitalization--  Wyn Quaker RN, MSN, CDE Diabetes Coordinator Inpatient Glycemic Control Team Team Pager: [phone] (8a-5p)

## 2017-12-31 NOTE — Progress Notes (Signed)
Tele notified this RN of patient going into SVT HR up to 170s. BP was stable. Jeannette Corpus, NP made aware. Pt reported he was in pain, pain medication given and HR came down to 102. Will continue to monitor patient closely.

## 2017-12-31 NOTE — Discharge Instructions (Signed)
Percutaneous Abscess Drain °Percutaneous abscess drain is removal of a collection of infected fluid inside the body (abscess). This is done by placing a thin needle under the skin and moving it into the abscess. A small tube (catheter) is inserted during the procedure and left in place for a few days to continue to drain the abscess. °Tell a health care provider about: °· Any allergies you have. °· All medicines you are taking, including vitamins, herbs, eye drops, creams, and over-the-counter medicines. °· Any problems you or family members have had with anesthetic medicines. °· Any blood disorders you have. °· Any surgeries you have had. °· Any medical conditions you have. °· Whether you are pregnant or may be pregnant. °· Any history of tobacco use or smoking. °What are the risks? °Generally, this is a safe procedure. However, problems may occur, including: °· Infection. °· Bleeding. °· Allergic reaction to medicines or materials used. °· Damage to other structures or organs. °· Blockage of the catheter, requiring placement of a new catheter. °· A need to repeat the procedure. °· Failure of the procedure to drain the abscess completely, requiring an open surgical procedure to drain the abscess. An open procedure is done through a larger incision. ° °What happens before the procedure? °Medicines °· Ask your health care provider about: °? Changing or stopping your regular medicines. This is especially important if you are taking diabetes medicines or blood thinners. °? Taking medicines such as aspirin and ibuprofen. These medicines can thin your blood. Do not take these medicines before your procedure if your health care provider instructs you not to. °Staying hydrated °Follow instructions from your health care provider about hydration, which may include: °· Up to 2 hours before the procedure - you may continue to drink clear liquids, such as water, clear fruit juice, black coffee, and plain tea. ° °Eating and  drinking restrictions °Follow instructions from your health care provider about eating and drinking, which may include: °· 8 hours before the procedure - stop eating heavy meals or foods such as meat, fried foods, or fatty foods. °· 6 hours before the procedure - stop eating light meals or foods, such as toast or cereal. °· 6 hours before the procedure - stop drinking milk or drinks that contain milk. °· 2 hours before the procedure - stop drinking clear liquids. ° °General instructions ° °· Plan to have someone take you home from the hospital or clinic. °· If you will be going home right after the procedure, plan to have someone with you for 24 hours. °· You may have blood tests or urine tests. °· You may get a tetanus shot. °· You may have imaging tests, such as an ultrasound, to check how large or deep your abscess is. °What happens during the procedure? °· To lower your risk of infection: °? Your health care team will wash or sanitize their hands. °? The skin around the abscess will be washed with soap. °? Hair may be removed from the surgical site. °· An IV tube will be inserted into one of your veins. °· You will be given medicine to numb the area (local anesthetic) where the catheter will be placed. Placement of the catheter varies depending on where your abscess is located. °· You may be given medicine to help you relax (sedative) or medicine to make you fall asleep (general anesthetic). °· A small incision will be made in your skin. °· A needle will be inserted under your skin and moved   into the abscess. Images from ultrasound, X-ray, or a CT scan will be used to help guide the needle to the abscess.  A catheter will be inserted into your incision and moved underneath your skin until it reaches the abscess. Images will be used to help guide the catheter to the abscess.  After the catheter is in place, the needle will be removed. The catheter will be connected to a bag outside of your body. The catheter  will stay in place until the fluid has stopped draining and the infection is gone. The procedure may vary among health care providers and hospitals. What happens after the procedure?  Your blood pressure, heart rate, breathing rate, and blood oxygen level will be monitored until the medicines you were given have worn off.  You may have some pain or nausea. Medicines will be available to help you. Summary  An abscess is a collection of infected fluid inside the body.  During this procedure, images from ultrasound, X-rays, or a CT scan are used to help guide the needle and catheter to the abscess.  A catheter will be left in place to continue to drain the abscess after the procedure. This information is not intended to replace advice given to you by your health care provider. Make sure you discuss any questions you have with your health care provider. Document Released: 02/16/2014 Document Revised: 08/24/2016 Document Reviewed: 08/24/2016 Elsevier Interactive Patient Education  2017 [employer].   Biliary Drainage Catheter Home Guide A biliary drainage catheter is a thin, flexible tube that is inserted through your skin into the bile ducts in your liver. Bile is a thick yellow or green fluid that helps digest fat in foods. The purpose of a biliary drainage catheter is to keep bile from backing up into your liver. Backup of bile can occur when there is a blockage that prevents bile from moving from the bile ducts into the small intestine as it should. The blockage can be caused by gallstones, a tumor, or scar tissue. There are three types of biliary drainage:  External biliary drainage. With this type, bile is only drained into a collection bag outside your body (external collection bag).  Internal-external biliary drainage. With this type, bile is drained to an external collection bag as well as into your small intestine.  Internal biliary drainage. With this type, bile is only drained into  your small intestine.  General home care includes these daily actions:  Inspection of your drainage catheter.  Flushing your drainage catheter with saline.  Emptying drainage from the collection bag (if present).  Recording the amount of drainage.  Checking the catheter insertion site for signs of infection. Check for: ? Redness, swelling, or pain. ? Fluid or blood. ? Warmth. ? Pus or a bad smell. How do I inspect my drainage catheter?  Check the dressing to make sure that it is dry and clean.  Look at the skin around the drainage catheter when changing the dressing for any problems such as redness, rash, or skin breakdown.  Check the drainage bag to make sure that drainage fluid is flowing into the bag well. Note the color and amount compared to other days.  Check the drainage catheter and bag for any cracks or kinks in the tubing. How do I change my dressing? The dressing over the drainage catheter should be changed every other day, or more often if needed to keep the dressing dry. Your health care provider will instruct you about how often  to change your dressing. Supplies needed:  Mild soap and warm water.  Split gauze pads, 4 x 4 inches (10 x 10 cm) to use as a dressing sponge.  Gauze pads, 4 x 4 inches (10 x 10 cm) or adhesive dressing cover.  Paper tape. How to change the dressing: 1. Wash your hands with soap and water. 2. Gently remove the old dressing. Avoid using scissors to remove the dressing because they may damage the drainage catheter. 3. Wash the skin around the insertion site with mild soap and warm water, rinse well, then pat the area dry with a clean cloth. 4. Check the skin around the drainage catheter for redness or swelling, or for yellow or green discharge that has a bad smell. 5. If the drainage catheter was stitched (sutured) to the skin, inspect the suture to make sure it is still anchored in the skin. 6. Do not apply creams, ointments, or alcohol  to the site. Allow the skin to air-dry completely before you apply a new dressing. 7. Place the drainage catheter through the slit in a dressing sponge. The dressing sponge should slide under the disk that holds the drainage catheter in place. 8. Cover the drainage catheter and the dressing sponge with a 4 x 4 inch (10 x 10 cm) gauze. The drainage catheter should rest on the gauze and not on the skin. 9. Tape the dressing to the skin. 10. You may be instructed to use an adhesive dressing covering over the top of this in place of the gauze and tape. 11. Wash your hands with soap and water. How do I flush my drainage catheter? Biliary drainagecatheters should be flushed daily, or as often as told by your health care provider. The end of the drainage catheter is closed using an IV cap. A syringe can be directly connected to the IV cap. Supplies needed:  Alcohol swab.  10 mL prefilled normal saline syringe. How to flush the drainage catheter: 1. Wash your hands with soap and water. 2. If your drainage catheter has a stopcock attached to it, turn the stopcock toward the drainage bag. This will allow the saline to flow in the direction of your body. 3. Clean the IV cap with an alcohol swab. 4. Screw the tip of a 10 mL normal saline syringe onto the IV cap. 5. Inject the saline over 5-10 seconds. If you feel resistance while injecting, stop immediately. Avoid  pulling back on the plunger. Doing that could increase your risk of infection. 6. Remove the syringe from the cap. Turn the stopcock so that fluid flows from your body into the drainage bag. You may notice more fluid flowing into the bag after you have completed the flush. How do I attach a bag to my drainage catheter? If you are having trouble with your internal biliary drain, you may be directed by your health care provider to use bag drainage until you can be seen to fix the problem. For this reason, you should always have a collection bag and  connecting tubing at home. If you do not have these supplies, remember to ask for them at your next appointment. 1. Remove the bag and the connecting tubing from their packaging. 2. Connect the funnel end of the tubing to the bag's cone-shaped stem. 3. Remove the IV cap from the biliary drain. To do this, unscrew it and replace it with the screw-on end of the tubing. 4. Save the IV cap in a plastic storage bag that  can be sealed.  How do I empty my collection bag? Empty the collection bag whenever it becomes 2/3 full. Also empty it before you go to sleep. Most collection bags have a drainage valve at the bottom so the bag can be that allows them to be emptied easily. 1. Wash your hands with soap and water. 2. Hold the collection bag over the toilet, basin, or collection container. Use a measuring container if your health care provider told you to measure the drainage. 3. Unscrew the valve to open it, and allow the bag to drain. 4. Close the valve securely to avoid leakage. 5. Use a tissue or disposable napkin to wipe the valve clean. 6. Wash the measuring container with soap and water. 7. Record the amount of drainage as told by your health care provider.  Contact a health care provider if:  Your pain gets worse after it had improved, and it is not relieved with pain medicines.  You have any questions about caring for your drainage catheter or collection bag.  You have any of these around your catheter insertion site or coming from it: ? Skin breakdown. ? Redness, swelling, or pain. ? Fluid or blood. ? Warmth to the touch. ? Pus or a bad smell. Get help right away if:  You have a fever or chills.  Your redness, swelling, or pain at the catheter insertion site gets worse, even though you are cleaning it well.  You have leakage of bile around the drainage catheter.  Your drainage catheter becomes blocked or clogged.  Your drainage catheter comes out. This information is not  intended to replace advice given to you by your health care provider. Make sure you discuss any questions you have with your health care provider. Document Released: 07/23/2013 Document Revised: 08/21/2016 Document Reviewed: 08/21/2016 Elsevier Interactive Patient Education  2017 [employer].

## 2017-12-31 NOTE — Progress Notes (Signed)
Patient had 7beat run of Vtach per telemetry. VS obtained. Pt asymptomatic. Tylene Fantasia NP made aware. Will continue to monitor.

## 2017-12-31 NOTE — Progress Notes (Signed)
Overlay mattress not available but will be coming in tonight per portable equipment. Reported off to night shift nurse to F/U with plan of care.

## 2017-12-31 NOTE — Progress Notes (Signed)
CC:  Liver abscess/cholecystitis  Subjective: Having trouble swallowing, cholecystostomy tube twisted 360 and not working he has skin tears with tape over them. I have fixed the tube and the second drain looks fine.    Objective: Vital signs in last 24 hours: Temp:  [98.1 F (36.7 C)-99.9 F (37.7 C)] 99.8 F (37.7 C) (03/18 0500) Pulse Rate:  [64-173] 106 (03/18 0500) Resp:  [18-20] 18 (03/18 0500) BP: (106-125)/(36-79) 125/79 (03/18 0500) SpO2:  [92 %-97 %] 97 % (03/18 0500) Weight:  [96.6 kg (212 lb 15.4 oz)] 96.6 kg (212 lb 15.4 oz) (03/18 0621) Last BM Date: 12/30/17 425 PO 600 IV 950 urine 155 from cholecystostomy drain, (5 cc from liver drain) Afebrile, VSS No labs this AM   Intake/Output from previous day: 03/17 0701 - 03/18 0700 In: 1017 [P.O.:452; I.V.:130; IV Piggyback:400] Out: 1110 [Urine:950; Drains:160] Intake/Output this shift: No intake/output data recorded.  General appearance: alert, cooperative and more talkative today than last week.  Family in room not sure he is all that much better.  GI: soft, still sore with drains, both seem to work well when straight.  Lab Results:  Recent Labs    12/29/17 0437 12/30/17 0640  WBC 13.5* 11.6*  HGB 11.4* 11.2*  HCT 36.0* 35.1*  PLT 409* 387    BMET Recent Labs    12/29/17 0437 12/30/17 0640  NA 138 135  K 4.0 4.1  CL 104 101  CO2 26 24  GLUCOSE 168* 176*  BUN 20 20  CREATININE 1.00 1.23  CALCIUM 8.0* 7.9*   PT/INR No results for input(s): LABPROT, INR in the last 72 hours.  Recent Labs  Lab 12/25/17 1026  AST 113*  ALT 82*  ALKPHOS 108  BILITOT 0.8  PROT 7.2  ALBUMIN 2.1*     Lipase     Component Value Date/Time   LIPASE 33 02/27/2012 0500     Medications: . feeding supplement (GLUCERNA SHAKE)  237 mL Oral TID BM  . insulin aspart  0-20 Units Subcutaneous TID AC & HS  . insulin glargine  15 Units Subcutaneous QHS  . lactobacillus  1 g Oral Q breakfast  . metoprolol  tartrate  25 mg Oral BID  . nutrition supplement (JUVEN)  1 packet Oral BID WC  . OLANZapine  5 mg Oral QHS  . sodium chloride flush  5 mL Intracatheter Q12H   Specimen Description ABSCESS PERIHEPATIC  Performed at [facility] [address], [place], [place] [address]     Special Requests NONE SAMPLE B   Gram Stain ABUNDANT WBC PRESENT, PREDOMINANTLY PMN  RARE GRAM POSITIVE COCCI     Culture RARE ENTEROCOCCUS FAECALIS  RARE GRAM NEGATIVE RODS  MODERATE BACTEROIDES FRAGILIS  BETA LACTAMASE POSITIVE  Performed at [facility], [place] [address], [place], [place] [address]     Report Status PENDING   Organism ID, Bacteria ENTEROCOCCUS FAECALIS    Specimen Description GALL BLADDER   Special Requests SAMPLE A   Gram Stain ABUNDANT WBC PRESENT, PREDOMINANTLY PMN  FEW GRAM POSITIVE COCCI  FEW GRAM NEGATIVE RODS     Culture ABUNDANT ENTEROCOCCUS FAECALIS  ABUNDANT ESCHERICHIA COLI     Blood culture Staphylococcus species NOT DETECTED DETECTED Abnormal     Comment: Methicillin (oxacillin) resistant coagulase negative staphylococcus. Possible blood culture contaminant (unless isolated from more than one blood culture draw or clinical case suggests pathogenicity). No antibiotic treatment is indicated for blood  culture contaminants.  CRITICAL RESULT CALLED TO, READ BACK BY AND VERIFIED WITH:  Lavell Luster [facility] 12/26/17 0640 JDW   Staphylococcus aureus NOT DETECTED NOT DETECTED  NEGATIVE R, CM  Methicillin resistance NOT DETECTED DETECTED Abnormal      Anti-infectives (From admission, onward)   Start     Dose/Rate Route Frequency Ordered Stop   12/30/17 1230  imipenem-cilastatin (PRIMAXIN) 500 mg in sodium chloride 0.9 % 100 mL IVPB     500 mg 200 mL/hr over 30 Minutes Intravenous Every 6 hours 12/30/17 1132     12/28/17 1400  piperacillin-tazobactam (ZOSYN) IVPB 3.375 g  Status:  Discontinued     3.375 g 12.5 mL/hr over 240 Minutes Intravenous Every 8 hours  12/28/17 0851 12/30/17 1127   12/28/17 1000  ciprofloxacin (CIPRO) IVPB 200 mg  Status:  Discontinued     200 mg 100 mL/hr over 60 Minutes Intravenous Every 12 hours 12/28/17 0833 12/28/17 0848   12/27/17 2200  meropenem (MERREM) 1 g in sodium chloride 0.9 % 100 mL IVPB  Status:  Discontinued     1 g 200 mL/hr over 30 Minutes Intravenous Every 8 hours 12/27/17 1208 12/28/17 0833   12/25/17 2200  linezolid (ZYVOX) IVPB 600 mg  Status:  Discontinued     600 mg 300 mL/hr over 60 Minutes Intravenous Every 12 hours 12/25/17 1133 12/28/17 0833   12/25/17 2200  meropenem (MERREM) 1 g in sodium chloride 0.9 % 100 mL IVPB  Status:  Discontinued     1 g 200 mL/hr over 30 Minutes Intravenous Every 12 hours 12/25/17 1133 12/27/17 1208   12/25/17 1115  linezolid (ZYVOX) IVPB 600 mg     600 mg 300 mL/hr over 60 Minutes Intravenous STAT 12/25/17 1108 12/25/17 1346   12/25/17 1115  meropenem (MERREM) 1 g in sodium chloride 0.9 % 100 mL IVPB     1 g 200 mL/hr over 30 Minutes Intravenous STAT 12/25/17 1108 12/25/17 1244       Assessment/Plan Bilateral pneumonia/atelectasis Acute kidney disease Systolic heart failure with an EF 35%/Mild congestive failure Possible recurrent pneumonia Altered mental status-acute on chronic metabolic encephalopathy. Failure to thrive/significantly decreased mobility PAD with right medial heel ulcer DNR  Sepsis/Lactic acidosis Bacteremia - MRSA Liver abscess-subdiaphragmatic fluid collection/possible acute cholecystitis IR perihepatic abscess drainage; percutaneous cholecystostomy 12/26/17  - drain cultures:  - Enterococcus fecalis/E Coli on GB and liver abscess cultures - antibiotics have been      adjusted.    FEN:IV fluids/full liquids ID: Linezolid 3/12- 3/14/Meropenem 3/12 - 3/15; Zosyn 3/15- 3/17;  Primaxin 3/17 =>> day 2 DVT: SCD - He can have DVT prophylaxis from our standpoint Foley: In Follow up: Dr. Marlou Starks after drains are removed about 6  weeks  Plan:  No real surgical issues currently, Keep drains until IR is ready to remove them and then follow up with Dr. Marlou Starks after drains are out.  Pt is seeing Palliative also, and agree he could benefit from swallowing study.  I will get wound nurse to help them do dressings and prevent or worsen skin tears.  I have left Dr. Ethlyn Gallery contact information in the AVS.         LOS: 6 days    Katheleen Stella 12/31/2017 [phone]

## 2017-12-31 NOTE — Progress Notes (Signed)
Referring Physician(s): Toth,P  Supervising Physician: Markus Daft  Patient Status:  [facility] - In-pt  Chief Complaint:  cholecystitis  Subjective: Pt still appears weak; cont to have abd discomfort; has hiccups, some dysphagia reportedly   Allergies: Patient has no known allergies.  Medications: Prior to Admission medications   Medication Sig Start Date End Date Taking? Authorizing Provider  acetaminophen (TYLENOL) 500 MG tablet Take 1,000 mg by mouth every 8 (eight) hours as needed for moderate pain.  09/28/17  Yes [provider]  albuterol (ACCUNEB) 0.63 MG/3ML nebulizer solution Take 3 mLs (0.63 mg total) by nebulization every 2 (two) hours as needed for wheezing or shortness of breath. 11/17/17  Yes Ghimire, Henreitta Leber, MD  albuterol (PROVENTIL) (2.5 MG/3ML) 0.083% nebulizer solution Take 3 mLs (2.5 mg total) by nebulization 3 (three) times daily. 11/17/17  Yes Ghimire, Henreitta Leber, MD  Amino Acids-Protein Hydrolys (FEEDING SUPPLEMENT, PRO-STAT SUGAR FREE 64,) LIQD Take 30 mLs 2 (two) times daily by mouth.   Yes [provider]  atorvastatin (LIPITOR) 10 MG tablet Take 10 mg by mouth at bedtime. 10/11/17  Yes [provider]  benzonatate (TESSALON) 200 MG capsule Take 1 capsule (200 mg total) by mouth 3 (three) times daily as needed for cough. 11/17/17  Yes Ghimire, Henreitta Leber, MD  Cholecalciferol (VITAMIN D3) 10000 units TABS Take 1,000 Units by mouth daily.   Yes [provider]  docusate sodium (COLACE) 100 MG capsule Take 1 capsule (100 mg total) by mouth 2 (two) times daily. 04/10/17  Yes Reyne Dumas, MD  ferrous sulfate 325 (65 FE) MG tablet Take 325 mg by mouth daily with breakfast.    Yes [provider]  furosemide (LASIX) 40 MG tablet Take 40 mg by mouth daily.    Yes [provider]  gabapentin (NEURONTIN) 300 MG capsule Take 300 mg by mouth 3 (three) times daily.    Yes [provider]  insulin aspart (NOVOLOG)  100 UNIT/ML injection Inject 15 Units into the skin 3 (three) times daily with meals. Inject 11 units subcutaneously after meals 10/26/17  Yes [provider]  insulin glargine (LANTUS) 100 UNIT/ML injection Inject 57 Units into the skin at bedtime.    Yes [provider]  ipratropium-albuterol (DUONEB) 0.5-2.5 (3) MG/3ML SOLN Take 3 mLs by nebulization every 6 (six) hours as needed (sob and wheezing). X 3 days and every 6 hours as needed x 14 days  12/24/17 01/07/18 Yes [provider]  lisinopril (PRINIVIL,ZESTRIL) 5 MG tablet Take 5 mg by mouth daily.   Yes [provider]  metoprolol succinate (TOPROL-XL) 25 MG 24 hr tablet Take 25 mg by mouth daily.   Yes [provider]  Multiple Vitamin (MULTIVITAMIN) tablet Take 1 tablet by mouth daily.   Yes [provider]  Nutritional Supplements (NUTRITIONAL SHAKE PO) House Shake - Give 120cc by mouth three times daily   Yes [provider]  ondansetron (ZOFRAN) 4 MG tablet Take 4 mg by mouth every 6 (six) hours as needed for nausea or vomiting. 12/19/17  Yes [provider]  pantoprazole (PROTONIX) 40 MG tablet Take 40 mg by mouth daily.   Yes [provider]  polyethylene glycol (MIRALAX / GLYCOLAX) packet Take 17 g by mouth 2 (two) times daily.    Yes [provider]  potassium chloride SA (K-DUR,KLOR-CON) 20 MEQ tablet Take 1 tablet (20 mEq total) by mouth daily. 11/18/17  Yes Ghimire, Henreitta Leber, MD  Probiotic Product (  PROBIOTIC DAILY) CAPS Take 1 capsule by mouth daily. 12/21/17 01/20/18 Yes [provider]  promethazine (PHENERGAN) 25 MG suppository Place 25 mg rectally every 6 (six) hours as needed for nausea or vomiting. X 3 days 12/24/17 12/27/17 Yes [provider]  Skin Protectants, Misc. (CALAZIME SKIN PROTECTANT EX) Apply 1 application topically 2 (two) times daily. Apply to buttock every shift    Yes [provider]  Skin Protectants, Misc.  (EUCERIN) cream Apply to BLE topically two times daily for dry skin   Yes [provider]  traMADol (ULTRAM) 50 MG tablet Take 25 mg by mouth every 12 (twelve) hours as needed for moderate pain.    Yes [provider]  traZODone (DESYREL) 100 MG tablet Take 100 mg by mouth at bedtime as needed for sleep.   Yes [provider]  LevoFLOXacin in D5W (LEVAQUIN IV) Inject 750 mg into the vein. Every other day for 7 administrations 12/21/17 01/04/18  [provider]  lisinopril (ZESTRIL) 2.5 MG tablet Take 2 tablets (5 mg total) by mouth daily. Patient not taking: Reported on 12/25/2017 11/17/17 11/17/18  Jonetta Osgood, MD     Vital Signs: BP 115/60 (BP Location: Left Arm)   Pulse (!) 121   Temp 98.2 F (36.8 C) (Oral)   Resp (!) 21   Ht 5\' 11"  (1.803 m)   Wt 212 lb 15.4 oz (96.6 kg)   SpO2 91%   BMI 29.70 kg/m   Physical Exam perihepatic/GB drain intact, outputs 5/155 cc respectively, insertion sites ok, no leaking; sites mildly tender  Imaging: No results found.  Labs:  CBC: Recent Labs    12/27/17 1115 12/28/17 0339 12/29/17 0437 12/30/17 0640  WBC 18.7* 15.9* 13.5* 11.6*  HGB 11.4* 11.6* 11.4* 11.2*  HCT 34.8* 35.3* 36.0* 35.1*  PLT 376 353 409* 387    COAGS: Recent Labs    04/06/17 1636 11/15/17 0414 12/25/17 1121  INR 0.96 1.15 1.21  APTT  --  31  --     BMP: Recent Labs    12/27/17 1115 12/28/17 0339 12/29/17 0437 12/30/17 0640  NA 138 136 138 135  K 4.0 3.5 4.0 4.1  CL 106 104 104 101  CO2 23 23 26 24   GLUCOSE 274* 136* 168* 176*  BUN 30* 23* 20 20  CALCIUM 8.1* 7.9* 8.0* 7.9*  CREATININE 1.28* 1.13 1.00 1.23  GFRNONAA 51* 59* >60 53*  GFRAA 59* >60 >60 >60    LIVER FUNCTION TESTS: Recent Labs    11/14/17 2130 11/15/17 0414 11/16/17 0550 12/25/17 1026  BILITOT 0.6 0.8 0.5 0.8  AST 47* 31 53* 113*  ALT 36 29 45 82*  ALKPHOS 98 75 71 108  PROT 8.0 6.6 6.7 7.2  ALBUMIN 2.9* 2.4* 2.4* 2.1*     Assessment and Plan: Patient with history of acute cholecystitis/perihepatic fluid collection, status post percutaneous cholecystostomy and perihepatic fluid drainage on 12/26/17; temp 99.8; no new labs; blood cx- neg to date; drain fluid cx- enterococcus/e coli/bacteroides; rec f/u CT in next 48 hrs to assess adequacy of perihepatic drainage; will need to keep GB drain in place at least 4-6 weeks- can schedule f/u cholangiogram 4 weeks post placement     Electronically Signed: D. Rowe Robert, PA-C 12/31/2017, 2:49 PM   I spent a total of 15 minutes at the the patient's bedside AND on the patient's hospital floor or unit, greater than 50% of which was counseling/coordinating care for perihepatic/gallbladder drains  Patient ID: Preston Moore, male   DOB: 07-25-1934, 84 y.o.   MRN: [id number]

## 2017-12-31 NOTE — Progress Notes (Signed)
Palliative Care Progress Note  Met again with family including 4 daughters.  Family report that he is still not near baseline and not eating.  Discussed with family findings of thrush and plan for repeat imaging on Wednesday.    Also discussed concern that he may continue to decline in regard to nutrition and functional status and family concern for his overall decline.  Will ask another member of PMT to follow up with family in next day or two.  Total time: 30 minutes. Greater than 50%  of this time was spent counseling and coordinating care related to the above assessment and plan.  Micheline Rough, MD [place] Team [phone]

## 2017-12-31 NOTE — Evaluation (Addendum)
Clinical/Bedside Swallow Evaluation Patient Details  Name: Preston Moore MRN: [id number] Date of Birth: 1934/08/31  Today's Date: 12/31/2017 Time: SLP Start Time (ACUTE ONLY): 1210 SLP Stop Time (ACUTE ONLY): 1225 SLP Time Calculation (min) (ACUTE ONLY): 15 min  Past Medical History:  Past Medical History:  Diagnosis Date  . Atrial flutter ([place])   . B12 deficiency 02/22/2012  . Closed right hip fracture, initial encounter ([place]) 04/06/2017  . Diabetes mellitus   . Hyperlipidemia   . Hypertension   . Ileus ([place]) 02/29/2012  . Malnutrition ([place]) 02/29/2012  . Neuropathy   . Non compliance with medical treatment 02/29/2012  . Pancreatitis 02/22/2012  . SBO (small bowel obstruction) ([place]) 02/23/2012  . Syncope and collapse    Past Surgical History:  Past Surgical History:  Procedure Laterality Date  . BACK SURGERY    . COMPRESSION HIP SCREW Right 04/07/2017   Procedure: RIGHT HIP INTRATROCHANTERIC NAILING;  Surgeon: [name], MD;  Location: [facility];  Service: Orthopedics;  Laterality: Right;  . IR IMAGE GUIDED DRAINAGE PERCUT CATH  PERITONEAL RETROPERIT  12/26/2017  . IR PERC CHOLECYSTOSTOMY  12/26/2017  . LAMINECTOMY  1965   HPI:  84 yo male adm to [facility] hospital - required cholecystotomy drain - PMH + for closed hip fx and has been bed bound since per chart.  Pt found to have pna and has problems swallowing. MD ordered swallow evaluation.    Assessment / Plan / Recommendation Clinical Impression  Pt presents with suspicion for oral candidiasis evidenced by whitish patches on tongue which did not clear with oral care.  Oral suction set up and care provided prior to po administration.  Pt demonstrated suspected delayed oral transiting/oral holding - benefiting from verbal cues to swallow.  He did not demonstrate s/s of aspiration with ice, thin via straw,or applesauce.  Voice is weak and wet which may be indicative of secretion aspiration.  Due to oral holding with purees, did not test  solids.  Concern for possible oral candidiasis impacting pt's intake.  Weak cough noted that increases his aspiration risk.   Recommend pt have full liquid diet with strict precautions - oral suction before and after po.   SlP notified MD and RN of finding/recommendations.  Will follow up with pt for tolerance, indication for further testing.  No family present to establish baseline but hope with medical improvement, pt will demonstrate improved swallow.    SLP Visit Diagnosis: Dysphagia, oropharyngeal phase (R13.12)(possible pharyngeal)    Aspiration Risk  Moderate aspiration risk    Diet Recommendation Thin liquid(full liquids)   Liquid Administration via: Cup;Straw Medication Administration: (as tolerated) Supervision: Full supervision/cueing for compensatory strategies Compensations: Slow rate;Small sips/bites;Other (Comment)(oral suction before and after intake) Postural Changes: Seated upright at 90 degrees;Remain upright for at least 30 minutes after po intake    Other  Recommendations Oral Care Recommendations: Oral care before and after PO   Follow up Recommendations        Frequency and Duration min 1 x/week  1 week       Prognosis Prognosis for Safe Diet Advancement: Fair Barriers to Reach Goals: Cognitive deficits      Swallow Study   General Date of Onset: 12/31/17 HPI: 84 yo male adm to [facility] hospital - required cholecystotomy drain - PMH + for closed hip fx and has been bed bound since per chart.  Pt found to have pna and has problems swallowing. MD ordered swallow evaluation.  Type of Study: Bedside Swallow  Evaluation Diet Prior to this Study: Dysphagia 3 (soft);Thin liquids Temperature Spikes Noted: Yes(99.9) Respiratory Status: Nasal cannula History of Recent Intubation: No Behavior/Cognition: Alert Oral Care Completed by SLP: Yes Oral Cavity - Dentition: Adequate natural dentition Vision: Functional for self-feeding Self-Feeding Abilities: Total  assist Patient Positioning: Partially reclined Baseline Vocal Quality: Low vocal intensity Volitional Cough: Weak Volitional Swallow: Unable to elicit    Oral/Motor/Sensory Function Overall Oral Motor/Sensory Function: Generalized oral weakness   Ice Chips Ice chips: Impaired Presentation: Spoon Oral Phase Functional Implications: Prolonged oral transit Pharyngeal Phase Impairments: Suspected delayed Swallow   Thin Liquid Thin Liquid: Impaired Presentation: Straw;Spoon Oral Phase Functional Implications: Prolonged oral transit(suspected)    Nectar Thick Nectar Thick Liquid: Not tested   Honey Thick Honey Thick Liquid: Not tested   Puree Puree: Impaired Presentation: Spoon Oral Phase Impairments: Reduced lingual movement/coordination Oral Phase Functional Implications: Prolonged oral transit Other Comments: pt required oral cues to swallow, did not test solids due to oral deficits with liquids alone and rn report of pt having problems with eggs   Solid   GO   Solid: Not tested        Macario Golds 12/31/2017,12:34 PM  Luanna Salk, [facility] SLP [phone]

## 2017-12-31 NOTE — Consult Note (Addendum)
[place] Nurse wound consult note Reason for Consult: bilateral Unstageable pressure injuries (2 on the right heel, 1 on the left heel), MASD with fungal overgrowth to the bilateral inguinal, scrotal perianal and intragluteal cleft. Wound type:Pressure, moisture Pressure Injury POA: Yes Measurement: Left heel with 0.5cm x 2cm area of brown discoloration Right medial heel with 2cm x 2.2cm area of eschar (Unstageable PrI) Right lateral heel with 1.5cm x 1cm with depth unable to be determined due to the presence of yellow slough (Unstageable) Abdomen:  Skin tear related to medical adhesives measuring 2cm x 2.2cm x 0.1cm (pink, moist, partial thickness) Bilateral inguinal, scrotal, intragluteal cleft and perianal areas with erythema, scattered partial thickness tissue loss and satellite lesions consistent with fungal overgrowth. Wound bed:As described above Drainage (amount, consistency, odor) As described above Periwound:intact, very dry Dressing procedure/placement/frequency: I will provide a mattress replacement, patient has bilateral Prevalon boots.  We will implement 4 times daily treatment with Gerhart's Butt cream and turning from side to side to maximize air flow.  Guidance is provided for care to peritube and bilateral heels using white petrolatum and dry gauze with twice daily changes. Patient would benefit from systemic treatment with antifungal, e.g. Diflucan.  If you agree, please order.  [place] nursing team will not follow, but will remain available to this patient, the nursing and medical teams.  Please re-consult if needed. Thanks, Maudie Flakes, MSN, RN, [place], Arther Abbott  Pager# [phone]

## 2017-12-31 NOTE — Progress Notes (Signed)
Peripherally Inserted Central Catheter/Midline Placement  The IV Nurse has discussed with the patient and/or persons authorized to consent for the patient, the purpose of this procedure and the potential benefits and risks involved with this procedure.  The benefits include less needle sticks, lab draws from the catheter, and the patient may be discharged home with the catheter. Risks include, but not limited to, infection, bleeding, blood clot (thrombus formation), and puncture of an artery; nerve damage and irregular heartbeat and possibility to perform a PICC exchange if needed/ordered by physician.  Alternatives to this procedure were also discussed.  Bard Power PICC patient education guide, fact sheet on infection prevention and patient information card has been provided to patient /or left at bedside.    PICC/Midline Placement Documentation  PICC Single Lumen 53/66/44 PICC Left Basilic 52 cm 0 cm (Active)  Indication for Insertion or Continuance of Line Home intravenous therapies (PICC only) 12/31/2017  4:24 PM  Exposed Catheter (cm) 0 cm 12/31/2017  4:24 PM  Site Assessment Clean;Dry;Intact 12/31/2017  4:24 PM  Line Status Flushed;Saline locked;Blood return noted 12/31/2017  4:24 PM  Dressing Type Transparent;Securing device 12/31/2017  4:24 PM  Dressing Status Clean;Dry;Intact;Antimicrobial disc in place 12/31/2017  4:24 PM  Dressing Change Due 01/07/18 12/31/2017  4:24 PM       Frances Maywood 12/31/2017, 4:27 PM

## 2017-12-31 NOTE — Progress Notes (Addendum)
PROGRESS NOTE    Preston Moore  EVO:[id number] DOB: [birth year] DOA: 12/25/2017 PCP: Gildardo Cranker, DO    Brief Narrative:  84 year old male who presented with tachycardia. Patient does have a significant past medical history of systolic heart failure, bilateral hip replacement about 2 years ago, bedbound and history of bilateral heel ulcers. He is a nursing home resident, he was noted to have significant tachycardia, fever and hypotension. On the initial physical examination blood pressure 109/69, heart rate 110, temperature 98.2, respiratory 26, dry mucous membranes, lungs were clear to auscultation bilaterally, decreased at bases, heart S1-S2 present, rhythmic, abdomen was soft nontender, no lower extremity edema.Sodium 135, potassium 4.4, chloride 100, bicarb 22, glucose 343, BUN 37, creatinine 1.43, venous lactic acid 4.3, white count 16.3, hemoglobin 13.0, hematocrit 39.7, platelets 356,urine analysis 50 glucose, pH 5.0, protein 30, specific gravity 1.028, RBC 0-5, white cells 0-5,positive granular and hyaline casts.Abdominal CT with several low-attenuation foci within the medial segment of the left lobe of the liver, contiguous to the gallbladder, suspicion for small liver abscess, subdiaphragmatic fluid collection is identified as suspicious for abscess.Chest x-ray hypoinflated, left rotation, increased interstitial markings bilaterally.EKG atrial fibrillation, poor baseline, 131 bpm, normal axis.  Patient was admitted to the hospital with a working diagnosis of sepsis due to liver abscess, acalculous cholecystitis,complicated by acute kidney injury in atrial fibrillation with rapid ventricular response.  Assessment & Plan:   Active Problems:   Septic shock (HCC)   Protein-calorie malnutrition, severe   Pressure injury of skin   1. Resolving sepsis due to acalculous cholecystitis and liver abscess/ enterococcus and ESBL E coli.Will continue with IV imipenem, will need  PICC line, expected long course of antibiotic therapy, consulted Dr. Johnnye Sima for further recommendations. Wbc continue to trend down, today at 11,6, patient has been afebrile.   2. Atrial fibrillation and flutter. On metoprolol to 25 mg po bid, intermittent episodes of tachycardia, heart rate  91 to 120  3. Systolic heart failure, EF 30 to 35%(with no decompensation) Continue b blockade with metoprolol, off iV fluids, will continue to hold on antihypertensive agents due to risk of hypotension.   4. AKI.patient with poor oral intake, will continue to follow on renal panel in am, hold on IV fluids, avoid hypotension or nephrotoxic medications.   5. HTN.Off antihypertensive agents.   6. COPD.No clinica signs ofacute exacerbation.  7. T2DM.continue glucose cover and monitoring with insulin sliding scale.Capillary glucose 231, 186, 195, 181, 331. Patient note to have difficulty swallowing, will follow on speech therapy recommendations. 15 units of basal insulin with glargine.    8. Encephalopathy. Continue supportive medical therapy, suspected recovering icu encephalopathy, delirium, tolerating well zyprexa at night.   DVT prophylaxis:scd Code Status:dnr Family Communication:I spoke with patient's family at the bedside and all questions were addressed.  Disposition Plan:SNF with picc line in place, follow with ID recommendations.    Consultants:    Procedures:    Antimicrobials: Imipenem IV   Subjective: Patient with no nausea or vomiting, noted to have difficulty swallowing, no chest pain or dyspnea.   Objective: Vitals:   12/31/17 0500 12/31/17 0621 12/31/17 1039 12/31/17 1402  BP: 125/79  (!) 145/81 115/60  Pulse: (!) 106  (!) 121   Resp: 18   (!) 21  Temp: 99.8 F (37.7 C)   98.2 F (36.8 C)  TempSrc: Axillary   Oral  SpO2: 97%   91%  Weight:  96.6 kg (212 lb 15.4 oz)    Height:  Intake/Output Summary (Last 24 hours) at  12/31/2017 1456 Last data filed at 12/31/2017 1401 Gross per 24 hour  Intake 730 ml  Output 1060 ml  Net -330 ml   Filed Weights   12/29/17 0609 12/30/17 0434 12/31/17 7353  Weight: 99.7 kg (219 lb 12.8 oz) 99.5 kg (219 lb 5.7 oz) 96.6 kg (212 lb 15.4 oz)    Examination:   General: Not in pain or dyspnea, deconditioned Neurology: Awake and alert, non focal  E ENT: mild pallor, no icterus, oral mucosa moist Cardiovascular: No JVD. S1-S2 present, rhythmic, no gallops, rubs, or murmurs. No lower extremity edema. Pulmonary: decreased breath sounds bilaterally at bases, adequate air movement, no wheezing, rhonchi or rales. Gastrointestinal. Abdomen protuberant, no organomegaly, non tender, no rebound or guarding Skin. No rashes Musculoskeletal: no joint deformities     Data Reviewed: I have personally reviewed following labs and imaging studies  CBC: Recent Labs  Lab 12/25/17 1026 12/26/17 0554 12/27/17 1115 12/28/17 0339 12/29/17 0437 12/30/17 0640  WBC 16.3* 17.6* 18.7* 15.9* 13.5* 11.6*  NEUTROABS 14.8*  --   --  13.3* 10.3* 8.1*  HGB 13.0 11.6* 11.4* 11.6* 11.4* 11.2*  HCT 39.7 35.5* 34.8* 35.3* 36.0* 35.1*  MCV 87.4 87.7 86.8 86.9 88.0 86.9  PLT 356 370 376 353 409* 299   Basic Metabolic Panel: Recent Labs  Lab 12/26/17 0554 12/27/17 1115 12/28/17 0339 12/29/17 0437 12/30/17 0640  NA 141 138 136 138 135  K 3.8 4.0 3.5 4.0 4.1  CL 109 106 104 104 101  CO2 21* 23 23 26 24   GLUCOSE 151* 274* 136* 168* 176*  BUN 34* 30* 23* 20 20  CREATININE 1.37* 1.28* 1.13 1.00 1.23  CALCIUM 8.4* 8.1* 7.9* 8.0* 7.9*  MG 1.5* 2.0 2.0  --   --   PHOS 2.1* 2.2*  --   --   --    GFR: Estimated Creatinine Clearance: 55.8 mL/min (by C-G formula based on SCr of 1.23 mg/dL). Liver Function Tests: Recent Labs  Lab 12/25/17 1026  AST 113*  ALT 82*  ALKPHOS 108  BILITOT 0.8  PROT 7.2  ALBUMIN 2.1*   No results for input(s): LIPASE, AMYLASE in the last 168 hours. No  results for input(s): AMMONIA in the last 168 hours. Coagulation Profile: Recent Labs  Lab 12/25/17 1121  INR 1.21   Cardiac Enzymes: Recent Labs  Lab 12/25/17 1122 12/25/17 1446  TROPONINI 0.03* 0.03*   BNP (last 3 results) No results for input(s): PROBNP in the last 8760 hours. HbA1C: No results for input(s): HGBA1C in the last 72 hours. CBG: Recent Labs  Lab 12/30/17 1159 12/30/17 1730 12/30/17 2111 12/31/17 0737 12/31/17 1148  GLUCAP 231* 186* 195* 181* 331*   Lipid Profile: No results for input(s): CHOL, HDL, LDLCALC, TRIG, CHOLHDL, LDLDIRECT in the last 72 hours. Thyroid Function Tests: No results for input(s): TSH, T4TOTAL, FREET4, T3FREE, THYROIDAB in the last 72 hours. Anemia Panel: No results for input(s): VITAMINB12, FOLATE, FERRITIN, TIBC, IRON, RETICCTPCT in the last 72 hours.    Radiology Studies: I have reviewed all of the imaging during this hospital visit personally     Scheduled Meds: . feeding supplement (GLUCERNA SHAKE)  237 mL Oral TID BM  . Gerhardt's butt cream   Topical QID  . insulin aspart  0-20 Units Subcutaneous TID AC & HS  . insulin glargine  15 Units Subcutaneous QHS  . lactobacillus  1 g Oral Q breakfast  . metoprolol tartrate  25  mg Oral BID  . nutrition supplement (JUVEN)  1 packet Oral BID WC  . OLANZapine  5 mg Oral QHS  . sodium chloride flush  5 mL Intracatheter Q12H   Continuous Infusions: . sodium chloride    . imipenem-cilastatin Stopped (12/31/17 1401)     LOS: 6 days        Mauricio Gerome Apley, MD [facility] Pager [phone]

## 2017-12-31 NOTE — Progress Notes (Signed)
Palliative Care Progress Note  Met again with family including 3 daughters.  Family report that he is still not near baseline and not eating.  Discussed with family and they report that he does appear to be straining to swallow (report that it appears that he is using effort to make himself swallow) which is new.  Consider SLP evaluation.  I will d/w primary tomorrow.  We have set up another meeting for tomorrow at [facility] (assuming he does not get discharged)  Total time: 30 minutes. Greater than 50%  of this time was spent counseling and coordinating care related to the above assessment and plan.  Micheline Rough, MD [place] Team [phone]

## 2017-12-31 NOTE — Consult Note (Addendum)
[facility] Infectious Disease    Date of Admission:  12/25/2017   Total days of antibiotics: 6 merrem (day 1)               Reason for Consult: liver abscess, acalculous cholecystitis   Referring Provider: Arrien   Assessment: Liver abscess  acalculous cholecystitis Decubitus ulcers, heels Candidiasis oropharyngeal dysphagia DM2 Protein calorie malnutrition (severe)  Plan: 1. Continue merrem- ertapenem has gaps in enterococcal coverage.  2. Start diflucan, IV for now while swallowing is addressed 3. Await repeat imaging of his liver/abscesses.  4. WOC f/u 5. Nutrition f/u 6. Offloading- will defer to primary team, would consider air flow bed.   Comment- Appreciate Dr Kirstie Mirza excellent care. He and I spoke to family at length regarding patients care and his current state.    Thank you so much for this interesting consult,  Active Problems:   Septic shock ([place])   Protein-calorie malnutrition, severe   Pressure injury of skin   . feeding supplement (GLUCERNA SHAKE)  237 mL Oral TID BM  . Gerhardt's butt cream   Topical QID  . insulin aspart  0-20 Units Subcutaneous TID AC & HS  . insulin glargine  15 Units Subcutaneous QHS  . lactobacillus  1 g Oral Q breakfast  . metoprolol tartrate  25 mg Oral BID  . nutrition supplement (JUVEN)  1 packet Oral BID WC  . OLANZapine  5 mg Oral QHS  . sodium chloride flush  5 mL Intracatheter Q12H    HPI: Preston Moore is a 84 y.o. male with hx of bed bound post B THR (2017), DM2, CHF (EF 35% 11-2017) and returns from SNF on 3-12 with fever, tachycardia, and hypotension, lactate 4.3, WBC 21.7. Abdominal CT with several low-attenuation foci within the medial segment of the left lobe of the liver, contiguous to the gallbladder, suspicion for small liver abscess, subdiaphragmatic fluid collection is identified as suspicious for abscess.He had biliary and perihepatic drain placed (3-13), was started on zosyn.  By 3-17  pt's biliary Cx grew ESBL E coli. His abscess Cx grew E faecalis (pan-sens) and Bacteroides fragilis. His anbx were changed to merrem.  Today he is also found to have unstageable pressure ulcers on heels as well as fungal groin rash.     Review of Systems: Review of Systems  Unable to perform ROS: Acuity of condition    Past Medical History:  Diagnosis Date  . Atrial flutter ([place])   . B12 deficiency 02/22/2012  . Closed right hip fracture, initial encounter ([place]) 04/06/2017  . Diabetes mellitus   . Hyperlipidemia   . Hypertension   . Ileus ([place]) 02/29/2012  . Malnutrition ([place]) 02/29/2012  . Neuropathy   . Non compliance with medical treatment 02/29/2012  . Pancreatitis 02/22/2012  . SBO (small bowel obstruction) ([place]) 02/23/2012  . Syncope and collapse     Social History   Tobacco Use  . Smoking status: Current Every Day Smoker    Packs/day: 0.50    Years: 10.00    Pack years: 5.00    Types: Cigarettes  . Smokeless tobacco: Never Used  Substance Use Topics  . Alcohol use: Yes    Alcohol/week: 21.6 oz    Types: 36 Cans of beer per week  . Drug use: No    Family History  Problem Relation Age of Onset  . Hypertension Neg Hx   . Coronary artery disease Neg Hx   .  Diabetes Neg Hx      Medications:  Scheduled: . feeding supplement (GLUCERNA SHAKE)  237 mL Oral TID BM  . Gerhardt's butt cream   Topical QID  . insulin aspart  0-20 Units Subcutaneous TID AC & HS  . insulin glargine  15 Units Subcutaneous QHS  . lactobacillus  1 g Oral Q breakfast  . metoprolol tartrate  25 mg Oral BID  . nutrition supplement (JUVEN)  1 packet Oral BID WC  . OLANZapine  5 mg Oral QHS  . sodium chloride flush  5 mL Intracatheter Q12H    Abtx:  Anti-infectives (From admission, onward)   Start     Dose/Rate Route Frequency Ordered Stop   12/30/17 1230  imipenem-cilastatin (PRIMAXIN) 500 mg in sodium chloride 0.9 % 100 mL IVPB     500 mg 200 mL/hr over 30 Minutes Intravenous Every 6  hours 12/30/17 1132     12/28/17 1400  piperacillin-tazobactam (ZOSYN) IVPB 3.375 g  Status:  Discontinued     3.375 g 12.5 mL/hr over 240 Minutes Intravenous Every 8 hours 12/28/17 0851 12/30/17 1127   12/28/17 1000  ciprofloxacin (CIPRO) IVPB 200 mg  Status:  Discontinued     200 mg 100 mL/hr over 60 Minutes Intravenous Every 12 hours 12/28/17 0833 12/28/17 0848   12/27/17 2200  meropenem (MERREM) 1 g in sodium chloride 0.9 % 100 mL IVPB  Status:  Discontinued     1 g 200 mL/hr over 30 Minutes Intravenous Every 8 hours 12/27/17 1208 12/28/17 0833   12/25/17 2200  linezolid (ZYVOX) IVPB 600 mg  Status:  Discontinued     600 mg 300 mL/hr over 60 Minutes Intravenous Every 12 hours 12/25/17 1133 12/28/17 0833   12/25/17 2200  meropenem (MERREM) 1 g in sodium chloride 0.9 % 100 mL IVPB  Status:  Discontinued     1 g 200 mL/hr over 30 Minutes Intravenous Every 12 hours 12/25/17 1133 12/27/17 1208   12/25/17 1115  linezolid (ZYVOX) IVPB 600 mg     600 mg 300 mL/hr over 60 Minutes Intravenous STAT 12/25/17 1108 12/25/17 1346   12/25/17 1115  meropenem (MERREM) 1 g in sodium chloride 0.9 % 100 mL IVPB     1 g 200 mL/hr over 30 Minutes Intravenous STAT 12/25/17 1108 12/25/17 1244        OBJECTIVE: Blood pressure 115/60, pulse (!) 121, temperature 98.2 F (36.8 C), temperature source Oral, resp. rate (!) 21, height 5' 11"  (1.803 m), weight 96.6 kg (212 lb 15.4 oz), SpO2 91 %.  Physical Exam  Constitutional: He appears lethargic. He has a sickly appearance.  HENT:  Mouth/Throat: Oropharyngeal exudate present.  Eyes: EOM are normal.  Neck: Neck supple.  Cardiovascular: Normal rate, regular rhythm and normal heart sounds.  Pulmonary/Chest: Effort normal and breath sounds normal.  Abdominal: Soft. Bowel sounds are normal. There is no tenderness. There is no rebound.  Genitourinary:  Genitourinary Comments: Candidal rash, groin, bilateral.   Musculoskeletal:        Feet:  Lymphadenopathy:    He has no cervical adenopathy.  Neurological: He appears lethargic.  Psychiatric:  He awakens transiently. Does not focus.     Lab Results Results for orders placed or performed during the hospital encounter of 12/25/17 (from the past 48 hour(s))  Glucose, capillary     Status: Abnormal   Collection Time: 12/29/17  5:34 PM  Result Value Ref Range   Glucose-Capillary 196 (H) 65 - 99 mg/dL  Glucose,  capillary     Status: Abnormal   Collection Time: 12/29/17  9:00 PM  Result Value Ref Range   Glucose-Capillary 235 (H) 65 - 99 mg/dL  CBC with Differential/Platelet     Status: Abnormal   Collection Time: 12/30/17  6:40 AM  Result Value Ref Range   WBC 11.6 (H) 4.0 - 10.5 K/uL   RBC 4.04 (L) 4.22 - 5.81 MIL/uL   Hemoglobin 11.2 (L) 13.0 - 17.0 g/dL   HCT 35.1 (L) 39.0 - 52.0 %   MCV 86.9 78.0 - 100.0 fL   MCH 27.7 26.0 - 34.0 pg   MCHC 31.9 30.0 - 36.0 g/dL   RDW 15.3 11.5 - 15.5 %   Platelets 387 150 - 400 K/uL   Neutrophils Relative % 71 %   Neutro Abs 8.1 (H) 1.7 - 7.7 K/uL   Lymphocytes Relative 21 %   Lymphs Abs 2.5 0.7 - 4.0 K/uL   Monocytes Relative 7 %   Monocytes Absolute 0.8 0.1 - 1.0 K/uL   Eosinophils Relative 1 %   Eosinophils Absolute 0.2 0.0 - 0.7 K/uL   Basophils Relative 0 %   Basophils Absolute 0.0 0.0 - 0.1 K/uL    Comment: Performed at [facility], [place] [address] [place], [place] [address]  Basic metabolic panel     Status: Abnormal   Collection Time: 12/30/17  6:40 AM  Result Value Ref Range   Sodium 135 135 - 145 mmol/L   Potassium 4.1 3.5 - 5.1 mmol/L   Chloride 101 101 - 111 mmol/L   CO2 24 22 - 32 mmol/L   Glucose, Bld 176 (H) 65 - 99 mg/dL   BUN 20 6 - 20 mg/dL   Creatinine, Ser 1.23 0.61 - 1.24 mg/dL   Calcium 7.9 (L) 8.9 - 10.3 mg/dL   GFR calc non Af Amer 53 (L) >60 mL/min   GFR calc Af Amer >60 >60 mL/min    Comment: (NOTE) The eGFR has been calculated using the CKD EPI equation. This  calculation has not been validated in all clinical situations. eGFR's persistently <60 mL/min signify possible Chronic Kidney Disease.    Anion gap 10 5 - 15    Comment: Performed at [facility], [place] [address], [place], [place] [address]  Glucose, capillary     Status: Abnormal   Collection Time: 12/30/17  8:03 AM  Result Value Ref Range   Glucose-Capillary 196 (H) 65 - 99 mg/dL  Glucose, capillary     Status: Abnormal   Collection Time: 12/30/17 11:59 AM  Result Value Ref Range   Glucose-Capillary 231 (H) 65 - 99 mg/dL  Glucose, capillary     Status: Abnormal   Collection Time: 12/30/17  5:30 PM  Result Value Ref Range   Glucose-Capillary 186 (H) 65 - 99 mg/dL  Glucose, capillary     Status: Abnormal   Collection Time: 12/30/17  9:11 PM  Result Value Ref Range   Glucose-Capillary 195 (H) 65 - 99 mg/dL  Glucose, capillary     Status: Abnormal   Collection Time: 12/31/17  7:37 AM  Result Value Ref Range   Glucose-Capillary 181 (H) 65 - 99 mg/dL  Glucose, capillary     Status: Abnormal   Collection Time: 12/31/17 11:48 AM  Result Value Ref Range   Glucose-Capillary 331 (H) 65 - 99 mg/dL      Component Value Date/Time   SDES  12/28/2017 1021    BLOOD RIGHT HAND Performed at  [facility], [place] [address], [place], [place] [address]    SPECREQUEST  12/28/2017 1021    BOTTLES DRAWN AEROBIC ONLY Blood Culture adequate volume Performed at [facility] [address], [place], [place] [address]    CULT  12/28/2017 1021    NO GROWTH 3 DAYS Performed at [facility] [address] [place], [place] [address]    REPTSTATUS PENDING 12/28/2017 1021   [place] Ekg Site Rite  Result Date: 12/31/2017 If [facility] image not attached, placement could not be confirmed due to current cardiac rhythm.  Recent Results (from the past 240 hour(s))  Culture, blood (Routine x 2)     Status: None   Collection Time: 12/25/17 10:18 AM   Result Value Ref Range Status   Specimen Description   Final    BLOOD LEFT WRIST Performed at [facility] [address], [place], [place] [address]    Special Requests   Final    IN PEDIATRIC BOTTLE Blood Culture adequate volume Performed at [facility] [address], [place], [place] [address]    Culture   Final    NO GROWTH 5 DAYS Performed at [facility], [place] [address], [place], [place] [address]    Report Status 12/30/2017 FINAL  Final  Culture, blood (Routine x 2)     Status: Abnormal   Collection Time: 12/25/17 10:26 AM  Result Value Ref Range Status   Specimen Description   Final    BLOOD RIGHT HAND Performed at [facility] [address], [place], [place] [address]    Special Requests   Final    IN PEDIATRIC BOTTLE Blood Culture adequate volume Performed at [facility] [address], [place],  [address]    Culture  Setup Time   Final    GRAM POSITIVE COCCI IN PEDIATRIC BOTTLE CRITICAL RESULT CALLED TO, READ BACK BY AND VERIFIED WITH: J GRIMSLEY [facility] 12/26/17 0640 JDW    Culture (A)  Final    STAPHYLOCOCCUS SPECIES (COAGULASE NEGATIVE) THE SIGNIFICANCE OF ISOLATING THIS ORGANISM FROM A SINGLE SET OF BLOOD CULTURES WHEN MULTIPLE SETS ARE DRAWN IS UNCERTAIN. PLEASE NOTIFY THE MICROBIOLOGY DEPARTMENT WITHIN ONE WEEK IF SPECIATION AND SENSITIVITIES ARE REQUIRED. Performed at [facility], [place] [address] [place],  [address]    Report Status 12/27/2017 FINAL  Final  Blood Culture ID Panel (Reflexed)     Status: Abnormal   Collection Time: 12/25/17 10:26 AM  Result Value Ref Range Status   Enterococcus species NOT DETECTED NOT DETECTED Final   Listeria monocytogenes NOT DETECTED NOT DETECTED Final   Staphylococcus species DETECTED (A) NOT DETECTED Final    Comment: Methicillin (oxacillin) resistant coagulase negative staphylococcus. Possible blood culture  contaminant (unless isolated from more than one blood culture draw or clinical case suggests pathogenicity). No antibiotic treatment is indicated for blood  culture contaminants. CRITICAL RESULT CALLED TO, READ BACK BY AND VERIFIED WITH: J GRIMSLEY PHAR[facility] 12/26/17 0640 JDW    Staphylococcus aureus NOT DETECTED NOT DETECTED Final   Methicillin resistance DETECTED (A) NOT DETECTED Final    Comment: CRITICAL RESULT CALLED TO, READ BACK BY AND VERIFIED WITH: J GRIMSLEY PHARMD 12/26/17 0640 JDW    Streptococcus species NOT DETECTED NOT DETECTED Final   Streptococcus agalactiae NOT DETECTED NOT DETECTED Final   Streptococcus pneumoniae NOT DETECTED NOT DETECTED Final   Streptococcus pyogenes NOT DETECTED NOT DETECTED Final  Acinetobacter baumannii NOT DETECTED NOT DETECTED Final   Enterobacteriaceae species NOT DETECTED NOT DETECTED Final   Enterobacter cloacae complex NOT DETECTED NOT DETECTED Final   Escherichia coli NOT DETECTED NOT DETECTED Final   Klebsiella oxytoca NOT DETECTED NOT DETECTED Final   Klebsiella pneumoniae NOT DETECTED NOT DETECTED Final   Proteus species NOT DETECTED NOT DETECTED Final   Serratia marcescens NOT DETECTED NOT DETECTED Final   Haemophilus influenzae NOT DETECTED NOT DETECTED Final   Neisseria meningitidis NOT DETECTED NOT DETECTED Final   Pseudomonas aeruginosa NOT DETECTED NOT DETECTED Final   Candida albicans NOT DETECTED NOT DETECTED Final   Candida glabrata NOT DETECTED NOT DETECTED Final   Candida krusei NOT DETECTED NOT DETECTED Final   Candida parapsilosis NOT DETECTED NOT DETECTED Final   Candida tropicalis NOT DETECTED NOT DETECTED Final    Comment: Performed at [facility], [place] [address] [place], [place] [address]  Urine culture     Status: None   Collection Time: 12/25/17  3:34 PM  Result Value Ref Range Status   Specimen Description   Final    URINE, RANDOM Performed at [facility] [address],  [place], [place] [address]    Special Requests   Final    NONE Performed at [facility], [place] [address] [place], [place] [address]    Culture   Final    NO GROWTH Performed at [facility], [place] [address] [place], [place] [address]    Report Status 12/26/2017 FINAL  Final  MRSA PCR Screening     Status: None   Collection Time: 12/25/17  9:01 PM  Result Value Ref Range Status   MRSA by PCR NEGATIVE NEGATIVE Final    Comment:        The GeneXpert MRSA Assay (FDA approved for NASAL specimens only), is one component of a comprehensive MRSA colonization surveillance program. It is not intended to diagnose MRSA infection nor to guide or monitor treatment for MRSA infections. Performed at [facility], [place] [address] [place], [place] [address]   Aerobic/Anaerobic Culture (surgical/deep wound)     Status: None (Preliminary result)   Collection Time: 12/26/17  4:46 PM  Result Value Ref Range Status   Specimen Description   Final    ABSCESS PERIHEPATIC Performed at [facility] [address] [place], [place] [address]    Special Requests NONE SAMPLE B  Final   Gram Stain   Final    ABUNDANT WBC PRESENT, PREDOMINANTLY PMN RARE GRAM POSITIVE COCCI    Culture   Final    RARE ENTEROCOCCUS FAECALIS RARE GRAM NEGATIVE RODS MODERATE BACTEROIDES FRAGILIS BETA LACTAMASE POSITIVE Performed at [facility], [place] [address], [place],  [address]    Report Status PENDING  Incomplete   Organism ID, Bacteria ENTEROCOCCUS FAECALIS  Final      Susceptibility   Enterococcus faecalis - MIC*    AMPICILLIN <=2 SENSITIVE Sensitive     VANCOMYCIN 1 SENSITIVE Sensitive     GENTAMICIN SYNERGY SENSITIVE Sensitive     * RARE ENTEROCOCCUS FAECALIS  Aerobic/Anaerobic Culture (surgical/deep wound)     Status: None   Collection Time: 12/26/17  4:46 PM  Result Value Ref Range Status   Specimen Description GALL BLADDER  Final    Special Requests  SAMPLE A  Final   Gram Stain   Final    ABUNDANT WBC PRESENT, PREDOMINANTLY PMN FEW GRAM POSITIVE  COCCI FEW GRAM NEGATIVE RODS    Culture   Final    ABUNDANT ENTEROCOCCUS FAECALIS ABUNDANT ESCHERICHIA COLI Confirmed Extended Spectrum Beta-Lactamase Producer (ESBL).  In bloodstream infections from ESBL organisms, carbapenems are preferred over piperacillin/tazobactam. They are shown to have a lower risk of mortality. ABUNDANT BACTEROIDES FRAGILIS BETA LACTAMASE POSITIVE Performed at [facility], [place] [address], [place], [place] [address]    Report Status 12/31/2017 FINAL  Final   Organism ID, Bacteria ENTEROCOCCUS FAECALIS  Final   Organism ID, Bacteria ESCHERICHIA COLI  Final      Susceptibility   Escherichia coli - MIC*    AMPICILLIN >=32 RESISTANT Resistant     CEFAZOLIN >=64 RESISTANT Resistant     CEFEPIME RESISTANT Resistant     CEFTAZIDIME RESISTANT Resistant     CEFTRIAXONE RESISTANT Resistant     CIPROFLOXACIN 1 SENSITIVE Sensitive     GENTAMICIN <=1 SENSITIVE Sensitive     IMIPENEM <=0.25 SENSITIVE Sensitive     TRIMETH/SULFA >=320 RESISTANT Resistant     AMPICILLIN/SULBACTAM >=32 RESISTANT Resistant     PIP/TAZO <=4 SENSITIVE Sensitive     Extended ESBL POSITIVE Resistant     * ABUNDANT ESCHERICHIA COLI   Enterococcus faecalis - MIC*    AMPICILLIN <=2 SENSITIVE Sensitive     VANCOMYCIN 1 SENSITIVE Sensitive     GENTAMICIN SYNERGY SENSITIVE Sensitive     * ABUNDANT ENTEROCOCCUS FAECALIS  Culture, blood (routine x 2)     Status: None (Preliminary result)   Collection Time: 12/28/17  9:53 AM  Result Value Ref Range Status   Specimen Description   Final    BLOOD RIGHT ANTECUBITAL Performed at [facility] [address] [place], [place] [address]    Special Requests   Final    BOTTLES DRAWN AEROBIC ONLY Blood Culture adequate volume Performed at [facility] [address], [place], [place]  [address]    Culture   Final    NO GROWTH 3 DAYS Performed at [facility], [place] [address], [place], [place] [address]    Report Status PENDING  Incomplete  Culture, blood (routine x 2)     Status: None (Preliminary result)   Collection Time: 12/28/17 10:21 AM  Result Value Ref Range Status   Specimen Description   Final    BLOOD RIGHT HAND Performed at [facility] [address] [place], [place] [address]    Special Requests   Final    BOTTLES DRAWN AEROBIC ONLY Blood Culture adequate volume Performed at [facility] [address], [place], [place] [address]    Culture   Final    NO GROWTH 3 DAYS Performed at [facility], [place] [address], [place], [place] [address]    Report Status PENDING  Incomplete    Microbiology: Recent Results (from the past 240 hour(s))  Culture, blood (Routine x 2)     Status: None   Collection Time: 12/25/17 10:18 AM  Result Value Ref Range Status   Specimen Description   Final    BLOOD LEFT WRIST Performed at [facility] [address] [place], [place] [address]    Special Requests   Final    IN PEDIATRIC BOTTLE Blood Culture adequate volume Performed at [facility] [address] [place], [place] [address]    Culture   Final    NO GROWTH 5 DAYS Performed at [facility], [address] [place], [place] [address]    Report Status 12/30/2017 FINAL  Final  Culture, blood (Routine x 2)     Status: Abnormal   Collection Time: 12/25/17 10:26 AM  Result Value Ref Range Status   Specimen Description   Final    BLOOD RIGHT HAND Performed at [facility] [address] [place], [place] [address]    Special Requests   Final    IN PEDIATRIC BOTTLE Blood Culture adequate volume Performed at [facility] [address] [place], [place] [address]    Culture  Setup Time   Final    GRAM POSITIVE COCCI IN  PEDIATRIC BOTTLE CRITICAL RESULT CALLED TO, READ BACK BY AND VERIFIED WITH: J GRIMSLEY [facility] 12/26/17 0640 JDW    Culture (A)  Final    STAPHYLOCOCCUS SPECIES (COAGULASE NEGATIVE) THE SIGNIFICANCE OF ISOLATING THIS ORGANISM FROM A SINGLE SET OF BLOOD CULTURES WHEN MULTIPLE SETS ARE DRAWN IS UNCERTAIN. PLEASE NOTIFY THE MICROBIOLOGY DEPARTMENT WITHIN ONE WEEK IF SPECIATION AND SENSITIVITIES ARE REQUIRED. Performed at [facility], [place] [address] [place], [place] [address]    Report Status 12/27/2017 FINAL  Final  Blood Culture ID Panel (Reflexed)     Status: Abnormal   Collection Time: 12/25/17 10:26 AM  Result Value Ref Range Status   Enterococcus species NOT DETECTED NOT DETECTED Final   Listeria monocytogenes NOT DETECTED NOT DETECTED Final   Staphylococcus species DETECTED (A) NOT DETECTED Final    Comment: Methicillin (oxacillin) resistant coagulase negative staphylococcus. Possible blood culture contaminant (unless isolated from more than one blood culture draw or clinical case suggests pathogenicity). No antibiotic treatment is indicated for blood  culture contaminants. CRITICAL RESULT CALLED TO, READ BACK BY AND VERIFIED WITH: J GRIMSLEY PHARMD 12/26/17 0640 JDW    Staphylococcus aureus NOT DETECTED NOT DETECTED Final   Methicillin resistance DETECTED (A) NOT DETECTED Final    Comment: CRITICAL RESULT CALLED TO, READ BACK BY AND VERIFIED WITH: J GRIMSLEY PHARMD 12/26/17 0640 JDW    Streptococcus species NOT DETECTED NOT DETECTED Final   Streptococcus agalactiae NOT DETECTED NOT DETECTED Final   Streptococcus pneumoniae NOT DETECTED NOT DETECTED Final   Streptococcus pyogenes NOT DETECTED NOT DETECTED Final   Acinetobacter baumannii NOT DETECTED NOT DETECTED Final   Enterobacteriaceae species NOT DETECTED NOT DETECTED Final   Enterobacter cloacae complex NOT DETECTED NOT DETECTED Final   Escherichia coli NOT DETECTED NOT DETECTED Final   Klebsiella oxytoca NOT DETECTED NOT  DETECTED Final   Klebsiella pneumoniae NOT DETECTED NOT DETECTED Final   Proteus species NOT DETECTED NOT DETECTED Final   Serratia marcescens NOT DETECTED NOT DETECTED Final   Haemophilus influenzae NOT DETECTED NOT DETECTED Final   Neisseria meningitidis NOT DETECTED NOT DETECTED Final   Pseudomonas aeruginosa NOT DETECTED NOT DETECTED Final   Candida albicans NOT DETECTED NOT DETECTED Final   Candida glabrata NOT DETECTED NOT DETECTED Final   Candida krusei NOT DETECTED NOT DETECTED Final   Candida parapsilosis NOT DETECTED NOT DETECTED Final   Candida tropicalis NOT DETECTED NOT DETECTED Final    Comment: Performed at [facility], [place] [address] [place], [place] [address]  Urine culture     Status: None   Collection Time: 12/25/17  3:34 PM  Result Value Ref Range Status   Specimen Description   Final    URINE, RANDOM Performed at [facility] [address] [place], [place] [address]    Special  Requests   Final    NONE Performed at [facility], [place] [address], [place], [place] [address]    Culture   Final    NO GROWTH Performed at [facility], [place] [address] [place], [place] [address]    Report Status 12/26/2017 FINAL  Final  MRSA PCR Screening     Status: None   Collection Time: 12/25/17  9:01 PM  Result Value Ref Range Status   MRSA by PCR NEGATIVE NEGATIVE Final    Comment:        The GeneXpert MRSA Assay (FDA approved for NASAL specimens only), is one component of a comprehensive MRSA colonization surveillance program. It is not intended to diagnose MRSA infection nor to guide or monitor treatment for MRSA infections. Performed at [facility], [place] [address], [place], [place] [address]   Aerobic/Anaerobic Culture (surgical/deep wound)     Status: None (Preliminary result)   Collection Time: 12/26/17  4:46 PM  Result Value Ref Range Status   Specimen Description   Final    ABSCESS  PERIHEPATIC Performed at [facility] [address] [place], [place] [address]    Special Requests NONE SAMPLE B  Final   Gram Stain   Final    ABUNDANT WBC PRESENT, PREDOMINANTLY PMN RARE GRAM POSITIVE COCCI    Culture   Final    RARE ENTEROCOCCUS FAECALIS RARE GRAM NEGATIVE RODS MODERATE BACTEROIDES FRAGILIS BETA LACTAMASE POSITIVE Performed at [facility], [place] [address] [place], [place] [address]    Report Status PENDING  Incomplete   Organism ID, Bacteria ENTEROCOCCUS FAECALIS  Final      Susceptibility   Enterococcus faecalis - MIC*    AMPICILLIN <=2 SENSITIVE Sensitive     VANCOMYCIN 1 SENSITIVE Sensitive     GENTAMICIN SYNERGY SENSITIVE Sensitive     * RARE ENTEROCOCCUS FAECALIS  Aerobic/Anaerobic Culture (surgical/deep wound)     Status: None   Collection Time: 12/26/17  4:46 PM  Result Value Ref Range Status   Specimen Description GALL BLADDER  Final   Special Requests  SAMPLE A  Final   Gram Stain   Final    ABUNDANT WBC PRESENT, PREDOMINANTLY PMN FEW GRAM POSITIVE COCCI FEW GRAM NEGATIVE RODS    Culture   Final    ABUNDANT ENTEROCOCCUS FAECALIS ABUNDANT ESCHERICHIA COLI Confirmed Extended Spectrum Beta-Lactamase Producer (ESBL).  In bloodstream infections from ESBL organisms, carbapenems are preferred over piperacillin/tazobactam. They are shown to have a lower risk of mortality. ABUNDANT BACTEROIDES FRAGILIS BETA LACTAMASE POSITIVE Performed at [facility], [place] [address], [place], [place] [address]    Report Status 12/31/2017 FINAL  Final   Organism ID, Bacteria ENTEROCOCCUS FAECALIS  Final   Organism ID, Bacteria ESCHERICHIA COLI  Final      Susceptibility   Escherichia coli - MIC*    AMPICILLIN >=32 RESISTANT Resistant     CEFAZOLIN >=64 RESISTANT Resistant     CEFEPIME RESISTANT Resistant     CEFTAZIDIME RESISTANT Resistant     CEFTRIAXONE RESISTANT Resistant     CIPROFLOXACIN 1 SENSITIVE Sensitive      GENTAMICIN <=1 SENSITIVE Sensitive     IMIPENEM <=0.25 SENSITIVE Sensitive     TRIMETH/SULFA >=320 RESISTANT Resistant     AMPICILLIN/SULBACTAM >=32 RESISTANT Resistant     PIP/TAZO <=4 SENSITIVE Sensitive     Extended ESBL POSITIVE Resistant     * ABUNDANT ESCHERICHIA COLI   Enterococcus faecalis -  MIC*    AMPICILLIN <=2 SENSITIVE Sensitive     VANCOMYCIN 1 SENSITIVE Sensitive     GENTAMICIN SYNERGY SENSITIVE Sensitive     * ABUNDANT ENTEROCOCCUS FAECALIS  Culture, blood (routine x 2)     Status: None (Preliminary result)   Collection Time: 12/28/17  9:53 AM  Result Value Ref Range Status   Specimen Description   Final    BLOOD RIGHT ANTECUBITAL Performed at [facility] [address] [place], [place] [address]    Special Requests   Final    BOTTLES DRAWN AEROBIC ONLY Blood Culture adequate volume Performed at [facility] [address] [place], [place] [address]    Culture   Final    NO GROWTH 3 DAYS Performed at [facility], [place] [address], [place], [place] [address]    Report Status PENDING  Incomplete  Culture, blood (routine x 2)     Status: None (Preliminary result)   Collection Time: 12/28/17 10:21 AM  Result Value Ref Range Status   Specimen Description   Final    BLOOD RIGHT HAND Performed at [facility] [address], [place], [place] [address]    Special Requests   Final    BOTTLES DRAWN AEROBIC ONLY Blood Culture adequate volume Performed at [facility] [address], [place], [place] [address]    Culture   Final    NO GROWTH 3 DAYS Performed at [facility], [place] [address] [place], [place] [address]    Report Status PENDING  Incomplete    Radiographs and labs were personally reviewed by me.   Bobby Rumpf, MD [facility] [phone] 12/31/2017, 3:22 PM

## 2018-01-01 ENCOUNTER — Inpatient Hospital Stay (HOSPITAL_COMMUNITY): Payer: Medicare Other

## 2018-01-01 ENCOUNTER — Encounter (HOSPITAL_COMMUNITY): Payer: Self-pay

## 2018-01-01 DIAGNOSIS — L89609 Pressure ulcer of unspecified heel, unspecified stage: Secondary | ICD-10-CM

## 2018-01-01 DIAGNOSIS — L89619 Pressure ulcer of right heel, unspecified stage: Secondary | ICD-10-CM

## 2018-01-01 DIAGNOSIS — L89629 Pressure ulcer of left heel, unspecified stage: Secondary | ICD-10-CM

## 2018-01-01 DIAGNOSIS — B952 Enterococcus as the cause of diseases classified elsewhere: Secondary | ICD-10-CM

## 2018-01-01 DIAGNOSIS — A498 Other bacterial infections of unspecified site: Secondary | ICD-10-CM

## 2018-01-01 DIAGNOSIS — K810 Acute cholecystitis: Secondary | ICD-10-CM

## 2018-01-01 DIAGNOSIS — B379 Candidiasis, unspecified: Secondary | ICD-10-CM

## 2018-01-01 LAB — BASIC METABOLIC PANEL
Anion gap: 11 (ref 5–15)
BUN: 30 mg/dL — AB (ref 6–20)
CO2: 24 mmol/L (ref 22–32)
CREATININE: 1.1 mg/dL (ref 0.61–1.24)
Calcium: 8 mg/dL — ABNORMAL LOW (ref 8.9–10.3)
Chloride: 100 mmol/L — ABNORMAL LOW (ref 101–111)
GFR calc Af Amer: >60 mL/min (ref >60)
Glucose, Bld: 172 mg/dL — ABNORMAL HIGH (ref 65–99)
Potassium: 3.9 mmol/L (ref 3.5–5.1)
SODIUM: 135 mmol/L (ref 135–145)

## 2018-01-01 LAB — CBC WITH DIFFERENTIAL/PLATELET
BASOS ABS: 0 10*3/uL (ref 0.0–0.1)
Basophils Relative: 0 %
EOS ABS: 0.1 10*3/uL (ref 0.0–0.7)
EOS PCT: 1 %
HCT: 33.9 % — ABNORMAL LOW (ref 39.0–52.0)
Hemoglobin: 10.7 g/dL — ABNORMAL LOW (ref 13.0–17.0)
LYMPHS ABS: 2.2 10*3/uL (ref 0.7–4.0)
LYMPHS PCT: 14 %
MCH: 27.9 pg (ref 26.0–34.0)
MCHC: 31.6 g/dL (ref 30.0–36.0)
MCV: 88.5 fL (ref 78.0–100.0)
MONO ABS: 0.9 10*3/uL (ref 0.1–1.0)
Monocytes Relative: 6 %
Neutro Abs: 12 10*3/uL — ABNORMAL HIGH (ref 1.7–7.7)
Neutrophils Relative %: 79 %
PLATELETS: 432 10*3/uL — AB (ref 150–400)
RBC: 3.83 MIL/uL — AB (ref 4.22–5.81)
RDW: 15.3 % (ref 11.5–15.5)
WBC: 15.3 10*3/uL — AB (ref 4.0–10.5)

## 2018-01-01 LAB — BRAIN NATRIURETIC PEPTIDE: B NATRIURETIC PEPTIDE 5: 230.3 pg/mL — AB (ref 0.0–100.0)

## 2018-01-01 LAB — GLUCOSE, CAPILLARY
GLUCOSE-CAPILLARY: 160 mg/dL — AB (ref 65–99)
GLUCOSE-CAPILLARY: 226 mg/dL — AB (ref 65–99)
Glucose-Capillary: 261 mg/dL — ABNORMAL HIGH (ref 65–99)
Glucose-Capillary: 224 mg/dL — ABNORMAL HIGH (ref 65–99)

## 2018-01-01 MED ORDER — FLUCONAZOLE IN SODIUM CHLORIDE 200-0.9 MG/100ML-% IV SOLN
200.0000 mg | INTRAVENOUS | Status: DC
Start: 2018-01-01 — End: 2018-01-04
  Administered 2018-01-01 – 2018-01-04 (×4): 200 mg via INTRAVENOUS
  Filled 2018-01-01 (×4): qty 100

## 2018-01-01 MED ORDER — IOPAMIDOL (ISOVUE-300) INJECTION 61%: 15.0000 mL | Freq: Once | INTRAVENOUS | Status: AC | PRN

## 2018-01-01 MED ORDER — IOPAMIDOL (ISOVUE-300) INJECTION 61%
INTRAVENOUS | Status: AC
  Filled 2018-01-01: qty 30

## 2018-01-01 MED ORDER — FUROSEMIDE 10 MG/ML IJ SOLN
40.0000 mg | Freq: Once | INTRAMUSCULAR | Status: AC
  Administered 2018-01-01: 40 mg via INTRAVENOUS
  Filled 2018-01-01: qty 4

## 2018-01-01 MED ORDER — IOPAMIDOL (ISOVUE-300) INJECTION 61%
INTRAVENOUS | Status: AC
  Administered 2018-01-01: 100 mL via INTRAVENOUS
  Filled 2018-01-01: qty 100

## 2018-01-01 MED ORDER — ALTEPLASE 2 MG IJ SOLR
2.0000 mg | Freq: Once | INTRAMUSCULAR | Status: AC
  Administered 2018-01-01: 2 mg
  Filled 2018-01-01: qty 2

## 2018-01-01 NOTE — Progress Notes (Signed)
CSW following to assist with disposition. Pt is long term care resident at [facility]) SNF. Anticipate return at DC (current plan he will need PICC for IV antibiotics post DC). Updated facility today on pt's status.  Sharren Bridge, MSW, LCSW Clinical Social Work 01/01/2018 [phone]

## 2018-01-01 NOTE — Progress Notes (Signed)
  Speech Language Pathology Treatment: Dysphagia  Patient Details Name: Preston Moore MRN: [id number] DOB: 02/04/1934 Today's Date: 01/01/2018 Time: 1211-1229 SLP Time Calculation (min) (ACUTE ONLY): 18 min  Assessment / Plan / Recommendation Clinical Impression  Intake is poor - Daughter reports pt with poor intake since in hospital.  Today pt remains sleepy with very weak phonation ability *which daughter states is not baseline.  Pt consumed thin water via straw feeding himself with good tolerance and no indication of aspiration. RN reports pt is tolerating medication administration.  Nurse tech removed breakfast tray from room and pt had only consumed a bite or two of his oatmeal but consumed all of his liquids.  Note palliative is on board seeing pt.  Open mouth breathing noted while sleeping.  MD arrived to see pt during SLP visit therefore visit was limited.  For now, due to mentation, oral candidiasis and oral deficits noted 3/18, recommend continue full liquids with strict precautions.  Will follow up next date- Daughter educated and agreeable to plan.  Given progressive nature of functional decline per friend Preston Moore, thankful palliative is following.   HPI HPI: 84 yo male adm to [facility] hospital - required cholecystotomy drain - PMH + for closed hip fx and has been bed bound since per chart.  Pt found to have pna and has problems swallowing. MD ordered swallow evaluation.  Daughter reports pt fed himself prior to admission but since admitted, he is weak and does not want to eat/drink much. She states he will take a bite or few sips and then he does not want anymore intake.  Family friend Preston Moore reported pt has not been as interactive over the last month as normal.  Pt noted to have oral candidiasis yesterday and diet modified to full liquids yesterday due to oral holding.        SLP Plan  Continue with current plan of care       Recommendations  Diet recommendations: Thin  liquid;Other(comment)(full liquids, will follow up next date for readiness for dietary advancement, daughter informed and agreeable to plan) Liquids provided via: Straw Medication Administration: (as tolerated) Supervision: Staff to assist with self feeding Compensations: Slow rate;Small sips/bites;Other (Comment)(oral suction before and after intake) Postural Changes and/or Swallow Maneuvers: Seated upright 90 degrees;Upright 30-60 min after meal                Oral Care Recommendations: Oral care before and after PO SLP Visit Diagnosis: Dysphagia, oropharyngeal phase (R13.12)(possible pharyngeal) Plan: Continue with current plan of care       GO                Macario Golds 01/01/2018, 2:20 PM  Preston Moore, [facility] SLP [phone]

## 2018-01-01 NOTE — Progress Notes (Signed)
INFECTIOUS DISEASE PROGRESS NOTE  ID: Preston Moore is a 84 y.o. male with  Active Problems:   Septic shock ([place])   Protein-calorie malnutrition, severe   Pressure injury of skin  Subjective: Alert, interactive.  Denies dysphagia, mouth is "dry"  Abtx:  Anti-infectives (From admission, onward)   Start     Dose/Rate Route Frequency Ordered Stop   12/30/17 1230  imipenem-cilastatin (PRIMAXIN) 500 mg in sodium chloride 0.9 % 100 mL IVPB     500 mg 200 mL/hr over 30 Minutes Intravenous Every 6 hours 12/30/17 1132     12/28/17 1400  piperacillin-tazobactam (ZOSYN) IVPB 3.375 g  Status:  Discontinued     3.375 g 12.5 mL/hr over 240 Minutes Intravenous Every 8 hours 12/28/17 0851 12/30/17 1127   12/28/17 1000  ciprofloxacin (CIPRO) IVPB 200 mg  Status:  Discontinued     200 mg 100 mL/hr over 60 Minutes Intravenous Every 12 hours 12/28/17 0833 12/28/17 0848   12/27/17 2200  meropenem (MERREM) 1 g in sodium chloride 0.9 % 100 mL IVPB  Status:  Discontinued     1 g 200 mL/hr over 30 Minutes Intravenous Every 8 hours 12/27/17 1208 12/28/17 0833   12/25/17 2200  linezolid (ZYVOX) IVPB 600 mg  Status:  Discontinued     600 mg 300 mL/hr over 60 Minutes Intravenous Every 12 hours 12/25/17 1133 12/28/17 0833   12/25/17 2200  meropenem (MERREM) 1 g in sodium chloride 0.9 % 100 mL IVPB  Status:  Discontinued     1 g 200 mL/hr over 30 Minutes Intravenous Every 12 hours 12/25/17 1133 12/27/17 1208   12/25/17 1115  linezolid (ZYVOX) IVPB 600 mg     600 mg 300 mL/hr over 60 Minutes Intravenous STAT 12/25/17 1108 12/25/17 1346   12/25/17 1115  meropenem (MERREM) 1 g in sodium chloride 0.9 % 100 mL IVPB     1 g 200 mL/hr over 30 Minutes Intravenous STAT 12/25/17 1108 12/25/17 1244      Medications:  Scheduled: . feeding supplement (GLUCERNA SHAKE)  237 mL Oral TID BM  . Gerhardt's butt cream   Topical QID  . insulin aspart  0-20 Units Subcutaneous TID AC & HS  . insulin glargine  15 Units  Subcutaneous QHS  . lactobacillus  1 g Oral Q breakfast  . metoprolol tartrate  25 mg Oral BID  . nutrition supplement (JUVEN)  1 packet Oral BID WC  . OLANZapine  5 mg Oral QHS  . sodium chloride flush  5 mL Intracatheter Q12H    Objective: Vital signs in last 24 hours: Temp:  [97.9 F (36.6 C)-99.5 F (37.5 C)] 97.9 F (36.6 C) (03/19 0536) Pulse Rate:  [66-100] 94 (03/19 0536) Resp:  [18-21] 18 (03/19 0536) BP: (92-140)/(51-80) 120/69 (03/19 0536) SpO2:  [91 %-96 %] 92 % (03/19 0536) Weight:  [98 kg (216 lb 0.8 oz)] 98 kg (216 lb 0.8 oz) (03/19 0500)   General appearance: alert, cooperative and no distress Resp: clear to auscultation bilaterally Cardio: regular rate and rhythm GI: normal findings: bowel sounds normal and soft, non-tender Extremities: LE/heels wrapped.  Incision/Wound: drains- bilious drainage, pustular drainage in respective tubes.   Lab Results Recent Labs    12/30/17 0640 01/01/18 0352  WBC 11.6* 15.3*  HGB 11.2* 10.7*  HCT 35.1* 33.9*  NA 135 135  K 4.1 3.9  CL 101 100*  CO2 24 24  BUN 20 30*  CREATININE 1.23 1.10   Liver Panel No  results for input(s): PROT, ALBUMIN, AST, ALT, ALKPHOS, BILITOT, BILIDIR, IBILI in the last 72 hours. Sedimentation Rate No results for input(s): ESRSEDRATE in the last 72 hours. C-Reactive Protein No results for input(s): CRP in the last 72 hours.  Microbiology: Recent Results (from the past 240 hour(s))  Culture, blood (Routine x 2)     Status: None   Collection Time: 12/25/17 10:18 AM  Result Value Ref Range Status   Specimen Description   Final    BLOOD LEFT WRIST Performed at [facility] [address], [place], [place] [address]    Special Requests   Final    IN PEDIATRIC BOTTLE Blood Culture adequate volume Performed at [facility] [address], [place], [place] [address]    Culture   Final    NO GROWTH 5 DAYS Performed at [facility], [place] [address], [place], [place] [address]    Report Status 12/30/2017 FINAL  Final  Culture, blood (Routine x 2)     Status: Abnormal   Collection Time: 12/25/17 10:26 AM  Result Value Ref Range Status   Specimen Description   Final    BLOOD RIGHT HAND Performed at [facility] [address], [place], [place] [address]    Special Requests   Final    IN PEDIATRIC BOTTLE Blood Culture adequate volume Performed at [facility] [address], [place], [place] [address]    Culture  Setup Time   Final    GRAM POSITIVE COCCI IN PEDIATRIC BOTTLE CRITICAL RESULT CALLED TO, READ BACK BY AND VERIFIED WITH: J GRIMSLEY [facility] 12/26/17 0640 JDW    Culture (A)  Final    STAPHYLOCOCCUS SPECIES (COAGULASE NEGATIVE) THE SIGNIFICANCE OF ISOLATING THIS ORGANISM FROM A SINGLE SET OF BLOOD CULTURES WHEN MULTIPLE SETS ARE DRAWN IS UNCERTAIN. PLEASE NOTIFY THE MICROBIOLOGY DEPARTMENT WITHIN ONE WEEK IF SPECIATION AND SENSITIVITIES ARE REQUIRED. Performed at [facility], [place] [address] [place], [place] [address]    Report Status 12/27/2017 FINAL  Final  Blood Culture ID Panel (Reflexed)     Status: Abnormal   Collection Time: 12/25/17 10:26 AM  Result Value Ref Range Status   Enterococcus species NOT DETECTED NOT DETECTED Final   Listeria monocytogenes NOT DETECTED NOT DETECTED Final   Staphylococcus species DETECTED (A) NOT DETECTED Final    Comment: Methicillin (oxacillin) resistant coagulase negative staphylococcus. Possible blood culture contaminant (unless isolated from more than one blood culture draw or clinical case suggests pathogenicity). No antibiotic treatment is indicated for blood  culture contaminants. CRITICAL RESULT CALLED TO, READ BACK BY AND VERIFIED WITH: J GRIMSLEY PHARMD 12/26/17 0640 JDW    Staphylococcus aureus NOT DETECTED NOT DETECTED Final   Methicillin resistance DETECTED (A) NOT DETECTED Final    Comment: CRITICAL RESULT CALLED TO, READ  BACK BY AND VERIFIED WITH: J GRIMSLEY PHARMD 12/26/17 0640 JDW    Streptococcus species NOT DETECTED NOT DETECTED Final   Streptococcus agalactiae NOT DETECTED NOT DETECTED Final   Streptococcus pneumoniae NOT DETECTED NOT DETECTED Final   Streptococcus pyogenes NOT DETECTED NOT DETECTED Final   Acinetobacter baumannii NOT DETECTED NOT DETECTED Final   Enterobacteriaceae species NOT DETECTED NOT DETECTED Final   Enterobacter cloacae complex NOT DETECTED NOT DETECTED Final   Escherichia coli NOT DETECTED NOT DETECTED Final   Klebsiella oxytoca NOT DETECTED NOT DETECTED Final   Klebsiella pneumoniae NOT DETECTED NOT DETECTED Final   Proteus species NOT DETECTED NOT  DETECTED Final   Serratia marcescens NOT DETECTED NOT DETECTED Final   Haemophilus influenzae NOT DETECTED NOT DETECTED Final   Neisseria meningitidis NOT DETECTED NOT DETECTED Final   Pseudomonas aeruginosa NOT DETECTED NOT DETECTED Final   Candida albicans NOT DETECTED NOT DETECTED Final   Candida glabrata NOT DETECTED NOT DETECTED Final   Candida krusei NOT DETECTED NOT DETECTED Final   Candida parapsilosis NOT DETECTED NOT DETECTED Final   Candida tropicalis NOT DETECTED NOT DETECTED Final    Comment: Performed at [facility], [place] [address], [place], [place] [address]  Urine culture     Status: None   Collection Time: 12/25/17  3:34 PM  Result Value Ref Range Status   Specimen Description   Final    URINE, RANDOM Performed at [facility] [address], [place], [place] [address]    Special Requests   Final    NONE Performed at [facility], [place] [address] [place], [place] [address]    Culture   Final    NO GROWTH Performed at [facility], [place] [address], [place], [place] [address]    Report Status 12/26/2017 FINAL  Final  MRSA PCR Screening     Status: None   Collection Time: 12/25/17  9:01 PM  Result Value Ref Range Status   MRSA by PCR NEGATIVE NEGATIVE  Final    Comment:        The GeneXpert MRSA Assay (FDA approved for NASAL specimens only), is one component of a comprehensive MRSA colonization surveillance program. It is not intended to diagnose MRSA infection nor to guide or monitor treatment for MRSA infections. Performed at [facility], [place] [address], [place], [place] [address]   Aerobic/Anaerobic Culture (surgical/deep wound)     Status: None (Preliminary result)   Collection Time: 12/26/17  4:46 PM  Result Value Ref Range Status   Specimen Description   Final    ABSCESS PERIHEPATIC Performed at [facility] [address] [place], [place] [address]    Special Requests NONE SAMPLE B  Final   Gram Stain   Final    ABUNDANT WBC PRESENT, PREDOMINANTLY PMN RARE GRAM POSITIVE COCCI    Culture   Final    RARE ENTEROCOCCUS FAECALIS RARE GRAM NEGATIVE RODS MODERATE BACTEROIDES FRAGILIS BETA LACTAMASE POSITIVE Performed at [facility], [place] [address], [place], [place] [address]    Report Status PENDING  Incomplete   Organism ID, Bacteria ENTEROCOCCUS FAECALIS  Final      Susceptibility   Enterococcus faecalis - MIC*    AMPICILLIN <=2 SENSITIVE Sensitive     VANCOMYCIN 1 SENSITIVE Sensitive     GENTAMICIN SYNERGY SENSITIVE Sensitive     * RARE ENTEROCOCCUS FAECALIS  Aerobic/Anaerobic Culture (surgical/deep wound)     Status: None   Collection Time: 12/26/17  4:46 PM  Result Value Ref Range Status   Specimen Description GALL BLADDER  Final   Special Requests  SAMPLE A  Final   Gram Stain   Final    ABUNDANT WBC PRESENT, PREDOMINANTLY PMN FEW GRAM POSITIVE COCCI FEW GRAM NEGATIVE RODS    Culture   Final    ABUNDANT ENTEROCOCCUS FAECALIS ABUNDANT ESCHERICHIA COLI Confirmed Extended Spectrum Beta-Lactamase Producer (ESBL).  In bloodstream infections from ESBL organisms, carbapenems are preferred over piperacillin/tazobactam. They are shown to have a lower risk of  mortality. ABUNDANT BACTEROIDES FRAGILIS BETA LACTAMASE POSITIVE Performed at [facility], [address] [place] [address], [place], [place] [address]    Report Status 12/31/2017 FINAL  Final   Organism ID, Bacteria ENTEROCOCCUS FAECALIS  Final   Organism ID, Bacteria ESCHERICHIA COLI  Final      Susceptibility   Escherichia coli - MIC*    AMPICILLIN >=32 RESISTANT Resistant     CEFAZOLIN >=64 RESISTANT Resistant     CEFEPIME RESISTANT Resistant     CEFTAZIDIME RESISTANT Resistant     CEFTRIAXONE RESISTANT Resistant     CIPROFLOXACIN 1 SENSITIVE Sensitive     GENTAMICIN <=1 SENSITIVE Sensitive     IMIPENEM <=0.25 SENSITIVE Sensitive     TRIMETH/SULFA >=320 RESISTANT Resistant     AMPICILLIN/SULBACTAM >=32 RESISTANT Resistant     PIP/TAZO <=4 SENSITIVE Sensitive     Extended ESBL POSITIVE Resistant     * ABUNDANT ESCHERICHIA COLI   Enterococcus faecalis - MIC*    AMPICILLIN <=2 SENSITIVE Sensitive     VANCOMYCIN 1 SENSITIVE Sensitive     GENTAMICIN SYNERGY SENSITIVE Sensitive     * ABUNDANT ENTEROCOCCUS FAECALIS  Culture, blood (routine x 2)     Status: None (Preliminary result)   Collection Time: 12/28/17  9:53 AM  Result Value Ref Range Status   Specimen Description   Final    BLOOD RIGHT ANTECUBITAL Performed at [facility] [address], [place], [place] [address]    Special Requests   Final    BOTTLES DRAWN AEROBIC ONLY Blood Culture adequate volume Performed at [facility] [address] [place], [place] [address]    Culture   Final    NO GROWTH 4 DAYS Performed at [facility], [place] [address] [place], [place] [address]    Report Status PENDING  Incomplete  Culture, blood (routine x 2)     Status: None (Preliminary result)   Collection Time: 12/28/17 10:21 AM  Result Value Ref Range Status   Specimen Description   Final    BLOOD RIGHT HAND Performed at [facility] [address], [place], [place]  [address]    Special Requests   Final    BOTTLES DRAWN AEROBIC ONLY Blood Culture adequate volume Performed at [facility] [address], [place], [place] [address]    Culture   Final    NO GROWTH 4 DAYS Performed at [facility], [place] [address] [place], [place] [address]    Report Status PENDING  Incomplete    Studies/Results: [place] Ekg Site Rite  Result Date: 12/31/2017 If Site Rite image not attached, placement could not be confirmed due to current cardiac rhythm.    Assessment/Plan: Acalculous cholecystitis Liver abscess ESBL E coli E faecalis Decubitus ulcers, heels Candidiasis Oropharyngeal dysphagia DM2 Protein Calorie malnutrition (severe)  Total days of antibiotics: 7 (imimpenem 2, flucon 1)  No change in anbx WBC up, not sure of clinical significance. No further fever. Await repeat imaging.  Continue wound care.  Air mattress placed Appreciate nutrition f/u.  Discussed with family.          Bobby Rumpf MD, FACP Infectious Diseases (pager) [phone] [email or web address] 01/01/2018, 11:09 AM  LOS: 7 days

## 2018-01-01 NOTE — Progress Notes (Addendum)
Attending MD note  Patient was seen, examined,treatment plan was discussed with the PA-S.  I have personally reviewed the clinical findings, lab, imaging studies and management of this patient in detail. I agree with the documentation, as recorded by the PA-S  Patient is 84 year old male with significant medical history of systolic heart failure with EF of 35%, bedbound, type 2 diabetes mellitus, A. fib and hypertension presented to the emergency department with tachycardia.  He is a nursing home resident was noted to have significant tachycardia, fever and hypotension and was sent to the ED for further evaluation.  On initial exam patient was hemodynamically stable.  Abdominal CT with several low-attenuation foci within the medial segment of the left lobe of the, continues to the gallbladder suspicion for small liver abscess liver subdiaphragmatic fluid collection was identified.  Chest x-ray showed hyperinflated lungs.  An EKG with A. fib with RVR.  She was admitted with working diagnosis of sepsis due to liver abscess, acalculous cholecystitis with complicated acute renal failure and A. fib with RVR.  Patient underwent percutaneous cholecystectomy and perihepatic fluid drainage on 12/26/17 by IR.  Fluid collection reveal ESBL, ID was consulted and patient was started on Merrem.  S: Patient seen and examined, report feeling slight better today.  Mentation appears to improve.  Responding to questions appropriately.  Patient reported feeling short of breath, with mild chest discomfort.  On Exam: Blood pressure (!) 107/55, pulse 93, temperature 99.6 F (37.6 C), temperature source Oral, resp. rate 18, height 5\' 11"  (1.803 m), weight 98 kg (216 lb 0.8 oz), SpO2 95 %.  Gen. exam: Awake, alert, not in any distress Chest: Decreased air entry bilaterally, mild basilar crackle at the right.  No wheezing CVS: S1-S2 regular, no murmurs Abdomen: Soft, nontender and nondistended, JP drain empty, cholecystectomy  draining with green bile Neurology: Slow response Skin: No rash   Impression/plan: Sepsis due to acalculous cholecystitis and liver abscess/enterococcus and ESBL E. coli Sepsis physiology has resolved ID has been consulted and recommended IV imipenem.  PICC line has been placed for long-term antibiotics.  WBC slight elevated, patient continues to be afebrile not concerned at this point. Repeat CT abdomen today as JP drain with no drainage  A. fib with RVR Heart rate now well controlled, patient on metoprolol 25 mg twice daily Not on anticoagulation  Systolic heart failure with EF of 30-35% Now with increasing shortness of breath, increasing oxygen requirement and crackles on exam.  Could be fluid overload due to IV fluids during hospital stay.  Will give 1 dose of Lasix IV, obtain chest x-ray and EKG.  Check BNP.  Continue to monitor closely, oxygen supplementation as needed.  AKI Creatinine improved back to baseline was treated with IV fluid, now on hold.  Continue to monitor for now with hypotension and nephrotoxic agent.  Oral candidiasis In setting of patient with type 2 diabetes mellitus IV fluconazole 200 mg for 7 days  Type 2 diabetes mellitus CBGs labile Will increase Lantus to 25 units Continue SSI  Hypertension BP stable off antihypertensive Will continue to monitor  Disposition: Pending CT abdomen, SNF in 2-3 days.  Rest as below  Preston Oman, MD        Progress Note   Preston Moore:[id number] DOB: 1934/08/06 DOA: 12/25/2017 PCP: Preston Cranker, DO   LOS: 7 days    Brief Narrative:   Preston Moore is an 84 year old male with a medical history significant of systolic heart failure with EF of 35%,  bilateral hip replacement 2 years ago, bed-bound and history of bilateral heel ulcers, type 2 diabetes mellitus, atrial fibrillation, and hypertension. Patient was having ongoing treatment for pneumonia at his nursing facility where he resided. He was noted  to have significant tachycardia, fever, and hypotension. EKG in ED positive for atrial fibrillation. Abdominal CT showed liver abscesses and acalculus cholecystitis. Status post IR percutaneous cholecystostomy tube placement and perihepatic abscess drainage. Abscess content sent for culture which grew positive for ESBL.   Patient was admitted with a working diagnosis of sepsis due to liver abscess, acalculous cholecystitis, complicated by ADKI in A.fib with RVR.  Assessment/Plan:   Active Problems:   Septic shock (HCC)   Protein-calorie malnutrition, severe   Pressure injury of skin  Resolving sepsis due to acalculous cholecystitis and liver abscess- enterococcus and ESBL E coli -Status post IR percutaneous cholecystostomy tube placement and perihepatic abscess drainage -Abscess cultured and positive for ESBL -Continue management with IV imipenem as per Dr. Algis Moore recommendation -PICC line placed for future discharge plans to SNF  -Patient continues to be afebrile -Order repeat CT abdomen with contrast to see if reduced drain output is a result of no more fluid to drain or if drain placement has shifted  Oral candidiasis  -Treat with IV fluconazole  Atrial fibrillation and flutter -Continue to monitor and manage with metoprolol 25 mg BID  Systolic heart failure, EF 30-35% -Continue metoprolol -Holding lisinopril   Hypertension -Continue metoprolol and hold lisinopril due to AKI, BP remains stable.  Type 2 diabetes mellitus -Continue to monitor capillary glucose and manage with SSI.  -SLP completed swallow evaluation and recommends continuation of full liquid diet.  Protein-calorie malnutrition, severe -Continue management with Juven and Glucerna shake.  Family Communication/Anticipated D/C date and plan/Code Status   DVT prophylaxis: SCDs Code Status: DNR Family Communication: Spoke with patient's daughter and all questions answered. Disposition Plan: SNF with PICC  line placed once stable   Medical Consultants:       Procedures:    None   Antimicrobials:    IV imipenem    Subjective:   Patient feels a bit better today. Appears to have improved mentation, is working at communication -- responding to questions, talking with his daughter, and is able to point to where he has chest pain.   Objective:   Vitals:   12/31/17 2138 01/01/18 0500 01/01/18 0536 01/01/18 1244  BP: 140/80  120/69 (!) 107/55  Pulse: 100  94 93  Resp:   18   Temp: 97.9 F (36.6 C)  97.9 F (36.6 C) 99.6 F (37.6 C)  TempSrc: Oral  Oral Oral  SpO2:   92% 95%  Weight:  98 kg (216 lb 0.8 oz)    Height:        Intake/Output Summary (Last 24 hours) at 01/01/2018 1305 Last data filed at 01/01/2018 1000 Gross per 24 hour  Intake 495 ml  Output 875 ml  Net -380 ml   Filed Weights   12/30/17 0434 12/31/17 0621 01/01/18 0500  Weight: 99.5 kg (219 lb 5.7 oz) 96.6 kg (212 lb 15.4 oz) 98 kg (216 lb 0.8 oz)     Physical Exam:   Constitutional: Deconditioned, in no acute pain. ENMT: White candidal plaques on tongue. Cardiovascular: Rhythmic, no m/r/g. No LE edema.  Respiratory: Decreased breath sounds, diffuse rhonchi, no wheeze or crackles. Normal respiratory effort. No accessory muscle use.  Abdomen: No TTP. Bowel sounds positive.  Musculoskeletal: No clubbing / cyanosis. No joint deformity  upper and lower extremities. Normal muscle tone.    Data Reviewed:   I have independently reviewed following labs and imaging studies:   CBC: Recent Labs  Lab 12-30-17 1115 12/28/17 0339 12/29/17 0437 12/30/17 0640 01/01/18 0352  WBC 18.7* 15.9* 13.5* 11.6* 15.3*  NEUTROABS  --  13.3* 10.3* 8.1* 12.0*  HGB 11.4* 11.6* 11.4* 11.2* 10.7*  HCT 34.8* 35.3* 36.0* 35.1* 33.9*  MCV 86.8 86.9 88.0 86.9 88.5  PLT 376 353 409* 387 563*   Basic Metabolic Panel: Recent Labs  Lab 12/26/17 0554 12-30-17 1115 12/28/17 0339 12/29/17 0437 12/30/17 0640  01/01/18 0352  NA 141 138 136 138 135 135  K 3.8 4.0 3.5 4.0 4.1 3.9  CL 109 106 104 104 101 100*  CO2 21* 23 23 26 24 24   GLUCOSE 151* 274* 136* 168* 176* 172*  BUN 34* 30* 23* 20 20 30*  CREATININE 1.37* 1.28* 1.13 1.00 1.23 1.10  CALCIUM 8.4* 8.1* 7.9* 8.0* 7.9* 8.0*  MG 1.5* 2.0 2.0  --   --   --   PHOS 2.1* 2.2*  --   --   --   --    GFR: Estimated Creatinine Clearance: 62.9 mL/min (by C-G formula based on SCr of 1.1 mg/dL). Liver Function Tests: No results for input(s): AST, ALT, ALKPHOS, BILITOT, PROT, ALBUMIN in the last 168 hours. No results for input(s): LIPASE, AMYLASE in the last 168 hours. No results for input(s): AMMONIA in the last 168 hours. Coagulation Profile: No results for input(s): INR, PROTIME in the last 168 hours. Cardiac Enzymes: Recent Labs  Lab 12/25/17 1446  TROPONINI 0.03*   BNP (last 3 results) No results for input(s): PROBNP in the last 8760 hours. HbA1C: No results for input(s): HGBA1C in the last 72 hours. CBG: Recent Labs  Lab 12/31/17 1148 12/31/17 1705 12/31/17 2158 01/01/18 0757 01/01/18 1209  GLUCAP 331* 343* 243* 160* 261*   Lipid Profile: No results for input(s): CHOL, HDL, LDLCALC, TRIG, CHOLHDL, LDLDIRECT in the last 72 hours. Thyroid Function Tests: No results for input(s): TSH, T4TOTAL, FREET4, T3FREE, THYROIDAB in the last 72 hours. Anemia Panel: No results for input(s): VITAMINB12, FOLATE, FERRITIN, TIBC, IRON, RETICCTPCT in the last 72 hours. Urine analysis:    Component Value Date/Time   COLORURINE YELLOW 12/25/2017 1534   APPEARANCEUR HAZY (A) 12/25/2017 1534   LABSPEC 1.028 12/25/2017 1534   PHURINE 5.0 12/25/2017 1534   GLUCOSEU 50 (A) 12/25/2017 1534   HGBUR SMALL (A) 12/25/2017 1534   BILIRUBINUR NEGATIVE 12/25/2017 1534   KETONESUR 5 (A) 12/25/2017 1534   PROTEINUR 30 (A) 12/25/2017 1534   UROBILINOGEN 1.0 02/23/2012 0047   NITRITE NEGATIVE 12/25/2017 1534   LEUKOCYTESUR NEGATIVE 12/25/2017 1534    Sepsis Labs: Invalid input(s): PROCALCITONIN, LACTICIDVEN  Recent Results (from the past 240 hour(s))  Culture, blood (Routine x 2)     Status: None   Collection Time: 12/25/17 10:18 AM  Result Value Ref Range Status   Specimen Description   Final    BLOOD LEFT WRIST Performed at [facility] [address], [place], [place] [address]    Special Requests   Final    IN PEDIATRIC BOTTLE Blood Culture adequate volume Performed at [facility] [address] [place], [place] [address]    Culture   Final    NO GROWTH 5 DAYS Performed at [facility], [place] [address] [place], [place] [address]    Report Status 12/30/2017 FINAL  Final  Culture, blood (Routine x 2)     Status: Abnormal   Collection Time: 12/25/17 10:26 AM  Result Value Ref Range Status   Specimen Description   Final    BLOOD RIGHT HAND Performed at [facility] [address] [place], [place] [address]    Special Requests   Final    IN PEDIATRIC BOTTLE Blood Culture adequate volume Performed at [facility] [address], [place], [place] [address]    Culture  Setup Time   Final    GRAM POSITIVE COCCI IN PEDIATRIC BOTTLE CRITICAL RESULT CALLED TO, READ BACK BY AND VERIFIED WITH: J GRIMSLEY [facility] 12/26/17 0640 JDW    Culture (A)  Final    STAPHYLOCOCCUS SPECIES (COAGULASE NEGATIVE) THE SIGNIFICANCE OF ISOLATING THIS ORGANISM FROM A SINGLE SET OF BLOOD CULTURES WHEN MULTIPLE SETS ARE DRAWN IS UNCERTAIN. PLEASE NOTIFY THE MICROBIOLOGY DEPARTMENT WITHIN ONE WEEK IF SPECIATION AND SENSITIVITIES ARE REQUIRED. Performed at [facility], [place] [address] [place], [place] [address]    Report Status 12/27/2017 FINAL  Final  Blood Culture ID Panel (Reflexed)     Status: Abnormal   Collection Time: 12/25/17 10:26 AM  Result Value Ref Range Status   Enterococcus species NOT DETECTED NOT DETECTED Final   Listeria monocytogenes  NOT DETECTED NOT DETECTED Final   Staphylococcus species DETECTED (A) NOT DETECTED Final    Comment: Methicillin (oxacillin) resistant coagulase negative staphylococcus. Possible blood culture contaminant (unless isolated from more than one blood culture draw or clinical case suggests pathogenicity). No antibiotic treatment is indicated for blood  culture contaminants. CRITICAL RESULT CALLED TO, READ BACK BY AND VERIFIED WITH: J GRIMSLEY [facility] 12/26/17 0640 JDW    Staphylococcus aureus NOT DETECTED NOT DETECTED Final   Methicillin resistance DETECTED (A) NOT DETECTED Final    Comment: CRITICAL RESULT CALLED TO, READ BACK BY AND VERIFIED WITH: J GRIMSLEY PHARMD 12/26/17 0640 JDW    Streptococcus species NOT DETECTED NOT DETECTED Final   Streptococcus agalactiae NOT DETECTED NOT DETECTED Final   Streptococcus pneumoniae NOT DETECTED NOT DETECTED Final   Streptococcus pyogenes NOT DETECTED NOT DETECTED Final   Acinetobacter baumannii NOT DETECTED NOT DETECTED Final   Enterobacteriaceae species NOT DETECTED NOT DETECTED Final   Enterobacter cloacae complex NOT DETECTED NOT DETECTED Final   Escherichia coli NOT DETECTED NOT DETECTED Final   Klebsiella oxytoca NOT DETECTED NOT DETECTED Final   Klebsiella pneumoniae NOT DETECTED NOT DETECTED Final   Proteus species NOT DETECTED NOT DETECTED Final   Serratia marcescens NOT DETECTED NOT DETECTED Final   Haemophilus influenzae NOT DETECTED NOT DETECTED Final   Neisseria meningitidis NOT DETECTED NOT DETECTED Final   Pseudomonas aeruginosa NOT DETECTED NOT DETECTED Final   Candida albicans NOT DETECTED NOT DETECTED Final   Candida glabrata NOT DETECTED NOT DETECTED Final   Candida krusei NOT DETECTED NOT DETECTED Final   Candida parapsilosis NOT DETECTED NOT DETECTED Final   Candida tropicalis NOT DETECTED NOT DETECTED Final    Comment: Performed at [facility], [place] [address] [place], [place] [address]  Urine culture     Status: None    Collection Time: 12/25/17  3:34 PM  Result Value Ref Range Status   Specimen Description   Final    URINE, RANDOM Performed at [facility] [address], [place], [place] [address]    Special Requests   Final    NONE Performed at [facility],  [place] [address] [place], [place] [address]    Culture   Final    NO GROWTH Performed at [facility], [place] [address] [place], [place] [address]    Report Status 12/26/2017 FINAL  Final  MRSA PCR Screening     Status: None   Collection Time: 12/25/17  9:01 PM  Result Value Ref Range Status   MRSA by PCR NEGATIVE NEGATIVE Final    Comment:        The GeneXpert MRSA Assay (FDA approved for NASAL specimens only), is one component of a comprehensive MRSA colonization surveillance program. It is not intended to diagnose MRSA infection nor to guide or monitor treatment for MRSA infections. Performed at [facility], [place] [address], [place], [place] [address]   Aerobic/Anaerobic Culture (surgical/deep wound)     Status: None (Preliminary result)   Collection Time: 12/26/17  4:46 PM  Result Value Ref Range Status   Specimen Description   Final    ABSCESS PERIHEPATIC Performed at [facility] [address], [place], [place] [address]    Special Requests NONE SAMPLE B  Final   Gram Stain   Final    ABUNDANT WBC PRESENT, PREDOMINANTLY PMN RARE GRAM POSITIVE COCCI    Culture   Final    RARE ENTEROCOCCUS FAECALIS RARE GRAM NEGATIVE RODS MODERATE BACTEROIDES FRAGILIS BETA LACTAMASE POSITIVE Performed at [facility], [place] [address], [place], [place] [address]    Report Status PENDING  Incomplete   Organism ID, Bacteria ENTEROCOCCUS FAECALIS  Final      Susceptibility   Enterococcus faecalis - MIC*    AMPICILLIN <=2 SENSITIVE Sensitive     VANCOMYCIN 1 SENSITIVE Sensitive     GENTAMICIN SYNERGY SENSITIVE Sensitive     * RARE ENTEROCOCCUS  FAECALIS  Aerobic/Anaerobic Culture (surgical/deep wound)     Status: None   Collection Time: 12/26/17  4:46 PM  Result Value Ref Range Status   Specimen Description GALL BLADDER  Final   Special Requests  SAMPLE A  Final   Gram Stain   Final    ABUNDANT WBC PRESENT, PREDOMINANTLY PMN FEW GRAM POSITIVE COCCI FEW GRAM NEGATIVE RODS    Culture   Final    ABUNDANT ENTEROCOCCUS FAECALIS ABUNDANT ESCHERICHIA COLI Confirmed Extended Spectrum Beta-Lactamase Producer (ESBL).  In bloodstream infections from ESBL organisms, carbapenems are preferred over piperacillin/tazobactam. They are shown to have a lower risk of mortality. ABUNDANT BACTEROIDES FRAGILIS BETA LACTAMASE POSITIVE Performed at [facility], [place] [address] [place], [place] [address]    Report Status 12/31/2017 FINAL  Final   Organism ID, Bacteria ENTEROCOCCUS FAECALIS  Final   Organism ID, Bacteria ESCHERICHIA COLI  Final      Susceptibility   Escherichia coli - MIC*    AMPICILLIN >=32 RESISTANT Resistant     CEFAZOLIN >=64 RESISTANT Resistant     CEFEPIME RESISTANT Resistant     CEFTAZIDIME RESISTANT Resistant     CEFTRIAXONE RESISTANT Resistant     CIPROFLOXACIN 1 SENSITIVE Sensitive     GENTAMICIN <=1 SENSITIVE Sensitive     IMIPENEM <=0.25 SENSITIVE Sensitive     TRIMETH/SULFA >=320 RESISTANT Resistant     AMPICILLIN/SULBACTAM >=32 RESISTANT Resistant     PIP/TAZO <=4 SENSITIVE Sensitive     Extended ESBL POSITIVE Resistant     * ABUNDANT ESCHERICHIA COLI   Enterococcus faecalis - MIC*    AMPICILLIN <=2 SENSITIVE Sensitive     VANCOMYCIN 1 SENSITIVE  Sensitive     GENTAMICIN SYNERGY SENSITIVE Sensitive     * ABUNDANT ENTEROCOCCUS FAECALIS  Culture, blood (routine x 2)     Status: None (Preliminary result)   Collection Time: 12/28/17  9:53 AM  Result Value Ref Range Status   Specimen Description   Final    BLOOD RIGHT ANTECUBITAL Performed at [facility] [address],  [place], [place] [address]    Special Requests   Final    BOTTLES DRAWN AEROBIC ONLY Blood Culture adequate volume Performed at [facility] [address], [place], [place] [address]    Culture   Final    NO GROWTH 4 DAYS Performed at [facility], [place] [address] [place], [place] [address]    Report Status PENDING  Incomplete  Culture, blood (routine x 2)     Status: None (Preliminary result)   Collection Time: 12/28/17 10:21 AM  Result Value Ref Range Status   Specimen Description   Final    BLOOD RIGHT HAND Performed at [facility] [address], [place], [place] [address]    Special Requests   Final    BOTTLES DRAWN AEROBIC ONLY Blood Culture adequate volume Performed at [facility] [address] [place], [place] [address]    Culture   Final    NO GROWTH 4 DAYS Performed at [facility], [place] [address], [place], [place] [address]    Report Status PENDING  Incomplete      Radiology Studies: [place] Ekg Site Rite  Result Date: 12/31/2017 If Site Rite image not attached, placement could not be confirmed due to current cardiac rhythm.   Medication:   . feeding supplement (GLUCERNA SHAKE)  237 mL Oral TID BM  . Gerhardt's butt cream   Topical QID  . insulin aspart  0-20 Units Subcutaneous TID AC & HS  . insulin glargine  15 Units Subcutaneous QHS  . lactobacillus  1 g Oral Q breakfast  . metoprolol tartrate  25 mg Oral BID  . nutrition supplement (JUVEN)  1 packet Oral BID WC  . OLANZapine  5 mg Oral QHS  . sodium chloride flush  5 mL Intracatheter Q12H    Continuous Infusions: . sodium chloride    . fluconazole (DIFLUCAN) IV    . imipenem-cilastatin 500 mg (01/01/18 1233)    Signed, Ave Filter, PA-S [place] PA Class of [phone]

## 2018-01-01 NOTE — Progress Notes (Signed)
Inpatient Diabetes Program Recommendations  AACE/ADA: New Consensus Statement on Inpatient Glycemic Control (2015)  Target Ranges:  Prepandial:   less than 140 mg/dL      Peak postprandial:   less than 180 mg/dL (1-2 hours)      Critically ill patients:  140 - 180 mg/dL   Results for Preston Moore, Preston Moore (MRN [id number]) as of 01/01/2018 09:14  Ref. Range 12/31/2017 07:37 12/31/2017 11:48 12/31/2017 17:05 12/31/2017 21:58  Glucose-Capillary Latest Ref Range: 65 - 99 mg/dL 181 (H) 331 (H) 343 (H) 243 (H)   Results for Preston Moore, Preston Moore (MRN [id number]) as of 01/01/2018 09:14  Ref. Range 01/01/2018 07:57  Glucose-Capillary Latest Ref Range: 65 - 99 mg/dL 160 (H)     Home DM Meds: Lantus 57 units QHS                             Novolog 15 units TID  Current Insulin Orders: Lantus 15 units QHS                                       Novolog Resistant Correction Scale/ SSI (0-20 units) TID AC + HS       MD- Please consider the following in-hospital insulin adjustments if within goals of care:  1. Increase Lantus to 20 units QHS  2. Change Novolog SSi to Q4 hour coverage- Pt eating poorly per PO documentation    --Will follow patient during hospitalization--  Wyn Quaker RN, MSN, CDE Diabetes Coordinator Inpatient Glycemic Control Team Team Pager: [phone] (8a-5p)

## 2018-01-01 NOTE — Progress Notes (Signed)
Portable equipment awaiting delivery of overlay air mattress.

## 2018-01-01 NOTE — Progress Notes (Addendum)
CC: abdominal pain  Subjective: Daughter says he seems a bit better and they can see a difference.     Objective: Vital signs in last 24 hours: Temp:  [97.9 F (36.6 C)-99.5 F (37.5 C)] 97.9 F (36.6 C) (03/19 0536) Pulse Rate:  [66-121] 94 (03/19 0536) Resp:  [18-21] 18 (03/19 0536) BP: (92-145)/(51-81) 120/69 (03/19 0536) SpO2:  [91 %-96 %] 92 % (03/19 0536) Weight:  [98 kg (216 lb 0.8 oz)] 98 kg (216 lb 0.8 oz) (03/19 0500) Last BM Date: 12/30/17 70 Po 400 IV 700 urine 225 from drains No BM recorded Afebrile, VSS WBC still up  Intake/Output from previous day: 03/18 0701 - 03/19 0700 In: 500 [P.O.:70; IV Piggyback:400] Out: 925 [Urine:700; Drains:225] Intake/Output this shift: No intake/output data recorded.  General appearance: alert, cooperative and no distress GI: softer less tender, drains working well.  Lab Results:  Recent Labs    12/30/17 0640 01/01/18 0352  WBC 11.6* 15.3*  HGB 11.2* 10.7*  HCT 35.1* 33.9*  PLT 387 432*    BMET Recent Labs    12/30/17 0640 01/01/18 0352  NA 135 135  K 4.1 3.9  CL 101 100*  CO2 24 24  GLUCOSE 176* 172*  BUN 20 30*  CREATININE 1.23 1.10  CALCIUM 7.9* 8.0*   PT/INR No results for input(s): LABPROT, INR in the last 72 hours.  Recent Labs  Lab 12/25/17 1026  AST 113*  ALT 82*  ALKPHOS 108  BILITOT 0.8  PROT 7.2  ALBUMIN 2.1*     Lipase     Component Value Date/Time   LIPASE 33 02/27/2012 0500     Medications: . feeding supplement (GLUCERNA SHAKE)  237 mL Oral TID BM  . Gerhardt's butt cream   Topical QID  . insulin aspart  0-20 Units Subcutaneous TID AC & HS  . insulin glargine  15 Units Subcutaneous QHS  . lactobacillus  1 g Oral Q breakfast  . metoprolol tartrate  25 mg Oral BID  . nutrition supplement (JUVEN)  1 packet Oral BID WC  . OLANZapine  5 mg Oral QHS  . sodium chloride flush  5 mL Intracatheter Q12H   Anti-infectives (From admission, onward)   Start     Dose/Rate  Route Frequency Ordered Stop   12/30/17 1230  imipenem-cilastatin (PRIMAXIN) 500 mg in sodium chloride 0.9 % 100 mL IVPB     500 mg 200 mL/hr over 30 Minutes Intravenous Every 6 hours 12/30/17 1132     12/28/17 1400  piperacillin-tazobactam (ZOSYN) IVPB 3.375 g  Status:  Discontinued     3.375 g 12.5 mL/hr over 240 Minutes Intravenous Every 8 hours 12/28/17 0851 12/30/17 1127   12/28/17 1000  ciprofloxacin (CIPRO) IVPB 200 mg  Status:  Discontinued     200 mg 100 mL/hr over 60 Minutes Intravenous Every 12 hours 12/28/17 0833 12/28/17 0848   12/27/17 2200  meropenem (MERREM) 1 g in sodium chloride 0.9 % 100 mL IVPB  Status:  Discontinued     1 g 200 mL/hr over 30 Minutes Intravenous Every 8 hours 12/27/17 1208 12/28/17 0833   12/25/17 2200  linezolid (ZYVOX) IVPB 600 mg  Status:  Discontinued     600 mg 300 mL/hr over 60 Minutes Intravenous Every 12 hours 12/25/17 1133 12/28/17 0833   12/25/17 2200  meropenem (MERREM) 1 g in sodium chloride 0.9 % 100 mL IVPB  Status:  Discontinued     1 g 200 mL/hr over 30  Minutes Intravenous Every 12 hours 12/25/17 1133 12/27/17 1208   12/25/17 1115  linezolid (ZYVOX) IVPB 600 mg     600 mg 300 mL/hr over 60 Minutes Intravenous STAT 12/25/17 1108 12/25/17 1346   12/25/17 1115  meropenem (MERREM) 1 g in sodium chloride 0.9 % 100 mL IVPB     1 g 200 mL/hr over 30 Minutes Intravenous STAT 12/25/17 1108 12/25/17 1244     Assessment/Plan Bilateral pneumonia/atelectasis Acute kidney disease Systolic heart failure with an EF 35%/Mild congestive failure Possible recurrent pneumonia Altered mental status-acute on chronic metabolic encephalopathy. Failure to thrive/significantly decreased mobility PAD with right medial heel ulcer DNR  Sepsis/Lactic acidosis Bacteremia - MRSA Liver abscess-subdiaphragmatic fluid collection/possible acute cholecystitis IR perihepatic abscess drainage; percutaneous cholecystostomy 12/26/17 - drain cultures:  -  Enterococcus fecalis/E Coli on GB and liver abscess cultures - antibiotics have been      adjusted.    FEN:IV fluids/full liquids ID: Linezolid 3/12- 3/14/Meropenem 3/12 - 3/15; Zosyn 3/15- 3/17;  Primaxin 3/17 =>> day 3 DVT: SCD - He can have DVT prophylaxis from our standpoint Foley: In Follow up: Dr. Marlou Starks after drains are removed about 6 weeks   Plan:  Follow up with Dr. Marlou Starks and IR.      LOS: 7 days    Micaella Gitto 01/01/2018 [phone]

## 2018-01-02 DIAGNOSIS — K819 Cholecystitis, unspecified: Secondary | ICD-10-CM

## 2018-01-02 DIAGNOSIS — K659 Peritonitis, unspecified: Secondary | ICD-10-CM

## 2018-01-02 DIAGNOSIS — E119 Type 2 diabetes mellitus without complications: Secondary | ICD-10-CM

## 2018-01-02 DIAGNOSIS — K750 Abscess of liver: Secondary | ICD-10-CM

## 2018-01-02 LAB — CULTURE, BLOOD (ROUTINE X 2)
Culture: NO GROWTH
Culture: NO GROWTH
SPECIAL REQUESTS: ADEQUATE
Special Requests: ADEQUATE

## 2018-01-02 LAB — CBC WITH DIFFERENTIAL/PLATELET
Basophils Absolute: 0 10*3/uL (ref 0.0–0.1)
Basophils Relative: 0 %
Eosinophils Absolute: 0.1 10*3/uL (ref 0.0–0.7)
Eosinophils Relative: 1 %
HEMATOCRIT: 33.4 % — AB (ref 39.0–52.0)
Hemoglobin: 10.7 g/dL — ABNORMAL LOW (ref 13.0–17.0)
LYMPHS ABS: 2.2 10*3/uL (ref 0.7–4.0)
LYMPHS PCT: 15 %
MCH: 27.9 pg (ref 26.0–34.0)
MCHC: 32 g/dL (ref 30.0–36.0)
MCV: 87.2 fL (ref 78.0–100.0)
Monocytes Absolute: 0.9 10*3/uL (ref 0.1–1.0)
Monocytes Relative: 6 %
NEUTROS PCT: 78 %
Neutro Abs: 11.1 10*3/uL — ABNORMAL HIGH (ref 1.7–7.7)
Platelets: 389 10*3/uL (ref 150–400)
RBC: 3.83 MIL/uL — AB (ref 4.22–5.81)
RDW: 15.2 % (ref 11.5–15.5)
WBC: 14.2 10*3/uL — AB (ref 4.0–10.5)

## 2018-01-02 LAB — BASIC METABOLIC PANEL
ANION GAP: 10 (ref 5–15)
BUN: 36 mg/dL — ABNORMAL HIGH (ref 6–20)
CHLORIDE: 98 mmol/L — AB (ref 101–111)
CO2: 24 mmol/L (ref 22–32)
Calcium: 7.8 mg/dL — ABNORMAL LOW (ref 8.9–10.3)
Creatinine, Ser: 1.12 mg/dL (ref 0.61–1.24)
GFR calc non Af Amer: 60 mL/min — ABNORMAL LOW (ref >60)
Glucose, Bld: 185 mg/dL — ABNORMAL HIGH (ref 65–99)
Potassium: 5.1 mmol/L (ref 3.5–5.1)
Sodium: 132 mmol/L — ABNORMAL LOW (ref 135–145)

## 2018-01-02 LAB — GLUCOSE, CAPILLARY
Glucose-Capillary: 163 mg/dL — ABNORMAL HIGH (ref 65–99)
Glucose-Capillary: 243 mg/dL — ABNORMAL HIGH (ref 65–99)
Glucose-Capillary: 192 mg/dL — ABNORMAL HIGH (ref 65–99)
Glucose-Capillary: 227 mg/dL — ABNORMAL HIGH (ref 65–99)

## 2018-01-02 LAB — MAGNESIUM: Magnesium: 1.8 mg/dL (ref 1.7–2.4)

## 2018-01-02 MED ORDER — FUROSEMIDE 10 MG/ML IJ SOLN
20.0000 mg | Freq: Two times a day (BID) | INTRAMUSCULAR | Status: DC
  Administered 2018-01-02 – 2018-01-05 (×6): 20 mg via INTRAVENOUS
  Filled 2018-01-02 (×6): qty 2

## 2018-01-02 MED ORDER — BACLOFEN 1 MG/ML ORAL SUSPENSION: 5.0000 mg | Freq: Three times a day (TID) | ORAL | Status: DC

## 2018-01-02 MED ORDER — BACLOFEN 10 MG PO TABS
5.0000 mg | ORAL_TABLET | Freq: Three times a day (TID) | ORAL | Status: DC
  Administered 2018-01-02 – 2018-01-05 (×5): 5 mg via ORAL
  Filled 2018-01-02 (×5): qty 1

## 2018-01-02 MED ORDER — FUROSEMIDE 10 MG/ML IJ SOLN
20.0000 mg | Freq: Once | INTRAMUSCULAR | Status: AC
  Administered 2018-01-02: 20 mg via INTRAVENOUS
  Filled 2018-01-02: qty 2

## 2018-01-02 MED ORDER — ALTEPLASE 2 MG IJ SOLR
2.0000 mg | Freq: Once | INTRAMUSCULAR | Status: AC
  Administered 2018-01-02: 2 mg
  Filled 2018-01-02: qty 2

## 2018-01-02 MED ORDER — INSULIN GLARGINE 100 UNIT/ML ~~LOC~~ SOLN
25.0000 [IU] | Freq: Every day | SUBCUTANEOUS | Status: DC
  Administered 2018-01-02 – 2018-01-03 (×2): 25 [IU] via SUBCUTANEOUS
  Filled 2018-01-02 (×3): qty 0.25

## 2018-01-02 NOTE — Progress Notes (Signed)
SLP received phone call from Dr Quincy Simmonds who reports concerns pt may be aspirating and desires MBS to be completed.  Will plan MBS 01/03/18, preferably in am *if xray can schedule.  Xray made aware.   Luanna Salk, [place] [facility] SLP [phone]

## 2018-01-02 NOTE — Progress Notes (Signed)
INFECTIOUS DISEASE PROGRESS NOTE  ID: Preston Moore is a 84 y.o. male with  Active Problems:   Septic shock ([place])   Protein-calorie malnutrition, severe   Pressure injury of skin  Subjective: Awake, alert, interactive.   Abtx:  Anti-infectives (From admission, onward)   Start     Dose/Rate Route Frequency Ordered Stop   01/01/18 1300  fluconazole (DIFLUCAN) IVPB 200 mg     200 mg 100 mL/hr over 60 Minutes Intravenous Every 24 hours 01/01/18 1235 01/08/18 1259   12/30/17 1230  imipenem-cilastatin (PRIMAXIN) 500 mg in sodium chloride 0.9 % 100 mL IVPB     500 mg 200 mL/hr over 30 Minutes Intravenous Every 6 hours 12/30/17 1132     12/28/17 1400  piperacillin-tazobactam (ZOSYN) IVPB 3.375 g  Status:  Discontinued     3.375 g 12.5 mL/hr over 240 Minutes Intravenous Every 8 hours 12/28/17 0851 12/30/17 1127   12/28/17 1000  ciprofloxacin (CIPRO) IVPB 200 mg  Status:  Discontinued     200 mg 100 mL/hr over 60 Minutes Intravenous Every 12 hours 12/28/17 0833 12/28/17 0848   12/27/17 2200  meropenem (MERREM) 1 g in sodium chloride 0.9 % 100 mL IVPB  Status:  Discontinued     1 g 200 mL/hr over 30 Minutes Intravenous Every 8 hours 12/27/17 1208 12/28/17 0833   12/25/17 2200  linezolid (ZYVOX) IVPB 600 mg  Status:  Discontinued     600 mg 300 mL/hr over 60 Minutes Intravenous Every 12 hours 12/25/17 1133 12/28/17 0833   12/25/17 2200  meropenem (MERREM) 1 g in sodium chloride 0.9 % 100 mL IVPB  Status:  Discontinued     1 g 200 mL/hr over 30 Minutes Intravenous Every 12 hours 12/25/17 1133 12/27/17 1208   12/25/17 1115  linezolid (ZYVOX) IVPB 600 mg     600 mg 300 mL/hr over 60 Minutes Intravenous STAT 12/25/17 1108 12/25/17 1346   12/25/17 1115  meropenem (MERREM) 1 g in sodium chloride 0.9 % 100 mL IVPB     1 g 200 mL/hr over 30 Minutes Intravenous STAT 12/25/17 1108 12/25/17 1244      Medications:  Scheduled: . baclofen  5 mg Oral TID  . feeding supplement (GLUCERNA SHAKE)   237 mL Oral TID BM  . furosemide  20 mg Intravenous BID  . Gerhardt's butt cream   Topical QID  . insulin aspart  0-20 Units Subcutaneous TID AC & HS  . insulin glargine  25 Units Subcutaneous QHS  . lactobacillus  1 g Oral Q breakfast  . metoprolol tartrate  25 mg Oral BID  . nutrition supplement (JUVEN)  1 packet Oral BID WC  . OLANZapine  5 mg Oral QHS  . sodium chloride flush  5 mL Intracatheter Q12H    Objective: Vital signs in last 24 hours: Temp:  [98 F (36.7 C)-99.2 F (37.3 C)] 99.2 F (37.3 C) (03/20 1500) Pulse Rate:  [62-99] 94 (03/20 1500) Resp:  [20] 20 (03/20 1500) BP: (118-137)/(55-68) 130/55 (03/20 1500) SpO2:  [93 %] 93 % (03/20 1500) Weight:  [93.4 kg (205 lb 14.6 oz)] 93.4 kg (205 lb 14.6 oz) (03/20 0500)   General appearance: alert and no distress Resp: clear to auscultation bilaterally Cardio: regular rate and rhythm GI: normal findings: bowel sounds normal and soft, non-tender Incision/Wound: drain with bilious fluid, pustulant fluid.   Lab Results Recent Labs    01/01/18 0352 01/02/18 0500 01/02/18 0858  WBC 15.3*  --  14.2*  HGB 10.7*  --  10.7*  HCT 33.9*  --  33.4*  NA 135 132*  --   K 3.9 5.1  --   CL 100* 98*  --   CO2 24 24  --   BUN 30* 36*  --   CREATININE 1.10 1.12  --    Liver Panel No results for input(s): PROT, ALBUMIN, AST, ALT, ALKPHOS, BILITOT, BILIDIR, IBILI in the last 72 hours. Sedimentation Rate No results for input(s): ESRSEDRATE in the last 72 hours. C-Reactive Protein No results for input(s): CRP in the last 72 hours.  Microbiology: Recent Results (from the past 240 hour(s))  Culture, blood (Routine x 2)     Status: None   Collection Time: 12/25/17 10:18 AM  Result Value Ref Range Status   Specimen Description   Final    BLOOD LEFT WRIST Performed at [facility] [address] [place], [place] [address]    Special Requests   Final    IN PEDIATRIC BOTTLE Blood Culture adequate  volume Performed at [facility] [address] [place], [place] [address]    Culture   Final    NO GROWTH 5 DAYS Performed at [facility], [place] [address] [place], [place] [address]    Report Status 12/30/2017 FINAL  Final  Culture, blood (Routine x 2)     Status: Abnormal   Collection Time: 12/25/17 10:26 AM  Result Value Ref Range Status   Specimen Description   Final    BLOOD RIGHT HAND Performed at [facility] [address] [place], [place] [address]    Special Requests   Final    IN PEDIATRIC BOTTLE Blood Culture adequate volume Performed at [facility] [address] [place], [place] [address]    Culture  Setup Time   Final    GRAM POSITIVE COCCI IN PEDIATRIC BOTTLE CRITICAL RESULT CALLED TO, READ BACK BY AND VERIFIED WITH: J GRIMSLEY [facility] 12/26/17 0640 JDW    Culture (A)  Final    STAPHYLOCOCCUS SPECIES (COAGULASE NEGATIVE) THE SIGNIFICANCE OF ISOLATING THIS ORGANISM FROM A SINGLE SET OF BLOOD CULTURES WHEN MULTIPLE SETS ARE DRAWN IS UNCERTAIN. PLEASE NOTIFY THE MICROBIOLOGY DEPARTMENT WITHIN ONE WEEK IF SPECIATION AND SENSITIVITIES ARE REQUIRED. Performed at [facility], [place] [address] [place], [place] [address]    Report Status 12/27/2017 FINAL  Final  Blood Culture ID Panel (Reflexed)     Status: Abnormal   Collection Time: 12/25/17 10:26 AM  Result Value Ref Range Status   Enterococcus species NOT DETECTED NOT DETECTED Final   Listeria monocytogenes NOT DETECTED NOT DETECTED Final   Staphylococcus species DETECTED (A) NOT DETECTED Final    Comment: Methicillin (oxacillin) resistant coagulase negative staphylococcus. Possible blood culture contaminant (unless isolated from more than one blood culture draw or clinical case suggests pathogenicity). No antibiotic treatment is indicated for blood  culture contaminants. CRITICAL RESULT CALLED TO, READ BACK BY AND VERIFIED WITH: J GRIMSLEY  PH[facility] 12/26/17 0640 JDW    Staphylococcus aureus NOT DETECTED NOT DETECTED Final   Methicillin resistance DETECTED (A) NOT DETECTED Final    Comment: CRITICAL RESULT CALLED TO, READ BACK BY AND VERIFIED WITH: J GRIMSLEY PHARMD 12/26/17 0640 JDW    Streptococcus species NOT DETECTED NOT DETECTED Final   Streptococcus agalactiae NOT DETECTED NOT DETECTED Final   Streptococcus pneumoniae NOT DETECTED NOT DETECTED Final   Streptococcus pyogenes NOT DETECTED NOT DETECTED Final  Acinetobacter baumannii NOT DETECTED NOT DETECTED Final   Enterobacteriaceae species NOT DETECTED NOT DETECTED Final   Enterobacter cloacae complex NOT DETECTED NOT DETECTED Final   Escherichia coli NOT DETECTED NOT DETECTED Final   Klebsiella oxytoca NOT DETECTED NOT DETECTED Final   Klebsiella pneumoniae NOT DETECTED NOT DETECTED Final   Proteus species NOT DETECTED NOT DETECTED Final   Serratia marcescens NOT DETECTED NOT DETECTED Final   Haemophilus influenzae NOT DETECTED NOT DETECTED Final   Neisseria meningitidis NOT DETECTED NOT DETECTED Final   Pseudomonas aeruginosa NOT DETECTED NOT DETECTED Final   Candida albicans NOT DETECTED NOT DETECTED Final   Candida glabrata NOT DETECTED NOT DETECTED Final   Candida krusei NOT DETECTED NOT DETECTED Final   Candida parapsilosis NOT DETECTED NOT DETECTED Final   Candida tropicalis NOT DETECTED NOT DETECTED Final    Comment: Performed at [facility], [place] [address], [place], [place] [address]  Urine culture     Status: None   Collection Time: 12/25/17  3:34 PM  Result Value Ref Range Status   Specimen Description   Final    URINE, RANDOM Performed at [facility] [address] [place], [place] [address]    Special Requests   Final    NONE Performed at [facility], [place] [address], [place], [place] [address]    Culture   Final    NO GROWTH Performed at [facility], [place] [address], [place], [place]  [address]    Report Status 12/26/2017 FINAL  Final  MRSA PCR Screening     Status: None   Collection Time: 12/25/17  9:01 PM  Result Value Ref Range Status   MRSA by PCR NEGATIVE NEGATIVE Final    Comment:        The GeneXpert MRSA Assay (FDA approved for NASAL specimens only), is one component of a comprehensive MRSA colonization surveillance program. It is not intended to diagnose MRSA infection nor to guide or monitor treatment for MRSA infections. Performed at [facility], [place] [address] [place], [place] [address]   Aerobic/Anaerobic Culture (surgical/deep wound)     Status: None (Preliminary result)   Collection Time: 12/26/17  4:46 PM  Result Value Ref Range Status   Specimen Description   Final    ABSCESS PERIHEPATIC Performed at [facility] [address], [place], [place] [address]    Special Requests NONE SAMPLE B  Final   Gram Stain   Final    ABUNDANT WBC PRESENT, PREDOMINANTLY PMN RARE GRAM POSITIVE COCCI    Culture   Final    RARE ENTEROCOCCUS FAECALIS RARE ESCHERICHIA COLI MODERATE BACTEROIDES FRAGILIS BETA LACTAMASE POSITIVE Confirmed Extended Spectrum Beta-Lactamase Producer (ESBL).  In bloodstream infections from ESBL organisms, carbapenems are preferred over piperacillin/tazobactam. They are shown to have a lower risk of mortality. Performed at [facility], [place] [address], [place], [place] [address]    Report Status PENDING  Incomplete   Organism ID, Bacteria ENTEROCOCCUS FAECALIS  Final   Organism ID, Bacteria ESCHERICHIA COLI  Final      Susceptibility   Escherichia coli - MIC*    AMPICILLIN >=32 RESISTANT Resistant     CEFAZOLIN >=64 RESISTANT Resistant     CEFEPIME RESISTANT Resistant     CEFTAZIDIME RESISTANT Resistant     CEFTRIAXONE RESISTANT Resistant     CIPROFLOXACIN 1 SENSITIVE Sensitive     GENTAMICIN <=1 SENSITIVE Sensitive  IMIPENEM <=0.25 SENSITIVE Sensitive     TRIMETH/SULFA >=320  RESISTANT Resistant     AMPICILLIN/SULBACTAM 16 INTERMEDIATE Intermediate     PIP/TAZO <=4 SENSITIVE Sensitive     Extended ESBL POSITIVE Resistant     * RARE ESCHERICHIA COLI   Enterococcus faecalis - MIC*    AMPICILLIN <=2 SENSITIVE Sensitive     VANCOMYCIN 1 SENSITIVE Sensitive     GENTAMICIN SYNERGY SENSITIVE Sensitive     * RARE ENTEROCOCCUS FAECALIS  Aerobic/Anaerobic Culture (surgical/deep wound)     Status: None   Collection Time: 12/26/17  4:46 PM  Result Value Ref Range Status   Specimen Description GALL BLADDER  Final   Special Requests  SAMPLE A  Final   Gram Stain   Final    ABUNDANT WBC PRESENT, PREDOMINANTLY PMN FEW GRAM POSITIVE COCCI FEW GRAM NEGATIVE RODS    Culture   Final    ABUNDANT ENTEROCOCCUS FAECALIS ABUNDANT ESCHERICHIA COLI Confirmed Extended Spectrum Beta-Lactamase Producer (ESBL).  In bloodstream infections from ESBL organisms, carbapenems are preferred over piperacillin/tazobactam. They are shown to have a lower risk of mortality. ABUNDANT BACTEROIDES FRAGILIS BETA LACTAMASE POSITIVE Performed at [facility], [place] [address], [place], [place] [address]    Report Status 12/31/2017 FINAL  Final   Organism ID, Bacteria ENTEROCOCCUS FAECALIS  Final   Organism ID, Bacteria ESCHERICHIA COLI  Final      Susceptibility   Escherichia coli - MIC*    AMPICILLIN >=32 RESISTANT Resistant     CEFAZOLIN >=64 RESISTANT Resistant     CEFEPIME RESISTANT Resistant     CEFTAZIDIME RESISTANT Resistant     CEFTRIAXONE RESISTANT Resistant     CIPROFLOXACIN 1 SENSITIVE Sensitive     GENTAMICIN <=1 SENSITIVE Sensitive     IMIPENEM <=0.25 SENSITIVE Sensitive     TRIMETH/SULFA >=320 RESISTANT Resistant     AMPICILLIN/SULBACTAM >=32 RESISTANT Resistant     PIP/TAZO <=4 SENSITIVE Sensitive     Extended ESBL POSITIVE Resistant     * ABUNDANT ESCHERICHIA COLI   Enterococcus faecalis - MIC*    AMPICILLIN <=2 SENSITIVE Sensitive     VANCOMYCIN 1 SENSITIVE Sensitive      GENTAMICIN SYNERGY SENSITIVE Sensitive     * ABUNDANT ENTEROCOCCUS FAECALIS  Culture, blood (routine x 2)     Status: None   Collection Time: 12/28/17  9:53 AM  Result Value Ref Range Status   Specimen Description   Final    BLOOD RIGHT ANTECUBITAL Performed at [facility] [address], [place], [place] [address]    Special Requests   Final    BOTTLES DRAWN AEROBIC ONLY Blood Culture adequate volume Performed at [facility] [address], [place], [place] [address]    Culture   Final    NO GROWTH 5 DAYS Performed at [facility], [place] [address], [place], [place] [address]    Report Status 01/02/2018 FINAL  Final  Culture, blood (routine x 2)     Status: None   Collection Time: 12/28/17 10:21 AM  Result Value Ref Range Status   Specimen Description   Final    BLOOD RIGHT HAND Performed at [facility] [address], [place], [place] [address]    Special Requests   Final    BOTTLES DRAWN AEROBIC ONLY Blood Culture adequate volume Performed at [facility] [address], [place], [place] [address]    Culture   Final  NO GROWTH 5 DAYS Performed at [facility], [place] [address] [place], [place] [address]    Report Status 01/02/2018 FINAL  Final    Studies/Results: Ct Abdomen Pelvis W Contrast  Result Date: 01/01/2018 CLINICAL DATA:  84 year old male with acalculous cholecystitis and liver abscess post drainage. Subsequent encounter. EXAM: CT ABDOMEN AND PELVIS WITH CONTRAST TECHNIQUE: Multidetector CT imaging of the abdomen and pelvis was performed using the standard protocol following bolus administration of intravenous contrast. CONTRAST:  121mL ISOVUE-300 IOPAMIDOL (ISOVUE-300) INJECTION 61% COMPARISON:  12/26/2017 drainage films.  12/25/2017 pre drainage CT. FINDINGS: Lower chest: Interval increase in size of right-sided pleural effusion (now moderate in size) which may be  loculated. Small left-sided pleural effusion. Bibasilar consolidation may represent atelectasis or infiltrate. Mild cardiomegaly. Coronary artery calcifications. Mitral and aortic valve calcifications. Hepatobiliary: Cholecystostomy tube has been place with decompression of gallbladder. Decrease in size of small abscesses within the liver adjacent to the gallbladder fossa largest measuring up to 1 cm versus prior 1.5 cm. Fluid collection along the anterior margin of the liver of similar size to slightly larger measuring 3.1 x 1.2 x 3.8 cm versus prior 3.1 x 1.4 by 3.8 cm. Pancreas: No pancreatic mass or primary pancreatic inflammation. Spleen: No splenic mass or enlargement. Adrenals/Urinary Tract: No obstructing stone or hydronephrosis. No worrisome renal or adrenal lesion. Noncontrast filled imaging of urinary bladder without gross abnormality although evaluation limited. Stomach/Bowel: No primary bowel inflammatory process. Vascular/Lymphatic: Atherosclerotic changes of tortuous aorta. Atherosclerotic changes aortic branch vessels. No abdominal aortic aneurysm or large vessel occlusion. Prominent narrowing origin superior mesenteric artery and right renal artery. No adenopathy. Reproductive: Prostate gland top-normal size. Other: Drain placed within the anterior right subphrenic abscess which has decreased significantly in size with a residual thickness of abscess measuring 1.4 cm versus prior 2.2 cm. Interval increase in size of left subphrenic collection now with maximal thickness of 1.6 cm versus prior 0.8 cm. Musculoskeletal: L1 superior endplate compression fracture once again noted and without change from recent exam. Degenerative changes L3-4 through L5-S1. Bilateral hip surgery with degenerative changes. IMPRESSION: Cholecystostomy tube has been place with decompression of gallbladder. Decrease in size of small abscesses within the liver adjacent to the gallbladder fossa largest measuring up to 1 cm  versus prior 1.5 cm. Fluid collection along the anterior margin of the liver of similar size to slightly larger measuring 3.1 x 1.2 x 3.8 cm versus prior 3.1 x 1.4 by 3.8 cm. Drain placed within the anterior right subphrenic abscess which has decreased significantly in size with a residual thickness of abscess measuring 1.4 cm versus prior 2.2 cm. Interval increase in size of left subphrenic collection now with maximal thickness of 1.6 cm versus prior 0.8 cm. Interval increase in size of right-sided pleural effusion (now moderate in size) and may be loculated. Small left-sided pleural effusion. Bibasilar consolidation may represent atelectasis or infiltrate. Aortic Atherosclerosis (ICD10-I70.0). Prominent narrowing origin superior mesenteric artery and right renal artery. Coronary artery calcification. L1 superior endplate compression fracture similar to recent CT. Electronically Signed   By: Genia Del M.D.   On: 01/01/2018 17:44   Dg Chest Port 1 View  Result Date: 01/01/2018 CLINICAL DATA:  Follow-up pneumonia, short of breath EXAM: PORTABLE CHEST 1 VIEW COMPARISON:  12/26/2017, 12/25/2017, 11/14/2017 FINDINGS: Left upper extremity catheter tip overlies the proximal right atrium. Development of small right-sided pleural effusion and probable small left effusion. Cardiomegaly with vascular congestion and mild pulmonary edema. Atherosclerosis of the aorta. No  pneumothorax. Drainage catheters in the right upper quadrant. IMPRESSION: 1. Left upper extremity catheter tip overlies the proximal right atrium 2. Development of small moderate right pleural effusion, likely layering and attributing to asymmetric hazy opacity in the right thorax 3. Cardiomegaly with vascular congestion and mild pulmonary edema. Probable small left effusion. Electronically Signed   By: Donavan Foil M.D.   On: 01/01/2018 18:27     Assessment/Plan: Acalculous cholecystitis Liver abscesses (4) ESBL E coli E faecalis Decubitus  ulcers, heels Candidiasis Oropharyngeal dysphagia DM2 Protein Calorie malnutrition (severe)  Total days of antibiotics: 8 (imipenem 3, flucon 2)  Appreciate IR note.  Would aim for 3 weeks of imipenem Stop flucon at day 7 Continue wound care.  Air mattress placed Appreciate nutrition f/u.   Need to define MD f/u, imaging f/u, goals of care          Bobby Rumpf MD, FACP Infectious Diseases (pager) [phone] [email or web address] 01/02/2018, 5:39 PM  LOS: 8 days

## 2018-01-02 NOTE — Progress Notes (Signed)
Inpatient Diabetes Program Recommendations  AACE/ADA: New Consensus Statement on Inpatient Glycemic Control (2015)  Target Ranges:  Prepandial:   less than 140 mg/dL      Peak postprandial:   less than 180 mg/dL (1-2 hours)      Critically ill patients:  140 - 180 mg/dL   Results for Preston Moore, Preston Moore (MRN [id number]) as of 01/02/2018 09:20  Ref. Range 01/01/2018 07:57 01/01/2018 12:09 01/01/2018 17:23 01/01/2018 21:19  Glucose-Capillary Latest Ref Range: 65 - 99 mg/dL 160 (H)  4 units Novolog  261 (H)  11 units Novolog  226 (H)  7 units Novolog  224 (H)  7 units Novolog +  15 units Lantus   Results for Preston Moore, Preston Moore (MRN [id number]) as of 01/02/2018 09:20  Ref. Range 01/02/2018 07:35  Glucose-Capillary Latest Ref Range: 65 - 99 mg/dL 163 (H)  4 units Novolog     Home DM Meds:Lantus 57 units QHS Novolog 15 units TID  Current Insulin Orders:Lantus 15 units QHS Novolog Resistant Correction Scale/ SSI (0-20 units) TID AC + HS       MD- Please consider the following in-hospital insulin adjustments if within goals of care:  1. Increase Lantus to 20 units QHS  2. Change Novolog SSi to Q4 hour coverage- Pt eating poorly per PO documentation.  Only taking in Glucerna and Juven supplements.     --Will follow patient during hospitalization--  Wyn Quaker RN, MSN, CDE Diabetes Coordinator Inpatient Glycemic Control Team Team Pager: [phone] (8a-5p)

## 2018-01-02 NOTE — Progress Notes (Signed)
Referring Physician(s): Toth,P  Supervising Physician: Arne Cleveland  Patient Status:  [name] - In-pt  Chief Complaint:  cholecystitis  Subjective: Pt watching TV; will nod to questions; denies abd pain; no family in room   Allergies: Patient has no known allergies.  Medications: Prior to Admission medications   Medication Sig Start Date End Date Taking? Authorizing Provider  acetaminophen (TYLENOL) 500 MG tablet Take 1,000 mg by mouth every 8 (eight) hours as needed for moderate pain.  09/28/17  Yes [provider]  albuterol (ACCUNEB) 0.63 MG/3ML nebulizer solution Take 3 mLs (0.63 mg total) by nebulization every 2 (two) hours as needed for wheezing or shortness of breath. 11/17/17  Yes Ghimire, Henreitta Leber, MD  albuterol (PROVENTIL) (2.5 MG/3ML) 0.083% nebulizer solution Take 3 mLs (2.5 mg total) by nebulization 3 (three) times daily. 11/17/17  Yes Ghimire, Henreitta Leber, MD  Amino Acids-Protein Hydrolys (FEEDING SUPPLEMENT, PRO-STAT SUGAR FREE 64,) LIQD Take 30 mLs 2 (two) times daily by mouth.   Yes [provider]  atorvastatin (LIPITOR) 10 MG tablet Take 10 mg by mouth at bedtime. 10/11/17  Yes [provider]  benzonatate (TESSALON) 200 MG capsule Take 1 capsule (200 mg total) by mouth 3 (three) times daily as needed for cough. 11/17/17  Yes Ghimire, Henreitta Leber, MD  Cholecalciferol (VITAMIN D3) 10000 units TABS Take 1,000 Units by mouth daily.   Yes [provider]  docusate sodium (COLACE) 100 MG capsule Take 1 capsule (100 mg total) by mouth 2 (two) times daily. 04/10/17  Yes Reyne Dumas, MD  ferrous sulfate 325 (65 FE) MG tablet Take 325 mg by mouth daily with breakfast.    Yes [provider]  furosemide (LASIX) 40 MG tablet Take 40 mg by mouth daily.    Yes [provider]  gabapentin (NEURONTIN) 300 MG capsule Take 300 mg by mouth 3 (three) times daily.    Yes [provider]  insulin aspart (NOVOLOG) 100 UNIT/ML  injection Inject 15 Units into the skin 3 (three) times daily with meals. Inject 11 units subcutaneously after meals 10/26/17  Yes [provider]  insulin glargine (LANTUS) 100 UNIT/ML injection Inject 57 Units into the skin at bedtime.    Yes [provider]  ipratropium-albuterol (DUONEB) 0.5-2.5 (3) MG/3ML SOLN Take 3 mLs by nebulization every 6 (six) hours as needed (sob and wheezing). X 3 days and every 6 hours as needed x 14 days  12/24/17 01/07/18 Yes [provider]  lisinopril (PRINIVIL,ZESTRIL) 5 MG tablet Take 5 mg by mouth daily.   Yes [provider]  metoprolol succinate (TOPROL-XL) 25 MG 24 hr tablet Take 25 mg by mouth daily.   Yes [provider]  Multiple Vitamin (MULTIVITAMIN) tablet Take 1 tablet by mouth daily.   Yes [provider]  Nutritional Supplements (NUTRITIONAL SHAKE PO) House Shake - Give 120cc by mouth three times daily   Yes [provider]  ondansetron (ZOFRAN) 4 MG tablet Take 4 mg by mouth every 6 (six) hours as needed for nausea or vomiting. 12/19/17  Yes [provider]  pantoprazole (PROTONIX) 40 MG tablet Take 40 mg by mouth daily.   Yes [provider]  polyethylene glycol (MIRALAX / GLYCOLAX) packet Take 17 g by mouth 2 (two) times daily.    Yes [provider]  potassium chloride SA (K-DUR,KLOR-CON) 20 MEQ tablet Take 1 tablet (20 mEq total) by mouth daily. 11/18/17  Yes Ghimire, Henreitta Leber, MD  Probiotic Product (  PROBIOTIC DAILY) CAPS Take 1 capsule by mouth daily. 12/21/17 01/20/18 Yes [provider]  promethazine (PHENERGAN) 25 MG suppository Place 25 mg rectally every 6 (six) hours as needed for nausea or vomiting. X 3 days 12/24/17 12/27/17 Yes [provider]  Skin Protectants, Misc. (CALAZIME SKIN PROTECTANT EX) Apply 1 application topically 2 (two) times daily. Apply to buttock every shift    Yes [provider]  Skin Protectants, Misc. (EUCERIN)  cream Apply to BLE topically two times daily for dry skin   Yes [provider]  traMADol (ULTRAM) 50 MG tablet Take 25 mg by mouth every 12 (twelve) hours as needed for moderate pain.    Yes [provider]  traZODone (DESYREL) 100 MG tablet Take 100 mg by mouth at bedtime as needed for sleep.   Yes [provider]  LevoFLOXacin in D5W (LEVAQUIN IV) Inject 750 mg into the vein. Every other day for 7 administrations 12/21/17 01/04/18  [provider]  lisinopril (ZESTRIL) 2.5 MG tablet Take 2 tablets (5 mg total) by mouth daily. Patient not taking: Reported on 12/25/2017 11/17/17 11/17/18  Jonetta Osgood, MD     Vital Signs: BP (!) 130/55 (BP Location: Right Arm)   Pulse 94   Temp 99.2 F (37.3 C) (Axillary)   Resp 20   Ht 5\' 11"  (1.803 m)   Wt 205 lb 14.6 oz (93.4 kg)   SpO2 93%   BMI 28.72 kg/m   Physical Exam GB/perihepatic drains intact, insertion sites ok; no leaking noted; perihepatic drain flushed with little return  Imaging: Ct Abdomen Pelvis W Contrast  Result Date: 01/01/2018 CLINICAL DATA:  84 year old male with acalculous cholecystitis and liver abscess post drainage. Subsequent encounter. EXAM: CT ABDOMEN AND PELVIS WITH CONTRAST TECHNIQUE: Multidetector CT imaging of the abdomen and pelvis was performed using the standard protocol following bolus administration of intravenous contrast. CONTRAST:  1105mL ISOVUE-300 IOPAMIDOL (ISOVUE-300) INJECTION 61% COMPARISON:  12/26/2017 drainage films.  12/25/2017 pre drainage CT. FINDINGS: Lower chest: Interval increase in size of right-sided pleural effusion (now moderate in size) which may be loculated. Small left-sided pleural effusion. Bibasilar consolidation may represent atelectasis or infiltrate. Mild cardiomegaly. Coronary artery calcifications. Mitral and aortic valve calcifications. Hepatobiliary: Cholecystostomy tube has been place with decompression of gallbladder. Decrease in size of small  abscesses within the liver adjacent to the gallbladder fossa largest measuring up to 1 cm versus prior 1.5 cm. Fluid collection along the anterior margin of the liver of similar size to slightly larger measuring 3.1 x 1.2 x 3.8 cm versus prior 3.1 x 1.4 by 3.8 cm. Pancreas: No pancreatic mass or primary pancreatic inflammation. Spleen: No splenic mass or enlargement. Adrenals/Urinary Tract: No obstructing stone or hydronephrosis. No worrisome renal or adrenal lesion. Noncontrast filled imaging of urinary bladder without gross abnormality although evaluation limited. Stomach/Bowel: No primary bowel inflammatory process. Vascular/Lymphatic: Atherosclerotic changes of tortuous aorta. Atherosclerotic changes aortic branch vessels. No abdominal aortic aneurysm or large vessel occlusion. Prominent narrowing origin superior mesenteric artery and right renal artery. No adenopathy. Reproductive: Prostate gland top-normal size. Other: Drain placed within the anterior right subphrenic abscess which has decreased significantly in size with a residual thickness of abscess measuring 1.4 cm versus prior 2.2 cm. Interval increase in size of left subphrenic collection now with maximal thickness of 1.6 cm versus prior 0.8 cm. Musculoskeletal: L1 superior endplate compression fracture once again noted and without change from recent exam. Degenerative changes L3-4 through L5-S1. Bilateral hip surgery with degenerative  changes. IMPRESSION: Cholecystostomy tube has been place with decompression of gallbladder. Decrease in size of small abscesses within the liver adjacent to the gallbladder fossa largest measuring up to 1 cm versus prior 1.5 cm. Fluid collection along the anterior margin of the liver of similar size to slightly larger measuring 3.1 x 1.2 x 3.8 cm versus prior 3.1 x 1.4 by 3.8 cm. Drain placed within the anterior right subphrenic abscess which has decreased significantly in size with a residual thickness of abscess  measuring 1.4 cm versus prior 2.2 cm. Interval increase in size of left subphrenic collection now with maximal thickness of 1.6 cm versus prior 0.8 cm. Interval increase in size of right-sided pleural effusion (now moderate in size) and may be loculated. Small left-sided pleural effusion. Bibasilar consolidation may represent atelectasis or infiltrate. Aortic Atherosclerosis (ICD10-I70.0). Prominent narrowing origin superior mesenteric artery and right renal artery. Coronary artery calcification. L1 superior endplate compression fracture similar to recent CT. Electronically Signed   By: Genia Del M.D.   On: 01/01/2018 17:44   Dg Chest Port 1 View  Result Date: 01/01/2018 CLINICAL DATA:  Follow-up pneumonia, short of breath EXAM: PORTABLE CHEST 1 VIEW COMPARISON:  12/26/2017, 12/25/2017, 11/14/2017 FINDINGS: Left upper extremity catheter tip overlies the proximal right atrium. Development of small right-sided pleural effusion and probable small left effusion. Cardiomegaly with vascular congestion and mild pulmonary edema. Atherosclerosis of the aorta. No pneumothorax. Drainage catheters in the right upper quadrant. IMPRESSION: 1. Left upper extremity catheter tip overlies the proximal right atrium 2. Development of small moderate right pleural effusion, likely layering and attributing to asymmetric hazy opacity in the right thorax 3. Cardiomegaly with vascular congestion and mild pulmonary edema. Probable small left effusion. Electronically Signed   By: Donavan Foil M.D.   On: 01/01/2018 18:27   [place] Ekg Site Rite  Result Date: 12/31/2017 If Site Rite image not attached, placement could not be confirmed due to current cardiac rhythm.   Labs:  CBC: Recent Labs    12/29/17 0437 12/30/17 0640 01/01/18 0352 01/02/18 0858  WBC 13.5* 11.6* 15.3* 14.2*  HGB 11.4* 11.2* 10.7* 10.7*  HCT 36.0* 35.1* 33.9* 33.4*  PLT 409* 387 432* 389    COAGS: Recent Labs    04/06/17 1636 11/15/17 0414  12/25/17 1121  INR 0.96 1.15 1.21  APTT  --  31  --     BMP: Recent Labs    12/29/17 0437 12/30/17 0640 01/01/18 0352 01/02/18 0500  NA 138 135 135 132*  K 4.0 4.1 3.9 5.1  CL 104 101 100* 98*  CO2 26 24 24 24   GLUCOSE 168* 176* 172* 185*  BUN 20 20 30* 36*  CALCIUM 8.0* 7.9* 8.0* 7.8*  CREATININE 1.00 1.23 1.10 1.12  GFRNONAA >60 53* >60 60*  GFRAA >60 >60 >60 >60    LIVER FUNCTION TESTS: Recent Labs    11/14/17 2130 11/15/17 0414 11/16/17 0550 12/25/17 1026  BILITOT 0.6 0.8 0.5 0.8  AST 47* 31 53* 113*  ALT 36 29 45 82*  ALKPHOS 98 75 71 108  PROT 8.0 6.6 6.7 7.2  ALBUMIN 2.9* 2.4* 2.4* 2.1*    Assessment and Plan: Patient with history of acute cholecystitis/perihepatic fluid collection,status post percutaneous cholecystostomy and perihepatic fluid drainage on 12/26/17;afebrile;  blood cx- neg to date; drain fluid cx- enterococcus/e coli/bacteroides;WBC 14.2(15.3), hgb stable; creat nl; latest CT reviewed by Dr. Vernard Gambles- no additional collections present which need drains; maintain perihepatic drain for now since small amt residual  fluid remains at site; GB drain should remain in place for at least 4-6 weeks unless cholecystectomy done in interim; GB drain injection can be done in 4 weeks post placement; other plans as per CCS/IM/ID     Electronically Signed: D. Rowe Robert, PA-C 01/02/2018, 4:26 PM   I spent a total of 15 minutes at the the patient's bedside AND on the patient's hospital floor or unit, greater than 50% of which was counseling/coordinating care for perihepatic/gallbladder drains      Patient ID: Preston Moore, male   DOB: 03/12/1934, 84 y.o.   MRN: [id number]

## 2018-01-02 NOTE — Progress Notes (Signed)
PROGRESS NOTE [facility]   Preston Moore   ZOX:[id number] DOB: 21-Jan-1934  DOA: 12/25/2017 PCP: Gildardo Cranker, DO   Brief Narrative:  Preston Moore is 84 year old male with significant medical history of systolic heart failure with EF of 35%, bedbound, type 2 diabetes mellitus, A. fib and hypertension presented to the emergency department with tachycardia.  He is a nursing home resident was noted to have significant tachycardia, fever and hypotension and was sent to the ED for further evaluation.  On initial exam patient was hemodynamically stable.  Abdominal CT with several low-attenuation foci within the medial segment of the left lobe of the, continues to the gallbladder suspicion for small liver abscess liver subdiaphragmatic fluid collection was identified.  Chest x-ray showed hyperinflated lungs.  An EKG with A. fib with RVR.  She was admitted with working diagnosis of sepsis due to liver abscess, acalculous cholecystitis with complicated acute renal failure and A. fib with RVR.  Patient underwent percutaneous cholecystectomy and perihepatic fluid drainage on 12/26/17 by IR.  Fluid collection reveal ESBL, ID was consulted and patient was started on Merrem.  Subjective: Patient seen and examined today, his breathing feels better and denies any chest pain.  Mentation continues to improve.  Although continues to have some difficulty with swallowing.   Assessment & Plan: Sepsis due to acalculous cholecystitis and liver abscess/enterococcus and ESBL E. coli Sepsis physiology has resolved, status post percutaneous cholecystectomy with JP drain of liver abscess. ID has been consulted and recommended IV imipenem.  PICC line has been placed for long-term antibiotics.  WBC slight elevated, patient remains afebrile.  Repeat CT of the abdomen shows some collections that are improving and some collection or increasing, case discussed with IR did with evaluate and make further recommendations.   Continue Merrem for now.  A. fib with RVR Heart rate now well controlled, patient on metoprolol 25 mg twice daily Not on anticoagulation  Acute on chronic systolic heart failure with EF of 30-35% Secondary to aggressive hydration due to infectious process Received a dose of Lasix with improvement of his respiratory symptoms Continue with good urine output We will continue Lasix 20 mg IV twice daily, check daily weights and strict INO's  Acute respiratory failure with hypoxia secondary to fluid overload. Treat as above Wean O2 as tolerated  AKI - resolved Creatinine at baseline Continue monitor closely  Oral candidiasis In setting of patient with type 2 diabetes mellitus IV fluconazole 200 mg for 7 days  Type 2 diabetes mellitus CBGs labile Lantus 25 units Continue SSI  Hypertension BP stable on metoprolol 25 mg twice daily Continue to monitor  Dysphagia Likely due to deconditioning and oral thrush questionable if esophageal candidiasis as well. Patient on fluconazole Will order modified barium swallow  DVT prophylaxis: SCDs Code Status: DNR Family Communication: Daughter at bedside Disposition Plan: Pending clinical improvement and resolution of abscess SNF in 2-3 days   Consultants:   IR  General surgery  ID  Procedures:     Antimicrobials: Anti-infectives (From admission, onward)   Start     Dose/Rate Route Frequency Ordered Stop   01/01/18 1300  fluconazole (DIFLUCAN) IVPB 200 mg     200 mg 100 mL/hr over 60 Minutes Intravenous Every 24 hours 01/01/18 1235 01/08/18 1259   12/30/17 1230  imipenem-cilastatin (PRIMAXIN) 500 mg in sodium chloride 0.9 % 100 mL IVPB     500 mg 200 mL/hr over 30 Minutes Intravenous Every 6 hours 12/30/17 1132  12/28/17 1400  piperacillin-tazobactam (ZOSYN) IVPB 3.375 g  Status:  Discontinued     3.375 g 12.5 mL/hr over 240 Minutes Intravenous Every 8 hours 12/28/17 0851 12/30/17 1127   12/28/17 1000   ciprofloxacin (CIPRO) IVPB 200 mg  Status:  Discontinued     200 mg 100 mL/hr over 60 Minutes Intravenous Every 12 hours 12/28/17 0833 12/28/17 0848   12/27/17 2200  meropenem (MERREM) 1 g in sodium chloride 0.9 % 100 mL IVPB  Status:  Discontinued     1 g 200 mL/hr over 30 Minutes Intravenous Every 8 hours 12/27/17 1208 12/28/17 0833   12/25/17 2200  linezolid (ZYVOX) IVPB 600 mg  Status:  Discontinued     600 mg 300 mL/hr over 60 Minutes Intravenous Every 12 hours 12/25/17 1133 12/28/17 0833   12/25/17 2200  meropenem (MERREM) 1 g in sodium chloride 0.9 % 100 mL IVPB  Status:  Discontinued     1 g 200 mL/hr over 30 Minutes Intravenous Every 12 hours 12/25/17 1133 12/27/17 1208   12/25/17 1115  linezolid (ZYVOX) IVPB 600 mg     600 mg 300 mL/hr over 60 Minutes Intravenous STAT 12/25/17 1108 12/25/17 1346   12/25/17 1115  meropenem (MERREM) 1 g in sodium chloride 0.9 % 100 mL IVPB     1 g 200 mL/hr over 30 Minutes Intravenous STAT 12/25/17 1108 12/25/17 1244         Objective: Vitals:   01/01/18 1244 01/01/18 2123 01/02/18 0500 01/02/18 0549  BP: (!) 107/55 137/67  118/68  Pulse: 93 99  62  Resp:  20  20  Temp: 99.6 F (37.6 C) 98.3 F (36.8 C)  98 F (36.7 C)  TempSrc: Oral Oral  Oral  SpO2: 95% 93%  93%  Weight:   93.4 kg (205 lb 14.6 oz)   Height:        Intake/Output Summary (Last 24 hours) at 01/02/2018 1514 Last data filed at 01/02/2018 1339 Gross per 24 hour  Intake 610 ml  Output 1670 ml  Net -1060 ml   Filed Weights   12/31/17 0621 01/01/18 0500 01/02/18 0500  Weight: 96.6 kg (212 lb 15.4 oz) 98 kg (216 lb 0.8 oz) 93.4 kg (205 lb 14.6 oz)    Examination:  General exam: Appears calm and comfortable  HEENT: OP moist and clear Respiratory system: Decreased air entry bilaterally, bibasilar crackles Cardiovascular system: S1 & S2 heard, Irr Irr. No JVD, murmurs, rubs or gallops Gastrointestinal system: Abdomen is nondistended, soft and nontender.  JP drain  with low output, cholecystectomy draining with green bile Central nervous system: Awake with slow response. Extremities: Bilateral lower extremity edema 1+ Skin: No rashes  Data Reviewed: I have personally reviewed following labs and imaging studies  CBC: Recent Labs  Lab 12/28/17 0339 12/29/17 0437 12/30/17 0640 01/01/18 0352 01/02/18 0858  WBC 15.9* 13.5* 11.6* 15.3* 14.2*  NEUTROABS 13.3* 10.3* 8.1* 12.0* 11.1*  HGB 11.6* 11.4* 11.2* 10.7* 10.7*  HCT 35.3* 36.0* 35.1* 33.9* 33.4*  MCV 86.9 88.0 86.9 88.5 87.2  PLT 353 409* 387 432* 086   Basic Metabolic Panel: Recent Labs  Lab 12/27/17 1115 12/28/17 0339 12/29/17 0437 12/30/17 0640 01/01/18 0352 01/02/18 0500  NA 138 136 138 135 135 132*  K 4.0 3.5 4.0 4.1 3.9 5.1  CL 106 104 104 101 100* 98*  CO2 23 23 26 24 24 24   GLUCOSE 274* 136* 168* 176* 172* 185*  BUN 30* 23* 20 20 30*  36*  CREATININE 1.28* 1.13 1.00 1.23 1.10 1.12  CALCIUM 8.1* 7.9* 8.0* 7.9* 8.0* 7.8*  MG 2.0 2.0  --   --   --  1.8  PHOS 2.2*  --   --   --   --   --    GFR: Estimated Creatinine Clearance: 60.4 mL/min (by C-G formula based on SCr of 1.12 mg/dL). Liver Function Tests: No results for input(s): AST, ALT, ALKPHOS, BILITOT, PROT, ALBUMIN in the last 168 hours. No results for input(s): LIPASE, AMYLASE in the last 168 hours. No results for input(s): AMMONIA in the last 168 hours. Coagulation Profile: No results for input(s): INR, PROTIME in the last 168 hours. Cardiac Enzymes: No results for input(s): CKTOTAL, CKMB, CKMBINDEX, TROPONINI in the last 168 hours. BNP (last 3 results) No results for input(s): PROBNP in the last 8760 hours. HbA1C: No results for input(s): HGBA1C in the last 72 hours. CBG: Recent Labs  Lab 01/01/18 1209 01/01/18 1723 01/01/18 2119 01/02/18 0735 01/02/18 1138  GLUCAP 261* 226* 224* 163* 243*   Lipid Profile: No results for input(s): CHOL, HDL, LDLCALC, TRIG, CHOLHDL, LDLDIRECT in the last 72  hours. Thyroid Function Tests: No results for input(s): TSH, T4TOTAL, FREET4, T3FREE, THYROIDAB in the last 72 hours. Anemia Panel: No results for input(s): VITAMINB12, FOLATE, FERRITIN, TIBC, IRON, RETICCTPCT in the last 72 hours. Sepsis Labs: No results for input(s): PROCALCITON, LATICACIDVEN in the last 168 hours.  Recent Results (from the past 240 hour(s))  Culture, blood (Routine x 2)     Status: None   Collection Time: 12/25/17 10:18 AM  Result Value Ref Range Status   Specimen Description   Final    BLOOD LEFT WRIST Performed at [facility] [address], [place], [place] [address]    Special Requests   Final    IN PEDIATRIC BOTTLE Blood Culture adequate volume Performed at [facility] [address] [place], [place] [address]    Culture   Final    NO GROWTH 5 DAYS Performed at [facility], [place] [address], [place], [place] [address]    Report Status 12/30/2017 FINAL  Final  Culture, blood (Routine x 2)     Status: Abnormal   Collection Time: 12/25/17 10:26 AM  Result Value Ref Range Status   Specimen Description   Final    BLOOD RIGHT HAND Performed at [facility] [address], [place], [place] [address]    Special Requests   Final    IN PEDIATRIC BOTTLE Blood Culture adequate volume Performed at [facility] [address], [place], [place] [address]    Culture  Setup Time   Final    GRAM POSITIVE COCCI IN PEDIATRIC BOTTLE CRITICAL RESULT CALLED TO, READ BACK BY AND VERIFIED WITH: J GRIMSLEY [facility] 12/26/17 0640 JDW    Culture (A)  Final    STAPHYLOCOCCUS SPECIES (COAGULASE NEGATIVE) THE SIGNIFICANCE OF ISOLATING THIS ORGANISM FROM A SINGLE SET OF BLOOD CULTURES WHEN MULTIPLE SETS ARE DRAWN IS UNCERTAIN. PLEASE NOTIFY THE MICROBIOLOGY DEPARTMENT WITHIN ONE WEEK IF SPECIATION AND SENSITIVITIES ARE REQUIRED. Performed at [facility], [place] [address] [place],   [address]    Report Status 12/27/2017 FINAL  Final  Blood Culture ID Panel (Reflexed)     Status: Abnormal   Collection Time: 12/25/17 10:26 AM  Result Value Ref Range Status   Enterococcus species NOT DETECTED NOT DETECTED Final   Listeria monocytogenes NOT  DETECTED NOT DETECTED Final   Staphylococcus species DETECTED (A) NOT DETECTED Final    Comment: Methicillin (oxacillin) resistant coagulase negative staphylococcus. Possible blood culture contaminant (unless isolated from more than one blood culture draw or clinical case suggests pathogenicity). No antibiotic treatment is indicated for blood  culture contaminants. CRITICAL RESULT CALLED TO, READ BACK BY AND VERIFIED WITH: J GRIMSLEY [facility] 12/26/17 0640 JDW    Staphylococcus aureus NOT DETECTED NOT DETECTED Final   Methicillin resistance DETECTED (A) NOT DETECTED Final    Comment: CRITICAL RESULT CALLED TO, READ BACK BY AND VERIFIED WITH: J GRIMSLEY PHARMD 12/26/17 0640 JDW    Streptococcus species NOT DETECTED NOT DETECTED Final   Streptococcus agalactiae NOT DETECTED NOT DETECTED Final   Streptococcus pneumoniae NOT DETECTED NOT DETECTED Final   Streptococcus pyogenes NOT DETECTED NOT DETECTED Final   Acinetobacter baumannii NOT DETECTED NOT DETECTED Final   Enterobacteriaceae species NOT DETECTED NOT DETECTED Final   Enterobacter cloacae complex NOT DETECTED NOT DETECTED Final   Escherichia coli NOT DETECTED NOT DETECTED Final   Klebsiella oxytoca NOT DETECTED NOT DETECTED Final   Klebsiella pneumoniae NOT DETECTED NOT DETECTED Final   Proteus species NOT DETECTED NOT DETECTED Final   Serratia marcescens NOT DETECTED NOT DETECTED Final   Haemophilus influenzae NOT DETECTED NOT DETECTED Final   Neisseria meningitidis NOT DETECTED NOT DETECTED Final   Pseudomonas aeruginosa NOT DETECTED NOT DETECTED Final   Candida albicans NOT DETECTED NOT DETECTED Final   Candida glabrata NOT DETECTED NOT DETECTED Final   Candida krusei NOT  DETECTED NOT DETECTED Final   Candida parapsilosis NOT DETECTED NOT DETECTED Final   Candida tropicalis NOT DETECTED NOT DETECTED Final    Comment: Performed at [facility], [place] [address], [place], [place] [address]  Urine culture     Status: None   Collection Time: 12/25/17  3:34 PM  Result Value Ref Range Status   Specimen Description   Final    URINE, RANDOM Performed at [facility] [address], [place], [place] [address]    Special Requests   Final    NONE Performed at [facility], [place] [address] [place], [place] [address]    Culture   Final    NO GROWTH Performed at [facility], [place] [address], [place], [place] [address]    Report Status 12/26/2017 FINAL  Final  MRSA PCR Screening     Status: None   Collection Time: 12/25/17  9:01 PM  Result Value Ref Range Status   MRSA by PCR NEGATIVE NEGATIVE Final    Comment:        The GeneXpert MRSA Assay (FDA approved for NASAL specimens only), is one component of a comprehensive MRSA colonization surveillance program. It is not intended to diagnose MRSA infection nor to guide or monitor treatment for MRSA infections. Performed at [facility], [place] [address], [place], [place] [address]   Aerobic/Anaerobic Culture (surgical/deep wound)     Status: None (Preliminary result)   Collection Time: 12/26/17  4:46 PM  Result Value Ref Range Status   Specimen Description   Final    ABSCESS PERIHEPATIC Performed at [facility] [address], [place],  [address]    Special Requests NONE SAMPLE B  Final   Gram Stain   Final    ABUNDANT WBC PRESENT, PREDOMINANTLY PMN RARE GRAM POSITIVE COCCI    Culture   Final    RARE ENTEROCOCCUS  FAECALIS RARE ESCHERICHIA COLI MODERATE BACTEROIDES FRAGILIS BETA LACTAMASE POSITIVE Confirmed Extended Spectrum Beta-Lactamase Producer (ESBL).  In bloodstream infections from ESBL organisms,  carbapenems are preferred over piperacillin/tazobactam. They are shown to have a lower risk of mortality. Performed at [facility], [place] [address], [place], [place] [address]    Report Status PENDING  Incomplete   Organism ID, Bacteria ENTEROCOCCUS FAECALIS  Final   Organism ID, Bacteria ESCHERICHIA COLI  Final      Susceptibility   Escherichia coli - MIC*    AMPICILLIN >=32 RESISTANT Resistant     CEFAZOLIN >=64 RESISTANT Resistant     CEFEPIME RESISTANT Resistant     CEFTAZIDIME RESISTANT Resistant     CEFTRIAXONE RESISTANT Resistant     CIPROFLOXACIN 1 SENSITIVE Sensitive     GENTAMICIN <=1 SENSITIVE Sensitive     IMIPENEM <=0.25 SENSITIVE Sensitive     TRIMETH/SULFA >=320 RESISTANT Resistant     AMPICILLIN/SULBACTAM 16 INTERMEDIATE Intermediate     PIP/TAZO <=4 SENSITIVE Sensitive     Extended ESBL POSITIVE Resistant     * RARE ESCHERICHIA COLI   Enterococcus faecalis - MIC*    AMPICILLIN <=2 SENSITIVE Sensitive     VANCOMYCIN 1 SENSITIVE Sensitive     GENTAMICIN SYNERGY SENSITIVE Sensitive     * RARE ENTEROCOCCUS FAECALIS  Aerobic/Anaerobic Culture (surgical/deep wound)     Status: None   Collection Time: 12/26/17  4:46 PM  Result Value Ref Range Status   Specimen Description GALL BLADDER  Final   Special Requests  SAMPLE A  Final   Gram Stain   Final    ABUNDANT WBC PRESENT, PREDOMINANTLY PMN FEW GRAM POSITIVE COCCI FEW GRAM NEGATIVE RODS    Culture   Final    ABUNDANT ENTEROCOCCUS FAECALIS ABUNDANT ESCHERICHIA COLI Confirmed Extended Spectrum Beta-Lactamase Producer (ESBL).  In bloodstream infections from ESBL organisms, carbapenems are preferred over piperacillin/tazobactam. They are shown to have a lower risk of mortality. ABUNDANT BACTEROIDES FRAGILIS BETA LACTAMASE POSITIVE Performed at [facility], [place] [address], [place], [place] [address]    Report Status 12/31/2017 FINAL  Final   Organism ID, Bacteria ENTEROCOCCUS FAECALIS  Final   Organism  ID, Bacteria ESCHERICHIA COLI  Final      Susceptibility   Escherichia coli - MIC*    AMPICILLIN >=32 RESISTANT Resistant     CEFAZOLIN >=64 RESISTANT Resistant     CEFEPIME RESISTANT Resistant     CEFTAZIDIME RESISTANT Resistant     CEFTRIAXONE RESISTANT Resistant     CIPROFLOXACIN 1 SENSITIVE Sensitive     GENTAMICIN <=1 SENSITIVE Sensitive     IMIPENEM <=0.25 SENSITIVE Sensitive     TRIMETH/SULFA >=320 RESISTANT Resistant     AMPICILLIN/SULBACTAM >=32 RESISTANT Resistant     PIP/TAZO <=4 SENSITIVE Sensitive     Extended ESBL POSITIVE Resistant     * ABUNDANT ESCHERICHIA COLI   Enterococcus faecalis - MIC*    AMPICILLIN <=2 SENSITIVE Sensitive     VANCOMYCIN 1 SENSITIVE Sensitive     GENTAMICIN SYNERGY SENSITIVE Sensitive     * ABUNDANT ENTEROCOCCUS FAECALIS  Culture, blood (routine x 2)     Status: None (Preliminary result)   Collection Time: 12/28/17  9:53 AM  Result Value Ref Range Status   Specimen Description   Final    BLOOD RIGHT ANTECUBITAL Performed at [facility] [address], [place], [place] [address]    Special Requests   Final    BOTTLES DRAWN AEROBIC  ONLY Blood Culture adequate volume Performed at [facility] [address] [place], [place] [address]    Culture   Final    NO GROWTH 4 DAYS Performed at [facility], [place] [address] [place], [place] [address]    Report Status PENDING  Incomplete  Culture, blood (routine x 2)     Status: None (Preliminary result)   Collection Time: 12/28/17 10:21 AM  Result Value Ref Range Status   Specimen Description   Final    BLOOD RIGHT HAND Performed at [facility] [address], [place], [place] [address]    Special Requests   Final    BOTTLES DRAWN AEROBIC ONLY Blood Culture adequate volume Performed at [facility] [address] [place], [place] [address]    Culture   Final    NO GROWTH 4 DAYS Performed at [facility], [place] [address] [place], [place] [address]    Report Status PENDING  Incomplete      Radiology Studies: Ct Abdomen Pelvis W Contrast  Result Date: 01/01/2018 CLINICAL DATA:  84 year old male with acalculous cholecystitis and liver abscess post drainage. Subsequent encounter. EXAM: CT ABDOMEN AND PELVIS WITH CONTRAST TECHNIQUE: Multidetector CT imaging of the abdomen and pelvis was performed using the standard protocol following bolus administration of intravenous contrast. CONTRAST:  149mL ISOVUE-300 IOPAMIDOL (ISOVUE-300) INJECTION 61% COMPARISON:  12/26/2017 drainage films.  12/25/2017 pre drainage CT. FINDINGS: Lower chest: Interval increase in size of right-sided pleural effusion (now moderate in size) which may be loculated. Small left-sided pleural effusion. Bibasilar consolidation may represent atelectasis or infiltrate. Mild cardiomegaly. Coronary artery calcifications. Mitral and aortic valve calcifications. Hepatobiliary: Cholecystostomy tube has been place with decompression of gallbladder. Decrease in size of small abscesses within the liver adjacent to the gallbladder fossa largest measuring up to 1 cm versus prior 1.5 cm. Fluid collection along the anterior margin of the liver of similar size to slightly larger measuring 3.1 x 1.2 x 3.8 cm versus prior 3.1 x 1.4 by 3.8 cm. Pancreas: No pancreatic mass or primary pancreatic inflammation. Spleen: No splenic mass or enlargement. Adrenals/Urinary Tract: No obstructing stone or hydronephrosis. No worrisome renal or adrenal lesion. Noncontrast filled imaging of urinary bladder without gross abnormality although evaluation limited. Stomach/Bowel: No primary bowel inflammatory process. Vascular/Lymphatic: Atherosclerotic changes of tortuous aorta. Atherosclerotic changes aortic branch vessels. No abdominal aortic aneurysm or large vessel occlusion. Prominent narrowing origin superior mesenteric artery and right renal artery. No adenopathy.  Reproductive: Prostate gland top-normal size. Other: Drain placed within the anterior right subphrenic abscess which has decreased significantly in size with a residual thickness of abscess measuring 1.4 cm versus prior 2.2 cm. Interval increase in size of left subphrenic collection now with maximal thickness of 1.6 cm versus prior 0.8 cm. Musculoskeletal: L1 superior endplate compression fracture once again noted and without change from recent exam. Degenerative changes L3-4 through L5-S1. Bilateral hip surgery with degenerative changes. IMPRESSION: Cholecystostomy tube has been place with decompression of gallbladder. Decrease in size of small abscesses within the liver adjacent to the gallbladder fossa largest measuring up to 1 cm versus prior 1.5 cm. Fluid collection along the anterior margin of the liver of similar size to slightly larger measuring 3.1 x 1.2 x 3.8 cm versus prior 3.1 x 1.4 by 3.8 cm. Drain placed within the anterior right subphrenic abscess which has decreased significantly in size with a residual thickness of abscess measuring 1.4 cm versus prior 2.2  cm. Interval increase in size of left subphrenic collection now with maximal thickness of 1.6 cm versus prior 0.8 cm. Interval increase in size of right-sided pleural effusion (now moderate in size) and may be loculated. Small left-sided pleural effusion. Bibasilar consolidation may represent atelectasis or infiltrate. Aortic Atherosclerosis (ICD10-I70.0). Prominent narrowing origin superior mesenteric artery and right renal artery. Coronary artery calcification. L1 superior endplate compression fracture similar to recent CT. Electronically Signed   By: Genia Del M.D.   On: 01/01/2018 17:44   Dg Chest Port 1 View  Result Date: 01/01/2018 CLINICAL DATA:  Follow-up pneumonia, short of breath EXAM: PORTABLE CHEST 1 VIEW COMPARISON:  12/26/2017, 12/25/2017, 11/14/2017 FINDINGS: Left upper extremity catheter tip overlies the proximal right  atrium. Development of small right-sided pleural effusion and probable small left effusion. Cardiomegaly with vascular congestion and mild pulmonary edema. Atherosclerosis of the aorta. No pneumothorax. Drainage catheters in the right upper quadrant. IMPRESSION: 1. Left upper extremity catheter tip overlies the proximal right atrium 2. Development of small moderate right pleural effusion, likely layering and attributing to asymmetric hazy opacity in the right thorax 3. Cardiomegaly with vascular congestion and mild pulmonary edema. Probable small left effusion. Electronically Signed   By: Donavan Foil M.D.   On: 01/01/2018 18:27   [place] Ekg Site Rite  Result Date: 12/31/2017 If Site Rite image not attached, placement could not be confirmed due to current cardiac rhythm.     Scheduled Meds: . baclofen  5 mg Oral TID  . feeding supplement (GLUCERNA SHAKE)  237 mL Oral TID BM  . Gerhardt's butt cream   Topical QID  . insulin aspart  0-20 Units Subcutaneous TID AC & HS  . insulin glargine  15 Units Subcutaneous QHS  . lactobacillus  1 g Oral Q breakfast  . metoprolol tartrate  25 mg Oral BID  . nutrition supplement (JUVEN)  1 packet Oral BID WC  . OLANZapine  5 mg Oral QHS  . sodium chloride flush  5 mL Intracatheter Q12H   Continuous Infusions: . sodium chloride    . fluconazole (DIFLUCAN) IV 200 mg (01/02/18 1306)  . imipenem-cilastatin Stopped (01/02/18 1237)     LOS: 8 days    Time spent: Total of 25 minutes spent with pt, greater than 50% of which was spent in discussion of  treatment, counseling and coordination of care  Chipper Oman, MD Pager: Text Page via [email or web address]   If 7PM-7AM, please contact night-coverage [email or web address] 01/02/2018, 3:14 PM   Note - This record has been created using [employer]. Chart creation errors have been sought, but may not always have been located. Such creation errors do not reflect on the standard of medical care.
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
## 2018-01-02 NOTE — Progress Notes (Signed)
After 2nd dose of TPA and pulling out 1cm, still no blood return from PICC line. The line was repositioned and flushed with no blood return noted. Abby,RN aware and recommended from IV team to exchange the PICC or insert a new one.   Lerry Liner, RN, VAST

## 2018-01-02 NOTE — Progress Notes (Signed)
Pharmacy Antibiotic Note  Preston Moore is a 84 y.o. male admitted on 12/25/2017 with intra-abdominal infection with ESBL e. Coli and enterococcus.  Pharmacy has been consulted for imipenem dosing. Patient with cholecystitis/liver abscess. IR placed percutaneous drain with subsequent cultures growing ESBL e. Coli and enterococcuss.  Attempted to contact [facility] 3/16 (X3) but no response.  Discussed case with ID today and recommended change to imipenem (use over meropenem for better enterococcus coverage). Although ESBL organism susceptible to zosyn (since 3/15, previously on meropenem), preference is to use carbapenem  Plan: Based on current renal fx, Continue imipenem 500mg  IV q6h  - monitor renal function  Height: 5\' 11"  (180.3 cm) Weight: 205 lb 14.6 oz (93.4 kg) IBW/kg (Calculated) : 75.3  Temp (24hrs), Avg:98.2 F (36.8 C), Min:98 F (36.7 C), Max:98.3 F (36.8 C)  Recent Labs  Lab 12/28/17 0339 12/29/17 0437 12/30/17 0640 01/01/18 0352 01/02/18 0500 01/02/18 0858  WBC 15.9* 13.5* 11.6* 15.3*  --  14.2*  CREATININE 1.13 1.00 1.23 1.10 1.12  --     Estimated Creatinine Clearance: 60.4 mL/min (by C-G formula based on SCr of 1.12 mg/dL).    No Known Allergies  Antimicrobials this admission: 3/12 Linezolid>>3/15 3/12 Meropenem>>3/15 3/15 Zosyn >> 3/17 3/17 imipenem >>  Dose adjustments this admission: 3/14 meropenem 1gm q12h to 1gm q8h  Microbiology results: 3/12 BCx: 1/2 CoNS 3/12 UCx: ngF 3/12 MRSa PCR: negative 3/13 perihepatic abscess: abundant WBC, rare GPC, NGTD 3/13 GB bile: abundant WBC, few GPC, few GNR - ESBL Ecoli and entercoccus faecalis 3/15: BCx: NGTD  Thank you for allowing pharmacy to be a part of this patient's care.   Royetta Asal, PharmD, BCPS Pager [phone] 01/02/2018 1:04 PM

## 2018-01-03 ENCOUNTER — Inpatient Hospital Stay (HOSPITAL_COMMUNITY): Payer: Medicare Other

## 2018-01-03 DIAGNOSIS — Z95828 Presence of other vascular implants and grafts: Secondary | ICD-10-CM

## 2018-01-03 DIAGNOSIS — K8010 Calculus of gallbladder with chronic cholecystitis without obstruction: Secondary | ICD-10-CM

## 2018-01-03 LAB — BASIC METABOLIC PANEL
Anion gap: 10 (ref 5–15)
BUN: 38 mg/dL — AB (ref 6–20)
CALCIUM: 8.3 mg/dL — AB (ref 8.9–10.3)
CO2: 26 mmol/L (ref 22–32)
CREATININE: 1.18 mg/dL (ref 0.61–1.24)
Chloride: 98 mmol/L — ABNORMAL LOW (ref 101–111)
GFR calc Af Amer: >60 mL/min (ref >60)
GFR, EST NON AFRICAN AMERICAN: 56 mL/min — AB (ref >60)
GLUCOSE: 217 mg/dL — AB (ref 65–99)
Potassium: 4.1 mmol/L (ref 3.5–5.1)
Sodium: 134 mmol/L — ABNORMAL LOW (ref 135–145)

## 2018-01-03 LAB — AEROBIC/ANAEROBIC CULTURE W GRAM STAIN (SURGICAL/DEEP WOUND)

## 2018-01-03 LAB — CBC WITH DIFFERENTIAL/PLATELET
BASOS PCT: 0 %
Basophils Absolute: 0 10*3/uL (ref 0.0–0.1)
EOS ABS: 0.1 10*3/uL (ref 0.0–0.7)
EOS PCT: 1 %
HCT: 33.1 % — ABNORMAL LOW (ref 39.0–52.0)
Hemoglobin: 10.6 g/dL — ABNORMAL LOW (ref 13.0–17.0)
LYMPHS PCT: 18 %
Lymphs Abs: 2.6 10*3/uL (ref 0.7–4.0)
MCH: 27.8 pg (ref 26.0–34.0)
MCHC: 32 g/dL (ref 30.0–36.0)
MCV: 86.9 fL (ref 78.0–100.0)
MONO ABS: 0.9 10*3/uL (ref 0.1–1.0)
Monocytes Relative: 6 %
Neutro Abs: 10.4 10*3/uL — ABNORMAL HIGH (ref 1.7–7.7)
Neutrophils Relative %: 75 %
PLATELETS: 459 10*3/uL — AB (ref 150–400)
RBC: 3.81 MIL/uL — ABNORMAL LOW (ref 4.22–5.81)
RDW: 15.1 % (ref 11.5–15.5)
WBC: 14 10*3/uL — ABNORMAL HIGH (ref 4.0–10.5)

## 2018-01-03 LAB — AEROBIC/ANAEROBIC CULTURE (SURGICAL/DEEP WOUND)

## 2018-01-03 LAB — GLUCOSE, CAPILLARY
GLUCOSE-CAPILLARY: 133 mg/dL — AB (ref 65–99)
Glucose-Capillary: 206 mg/dL — ABNORMAL HIGH (ref 65–99)
Glucose-Capillary: 203 mg/dL — ABNORMAL HIGH (ref 65–99)
Glucose-Capillary: 121 mg/dL — ABNORMAL HIGH (ref 65–99)

## 2018-01-03 MED ORDER — SODIUM CHLORIDE 0.9 % IV SOLN
500.0000 mg | Freq: Four times a day (QID) | INTRAVENOUS | Status: DC
  Administered 2018-01-03 – 2018-01-04 (×4): 500 mg via INTRAVENOUS
  Filled 2018-01-03 (×7): qty 500
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
## 2018-01-03 NOTE — Progress Notes (Signed)
I agree with student nurses day assessment.

## 2018-01-03 NOTE — Progress Notes (Signed)
PROGRESS NOTE [facility]   Preston Moore   WNU:[id number] DOB: [birth year]  DOA: 12/25/2017 PCP: Gildardo Cranker, DO   Brief Narrative:  Preston Moore is 84 year old male with significant medical history of systolic heart failure with EF of 35%, bedbound, type 2 diabetes mellitus, A. fib and hypertension presented to the emergency department with tachycardia.  He is a nursing home resident was noted to have significant tachycardia, fever and hypotension and was sent to the ED for further evaluation.  On initial exam patient was hemodynamically stable.  Abdominal CT with several low-attenuation foci within the medial segment of the left lobe of the, continues to the gallbladder suspicion for small liver abscess liver subdiaphragmatic fluid collection was identified.  Chest x-ray showed hyperinflated lungs.  An EKG with A. fib with RVR. He was admitted with working diagnosis of sepsis due to liver abscess, acalculous cholecystitis with complicated acute renal failure and A. fib with RVR.  Patient underwent percutaneous cholecystectomy and perihepatic fluid drainage on 12/26/17 by IR.  Fluid collection reveal ESBL, ID was consulted and patient was started on Merrem.  Patient has continued to decline despite medical therapy, became fluid overload and now receiving Lasix.  Also severe deconditioning with malnutrition.  MBS shows severe risk of aspiration with severe dysphagia.  Subjective: Patient seen and examined, he continues to be ill looking.  Today more confused than yesterday per daughter.  Report breathing is okay although nasal cannula with increasing oxygen requirement.  Denies chest pain.  Long discussion with daughters regarding goals of care see below  Assessment & Plan: Sepsis due to acalculous cholecystitis and liver abscess/enterococcus and ESBL E. coli Sepsis physiology has resolved, status post percutaneous cholecystectomy with JP drain of liver abscess. ID has been consulted and  recommended IV imipenem for at least 3 weeks.  PICC line has been placed for long-term antibiotics.  WBC stable.  Repeat CT scan from 3/20 discussed with IR, they do not see any fluid collection that needs to be drained at this moment and does not recommend any other interventions.  I have discussed findings with surgical team which also is not recommending any surgical interventions.  For now we will continue antibiotic treatment and repeat CT scan upon completion of antibiotics.  A. fib with RVR Heart rate now well controlled, patient on metoprolol 25 mg twice daily Not on anticoagulation  Acute on chronic systolic heart failure with EF of 30-35% Secondary to aggressive hydration due to infectious process On Lasix with good diuresis, renal function stable.  Continue current regimen  Acute respiratory failure with hypoxia secondary to fluid overload. Treat as above Wean O2 as tolerated  AKI - resolved Creatinine at baseline Continue monitor closely  Oral candidiasis In setting of patient with type 2 diabetes mellitus IV fluconazole 200 mg for 7 days  Type 2 diabetes mellitus CBGs acceptable Continue Lantus 25 units daily Continue SSI  Hypertension BP stable on metoprolol 25 mg twice daily Continue to monitor  Dysphagia Likely due to deconditioning and oral thrush questionable if esophageal candidiasis as well. Patient on fluconazole Modified barium swallow shows severe dysphagia and high risk of aspiration likely from deconditioning.  SLP has recommended to keep patient n.p.o.  Goal of care Have a long discussion with 2 daughters at bedside regarding condition and prognosis of patient.  Main question remaining is what approach  they would like to pursue.  I have  explained the patient will continue to deteriorate and now the patient has  dysphagia and will no have any oral intake to provide nutrition his prognosis has worsened.  Patient has not responded well to therapy  and now with heart failure  chances of meaningful recovery are very low.  Daughters understand and would like to think about possible palliative versus hospice care.  I have discussed this with palliative team patient may be a candidate for comfort measures as well.  DVT prophylaxis: SCDs Code Status: DNR Family Communication: Daughter at bedside Disposition Plan: Pending clinical improvement and resolution of abscess SNF in 2-3 days  Consultants:   IR  General surgery  ID  Procedures:     Antimicrobials: Anti-infectives (From admission, onward)   Start     Dose/Rate Route Frequency Ordered Stop   01/01/18 1300  fluconazole (DIFLUCAN) IVPB 200 mg     200 mg 100 mL/hr over 60 Minutes Intravenous Every 24 hours 01/01/18 1235 01/08/18 1259   12/30/17 1230  imipenem-cilastatin (PRIMAXIN) 500 mg in sodium chloride 0.9 % 100 mL IVPB     500 mg 200 mL/hr over 30 Minutes Intravenous Every 6 hours 12/30/17 1132     12/28/17 1400  piperacillin-tazobactam (ZOSYN) IVPB 3.375 g  Status:  Discontinued     3.375 g 12.5 mL/hr over 240 Minutes Intravenous Every 8 hours 12/28/17 0851 12/30/17 1127   12/28/17 1000  ciprofloxacin (CIPRO) IVPB 200 mg  Status:  Discontinued     200 mg 100 mL/hr over 60 Minutes Intravenous Every 12 hours 12/28/17 0833 12/28/17 0848   12/27/17 2200  meropenem (MERREM) 1 g in sodium chloride 0.9 % 100 mL IVPB  Status:  Discontinued     1 g 200 mL/hr over 30 Minutes Intravenous Every 8 hours 12/27/17 1208 12/28/17 0833   12/25/17 2200  linezolid (ZYVOX) IVPB 600 mg  Status:  Discontinued     600 mg 300 mL/hr over 60 Minutes Intravenous Every 12 hours 12/25/17 1133 12/28/17 0833   12/25/17 2200  meropenem (MERREM) 1 g in sodium chloride 0.9 % 100 mL IVPB  Status:  Discontinued     1 g 200 mL/hr over 30 Minutes Intravenous Every 12 hours 12/25/17 1133 12/27/17 1208   12/25/17 1115  linezolid (ZYVOX) IVPB 600 mg     600 mg 300 mL/hr over 60 Minutes Intravenous STAT  12/25/17 1108 12/25/17 1346   12/25/17 1115  meropenem (MERREM) 1 g in sodium chloride 0.9 % 100 mL IVPB     1 g 200 mL/hr over 30 Minutes Intravenous STAT 12/25/17 1108 12/25/17 1244        Objective: Vitals:   01/02/18 1500 01/02/18 2135 01/03/18 0439 01/03/18 0900  BP: (!) 130/55 115/62 (!) 107/58 133/62  Pulse: 94 (!) 103 95 (!) 117  Resp: 20 18 16 18   Temp: 99.2 F (37.3 C) 98.3 F (36.8 C) 98 F (36.7 C) 99.5 F (37.5 C)  TempSrc: Axillary Oral Oral Oral  SpO2: 93% 95% 96% 95%  Weight:   99.2 kg (218 lb 11.1 oz)   Height:        Intake/Output Summary (Last 24 hours) at 01/03/2018 1350 Last data filed at 01/03/2018 0900 Gross per 24 hour  Intake 435 ml  Output 1700 ml  Net -1265 ml   Filed Weights   01/01/18 0500 01/02/18 0500 01/03/18 0439  Weight: 98 kg (216 lb 0.8 oz) 93.4 kg (205 lb 14.6 oz) 99.2 kg (218 lb 11.1 oz)    Examination:  General: Awake and alert, somewhat confused Cardiovascular: Irregularly irregular,  I7-T2, systolic murmur Respiratory: Decreased breath sounds bilaterally, bibasilar crackles Abdominal: Soft, JP drain in place minimal output, perc cholecystectomy with green bile Extremities: Bilateral lower extremity trace edema  Data Reviewed: I have personally reviewed following labs and imaging studies  CBC: Recent Labs  Lab 12/29/17 0437 12/30/17 0640 01/01/18 0352 01/02/18 0858 01/03/18 0502  WBC 13.5* 11.6* 15.3* 14.2* 14.0*  NEUTROABS 10.3* 8.1* 12.0* 11.1* 10.4*  HGB 11.4* 11.2* 10.7* 10.7* 10.6*  HCT 36.0* 35.1* 33.9* 33.4* 33.1*  MCV 88.0 86.9 88.5 87.2 86.9  PLT 409* 387 432* 389 458*   Basic Metabolic Panel: Recent Labs  Lab 12/28/17 0339 12/29/17 0437 12/30/17 0640 01/01/18 0352 01/02/18 0500 01/03/18 0502  NA 136 138 135 135 132* 134*  K 3.5 4.0 4.1 3.9 5.1 4.1  CL 104 104 101 100* 98* 98*  CO2 23 26 24 24 24 26   GLUCOSE 136* 168* 176* 172* 185* 217*  BUN 23* 20 20 30* 36* 38*  CREATININE 1.13 1.00 1.23  1.10 1.12 1.18  CALCIUM 7.9* 8.0* 7.9* 8.0* 7.8* 8.3*  MG 2.0  --   --   --  1.8  --    GFR: Estimated Creatinine Clearance: 59 mL/min (by C-G formula based on SCr of 1.18 mg/dL). Liver Function Tests: No results for input(s): AST, ALT, ALKPHOS, BILITOT, PROT, ALBUMIN in the last 168 hours. No results for input(s): LIPASE, AMYLASE in the last 168 hours. No results for input(s): AMMONIA in the last 168 hours. Coagulation Profile: No results for input(s): INR, PROTIME in the last 168 hours. Cardiac Enzymes: No results for input(s): CKTOTAL, CKMB, CKMBINDEX, TROPONINI in the last 168 hours. BNP (last 3 results) No results for input(s): PROBNP in the last 8760 hours. HbA1C: No results for input(s): HGBA1C in the last 72 hours. CBG: Recent Labs  Lab 01/02/18 1138 01/02/18 1627 01/02/18 2137 01/03/18 0752 01/03/18 1132  GLUCAP 243* 192* 227* 206* 203*   Lipid Profile: No results for input(s): CHOL, HDL, LDLCALC, TRIG, CHOLHDL, LDLDIRECT in the last 72 hours. Thyroid Function Tests: No results for input(s): TSH, T4TOTAL, FREET4, T3FREE, THYROIDAB in the last 72 hours. Anemia Panel: No results for input(s): VITAMINB12, FOLATE, FERRITIN, TIBC, IRON, RETICCTPCT in the last 72 hours. Sepsis Labs: No results for input(s): PROCALCITON, LATICACIDVEN in the last 168 hours.  Recent Results (from the past 240 hour(s))  Culture, blood (Routine x 2)     Status: None   Collection Time: 12/25/17 10:18 AM  Result Value Ref Range Status   Specimen Description   Final    BLOOD LEFT WRIST Performed at [facility] [address] [place], [place] [address]    Special Requests   Final    IN PEDIATRIC BOTTLE Blood Culture adequate volume Performed at [facility] [address] [place], [place] [address]    Culture   Final    NO GROWTH 5 DAYS Performed at [facility], [place] [address], [place], [place] [address]    Report Status 12/30/2017 FINAL   Final  Culture, blood (Routine x 2)     Status: Abnormal   Collection Time: 12/25/17 10:26 AM  Result Value Ref Range Status   Specimen Description   Final    BLOOD RIGHT HAND Performed at [facility] [address], [place], [place] [address]    Special Requests   Final    IN PEDIATRIC BOTTLE Blood Culture adequate volume Performed at [facility],  [place] [address], [place], [place] [address]    Culture  Setup Time   Final    GRAM POSITIVE COCCI IN PEDIATRIC BOTTLE CRITICAL RESULT CALLED TO, READ BACK BY AND VERIFIED WITH: J GRIMSLEY [facility] 12/26/17 0640 JDW    Culture (A)  Final    STAPHYLOCOCCUS SPECIES (COAGULASE NEGATIVE) THE SIGNIFICANCE OF ISOLATING THIS ORGANISM FROM A SINGLE SET OF BLOOD CULTURES WHEN MULTIPLE SETS ARE DRAWN IS UNCERTAIN. PLEASE NOTIFY THE MICROBIOLOGY DEPARTMENT WITHIN ONE WEEK IF SPECIATION AND SENSITIVITIES ARE REQUIRED. Performed at [facility], [place] [address] [place], [place] [address]    Report Status 12/27/2017 FINAL  Final  Blood Culture ID Panel (Reflexed)     Status: Abnormal   Collection Time: 12/25/17 10:26 AM  Result Value Ref Range Status   Enterococcus species NOT DETECTED NOT DETECTED Final   Listeria monocytogenes NOT DETECTED NOT DETECTED Final   Staphylococcus species DETECTED (A) NOT DETECTED Final    Comment: Methicillin (oxacillin) resistant coagulase negative staphylococcus. Possible blood culture contaminant (unless isolated from more than one blood culture draw or clinical case suggests pathogenicity). No antibiotic treatment is indicated for blood  culture contaminants. CRITICAL RESULT CALLED TO, READ BACK BY AND VERIFIED WITH: J GRIMSLEY [facility] 12/26/17 0640 JDW    Staphylococcus aureus NOT DETECTED NOT DETECTED Final   Methicillin resistance DETECTED (A) NOT DETECTED Final    Comment: CRITICAL RESULT CALLED TO, READ BACK BY AND VERIFIED WITH: J GRIMSLEY [facility]AR[facility] 12/26/17 0640 JDW     Streptococcus species NOT DETECTED NOT DETECTED Final   Streptococcus agalactiae NOT DETECTED NOT DETECTED Final   Streptococcus pneumoniae NOT DETECTED NOT DETECTED Final   Streptococcus pyogenes NOT DETECTED NOT DETECTED Final   Acinetobacter baumannii NOT DETECTED NOT DETECTED Final   Enterobacteriaceae species NOT DETECTED NOT DETECTED Final   Enterobacter cloacae complex NOT DETECTED NOT DETECTED Final   Escherichia coli NOT DETECTED NOT DETECTED Final   Klebsiella oxytoca NOT DETECTED NOT DETECTED Final   Klebsiella pneumoniae NOT DETECTED NOT DETECTED Final   Proteus species NOT DETECTED NOT DETECTED Final   Serratia marcescens NOT DETECTED NOT DETECTED Final   Haemophilus influenzae NOT DETECTED NOT DETECTED Final   Neisseria meningitidis NOT DETECTED NOT DETECTED Final   Pseudomonas aeruginosa NOT DETECTED NOT DETECTED Final   Candida albicans NOT DETECTED NOT DETECTED Final   Candida glabrata NOT DETECTED NOT DETECTED Final   Candida krusei NOT DETECTED NOT DETECTED Final   Candida parapsilosis NOT DETECTED NOT DETECTED Final   Candida tropicalis NOT DETECTED NOT DETECTED Final    Comment: Performed at [facility], [place] [address], [place], [place] [address]  Urine culture     Status: None   Collection Time: 12/25/17  3:34 PM  Result Value Ref Range Status   Specimen Description   Final    URINE, RANDOM Performed at [facility] [address], [place], [place] [address]    Special Requests   Final    NONE Performed at [facility], [place] [address], [place], [place] [address]    Culture   Final    NO GROWTH Performed at [facility], [place] [address] [place], [place] [address]    Report Status 12/26/2017 FINAL  Final  MRSA PCR Screening     Status: None   Collection Time: 12/25/17  9:01 PM  Result Value Ref Range Status   MRSA by PCR NEGATIVE NEGATIVE Final    Comment:  The GeneXpert MRSA Assay (FDA approved  for NASAL specimens only), is one component of a comprehensive MRSA colonization surveillance program. It is not intended to diagnose MRSA infection nor to guide or monitor treatment for MRSA infections. Performed at [facility], [place] [address], [place], [place] [address]   Aerobic/Anaerobic Culture (surgical/deep wound)     Status: None (Preliminary result)   Collection Time: 12/26/17  4:46 PM  Result Value Ref Range Status   Specimen Description   Final    ABSCESS PERIHEPATIC Performed at [facility] [address], [place], [place] [address]    Special Requests NONE SAMPLE B  Final   Gram Stain   Final    ABUNDANT WBC PRESENT, PREDOMINANTLY PMN RARE GRAM POSITIVE COCCI    Culture   Final    RARE ENTEROCOCCUS FAECALIS RARE ESCHERICHIA COLI MODERATE BACTEROIDES FRAGILIS BETA LACTAMASE POSITIVE Confirmed Extended Spectrum Beta-Lactamase Producer (ESBL).  In bloodstream infections from ESBL organisms, carbapenems are preferred over piperacillin/tazobactam. They are shown to have a lower risk of mortality. Performed at [facility], [place] [address] [place], [place] [address]    Report Status PENDING  Incomplete   Organism ID, Bacteria ENTEROCOCCUS FAECALIS  Final   Organism ID, Bacteria ESCHERICHIA COLI  Final      Susceptibility   Escherichia coli - MIC*    AMPICILLIN >=32 RESISTANT Resistant     CEFAZOLIN >=64 RESISTANT Resistant     CEFEPIME RESISTANT Resistant     CEFTAZIDIME RESISTANT Resistant     CEFTRIAXONE RESISTANT Resistant     CIPROFLOXACIN 1 SENSITIVE Sensitive     GENTAMICIN <=1 SENSITIVE Sensitive     IMIPENEM <=0.25 SENSITIVE Sensitive     TRIMETH/SULFA >=320 RESISTANT Resistant     AMPICILLIN/SULBACTAM 16 INTERMEDIATE Intermediate     PIP/TAZO <=4 SENSITIVE Sensitive     Extended ESBL POSITIVE Resistant     * RARE ESCHERICHIA COLI   Enterococcus faecalis - MIC*    AMPICILLIN <=2 SENSITIVE Sensitive      VANCOMYCIN 1 SENSITIVE Sensitive     GENTAMICIN SYNERGY SENSITIVE Sensitive     * RARE ENTEROCOCCUS FAECALIS  Aerobic/Anaerobic Culture (surgical/deep wound)     Status: None   Collection Time: 12/26/17  4:46 PM  Result Value Ref Range Status   Specimen Description GALL BLADDER  Final   Special Requests  SAMPLE A  Final   Gram Stain   Final    ABUNDANT WBC PRESENT, PREDOMINANTLY PMN FEW GRAM POSITIVE COCCI FEW GRAM NEGATIVE RODS    Culture   Final    ABUNDANT ENTEROCOCCUS FAECALIS ABUNDANT ESCHERICHIA COLI Confirmed Extended Spectrum Beta-Lactamase Producer (ESBL).  In bloodstream infections from ESBL organisms, carbapenems are preferred over piperacillin/tazobactam. They are shown to have a lower risk of mortality. ABUNDANT BACTEROIDES FRAGILIS BETA LACTAMASE POSITIVE Performed at [facility], [place] [address], [place], [place] [address]    Report Status 12/31/2017 FINAL  Final   Organism ID, Bacteria ENTEROCOCCUS FAECALIS  Final   Organism ID, Bacteria ESCHERICHIA COLI  Final      Susceptibility   Escherichia coli - MIC*    AMPICILLIN >=32 RESISTANT Resistant     CEFAZOLIN >=64 RESISTANT Resistant     CEFEPIME RESISTANT Resistant     CEFTAZIDIME RESISTANT Resistant     CEFTRIAXONE RESISTANT Resistant     CIPROFLOXACIN 1 SENSITIVE Sensitive     GENTAMICIN <=1 SENSITIVE Sensitive     IMIPENEM <=0.25 SENSITIVE Sensitive  TRIMETH/SULFA >=320 RESISTANT Resistant     AMPICILLIN/SULBACTAM >=32 RESISTANT Resistant     PIP/TAZO <=4 SENSITIVE Sensitive     Extended ESBL POSITIVE Resistant     * ABUNDANT ESCHERICHIA COLI   Enterococcus faecalis - MIC*    AMPICILLIN <=2 SENSITIVE Sensitive     VANCOMYCIN 1 SENSITIVE Sensitive     GENTAMICIN SYNERGY SENSITIVE Sensitive     * ABUNDANT ENTEROCOCCUS FAECALIS  Culture, blood (routine x 2)     Status: None   Collection Time: 12/28/17  9:53 AM  Result Value Ref Range Status   Specimen Description   Final    BLOOD RIGHT  ANTECUBITAL Performed at [facility] [address], [place], [place] [address]    Special Requests   Final    BOTTLES DRAWN AEROBIC ONLY Blood Culture adequate volume Performed at [facility] [address] [place], [place] [address]    Culture   Final    NO GROWTH 5 DAYS Performed at [facility], [place] [address], [place], [place] [address]    Report Status 01/02/2018 FINAL  Final  Culture, blood (routine x 2)     Status: None   Collection Time: 12/28/17 10:21 AM  Result Value Ref Range Status   Specimen Description   Final    BLOOD RIGHT HAND Performed at [facility] [address] [place], [place] [address]    Special Requests   Final    BOTTLES DRAWN AEROBIC ONLY Blood Culture adequate volume Performed at [facility] [address] [place], [place] [address]    Culture   Final    NO GROWTH 5 DAYS Performed at [facility], [place] [address], [place], [place] [address]    Report Status 01/02/2018 FINAL  Final      Radiology Studies: Ct Abdomen Pelvis W Contrast  Result Date: 01/01/2018 CLINICAL DATA:  84 year old male with acalculous cholecystitis and liver abscess post drainage. Subsequent encounter. EXAM: CT ABDOMEN AND PELVIS WITH CONTRAST TECHNIQUE: Multidetector CT imaging of the abdomen and pelvis was performed using the standard protocol following bolus administration of intravenous contrast. CONTRAST:  121mL ISOVUE-300 IOPAMIDOL (ISOVUE-300) INJECTION 61% COMPARISON:  12/26/2017 drainage films.  12/25/2017 pre drainage CT. FINDINGS: Lower chest: Interval increase in size of right-sided pleural effusion (now moderate in size) which may be loculated. Small left-sided pleural effusion. Bibasilar consolidation may represent atelectasis or infiltrate. Mild cardiomegaly. Coronary artery calcifications. Mitral and aortic valve calcifications. Hepatobiliary: Cholecystostomy tube has  been place with decompression of gallbladder. Decrease in size of small abscesses within the liver adjacent to the gallbladder fossa largest measuring up to 1 cm versus prior 1.5 cm. Fluid collection along the anterior margin of the liver of similar size to slightly larger measuring 3.1 x 1.2 x 3.8 cm versus prior 3.1 x 1.4 by 3.8 cm. Pancreas: No pancreatic mass or primary pancreatic inflammation. Spleen: No splenic mass or enlargement. Adrenals/Urinary Tract: No obstructing stone or hydronephrosis. No worrisome renal or adrenal lesion. Noncontrast filled imaging of urinary bladder without gross abnormality although evaluation limited. Stomach/Bowel: No primary bowel inflammatory process. Vascular/Lymphatic: Atherosclerotic changes of tortuous aorta. Atherosclerotic changes aortic branch vessels. No abdominal aortic aneurysm or large vessel occlusion. Prominent narrowing origin superior mesenteric artery and right renal artery. No adenopathy. Reproductive: Prostate gland top-normal size. Other: Drain placed within the anterior right subphrenic abscess which has decreased significantly in size with a residual thickness of abscess measuring 1.4 cm  versus prior 2.2 cm. Interval increase in size of left subphrenic collection now with maximal thickness of 1.6 cm versus prior 0.8 cm. Musculoskeletal: L1 superior endplate compression fracture once again noted and without change from recent exam. Degenerative changes L3-4 through L5-S1. Bilateral hip surgery with degenerative changes. IMPRESSION: Cholecystostomy tube has been place with decompression of gallbladder. Decrease in size of small abscesses within the liver adjacent to the gallbladder fossa largest measuring up to 1 cm versus prior 1.5 cm. Fluid collection along the anterior margin of the liver of similar size to slightly larger measuring 3.1 x 1.2 x 3.8 cm versus prior 3.1 x 1.4 by 3.8 cm. Drain placed within the anterior right subphrenic abscess which has  decreased significantly in size with a residual thickness of abscess measuring 1.4 cm versus prior 2.2 cm. Interval increase in size of left subphrenic collection now with maximal thickness of 1.6 cm versus prior 0.8 cm. Interval increase in size of right-sided pleural effusion (now moderate in size) and may be loculated. Small left-sided pleural effusion. Bibasilar consolidation may represent atelectasis or infiltrate. Aortic Atherosclerosis (ICD10-I70.0). Prominent narrowing origin superior mesenteric artery and right renal artery. Coronary artery calcification. L1 superior endplate compression fracture similar to recent CT. Electronically Signed   By: Genia Del M.D.   On: 01/01/2018 17:44   Dg Chest Port 1 View  Result Date: 01/01/2018 CLINICAL DATA:  Follow-up pneumonia, short of breath EXAM: PORTABLE CHEST 1 VIEW COMPARISON:  12/26/2017, 12/25/2017, 11/14/2017 FINDINGS: Left upper extremity catheter tip overlies the proximal right atrium. Development of small right-sided pleural effusion and probable small left effusion. Cardiomegaly with vascular congestion and mild pulmonary edema. Atherosclerosis of the aorta. No pneumothorax. Drainage catheters in the right upper quadrant. IMPRESSION: 1. Left upper extremity catheter tip overlies the proximal right atrium 2. Development of small moderate right pleural effusion, likely layering and attributing to asymmetric hazy opacity in the right thorax 3. Cardiomegaly with vascular congestion and mild pulmonary edema. Probable small left effusion. Electronically Signed   By: Donavan Foil M.D.   On: 01/01/2018 18:27   Dg Swallowing Func-speech Pathology  Result Date: 01/03/2018 Objective Swallowing Evaluation: Type of Study: MBS-Modified Barium Swallow Study  Patient Details Name: Preston Moore MRN: [id number] Date of Birth: Apr 13, 1934 Today's Date: 01/03/2018 Time: SLP Start Time (ACUTE ONLY): 1258 -SLP Stop Time (ACUTE ONLY): 1329 SLP Time Calculation (min)  (ACUTE ONLY): 31 min Past Medical History: Past Medical History: Diagnosis Date . Atrial flutter ([place])  . B12 deficiency 02/22/2012 . Closed right hip fracture, initial encounter ([place]) 04/06/2017 . Diabetes mellitus  . Hyperlipidemia  . Hypertension  . Ileus ([place]) 02/29/2012 . Malnutrition ([place]) 02/29/2012 . Neuropathy  . Non compliance with medical treatment 02/29/2012 . Pancreatitis 02/22/2012 . SBO (small bowel obstruction) ([place]) 02/23/2012 . Syncope and collapse  Past Surgical History: Past Surgical History: Procedure Laterality Date . BACK SURGERY   . COMPRESSION HIP SCREW Right 04/07/2017  Procedure: RIGHT HIP INTRATROCHANTERIC NAILING;  Surgeon: [name], MD;  Location: [facility];  Service: Orthopedics;  Laterality: Right; . IR IMAGE GUIDED DRAINAGE PERCUT CATH  PERITONEAL RETROPERIT  12/26/2017 . IR PERC CHOLECYSTOSTOMY  12/26/2017 . LAMINECTOMY  1965 HPI: 84 yo male adm to [facility] hospital - required cholecystotomy drain - PMH + for closed hip fx and has been bed bound since per chart.  Pt found to have pna and has problems swallowing. MD ordered swallow evaluation.  Daughter reports pt fed himself prior to admission but since admitted,  he is weak and does not want to eat/drink much. She states he will take a bite or few sips and then he does not want anymore intake.  Family friend Rosann Auerbach reported pt has not been as interactive over the last month as normal.  Pt noted to have oral candidiasis yesterday and diet modified to full liquids yesterday due to oral holding.   Subjective: pt awake in chair Assessment / Plan / Recommendation CHL IP CLINICAL IMPRESSIONS 01/03/2018 Clinical Impression Delayed oral transiting/control noted with pt lingual rocking  - resulting in pt essentially spilling boluses into pharynx poorly controlled.  In addition, liquid boluses accumulate at pyriform sinus resulting in aspiration of thin liquids during the swallow.  Pt did NOT sense aspiration nor did he cough per verbal/visual cues to  attempt to clear.  Pt is weak and subsequently oropharyngeal residuals are present - pt unable to elicit dry swallows or "hock" to propel base of tongue/vallecular residuals into oral cavity.  Pt is high aspiration and malnutrition due to current level of dysphagia, mentation and weakness.  Educated daughter Pamala Hurry to findings - including concerns for aspiration and malnutrition given lack of functional swallow using teach back and video picture.  Will follow up with family/pt.  Note palliative following but care plan is not full comfort at this time.     SLP Visit Diagnosis Dysphagia, oropharyngeal phase (R13.12) Attention and concentration deficit following -- Frontal lobe and executive function deficit following -- Impact on safety and function Moderate aspiration risk;Severe aspiration risk   CHL IP TREATMENT RECOMMENDATION 01/03/2018 Treatment Recommendations Therapy as outlined in treatment plan below   Prognosis 01/03/2018 Prognosis for Safe Diet Advancement Guarded Barriers to Reach Goals Severity of deficits Barriers/Prognosis Comment -- CHL IP DIET RECOMMENDATION 01/03/2018 SLP Diet Recommendations NPO except meds;Ice chips PRN after oral care;NPO Liquid Administration via -- Medication Administration Whole meds with puree Compensations -- Postural Changes Seated upright at 90 degrees;Remain semi-upright after after feeds/meals (Comment)   CHL IP OTHER RECOMMENDATIONS 01/03/2018 Recommended Consults -- Oral Care Recommendations Oral care QID Other Recommendations --   CHL IP FOLLOW UP RECOMMENDATIONS 01/03/2018 Follow up Recommendations (No Data)   CHL IP FREQUENCY AND DURATION 01/03/2018 Speech Therapy Frequency (ACUTE ONLY) min 1 x/week Treatment Duration 1 week      CHL IP ORAL PHASE 01/03/2018 Oral Phase Impaired Oral - Pudding Teaspoon -- Oral - Pudding Cup -- Oral - Honey Teaspoon -- Oral - Honey Cup -- Oral - Nectar Teaspoon Delayed oral transit;Decreased bolus cohesion;Weak lingual manipulation;Reduced  posterior propulsion;Premature spillage;Holding of bolus;Lingual pumping Oral - Nectar Cup Delayed oral transit;Decreased bolus cohesion;Weak lingual manipulation;Premature spillage;Piecemeal swallowing;Holding of bolus;Lingual pumping Oral - Nectar Straw Delayed oral transit;Decreased bolus cohesion;Weak lingual manipulation;Premature spillage;Piecemeal swallowing;Holding of bolus;Lingual pumping Oral - Thin Teaspoon Weak lingual manipulation;Delayed oral transit;Decreased bolus cohesion;Premature spillage;Piecemeal swallowing;Holding of bolus;Lingual pumping Oral - Thin Cup -- Oral - Thin Straw Weak lingual manipulation;Premature spillage;Decreased bolus cohesion;Reduced posterior propulsion;Holding of bolus;Piecemeal swallowing;Lingual pumping Oral - Puree Decreased bolus cohesion;Lingual pumping;Weak lingual manipulation;Premature spillage;Reduced posterior propulsion;Holding of bolus;Piecemeal swallowing Oral - Mech Soft -- Oral - Regular Decreased bolus cohesion;Holding of bolus;Impaired mastication;Delayed oral transit;Weak lingual manipulation;Lingual pumping;Reduced posterior propulsion;Piecemeal swallowing;Premature spillage Oral - Multi-Consistency -- Oral - Pill -- Oral Phase - Comment --  CHL IP PHARYNGEAL PHASE 01/03/2018 Pharyngeal Phase Impaired Pharyngeal- Pudding Teaspoon -- Pharyngeal -- Pharyngeal- Pudding Cup -- Pharyngeal -- Pharyngeal- Honey Teaspoon -- Pharyngeal -- Pharyngeal- Honey Cup -- Pharyngeal -- Pharyngeal- Nectar Teaspoon -- Pharyngeal --  Pharyngeal- Nectar Cup Pharyngeal residue - valleculae;Reduced tongue base retraction;Reduced epiglottic inversion Pharyngeal -- Pharyngeal- Nectar Straw Delayed swallow initiation-vallecula;Pharyngeal residue - valleculae;Reduced epiglottic inversion;Reduced tongue base retraction Pharyngeal -- Pharyngeal- Thin Teaspoon Delayed swallow initiation-vallecula;Pharyngeal residue - valleculae;Reduced tongue base retraction;Reduced epiglottic inversion  Pharyngeal -- Pharyngeal- Thin Cup Delayed swallow initiation-pyriform sinuses;Pharyngeal residue - valleculae;Reduced epiglottic inversion;Reduced tongue base retraction Pharyngeal -- Pharyngeal- Thin Straw Delayed swallow initiation-pyriform sinuses;Pharyngeal residue - valleculae;Penetration/Aspiration during swallow Pharyngeal Material enters airway, passes BELOW cords without attempt by patient to eject out (silent aspiration) Pharyngeal- Puree Delayed swallow initiation-vallecula;Pharyngeal residue - valleculae;Reduced epiglottic inversion;Reduced pharyngeal peristalsis Pharyngeal -- Pharyngeal- Mechanical Soft -- Pharyngeal -- Pharyngeal- Regular Delayed swallow initiation-vallecula;Pharyngeal residue - valleculae;Reduced epiglottic inversion;Reduced tongue base retraction Pharyngeal -- Pharyngeal- Multi-consistency -- Pharyngeal -- Pharyngeal- Pill -- Pharyngeal -- Pharyngeal Comment --  CHL IP CERVICAL ESOPHAGEAL PHASE 01/03/2018 Cervical Esophageal Phase Impaired Pudding Teaspoon -- Pudding Cup -- Honey Teaspoon -- Honey Cup -- Nectar Teaspoon -- Nectar Cup -- Nectar Straw -- Thin Teaspoon -- Thin Cup -- Thin Straw -- Puree -- Mechanical Soft -- Regular -- Multi-consistency -- Pill -- Cervical Esophageal Comment residuals noted at pyriform sinus, upon esophageal sweep - pt's esophagus was largely clear No flowsheet data found. Macario Golds 01/03/2018, 1:35 PM      Luanna Salk, [facility] SLP [phone]          Scheduled Meds: . baclofen  5 mg Oral TID  . feeding supplement (GLUCERNA SHAKE)  237 mL Oral TID BM  . furosemide  20 mg Intravenous BID  . Gerhardt's butt cream   Topical QID  . insulin aspart  0-20 Units Subcutaneous TID AC & HS  . insulin glargine  25 Units Subcutaneous QHS  . lactobacillus  1 g Oral Q breakfast  . metoprolol tartrate  25 mg Oral BID  . OLANZapine  5 mg Oral QHS  . sodium chloride flush  5 mL Intracatheter Q12H   Continuous Infusions: . sodium chloride    .  fluconazole (DIFLUCAN) IV Stopped (01/02/18 1406)  . imipenem-cilastatin Stopped (01/03/18 0607)     LOS: 9 days    Time spent: Total of 35 minutes spent with pt, greater than 50% of which was spent in discussion of  treatment, counseling and coordination of care  Chipper Oman, MD Pager: Text Page via [email or web address]   If 7PM-7AM, please contact night-coverage [email or web address] 01/03/2018, 1:50 PM   Note - This record has been created using [employer]. Chart creation errors have been sought, but may not always have been located. Such creation errors do not reflect on the standard of medical care.

## 2018-01-03 NOTE — Progress Notes (Signed)
MBS completed, pt presents with moderately severe oropharyngeal dysphagia.  Delayed oral transiting/control noted with pt lingual rocking  - resulting in pt essentially spilling boluses into pharynx poorly controlled.  In addition, liquid boluses accumulate at pyriform sinus resulting in aspiration of thin liquids during the swallow.  Pt did NOT sense aspiration nor did he cough per verbal/visual cues to attempt to clear.  Pt is weak and subsequently oropharyngeal residuals are present - pt unable to elicit dry swallows or "hock" to propel base of tongue/vallecular residuals into oral cavity.  Pt is high aspiration and malnutrition due to current level of dysphagia, mentation and weakness.    Educated daughter Pamala Hurry to findings - including concerns for aspiration and malnutrition given lack of functional swallow using teach back and video picture.  Will follow up with family/pt.  Note palliative following but care plan is not full comfort at this time.       Preston Moore, [facility] SLP   [phone]

## 2018-01-03 NOTE — Progress Notes (Signed)
Nutrition Follow-up  DOCUMENTATION CODES:   Obesity unspecified, Severe malnutrition in context of acute illness/injury  INTERVENTION:    Glucerna Shake po TID, each supplement provides 220 kcal and 10 grams of protein  Monitor for diet advancement/toleration s/p MBS  NUTRITION DIAGNOSIS:   Severe Malnutrition related to acute illness(acute cholecystitis) as evidenced by percent weight loss, energy intake < or equal to 50% for > or equal to 5 days(12.3% weight loss in 1 month).  Ongoing  GOAL:   Patient will meet greater than or equal to 90% of their needs  Not meeting  MONITOR:   PO intake, Supplement acceptance, Labs, Weight trends, Skin, I & O's  REASON FOR ASSESSMENT:   Low Braden    ASSESSMENT:   84 year old male admitted with sepsis from a SNF. Pt is bedbound s/p bilateral hip replacement 2 years ago. PMH significant for DM on insulin, HTN, vitamin B12 deficiency, SBO in 2013, and HLD. Pt found to have liver abscess but is not a surgical candidate.   Spoke with daughter at bedside. Intake remains poor at this time. Pt having multiple "coughing episodes" upon eating and drinking. Pt placed back on full liquid diet until after MBS today. Pt drinking a total of two Glucernas per day. He does not drink the Juven. RD to d/c. Meal completions charted as 0-20% for the last eight meals. Weight noted decrease 1 lb since last RD visit 2/17 (219 lb to 218 lb). Will make appropriate changes to intervention once MBS is resulted.   Medications reviewed and include: 20 mg lasix, SSI, Lantus, IV abx Labs reviewed: Na 134 (L) CBG 185-217 BUN 38 (H)   Diet Order:  Diet full liquid Room service appropriate? Yes; Fluid consistency: Thin  EDUCATION NEEDS:   No education needs have been identified at this time  Skin:  Skin Assessment: Skin Integrity Issues: Skin Integrity Issues:: Stage II, Stage III, Unstageable, DTI DTI: left heel Stage II: bilateral buttocks Stage III: right  heel Unstageable: right heel Other: wound to L and R heel  Last BM:  01/01/18  Height:   Ht Readings from Last 1 Encounters:  12/26/17 5\' 11"  (1.803 m)    Weight:   Wt Readings from Last 1 Encounters:  01/03/18 218 lb 11.1 oz (99.2 kg)    Ideal Body Weight:  78.2 kg  BMI:  Body mass index is 30.5 kg/m.  Estimated Nutritional Needs:   Kcal:  2250-2450 kcal/day  Protein:  100-115 grams/day  Fluid:  2.2-2.4 L/day    Mariana Single RD, LDN Clinical Nutrition Pager # - [phone]

## 2018-01-03 NOTE — Progress Notes (Signed)
Modified Barium Swallow Progress Note  Patient Details  Name: Preston Moore MRN: [id number] Date of Birth: [birth year]  Today's Date: 01/03/2018  Modified Barium Swallow completed.  Full report located under Chart Review in the Imaging Section.  Brief recommendations include the following:  Clinical Impression  Delayed oral transiting/control noted with pt lingual rocking  - resulting in pt essentially spilling boluses into pharynx poorly controlled.  In addition, liquid boluses accumulate at pyriform sinus resulting in aspiration of thin liquids during the swallow.  Pt did NOT sense aspiration nor did he cough per verbal/visual cues to attempt to clear.  Pt is weak and subsequently oropharyngeal residuals are present - pt unable to elicit dry swallows or "hock" to propel base of tongue/vallecular residuals into oral cavity.  Pt is high aspiration and malnutrition due to current level of dysphagia, mentation and weakness.  Educated daughter Preston Moore to findings - including concerns for aspiration and malnutrition given lack of functional swallow using teach back and video picture.  Will follow up with family/pt.  Note palliative following but care plan is not full comfort at this time.        Swallow Evaluation Recommendations       SLP Diet Recommendations: NPO except meds;Ice chips PRN after oral care;NPO(defer to MD - as pt aspiration risk across board)       Medication Administration: Whole meds with puree           Postural Changes: Seated upright at 90 degrees;Remain semi-upright after after feeds/meals (Comment)   Oral Care Recommendations: Oral care QID      Luanna Salk, MS [facility] SLP [phone]   Macario Golds 01/03/2018,1:36 PM

## 2018-01-03 NOTE — Progress Notes (Signed)
INFECTIOUS DISEASE PROGRESS NOTE  ID: Preston Moore is a 84 y.o. male with  Active Problems:   Septic shock ([place])   Protein-calorie malnutrition, severe   Pressure injury of skin   Cholecystitis   Liver abscess   Peritonitis ([place])  Subjective: Resting quietly  Abtx:  Anti-infectives (From admission, onward)   Start     Dose/Rate Route Frequency Ordered Stop   01/01/18 1300  fluconazole (DIFLUCAN) IVPB 200 mg     200 mg 100 mL/hr over 60 Minutes Intravenous Every 24 hours 01/01/18 1235 01/08/18 1259   12/30/17 1230  imipenem-cilastatin (PRIMAXIN) 500 mg in sodium chloride 0.9 % 100 mL IVPB     500 mg 200 mL/hr over 30 Minutes Intravenous Every 6 hours 12/30/17 1132     12/28/17 1400  piperacillin-tazobactam (ZOSYN) IVPB 3.375 g  Status:  Discontinued     3.375 g 12.5 mL/hr over 240 Minutes Intravenous Every 8 hours 12/28/17 0851 12/30/17 1127   12/28/17 1000  ciprofloxacin (CIPRO) IVPB 200 mg  Status:  Discontinued     200 mg 100 mL/hr over 60 Minutes Intravenous Every 12 hours 12/28/17 0833 12/28/17 0848   12/27/17 2200  meropenem (MERREM) 1 g in sodium chloride 0.9 % 100 mL IVPB  Status:  Discontinued     1 g 200 mL/hr over 30 Minutes Intravenous Every 8 hours 12/27/17 1208 12/28/17 0833   12/25/17 2200  linezolid (ZYVOX) IVPB 600 mg  Status:  Discontinued     600 mg 300 mL/hr over 60 Minutes Intravenous Every 12 hours 12/25/17 1133 12/28/17 0833   12/25/17 2200  meropenem (MERREM) 1 g in sodium chloride 0.9 % 100 mL IVPB  Status:  Discontinued     1 g 200 mL/hr over 30 Minutes Intravenous Every 12 hours 12/25/17 1133 12/27/17 1208   12/25/17 1115  linezolid (ZYVOX) IVPB 600 mg     600 mg 300 mL/hr over 60 Minutes Intravenous STAT 12/25/17 1108 12/25/17 1346   12/25/17 1115  meropenem (MERREM) 1 g in sodium chloride 0.9 % 100 mL IVPB     1 g 200 mL/hr over 30 Minutes Intravenous STAT 12/25/17 1108 12/25/17 1244      Medications:  Scheduled: . baclofen  5 mg Oral  TID  . feeding supplement (GLUCERNA SHAKE)  237 mL Oral TID BM  . furosemide  20 mg Intravenous BID  . Gerhardt's butt cream   Topical QID  . insulin aspart  0-20 Units Subcutaneous TID AC & HS  . insulin glargine  25 Units Subcutaneous QHS  . lactobacillus  1 g Oral Q breakfast  . metoprolol tartrate  25 mg Oral BID  . OLANZapine  5 mg Oral QHS  . sodium chloride flush  5 mL Intracatheter Q12H    Objective: Vital signs in last 24 hours: Temp:  [98 F (36.7 C)-99.5 F (37.5 C)] 99.5 F (37.5 C) (03/21 0900) Pulse Rate:  [94-117] 117 (03/21 0900) Resp:  [16-20] 18 (03/21 0900) BP: (107-133)/(55-62) 133/62 (03/21 0900) SpO2:  [93 %-96 %] 95 % (03/21 0900) Weight:  [99.2 kg (218 lb 11.1 oz)] 99.2 kg (218 lb 11.1 oz) (03/21 0439)   General appearance: fatigued and no distress Resp: diminished breath sounds anterior - bilateral Cardio: regular rate and rhythm GI: normal findings: bowel sounds normal and soft, non-tender Extremities: LUE PIC.   Lab Results Recent Labs    01/02/18 0500 01/02/18 0858 01/03/18 0502  WBC  --  14.2* 14.0*  HGB  --  10.7* 10.6*  HCT  --  33.4* 33.1*  NA 132*  --  134*  K 5.1  --  4.1  CL 98*  --  98*  CO2 24  --  26  BUN 36*  --  38*  CREATININE 1.12  --  1.18   Liver Panel No results for input(s): PROT, ALBUMIN, AST, ALT, ALKPHOS, BILITOT, BILIDIR, IBILI in the last 72 hours. Sedimentation Rate No results for input(s): ESRSEDRATE in the last 72 hours. C-Reactive Protein No results for input(s): CRP in the last 72 hours.  Microbiology: Recent Results (from the past 240 hour(s))  Culture, blood (Routine x 2)     Status: None   Collection Time: 12/25/17 10:18 AM  Result Value Ref Range Status   Specimen Description   Final    BLOOD LEFT WRIST Performed at [facility] [address] [place], [place] [address]    Special Requests   Final    IN PEDIATRIC BOTTLE Blood Culture adequate volume Performed at [facility] [address] [place], [place] [address]    Culture   Final    NO GROWTH 5 DAYS Performed at [facility], [place] [address], [place], [place] [address]    Report Status 12/30/2017 FINAL  Final  Culture, blood (Routine x 2)     Status: Abnormal   Collection Time: 12/25/17 10:26 AM  Result Value Ref Range Status   Specimen Description   Final    BLOOD RIGHT HAND Performed at [facility] [address], [place], [place] [address]    Special Requests   Final    IN PEDIATRIC BOTTLE Blood Culture adequate volume Performed at [facility] [address], [place], [place] [address]    Culture  Setup Time   Final    GRAM POSITIVE COCCI IN PEDIATRIC BOTTLE CRITICAL RESULT CALLED TO, READ BACK BY AND VERIFIED WITH: J GRIMSLEY [facility] 12/26/17 0640 JDW    Culture (A)  Final    STAPHYLOCOCCUS SPECIES (COAGULASE NEGATIVE) THE SIGNIFICANCE OF ISOLATING THIS ORGANISM FROM A SINGLE SET OF BLOOD CULTURES WHEN MULTIPLE SETS ARE DRAWN IS UNCERTAIN. PLEASE NOTIFY THE MICROBIOLOGY DEPARTMENT WITHIN ONE WEEK IF SPECIATION AND SENSITIVITIES ARE REQUIRED. Performed at [facility], [place] [address], [place], [place] [address]    Report Status 12/27/2017 FINAL  Final  Blood Culture ID Panel (Reflexed)     Status: Abnormal   Collection Time: 12/25/17 10:26 AM  Result Value Ref Range Status   Enterococcus species NOT DETECTED NOT DETECTED Final   Listeria monocytogenes NOT DETECTED NOT DETECTED Final   Staphylococcus species DETECTED (A) NOT DETECTED Final    Comment: Methicillin (oxacillin) resistant coagulase negative staphylococcus. Possible blood culture contaminant (unless isolated from more than one blood culture draw or clinical case suggests pathogenicity). No antibiotic treatment is indicated for blood  culture contaminants. CRITICAL RESULT CALLED TO, READ BACK BY AND VERIFIED WITH: J GRIMSLEY [facility] 12/26/17 0640 JDW     Staphylococcus aureus NOT DETECTED NOT DETECTED Final   Methicillin resistance DETECTED (A) NOT DETECTED Final    Comment: CRITICAL RESULT CALLED TO, READ BACK BY AND VERIFIED WITH: J GRIMSLEY [facility] 12/26/17 0640 JDW    Streptococcus species NOT DETECTED NOT DETECTED Final   Streptococcus agalactiae NOT DETECTED NOT DETECTED Final   Streptococcus pneumoniae NOT DETECTED NOT DETECTED Final   Streptococcus pyogenes NOT DETECTED NOT DETECTED Final   Acinetobacter baumannii NOT DETECTED  NOT DETECTED Final   Enterobacteriaceae species NOT DETECTED NOT DETECTED Final   Enterobacter cloacae complex NOT DETECTED NOT DETECTED Final   Escherichia coli NOT DETECTED NOT DETECTED Final   Klebsiella oxytoca NOT DETECTED NOT DETECTED Final   Klebsiella pneumoniae NOT DETECTED NOT DETECTED Final   Proteus species NOT DETECTED NOT DETECTED Final   Serratia marcescens NOT DETECTED NOT DETECTED Final   Haemophilus influenzae NOT DETECTED NOT DETECTED Final   Neisseria meningitidis NOT DETECTED NOT DETECTED Final   Pseudomonas aeruginosa NOT DETECTED NOT DETECTED Final   Candida albicans NOT DETECTED NOT DETECTED Final   Candida glabrata NOT DETECTED NOT DETECTED Final   Candida krusei NOT DETECTED NOT DETECTED Final   Candida parapsilosis NOT DETECTED NOT DETECTED Final   Candida tropicalis NOT DETECTED NOT DETECTED Final    Comment: Performed at [facility], [place] [address] [place], [place] [address]  Urine culture     Status: None   Collection Time: 12/25/17  3:34 PM  Result Value Ref Range Status   Specimen Description   Final    URINE, RANDOM Performed at [facility] [address], [place], [place] [address]    Special Requests   Final    NONE Performed at [facility], [place] [address], [place], [place] [address]    Culture   Final    NO GROWTH Performed at [facility], [place] [address], [place], [place] [address]    Report Status  12/26/2017 FINAL  Final  MRSA PCR Screening     Status: None   Collection Time: 12/25/17  9:01 PM  Result Value Ref Range Status   MRSA by PCR NEGATIVE NEGATIVE Final    Comment:        The GeneXpert MRSA Assay (FDA approved for NASAL specimens only), is one component of a comprehensive MRSA colonization surveillance program. It is not intended to diagnose MRSA infection nor to guide or monitor treatment for MRSA infections. Performed at [facility], [place] [address], [place], [place] [address]   Aerobic/Anaerobic Culture (surgical/deep wound)     Status: None (Preliminary result)   Collection Time: 12/26/17  4:46 PM  Result Value Ref Range Status   Specimen Description   Final    ABSCESS PERIHEPATIC Performed at [facility] [address] [place], [place] [address]    Special Requests NONE SAMPLE B  Final   Gram Stain   Final    ABUNDANT WBC PRESENT, PREDOMINANTLY PMN RARE GRAM POSITIVE COCCI    Culture   Final    RARE ENTEROCOCCUS FAECALIS RARE ESCHERICHIA COLI MODERATE BACTEROIDES FRAGILIS BETA LACTAMASE POSITIVE Confirmed Extended Spectrum Beta-Lactamase Producer (ESBL).  In bloodstream infections from ESBL organisms, carbapenems are preferred over piperacillin/tazobactam. They are shown to have a lower risk of mortality. Performed at [facility], [place] [address], [place], [place] [address]    Report Status PENDING  Incomplete   Organism ID, Bacteria ENTEROCOCCUS FAECALIS  Final   Organism ID, Bacteria ESCHERICHIA COLI  Final      Susceptibility   Escherichia coli - MIC*    AMPICILLIN >=32 RESISTANT Resistant     CEFAZOLIN >=64 RESISTANT Resistant     CEFEPIME RESISTANT Resistant     CEFTAZIDIME RESISTANT Resistant     CEFTRIAXONE RESISTANT Resistant     CIPROFLOXACIN 1 SENSITIVE Sensitive     GENTAMICIN <=1 SENSITIVE Sensitive     IMIPENEM <=0.25 SENSITIVE Sensitive  TRIMETH/SULFA >=320 RESISTANT Resistant      AMPICILLIN/SULBACTAM 16 INTERMEDIATE Intermediate     PIP/TAZO <=4 SENSITIVE Sensitive     Extended ESBL POSITIVE Resistant     * RARE ESCHERICHIA COLI   Enterococcus faecalis - MIC*    AMPICILLIN <=2 SENSITIVE Sensitive     VANCOMYCIN 1 SENSITIVE Sensitive     GENTAMICIN SYNERGY SENSITIVE Sensitive     * RARE ENTEROCOCCUS FAECALIS  Aerobic/Anaerobic Culture (surgical/deep wound)     Status: None   Collection Time: 12/26/17  4:46 PM  Result Value Ref Range Status   Specimen Description GALL BLADDER  Final   Special Requests  SAMPLE A  Final   Gram Stain   Final    ABUNDANT WBC PRESENT, PREDOMINANTLY PMN FEW GRAM POSITIVE COCCI FEW GRAM NEGATIVE RODS    Culture   Final    ABUNDANT ENTEROCOCCUS FAECALIS ABUNDANT ESCHERICHIA COLI Confirmed Extended Spectrum Beta-Lactamase Producer (ESBL).  In bloodstream infections from ESBL organisms, carbapenems are preferred over piperacillin/tazobactam. They are shown to have a lower risk of mortality. ABUNDANT BACTEROIDES FRAGILIS BETA LACTAMASE POSITIVE Performed at [facility], [place] [address] [place], [place] [address]    Report Status 12/31/2017 FINAL  Final   Organism ID, Bacteria ENTEROCOCCUS FAECALIS  Final   Organism ID, Bacteria ESCHERICHIA COLI  Final      Susceptibility   Escherichia coli - MIC*    AMPICILLIN >=32 RESISTANT Resistant     CEFAZOLIN >=64 RESISTANT Resistant     CEFEPIME RESISTANT Resistant     CEFTAZIDIME RESISTANT Resistant     CEFTRIAXONE RESISTANT Resistant     CIPROFLOXACIN 1 SENSITIVE Sensitive     GENTAMICIN <=1 SENSITIVE Sensitive     IMIPENEM <=0.25 SENSITIVE Sensitive     TRIMETH/SULFA >=320 RESISTANT Resistant     AMPICILLIN/SULBACTAM >=32 RESISTANT Resistant     PIP/TAZO <=4 SENSITIVE Sensitive     Extended ESBL POSITIVE Resistant     * ABUNDANT ESCHERICHIA COLI   Enterococcus faecalis - MIC*    AMPICILLIN <=2 SENSITIVE Sensitive     VANCOMYCIN 1 SENSITIVE Sensitive     GENTAMICIN SYNERGY  SENSITIVE Sensitive     * ABUNDANT ENTEROCOCCUS FAECALIS  Culture, blood (routine x 2)     Status: None   Collection Time: 12/28/17  9:53 AM  Result Value Ref Range Status   Specimen Description   Final    BLOOD RIGHT ANTECUBITAL Performed at [facility] [address], [place], [place] [address]    Special Requests   Final    BOTTLES DRAWN AEROBIC ONLY Blood Culture adequate volume Performed at [facility] [address], [place], [place] [address]    Culture   Final    NO GROWTH 5 DAYS Performed at [facility], [place] [address], [place], [place] [address]    Report Status 01/02/2018 FINAL  Final  Culture, blood (routine x 2)     Status: None   Collection Time: 12/28/17 10:21 AM  Result Value Ref Range Status   Specimen Description   Final    BLOOD RIGHT HAND Performed at [facility] [address], [place], [place] [address]    Special Requests   Final    BOTTLES DRAWN AEROBIC ONLY Blood Culture adequate volume Performed at [facility] [address], [place],  [address]    Culture   Final    NO GROWTH 5 DAYS Performed at [facility], [place] [address], [place], [place] [address]    Report Status 01/02/2018 FINAL  Final    Studies/Results: Ct Abdomen Pelvis W Contrast  Result Date: 01/01/2018 CLINICAL DATA:  84 year old male with acalculous cholecystitis and liver abscess post drainage. Subsequent encounter. EXAM: CT ABDOMEN AND PELVIS WITH CONTRAST TECHNIQUE: Multidetector CT imaging of the abdomen and pelvis was performed using the standard protocol following bolus administration of intravenous contrast. CONTRAST:  145mL ISOVUE-300 IOPAMIDOL (ISOVUE-300) INJECTION 61% COMPARISON:  12/26/2017 drainage films.  12/25/2017 pre drainage CT. FINDINGS: Lower chest: Interval increase in size of right-sided pleural effusion (now moderate in size) which may be loculated. Small  left-sided pleural effusion. Bibasilar consolidation may represent atelectasis or infiltrate. Mild cardiomegaly. Coronary artery calcifications. Mitral and aortic valve calcifications. Hepatobiliary: Cholecystostomy tube has been place with decompression of gallbladder. Decrease in size of small abscesses within the liver adjacent to the gallbladder fossa largest measuring up to 1 cm versus prior 1.5 cm. Fluid collection along the anterior margin of the liver of similar size to slightly larger measuring 3.1 x 1.2 x 3.8 cm versus prior 3.1 x 1.4 by 3.8 cm. Pancreas: No pancreatic mass or primary pancreatic inflammation. Spleen: No splenic mass or enlargement. Adrenals/Urinary Tract: No obstructing stone or hydronephrosis. No worrisome renal or adrenal lesion. Noncontrast filled imaging of urinary bladder without gross abnormality although evaluation limited. Stomach/Bowel: No primary bowel inflammatory process. Vascular/Lymphatic: Atherosclerotic changes of tortuous aorta. Atherosclerotic changes aortic branch vessels. No abdominal aortic aneurysm or large vessel occlusion. Prominent narrowing origin superior mesenteric artery and right renal artery. No adenopathy. Reproductive: Prostate gland top-normal size. Other: Drain placed within the anterior right subphrenic abscess which has decreased significantly in size with a residual thickness of abscess measuring 1.4 cm versus prior 2.2 cm. Interval increase in size of left subphrenic collection now with maximal thickness of 1.6 cm versus prior 0.8 cm. Musculoskeletal: L1 superior endplate compression fracture once again noted and without change from recent exam. Degenerative changes L3-4 through L5-S1. Bilateral hip surgery with degenerative changes. IMPRESSION: Cholecystostomy tube has been place with decompression of gallbladder. Decrease in size of small abscesses within the liver adjacent to the gallbladder fossa largest measuring up to 1 cm versus prior 1.5 cm.  Fluid collection along the anterior margin of the liver of similar size to slightly larger measuring 3.1 x 1.2 x 3.8 cm versus prior 3.1 x 1.4 by 3.8 cm. Drain placed within the anterior right subphrenic abscess which has decreased significantly in size with a residual thickness of abscess measuring 1.4 cm versus prior 2.2 cm. Interval increase in size of left subphrenic collection now with maximal thickness of 1.6 cm versus prior 0.8 cm. Interval increase in size of right-sided pleural effusion (now moderate in size) and may be loculated. Small left-sided pleural effusion. Bibasilar consolidation may represent atelectasis or infiltrate. Aortic Atherosclerosis (ICD10-I70.0). Prominent narrowing origin superior mesenteric artery and right renal artery. Coronary artery calcification. L1 superior endplate compression fracture similar to recent CT. Electronically Signed   By: Genia Del M.D.   On: 01/01/2018 17:44   Dg Chest Port 1 View  Result Date: 01/01/2018 CLINICAL DATA:  Follow-up pneumonia, short of breath EXAM: PORTABLE CHEST 1 VIEW COMPARISON:  12/26/2017, 12/25/2017, 11/14/2017 FINDINGS: Left upper extremity catheter tip overlies the proximal right atrium. Development of small right-sided pleural effusion and probable small left effusion. Cardiomegaly with vascular congestion and mild pulmonary edema. Atherosclerosis of the aorta. No pneumothorax. Drainage catheters in the right upper quadrant.  IMPRESSION: 1. Left upper extremity catheter tip overlies the proximal right atrium 2. Development of small moderate right pleural effusion, likely layering and attributing to asymmetric hazy opacity in the right thorax 3. Cardiomegaly with vascular congestion and mild pulmonary edema. Probable small left effusion. Electronically Signed   By: Donavan Foil M.D.   On: 01/01/2018 18:27     Assessment/Plan: Acalculous cholecystitis Liver abscesses (4) ESBL E coli E faecalis Decubitus ulcers,  heels Candidiasis Oropharyngeal dysphagia DM2 Protein Calorie malnutrition (severe)  Total days of antibiotics:8 (imipenem 3, flucon 2)  WBC stable.  appreciate IR f/u and nutrition f/u.  Aim for 3 weeks of imipenem, duration dependant on f/u imaging at end of course.  Stop flucon at day 7 outpt Wound Care f/u.   Available as needed.          Bobby Rumpf MD, FACP Infectious Diseases (pager) [phone] [email or web address] 01/03/2018, 11:53 AM  LOS: 9 days

## 2018-01-04 LAB — GLUCOSE, CAPILLARY
GLUCOSE-CAPILLARY: 152 mg/dL — AB (ref 65–99)
Glucose-Capillary: 147 mg/dL — ABNORMAL HIGH (ref 65–99)
Glucose-Capillary: 88 mg/dL (ref 65–99)
Glucose-Capillary: 103 mg/dL — ABNORMAL HIGH (ref 65–99)

## 2018-01-04 LAB — CBC
HCT: 33 % — ABNORMAL LOW (ref 39.0–52.0)
Hemoglobin: 10.4 g/dL — ABNORMAL LOW (ref 13.0–17.0)
MCH: 27.8 pg (ref 26.0–34.0)
MCHC: 31.5 g/dL (ref 30.0–36.0)
MCV: 88.2 fL (ref 78.0–100.0)
PLATELETS: 510 10*3/uL — AB (ref 150–400)
RBC: 3.74 MIL/uL — ABNORMAL LOW (ref 4.22–5.81)
RDW: 15.3 % (ref 11.5–15.5)
WBC: 14.5 10*3/uL — ABNORMAL HIGH (ref 4.0–10.5)

## 2018-01-04 LAB — BASIC METABOLIC PANEL
Anion gap: 10 (ref 5–15)
BUN: 35 mg/dL — AB (ref 6–20)
CALCIUM: 8.3 mg/dL — AB (ref 8.9–10.3)
CO2: 27 mmol/L (ref 22–32)
CREATININE: 1.23 mg/dL (ref 0.61–1.24)
Chloride: 100 mmol/L — ABNORMAL LOW (ref 101–111)
GFR calc Af Amer: >60 mL/min (ref >60)
GFR calc non Af Amer: 53 mL/min — ABNORMAL LOW (ref >60)
GLUCOSE: 142 mg/dL — AB (ref 65–99)
Potassium: 3.7 mmol/L (ref 3.5–5.1)
Sodium: 137 mmol/L (ref 135–145)

## 2018-01-04 NOTE — Care Management Important Message (Signed)
Important Message  Patient Details IM Letter given to Cookie/Case Manager to present to the Patient Name: Preston Moore MRN: [id number] Date of Birth: 1934-08-15   Medicare Important Message Given:  Yes    Kerin Salen 01/04/2018, 11:36 AM

## 2018-01-04 NOTE — Progress Notes (Signed)
SLP Cancellation Note  Patient Details Name: DERYCK HIPPLER MRN: [id number] DOB: 1934/06/20   Cancelled treatment:       Reason Eval/Treat Not Completed: Other (comment)(slp to sign off given plan for comfort measures at this time, thanks)   Luanna Salk, [facility] SLP [phone]

## 2018-01-04 NOTE — Progress Notes (Signed)
Met with pt's daughters at bedside and also discussed disposition with PMT. Daughters ready to pursue full comfort care and hospice home deemed most appropriate setting.  Discussed options with daughters and made referral to [facility]).  Sharren Bridge, MSW, LCSW Clinical Social Work 01/04/2018 [phone]

## 2018-01-04 NOTE — Progress Notes (Signed)
Daily Progress Note   Patient Name: Preston Moore       Date: 01/04/2018 DOB: [birth year]  Age: 84 y.o. MRN#: [id number] Attending Physician: Patrecia Pour, Christean Grief, MD Primary Care Physician: Gildardo Cranker, DO Admit Date: 12/25/2017  Reason for Consultation/Follow-up: Establishing goals of care  Subjective:  patient is awake resting in bed He is non verbal Daughters at bedside, state that the patient has not asked for anything to eat or drink for the past 2 days now.  See discussions below  Length of Stay: 10  Current Medications: Scheduled Meds:  . baclofen  5 mg Oral TID  . feeding supplement (GLUCERNA SHAKE)  237 mL Oral TID BM  . furosemide  20 mg Intravenous BID  . Gerhardt's butt cream   Topical QID  . insulin aspart  0-20 Units Subcutaneous TID AC & HS  . insulin glargine  25 Units Subcutaneous QHS  . lactobacillus  1 g Oral Q breakfast  . metoprolol tartrate  25 mg Oral BID  . OLANZapine  5 mg Oral QHS  . sodium chloride flush  5 mL Intracatheter Q12H    Continuous Infusions: . sodium chloride 250 mL (01/03/18 1629)  . fluconazole (DIFLUCAN) IV Stopped (01/03/18 1547)  . imipenem-cilastatin 500 mg (01/04/18 1057)    PRN Meds: sodium chloride, acetaminophen, metoprolol tartrate, morphine injection, ondansetron (ZOFRAN) IV, sodium chloride flush  Physical Exam         Elderly gentleman sitting comfortably in a bed  Awake but not alert Shallow clear breath sounds anteriorly Regular Abdomen soft non tender Bilateral lower extremities have edema and heels are wrapped in Patient has abdominal drain  Vital Signs: BP 117/63 (BP Location: Right Arm)   Pulse 95   Temp 97.8 F (36.6 C) (Oral)   Resp 18   Ht 5\' 11"  (1.803 m)   Wt 100.1 kg (220 lb 10.9 oz)   SpO2 92%    BMI 30.78 kg/m  SpO2: SpO2: 92 % O2 Device: O2 Device: Nasal Cannula O2 Flow Rate: O2 Flow Rate (L/min): 1 L/min  Intake/output summary:   Intake/Output Summary (Last 24 hours) at 01/04/2018 1149 Last data filed at 01/04/2018 1100 Gross per 24 hour  Intake 455.17 ml  Output 810 ml  Net -354.83 ml   LBM: Last BM Date: 01/01/18 Baseline Weight:  Weight: 96.6 kg (213 lb) Most recent weight: Weight: 100.1 kg (220 lb 10.9 oz)      Palliative performance scale 30% Palliative Assessment/Data:    Flowsheet Rows     Most Recent Value  Intake Tab  Referral Department  Hospitalist  Unit at Time of Referral  Med/Surg Unit  Palliative Care Primary Diagnosis  Cardiac  Date Notified  12/27/17  Palliative Care Type  New Palliative care  Reason for referral  Clarify Goals of Care  Date of Admission  12/25/17  Date first seen by Palliative Care  12/29/17  # of days Palliative referral response time  2 Day(s)  # of days IP prior to Palliative referral  2  Clinical Assessment  Psychosocial & Spiritual Assessment  Palliative Care Outcomes      Patient Active Problem List   Diagnosis Date Noted  . Cholecystitis   . Liver abscess   . Peritonitis ([place])   . Pressure injury of skin 12/28/2017  . Protein-calorie malnutrition, severe 12/26/2017  . Septic shock ([place]) 12/25/2017  . Decubitus ulcer of left buttock, stage 3 ([place]) 12/12/2017  . Acute encephalopathy 11/15/2017  . Sepsis ([place]) 11/14/2017  . Depression 10/28/2017  . Insomnia due to other mental disorder 10/28/2017  . Edema of both lower extremities 10/28/2017  . Pressure injury of skin of right buttock 10/28/2017  . Blisters of multiple sites 10/25/2017  . Diabetic peripheral neuropathy ([place]) 10/25/2017  . Type II diabetes mellitus with renal manifestations, uncontrolled ([place]) 08/21/2017  . Dyslipidemia associated with type 2 diabetes mellitus ([place]) 08/21/2017  . Type 2 diabetes mellitus with diabetic neuropathy, with  long-term current use of insulin ([place]) 08/21/2017  . Fall   . Closed right hip fracture, initial encounter ([place]) 04/06/2017  . CKD (chronic kidney disease), stage III ([place])   . Essential hypertension   . Obesity (BMI 30.0-34.9) 02/29/2012  . GERD without esophagitis 02/22/2012  . Iron deficiency anemia 02/18/2012  . Ischemic cardiomyopathy 11/01/2010  . UNSPECIFIED PERIPHERAL VASCULAR DISEASE 10/05/2010    Palliative Care Assessment & Plan   Patient Profile:    Assessment:  84 year old woman from [facility] facility admitted with septic shock, found to have severe protein calorie malnutrition, admitted with a calculus cholecystitis, liver abscess, ESBL Escherichia coli, E faecalis. Patient has decubitus ulcers, heels. In this hospitalization also has been found to have oral candidiasis. Underlying history of diabetes and hip fractures. Patient has been living at [facility]  facility since November 2018 in the skilled level of care. Prior to that was a penny burn for rehabilitation. Has 4 daughters. Palliative team is following for goals of care discussions. Patient underwent repeat CT scan on 01-01-18. MBS on 01-03-18.  Results discussed with daughter at the bedside. See recommendations below.  Recommendations/Plan:   family meeting with patient's 3 daughters held by the patient's bedside and later on, also outside the room, patient's son was also present over the phone. We reviewed the patient's serious illnesses, ongoing decline, recent MBS results. Both daughters are in favor of comfort measures and hospice level of care rather than artificial nutrition. We discussed about comfort feeds, not with holding nutrition if the patient asks for it, discussed about carefully offering few sips of liquid by hand when patient is asking for something, and when he is most awake/alert.Family is in agreement, they stated that the patient has not asked for anything to eat or drink. Discussed that sometimes in  the course of the serious illness, the course of the  dying process, patient's do not have thirst and hunger.  We then discussed about continuing IV Abx and pursuing SNF with comfort feeds versus full scope of comfort measures, no IV Abx and comfort feeds, possibly in a residential hospice setting.   After extensive discussions, family elects full scope of comfort measures, care that can be provided in a residential hospice setting. They state that they're fully aware of the patient's overall addition of decline that was already in effect even prior to this hospitalization. The don't want any scope of artificial nutrition or hydration. We did review pros and cons of PEG tube placement. Family does not like for artificial source of nutrition or hydration at this time.  Hospice options discussed. Will request clinical social worker in particular facilitate residential hospice transfer. All of the family's questions answered to the best of my ability.Continue current scope of care for now.     Code Status:    Code Status Orders  (From admission, onward)        Start     Ordered   12/26/17 1158  Do not attempt resuscitation (DNR)  Continuous    Question Answer Comment  In the event of cardiac or respiratory ARREST Do not call a "code blue"   In the event of cardiac or respiratory ARREST Do not perform Intubation, CPR, defibrillation or ACLS   In the event of cardiac or respiratory ARREST Use medication by any route, position, wound care, and other measures to relive pain and suffering. May use oxygen, suction and manual treatment of airway obstruction as needed for comfort.      12/26/17 1157    Code Status History    Date Active Date Inactive Code Status Order ID Comments User Context   12/25/2017 15:13 12/26/2017 11:57 Full Code [id number]  Rigoberto Noel, MD ED   11/15/2017 03:59 11/17/2017 22:32 Full Code [id number]  Rise Patience, MD Inpatient   04/06/2017 17:09 04/10/2017 18:07 Full Code  [id number]  Rosita Fire, MD ED   02/18/2012 02:32 02/22/2012 18:24 Full Code [id number]  Theressa Millard, MD ED    Advance Directive Documentation     Most Recent Value  Type of Advance Directive  Out of facility DNR (pink MOST or yellow form)  Pre-existing out of facility DNR order (yellow form or pink MOST form)  Yellow form placed in chart (order not valid for inpatient use)  "MOST" Form in Place?  No data       Prognosis:   guarded, could be less than 2 weeks given ongoing decline, recent MBS study results.   Discharge Planning:  residential hospice soon    Care plan was discussed with    Daughters, son via phone, CSW  Thank you for allowing the Palliative Medicine Team to assist in the care of this patient.   Time In: 10 Time Out: 11 Total Time 60 Prolonged Time Billed Yes       Greater than 50%  of this time was spent counseling and coordinating care related to the above assessment and plan.  Loistine Chance, MD [phone]  Please contact Palliative Medicine Team phone at [phone] for questions and concerns.

## 2018-01-04 NOTE — Progress Notes (Signed)
PROGRESS NOTE [facility]   WAYLIN DORKO   DEY:[id number] DOB: 11-22-1933  DOA: 12/25/2017 PCP: Gildardo Cranker, DO   Brief Narrative:  MICHAEAL Moore is 84 year old male with significant medical history of systolic heart failure with EF of 35%, bedbound, type 2 diabetes mellitus, A. fib and hypertension presented to the emergency department with tachycardia.  He is a nursing home resident was noted to have significant tachycardia, fever and hypotension and was sent to the ED for further evaluation.  On initial exam patient was hemodynamically stable.  Abdominal CT with several low-attenuation foci within the medial segment of the left lobe of the, continues to the gallbladder suspicion for small liver abscess liver subdiaphragmatic fluid collection was identified.  Chest x-ray showed hyperinflated lungs.  An EKG with A. fib with RVR. He was admitted with working diagnosis of sepsis due to liver abscess, acalculous cholecystitis with complicated acute renal failure and A. fib with RVR.  Patient underwent percutaneous cholecystectomy and perihepatic fluid drainage on 12/26/17 by IR.  Fluid collection reveal ESBL, ID was consulted and patient was started on Merrem.  Patient has continued to decline despite medical therapy, became fluid overload and now receiving Lasix.  Also severe deconditioning with malnutrition.  MBS shows severe risk of aspiration with severe dysphagia.  Subjective: Patient seen and examined, he awake report doing ok. No pain or SOB.  Palliative care discussed with daughters plan of care and family has decided to place the patient on full comfort measures.  Assessment & Plan: Sepsis due to acalculous cholecystitis and liver abscess/enterococcus and ESBL E. coli Status post percutaneous cholecystectomy with JP drain of liver abscess. ID was consulted and recommended IV imipenem for at least 3 weeks.  PICC line was placed for long-term antibiotics.  Repeated CT scan from 3/20  discussed with IR, they do not see any fluid collection that needs to be drained at this moment and does not recommend any further interventions.  I discussed findings with surgical team which also is not recommending any surgical interventions.  Patient was continued on antibiotics but as he has been transitioned to comfort care family opted to stop antibiotic therapy as well.  A. fib with RVR Heart rate well controlled, not on anti-correlation Continue comfort measures  Acute on chronic systolic heart failure with EF of 30-35% Secondary to aggressive hydration due to infectious process On IV Lasix had good diuresis, will keep lasix for now as this can help breathing relief from fluid overload.   Acute respiratory failure with hypoxia secondary to fluid overload. Treat as above Morphine if needed Continue O2 supplement as needed   AKI - resolved Creatinine at baseline Continue monitor closely  Oral candidiasis In setting of patient with type 2 diabetes mellitus Was treated with IV Diflucan, this has been d/ced given comfort measures   Type 2 diabetes mellitus D/c finger stick and Lantus  Comfort measures only   Hypertension BP stable on metoprolol 25 mg twice daily Continue to monitor  Dysphagia Likely due to deconditioning and oral thrush questionable if esophageal candidiasis as well. Patient was treated with diflucan  Modified barium swallow shows severe dysphagia and high risk of aspiration likely from deconditioning.  SLP has recommended to keep patient n.p.o.  Goals of Care  Given the extent declining of patient with minimal chance of meaningful recovery family has opted to make patient comfort care.  DVT prophylaxis: SCDs Code Status: DNR Family Communication: Daughter at bedside Disposition Plan: Residential hospice in a.m.  Consultants:   IR  General surgery  ID  Procedures:     Antimicrobials: Anti-infectives (From admission, onward)   Start      Dose/Rate Route Frequency Ordered Stop   01/03/18 2000  imipenem-cilastatin (PRIMAXIN) 500 mg in sodium chloride 0.9 % 100 mL IVPB     500 mg 200 mL/hr over 30 Minutes Intravenous Every 6 hours 01/03/18 1824     01/01/18 1300  fluconazole (DIFLUCAN) IVPB 200 mg     200 mg 100 mL/hr over 60 Minutes Intravenous Every 24 hours 01/01/18 1235 01/08/18 1259   12/30/17 1230  imipenem-cilastatin (PRIMAXIN) 500 mg in sodium chloride 0.9 % 100 mL IVPB  Status:  Discontinued     500 mg 200 mL/hr over 30 Minutes Intravenous Every 6 hours 12/30/17 1132 01/03/18 1824   12/28/17 1400  piperacillin-tazobactam (ZOSYN) IVPB 3.375 g  Status:  Discontinued     3.375 g 12.5 mL/hr over 240 Minutes Intravenous Every 8 hours 12/28/17 0851 12/30/17 1127   12/28/17 1000  ciprofloxacin (CIPRO) IVPB 200 mg  Status:  Discontinued     200 mg 100 mL/hr over 60 Minutes Intravenous Every 12 hours 12/28/17 0833 12/28/17 0848   12/27/17 2200  meropenem (MERREM) 1 g in sodium chloride 0.9 % 100 mL IVPB  Status:  Discontinued     1 g 200 mL/hr over 30 Minutes Intravenous Every 8 hours 12/27/17 1208 12/28/17 0833   12/25/17 2200  linezolid (ZYVOX) IVPB 600 mg  Status:  Discontinued     600 mg 300 mL/hr over 60 Minutes Intravenous Every 12 hours 12/25/17 1133 12/28/17 0833   12/25/17 2200  meropenem (MERREM) 1 g in sodium chloride 0.9 % 100 mL IVPB  Status:  Discontinued     1 g 200 mL/hr over 30 Minutes Intravenous Every 12 hours 12/25/17 1133 12/27/17 1208   12/25/17 1115  linezolid (ZYVOX) IVPB 600 mg     600 mg 300 mL/hr over 60 Minutes Intravenous STAT 12/25/17 1108 12/25/17 1346   12/25/17 1115  meropenem (MERREM) 1 g in sodium chloride 0.9 % 100 mL IVPB     1 g 200 mL/hr over 30 Minutes Intravenous STAT 12/25/17 1108 12/25/17 1244        Objective: Vitals:   01/03/18 2126 01/04/18 0550 01/04/18 0700 01/04/18 1822  BP: 116/65 117/63  (!) 100/47  Pulse: 68 95  (!) 116  Resp: 18 18    Temp:  97.8 F (36.6  C)  97.6 F (36.4 C)  TempSrc:  Oral  Oral  SpO2: 93% 92%  97%  Weight:   100.1 kg (220 lb 10.9 oz)   Height:        Intake/Output Summary (Last 24 hours) at 01/04/2018 1856 Last data filed at 01/04/2018 1800 Gross per 24 hour  Intake 875.17 ml  Output 1685 ml  Net -809.83 ml   Filed Weights   01/02/18 0500 01/03/18 0439 01/04/18 0700  Weight: 93.4 kg (205 lb 14.6 oz) 99.2 kg (218 lb 11.1 oz) 100.1 kg (220 lb 10.9 oz)    Examination:  General: Sitting comfortable, awake but not alert Cardiovascular: Irregular irregular, S1/S2 +, no rubs, no gallops Respiratory: Shallow breath, air entry clear. Abdominal: Soft, NT, ND, bowel sounds +, abdominal drains in place Extremities: Right lateral lower extremity edema  Data Reviewed: I have personally reviewed following labs and imaging studies  CBC: Recent Labs  Lab 12/29/17 0437 12/30/17 0640 01/01/18 0352 01/02/18 0858 01/03/18 0502 01/04/18 0432  WBC  13.5* 11.6* 15.3* 14.2* 14.0* 14.5*  NEUTROABS 10.3* 8.1* 12.0* 11.1* 10.4*  --   HGB 11.4* 11.2* 10.7* 10.7* 10.6* 10.4*  HCT 36.0* 35.1* 33.9* 33.4* 33.1* 33.0*  MCV 88.0 86.9 88.5 87.2 86.9 88.2  PLT 409* 387 432* 389 459* 572*   Basic Metabolic Panel: Recent Labs  Lab 12/30/17 0640 01/01/18 0352 01/02/18 0500 01/03/18 0502 01/04/18 0432  NA 135 135 132* 134* 137  K 4.1 3.9 5.1 4.1 3.7  CL 101 100* 98* 98* 100*  CO2 24 24 24 26 27   GLUCOSE 176* 172* 185* 217* 142*  BUN 20 30* 36* 38* 35*  CREATININE 1.23 1.10 1.12 1.18 1.23  CALCIUM 7.9* 8.0* 7.8* 8.3* 8.3*  MG  --   --  1.8  --   --    GFR: Estimated Creatinine Clearance: 56.8 mL/min (by C-G formula based on SCr of 1.23 mg/dL). Liver Function Tests: No results for input(s): AST, ALT, ALKPHOS, BILITOT, PROT, ALBUMIN in the last 168 hours. No results for input(s): LIPASE, AMYLASE in the last 168 hours. No results for input(s): AMMONIA in the last 168 hours. Coagulation Profile: No results for input(s):  INR, PROTIME in the last 168 hours. Cardiac Enzymes: No results for input(s): CKTOTAL, CKMB, CKMBINDEX, TROPONINI in the last 168 hours. BNP (last 3 results) No results for input(s): PROBNP in the last 8760 hours. HbA1C: No results for input(s): HGBA1C in the last 72 hours. CBG: Recent Labs  Lab 01/03/18 1653 01/03/18 2121 01/04/18 0751 01/04/18 1132 01/04/18 1727  GLUCAP 133* 121* 152* 147* 88   Lipid Profile: No results for input(s): CHOL, HDL, LDLCALC, TRIG, CHOLHDL, LDLDIRECT in the last 72 hours. Thyroid Function Tests: No results for input(s): TSH, T4TOTAL, FREET4, T3FREE, THYROIDAB in the last 72 hours. Anemia Panel: No results for input(s): VITAMINB12, FOLATE, FERRITIN, TIBC, IRON, RETICCTPCT in the last 72 hours. Sepsis Labs: No results for input(s): PROCALCITON, LATICACIDVEN in the last 168 hours.  Recent Results (from the past 240 hour(s))  MRSA PCR Screening     Status: None   Collection Time: 12/25/17  9:01 PM  Result Value Ref Range Status   MRSA by PCR NEGATIVE NEGATIVE Final    Comment:        The GeneXpert MRSA Assay (FDA approved for NASAL specimens only), is one component of a comprehensive MRSA colonization surveillance program. It is not intended to diagnose MRSA infection nor to guide or monitor treatment for MRSA infections. Performed at [facility], [place] [address] [place], [place] [address]   Aerobic/Anaerobic Culture (surgical/deep wound)     Status: None   Collection Time: 12/26/17  4:46 PM  Result Value Ref Range Status   Specimen Description   Final    ABSCESS PERIHEPATIC Performed at [facility] [address] [place], [place] [address]    Special Requests NONE SAMPLE B  Final   Gram Stain   Final    ABUNDANT WBC PRESENT, PREDOMINANTLY PMN RARE GRAM POSITIVE COCCI    Culture   Final    RARE ENTEROCOCCUS FAECALIS RARE ESCHERICHIA COLI MODERATE BACTEROIDES FRAGILIS BETA LACTAMASE  POSITIVE Confirmed Extended Spectrum Beta-Lactamase Producer (ESBL).  In bloodstream infections from ESBL organisms, carbapenems are preferred over piperacillin/tazobactam. They are shown to have a lower risk of mortality. Performed at [facility], [place] [address], [place], [place] [address]    Report Status 01/03/2018 FINAL  Final   Organism ID, Bacteria ENTEROCOCCUS FAECALIS  Final  Organism ID, Bacteria ESCHERICHIA COLI  Final      Susceptibility   Escherichia coli - MIC*    AMPICILLIN >=32 RESISTANT Resistant     CEFAZOLIN >=64 RESISTANT Resistant     CEFEPIME RESISTANT Resistant     CEFTAZIDIME RESISTANT Resistant     CEFTRIAXONE RESISTANT Resistant     CIPROFLOXACIN 1 SENSITIVE Sensitive     GENTAMICIN <=1 SENSITIVE Sensitive     IMIPENEM <=0.25 SENSITIVE Sensitive     TRIMETH/SULFA >=320 RESISTANT Resistant     AMPICILLIN/SULBACTAM 16 INTERMEDIATE Intermediate     PIP/TAZO <=4 SENSITIVE Sensitive     Extended ESBL POSITIVE Resistant     * RARE ESCHERICHIA COLI   Enterococcus faecalis - MIC*    AMPICILLIN <=2 SENSITIVE Sensitive     VANCOMYCIN 1 SENSITIVE Sensitive     GENTAMICIN SYNERGY SENSITIVE Sensitive     * RARE ENTEROCOCCUS FAECALIS  Aerobic/Anaerobic Culture (surgical/deep wound)     Status: None   Collection Time: 12/26/17  4:46 PM  Result Value Ref Range Status   Specimen Description GALL BLADDER  Final   Special Requests  SAMPLE A  Final   Gram Stain   Final    ABUNDANT WBC PRESENT, PREDOMINANTLY PMN FEW GRAM POSITIVE COCCI FEW GRAM NEGATIVE RODS    Culture   Final    ABUNDANT ENTEROCOCCUS FAECALIS ABUNDANT ESCHERICHIA COLI Confirmed Extended Spectrum Beta-Lactamase Producer (ESBL).  In bloodstream infections from ESBL organisms, carbapenems are preferred over piperacillin/tazobactam. They are shown to have a lower risk of mortality. ABUNDANT BACTEROIDES FRAGILIS BETA LACTAMASE POSITIVE Performed at [facility], [place] [address],  [place], [place] [address]    Report Status 12/31/2017 FINAL  Final   Organism ID, Bacteria ENTEROCOCCUS FAECALIS  Final   Organism ID, Bacteria ESCHERICHIA COLI  Final      Susceptibility   Escherichia coli - MIC*    AMPICILLIN >=32 RESISTANT Resistant     CEFAZOLIN >=64 RESISTANT Resistant     CEFEPIME RESISTANT Resistant     CEFTAZIDIME RESISTANT Resistant     CEFTRIAXONE RESISTANT Resistant     CIPROFLOXACIN 1 SENSITIVE Sensitive     GENTAMICIN <=1 SENSITIVE Sensitive     IMIPENEM <=0.25 SENSITIVE Sensitive     TRIMETH/SULFA >=320 RESISTANT Resistant     AMPICILLIN/SULBACTAM >=32 RESISTANT Resistant     PIP/TAZO <=4 SENSITIVE Sensitive     Extended ESBL POSITIVE Resistant     * ABUNDANT ESCHERICHIA COLI   Enterococcus faecalis - MIC*    AMPICILLIN <=2 SENSITIVE Sensitive     VANCOMYCIN 1 SENSITIVE Sensitive     GENTAMICIN SYNERGY SENSITIVE Sensitive     * ABUNDANT ENTEROCOCCUS FAECALIS  Culture, blood (routine x 2)     Status: None   Collection Time: 12/28/17  9:53 AM  Result Value Ref Range Status   Specimen Description   Final    BLOOD RIGHT ANTECUBITAL Performed at [facility] [address], [place], [place] [address]    Special Requests   Final    BOTTLES DRAWN AEROBIC ONLY Blood Culture adequate volume Performed at [facility] [address] [place], [place] [address]    Culture   Final    NO GROWTH 5 DAYS Performed at [facility], [place] [address], [place], [place] [address]    Report Status 01/02/2018 FINAL  Final  Culture, blood (routine x 2)     Status: None   Collection Time: 12/28/17 10:21 AM  Result Value Ref Range Status   Specimen Description   Final    BLOOD RIGHT HAND Performed at [facility] [address], [place], [place] [address]    Special Requests   Final    BOTTLES DRAWN AEROBIC ONLY Blood Culture adequate volume Performed at [facility] [address], [place], [place] [address]    Culture   Final    NO GROWTH 5 DAYS Performed at [facility], [place] [address] [place],  [address]    Report Status 01/02/2018 FINAL  Final      Radiology Studies: Dg Chest Port 1 View  Result Date: 01/03/2018 CLINICAL DATA:  Aspiration.  Smoker. EXAM: PORTABLE CHEST 1 VIEW COMPARISON:  01/01/2018 FINDINGS: Interval significant patient rotation to the left. No gross change in enlargement of the cardiac silhouette increased right lung opacity and pleural fluid. Decreased left basilar patchy opacity. Thoracic spine degenerative changes. Two pigtail drainage catheters overlying the right lung base and right upper abdomen. Barium in the stomach. Left PICC tip poorly visualized, not grossly changed. IMPRESSION: 1. Increased right pleural fluid and right lung atelectasis, pneumonia or pneumonitis. 2. Mildly improved left basilar pneumonia or aspiration pneumonitis. Electronically Signed   By: Claudie Revering M.D.   On: 01/03/2018 16:46   Dg Swallowing Func-speech Pathology  Result Date: 01/03/2018 Objective Swallowing Evaluation: Type of Study: MBS-Modified Barium Swallow Study  Patient Details Name: Preston Moore MRN: [id number] Date of Birth: May 18, 1934 Today's Date: 01/03/2018 Time: SLP Start Time (ACUTE ONLY): 1258 -SLP Stop Time (ACUTE ONLY): 1329 SLP Time Calculation (min) (ACUTE ONLY): 31 min Past Medical History: Past Medical History: Diagnosis Date . Atrial flutter ([place])  . B12 deficiency 02/22/2012 . Closed right hip fracture, initial encounter ([place]) 04/06/2017 . Diabetes mellitus  . Hyperlipidemia  . Hypertension  . Ileus ([place]) 02/29/2012 . Malnutrition ([place]) 02/29/2012 . Neuropathy  . Non compliance with medical treatment 02/29/2012 . Pancreatitis 02/22/2012 . SBO (small bowel obstruction) ([place]) 02/23/2012 . Syncope and collapse  Past Surgical History: Past Surgical History: Procedure Laterality Date . BACK SURGERY   . COMPRESSION HIP SCREW Right 04/07/2017   Procedure: RIGHT HIP INTRATROCHANTERIC NAILING;  Surgeon: [name], MD;  Location: [facility];  Service: Orthopedics;  Laterality: Right; . IR IMAGE GUIDED DRAINAGE PERCUT CATH  PERITONEAL RETROPERIT  12/26/2017 . IR PERC CHOLECYSTOSTOMY  12/26/2017 . LAMINECTOMY  1965 HPI: 84 yo male adm to [facility] hospital - required cholecystotomy drain - PMH + for closed hip fx and has been bed bound since per chart.  Pt found to have pna and has problems swallowing. MD ordered swallow evaluation.  Daughter reports pt fed himself prior to admission but since admitted, he is weak and does not want to eat/drink much. She states he will take a bite or few sips and then he does not want anymore intake.  Family friend Rosann Auerbach reported pt has not been as interactive over the last month as normal.  Pt noted to have oral candidiasis yesterday and diet modified to full liquids yesterday due to oral holding.   Subjective: pt awake in chair Assessment / Plan / Recommendation CHL IP CLINICAL IMPRESSIONS 01/03/2018 Clinical Impression Delayed oral transiting/control noted with pt lingual rocking  - resulting in pt essentially spilling boluses into pharynx poorly controlled.  In addition, liquid boluses accumulate at pyriform sinus resulting in aspiration of thin liquids during the swallow.  Pt did NOT sense aspiration nor did he cough per verbal/visual  cues to attempt to clear.  Pt is weak and subsequently oropharyngeal residuals are present - pt unable to elicit dry swallows or "hock" to propel base of tongue/vallecular residuals into oral cavity.  Pt is high aspiration and malnutrition due to current level of dysphagia, mentation and weakness.  Educated daughter Pamala Hurry to findings - including concerns for aspiration and malnutrition given lack of functional swallow using teach back and video picture.  Will follow up with family/pt.  Note palliative following but care plan is not full comfort at this time.     SLP Visit Diagnosis Dysphagia,  oropharyngeal phase (R13.12) Attention and concentration deficit following -- Frontal lobe and executive function deficit following -- Impact on safety and function Moderate aspiration risk;Severe aspiration risk   CHL IP TREATMENT RECOMMENDATION 01/03/2018 Treatment Recommendations Therapy as outlined in treatment plan below   Prognosis 01/03/2018 Prognosis for Safe Diet Advancement Guarded Barriers to Reach Goals Severity of deficits Barriers/Prognosis Comment -- CHL IP DIET RECOMMENDATION 01/03/2018 SLP Diet Recommendations NPO except meds;Ice chips PRN after oral care;NPO Liquid Administration via -- Medication Administration Whole meds with puree Compensations -- Postural Changes Seated upright at 90 degrees;Remain semi-upright after after feeds/meals (Comment)   CHL IP OTHER RECOMMENDATIONS 01/03/2018 Recommended Consults -- Oral Care Recommendations Oral care QID Other Recommendations --   CHL IP FOLLOW UP RECOMMENDATIONS 01/03/2018 Follow up Recommendations (No Data)   CHL IP FREQUENCY AND DURATION 01/03/2018 Speech Therapy Frequency (ACUTE ONLY) min 1 x/week Treatment Duration 1 week      CHL IP ORAL PHASE 01/03/2018 Oral Phase Impaired Oral - Pudding Teaspoon -- Oral - Pudding Cup -- Oral - Honey Teaspoon -- Oral - Honey Cup -- Oral - Nectar Teaspoon Delayed oral transit;Decreased bolus cohesion;Weak lingual manipulation;Reduced posterior propulsion;Premature spillage;Holding of bolus;Lingual pumping Oral - Nectar Cup Delayed oral transit;Decreased bolus cohesion;Weak lingual manipulation;Premature spillage;Piecemeal swallowing;Holding of bolus;Lingual pumping Oral - Nectar Straw Delayed oral transit;Decreased bolus cohesion;Weak lingual manipulation;Premature spillage;Piecemeal swallowing;Holding of bolus;Lingual pumping Oral - Thin Teaspoon Weak lingual manipulation;Delayed oral transit;Decreased bolus cohesion;Premature spillage;Piecemeal swallowing;Holding of bolus;Lingual pumping Oral - Thin Cup -- Oral -  Thin Straw Weak lingual manipulation;Premature spillage;Decreased bolus cohesion;Reduced posterior propulsion;Holding of bolus;Piecemeal swallowing;Lingual pumping Oral - Puree Decreased bolus cohesion;Lingual pumping;Weak lingual manipulation;Premature spillage;Reduced posterior propulsion;Holding of bolus;Piecemeal swallowing Oral - Mech Soft -- Oral - Regular Decreased bolus cohesion;Holding of bolus;Impaired mastication;Delayed oral transit;Weak lingual manipulation;Lingual pumping;Reduced posterior propulsion;Piecemeal swallowing;Premature spillage Oral - Multi-Consistency -- Oral - Pill -- Oral Phase - Comment --  CHL IP PHARYNGEAL PHASE 01/03/2018 Pharyngeal Phase Impaired Pharyngeal- Pudding Teaspoon -- Pharyngeal -- Pharyngeal- Pudding Cup -- Pharyngeal -- Pharyngeal- Honey Teaspoon -- Pharyngeal -- Pharyngeal- Honey Cup -- Pharyngeal -- Pharyngeal- Nectar Teaspoon -- Pharyngeal -- Pharyngeal- Nectar Cup Pharyngeal residue - valleculae;Reduced tongue base retraction;Reduced epiglottic inversion Pharyngeal -- Pharyngeal- Nectar Straw Delayed swallow initiation-vallecula;Pharyngeal residue - valleculae;Reduced epiglottic inversion;Reduced tongue base retraction Pharyngeal -- Pharyngeal- Thin Teaspoon Delayed swallow initiation-vallecula;Pharyngeal residue - valleculae;Reduced tongue base retraction;Reduced epiglottic inversion Pharyngeal -- Pharyngeal- Thin Cup Delayed swallow initiation-pyriform sinuses;Pharyngeal residue - valleculae;Reduced epiglottic inversion;Reduced tongue base retraction Pharyngeal -- Pharyngeal- Thin Straw Delayed swallow initiation-pyriform sinuses;Pharyngeal residue - valleculae;Penetration/Aspiration during swallow Pharyngeal Material enters airway, passes BELOW cords without attempt by patient to eject out (silent aspiration) Pharyngeal- Puree Delayed swallow initiation-vallecula;Pharyngeal residue - valleculae;Reduced epiglottic inversion;Reduced pharyngeal peristalsis Pharyngeal  -- Pharyngeal- Mechanical Soft -- Pharyngeal -- Pharyngeal- Regular Delayed swallow initiation-vallecula;Pharyngeal residue - valleculae;Reduced epiglottic inversion;Reduced tongue base retraction Pharyngeal -- Pharyngeal- Multi-consistency -- Pharyngeal -- Pharyngeal-  Pill -- Pharyngeal -- Pharyngeal Comment --  CHL IP CERVICAL ESOPHAGEAL PHASE 01/03/2018 Cervical Esophageal Phase Impaired Pudding Teaspoon -- Pudding Cup -- Honey Teaspoon -- Honey Cup -- Nectar Teaspoon -- Nectar Cup -- Nectar Straw -- Thin Teaspoon -- Thin Cup -- Thin Straw -- Puree -- Mechanical Soft -- Regular -- Multi-consistency -- Pill -- Cervical Esophageal Comment residuals noted at pyriform sinus, upon esophageal sweep - pt's esophagus was largely clear No flowsheet data found. Macario Golds 01/03/2018, 1:35 PM      Luanna Salk, [place] SLP [phone]          Scheduled Meds: . baclofen  5 mg Oral TID  . feeding supplement (GLUCERNA SHAKE)  237 mL Oral TID BM  . furosemide  20 mg Intravenous BID  . Gerhardt's butt cream   Topical QID  . insulin aspart  0-20 Units Subcutaneous TID AC & HS  . insulin glargine  25 Units Subcutaneous QHS  . lactobacillus  1 g Oral Q breakfast  . metoprolol tartrate  25 mg Oral BID  . OLANZapine  5 mg Oral QHS  . sodium chloride flush  5 mL Intracatheter Q12H   Continuous Infusions: . sodium chloride 250 mL (01/03/18 1629)  . fluconazole (DIFLUCAN) IV Stopped (01/04/18 1354)  . imipenem-cilastatin Stopped (01/04/18 1643)     LOS: 10 days    Time spent: Total of 15 minutes spent with pt, greater than 50% of which was spent in discussion of  treatment, counseling and coordination of care  Chipper Oman, MD Pager: Text Page via [email or web address]   If 7PM-7AM, please contact night-coverage [email or web address] 01/04/2018, 6:56 PM   Note - This record has been created using [employer]. Chart creation errors have been sought, but may not always have been located. Such creation errors do  not reflect on the standard of medical care.

## 2018-01-05 ENCOUNTER — Encounter (HOSPITAL_COMMUNITY): Payer: Self-pay

## 2018-01-05 LAB — GLUCOSE, CAPILLARY: Glucose-Capillary: 137 mg/dL — ABNORMAL HIGH (ref 65–99)

## 2018-01-05 MED ORDER — METOPROLOL SUCCINATE ER 25 MG PO TB24: 25.0000 mg | ORAL_TABLET | Freq: Two times a day (BID) | ORAL | Status: AC

## 2018-01-05 MED ORDER — HEPARIN SOD (PORK) LOCK FLUSH 100 UNIT/ML IV SOLN: 500.0000 [IU] | INTRAVENOUS | Status: DC | PRN

## 2018-01-05 MED ORDER — ONDANSETRON HCL 4 MG/2ML IJ SOLN: 4.0000 mg | Freq: Four times a day (QID) | INTRAMUSCULAR | 0 refills | Status: AC | PRN

## 2018-01-05 MED ORDER — MORPHINE SULFATE (PF) 4 MG/ML IV SOLN: 1.0000 mg | INTRAVENOUS | 0 refills | Status: AC | PRN

## 2018-01-05 NOTE — Progress Notes (Signed)
Clinical Social Worker facilitated patient discharge including contacting patient family and facility to confirm patient discharge plans.  Clinical information faxed to facility and family agreeable with plan.  CSW arranged ambulance transport via [facility] to [place] .  RN to call [phone] for report prior to discharge.  Clinical Social Worker will sign off for now as social work intervention is no longer needed. Please consult [place] again if new need arises.  Rhea Pink, MSW, [place]

## 2018-01-05 NOTE — Progress Notes (Signed)
[facility]: Spoke to [employer], Education officer, museum to arrange transfer. She will call for transport with tentative pick up time 10:30am for arrival at 11:00 am to our inpatient facility. Thank your for allowing [place] to participate in the care of this patient. Please call for any further needs. [phone]. Doroteo Glassman, RN, [employer]

## 2018-01-05 NOTE — Discharge Summary (Signed)
Physician Discharge Summary  Preston Moore  XQJ:[id number]  DOB: [birth year]  DOA: 12/25/2017 PCP: Gildardo Cranker, DO  Admit date: 12/25/2017 Discharge date: 01/05/2018  Admitted From: SNF  Disposition: Residential Hospice   Discharge Condition: Comfort care   CODE STATUS: DNR  Diet recommendation: NPO can give palliative feeds   Brief/Interim Summary: For full details see H&P/Progress note, but in brief, Preston Moore is a 84 year old male with significant medical history of systolic heart failure with EF of 35%, bedbound, type 2 diabetes mellitus, A. fib and hypertension presented to the emergency department with tachycardia. He is a nursing home resident was noted to have significant tachycardia, fever and hypotension and was sent to the ED for further evaluation. On initial exam patient was hemodynamically stable. Abdominal CT with several low-attenuation foci within the medial segment of the left lobe of the, continues to the gallbladder suspicion for small liver abscess liver subdiaphragmatic fluid collection was identified. Chest x-ray showed hyperinflated lungs. An EKG with A. fib with RVR.He was admitted with working diagnosis of sepsis due to liver abscess, acalculous cholecystitis with complicated acute renal failure and A. fib with RVR. Patient underwent percutaneous cholecystectomy and perihepatic fluid drainage on 12/26/17 by IR. Fluid collection reveal ESBL, ID was consulted and patient was started on Merrem.  Patient has continued to decline despite medical therapy, became fluid overload and now receiving Lasix.  Also severe deconditioning with malnutrition.  MBS shows severe risk of aspiration with severe dysphagia. Despite medical treatment an surgical intraversion patient continued to decline. Palliative care was consulted and family decide to made patient full comfort care. Patient was transitioned to hospice facility.   Subjective: Patient seen and examined, no new  complaints. He is comfortable in bed.   Discharge Diagnoses/Hospital Course:  Sepsis due to acalculous cholecystitis and liver abscess/enterococcus and ESBL E. coli Status post percutaneous cholecystectomy with JP drain of liver abscess. ID was consulted and recommended IV imipenem for at least 3 weeks. PICC line was placed for long-term antibiotics. Repeated CT scan from 3/20 discussed with IR, they do not see any fluid collection that needs to be drained at this moment and does not recommend any further interventions.  I discussed findings with surgical team which also is not recommending any surgical interventions.  Patient was continued on antibiotics but as he has been transitioned to comfort care, family opted to stop antibiotic therapy as well.  A. fib with RVR Heart rate well controlled, not on anti-correlation Continue comfort measures  Acute on chronic systolic heart failure with EF of 30-35% Secondary to aggressive hydration due to infectious process Was treated with IV Lasix with good diuresis, seems to be euvolemic upon discharge   Acute respiratory failure with hypoxia secondary to fluid overload. Treat as above Morphine if needed Continue O2 supplement as needed   AKI - resolved Creatinine at baseline Continue monitor closely  Oral candidiasis In setting of patient with type 2 diabetes mellitus Was treated with IV Diflucan, this has been d/ced given comfort measures   Type 2 diabetes mellitus D/c finger stick and Lantus  Comfort measures only   Hypertension BP stable on metoprolol 25 mg twice daily Continue to monitor  Dysphagia Likely due to deconditioning and oral thrush questionable if esophageal candidiasis as well. Patient was treated with diflucan  Modified barium swallow shows severe dysphagia and high risk of aspiration likely from deconditioning. SLP has recommended to keep patient n.p.o.   Discharge Instructions  You were cared for  by a  hospitalist during your hospital stay. If you have any questions about your discharge medications or the care you received while you were in the hospital after you are discharged, you can call the unit and asked to speak with the hospitalist on call if the hospitalist that took care of you is not available. Once you are discharged, your primary care physician will handle any further medical issues. Please note that NO REFILLS for any discharge medications will be authorized once you are discharged, as it is imperative that you return to your primary care physician (or establish a relationship with a primary care physician if you do not have one) for your aftercare needs so that they can reassess your need for medications and monitor your lab values.  Discharge Instructions    Call MD for:  difficulty breathing, headache or visual disturbances   Complete by:  As directed    Call MD for:  extreme fatigue   Complete by:  As directed    Call MD for:  hives   Complete by:  As directed    Call MD for:  persistant dizziness or light-headedness   Complete by:  As directed    Call MD for:  persistant nausea and vomiting   Complete by:  As directed    Call MD for:  redness, tenderness, or signs of infection (pain, swelling, redness, odor or green/yellow discharge around incision site)   Complete by:  As directed    Call MD for:  severe uncontrolled pain   Complete by:  As directed    Call MD for:  temperature >100.4   Complete by:  As directed    Diet - low sodium heart healthy   Complete by:  As directed    Increase activity slowly   Complete by:  As directed      Allergies as of 01/05/2018   No Known Allergies     Medication List    STOP taking these medications   acetaminophen 500 MG tablet Commonly known as:  TYLENOL   albuterol (2.5 MG/3ML) 0.083% nebulizer solution Commonly known as:  PROVENTIL   albuterol 0.63 MG/3ML nebulizer solution Commonly known as:  ACCUNEB   atorvastatin  10 MG tablet Commonly known as:  LIPITOR   benzonatate 200 MG capsule Commonly known as:  TESSALON   CALAZIME SKIN PROTECTANT EX   ceFEPime IVPB Commonly known as:  MAXIPIME   clindamycin in dextrose 5 % 50 mL   docusate sodium 100 MG capsule Commonly known as:  COLACE   eucerin cream   feeding supplement (PRO-STAT SUGAR FREE 64) Liqd   ferrous sulfate 325 (65 FE) MG tablet   gabapentin 300 MG capsule Commonly known as:  NEURONTIN   guaiFENesin 600 MG 12 hr tablet Commonly known as:  MUCINEX   insulin aspart 100 UNIT/ML injection Commonly known as:  novoLOG   insulin glargine 100 UNIT/ML injection Commonly known as:  LANTUS   ipratropium-albuterol 0.5-2.5 (3) MG/3ML Soln Commonly known as:  DUONEB   LEVAQUIN IV   lisinopril 2.5 MG tablet Commonly known as:  ZESTRIL   lisinopril 5 MG tablet Commonly known as:  PRINIVIL,ZESTRIL   multivitamin tablet   NUTRITIONAL SHAKE PO   ondansetron 4 MG tablet Commonly known as:  ZOFRAN Replaced by:  ondansetron 4 MG/2ML Soln injection   pantoprazole 40 MG tablet Commonly known as:  PROTONIX   polyethylene glycol packet Commonly known as:  MIRALAX / GLYCOLAX   potassium chloride SA  20 MEQ tablet Commonly known as:  K-DUR,KLOR-CON   PROBIOTIC DAILY Caps   promethazine 25 MG suppository Commonly known as:  PHENERGAN   sennosides-docusate sodium 8.6-50 MG tablet Commonly known as:  SENOKOT-S   traMADol 50 MG tablet Commonly known as:  ULTRAM   traZODone 100 MG tablet Commonly known as:  DESYREL   Vitamin D3 10000 units Tabs     TAKE these medications   furosemide 40 MG tablet Commonly known as:  LASIX Take 40 mg by mouth daily.   metoprolol succinate 25 MG 24 hr tablet Commonly known as:  TOPROL-XL Take 1 tablet (25 mg total) by mouth 2 (two) times daily. What changed:  when to take this   morphine 4 MG/ML injection Inject 0.25 mLs (1 mg total) into the vein every 2 (two) hours as needed for  severe pain.   ondansetron 4 MG/2ML Soln injection Commonly known as:  ZOFRAN Inject 2 mLs (4 mg total) into the vein every 6 (six) hours as needed for nausea. Replaces:  ondansetron 4 MG tablet      Contact information for after-discharge care    Destination    [facility] .   Service:  Skilled Nursing Contact information: [address] [place] [phone]             No Known Allergies  Consultations:  Gen Surg  IR  Palliative  Care   Procedures/Studies: Dg Chest 2 View  Result Date: 12/25/2017 CLINICAL DATA:  History pneumonia.  Cough and congestion. EXAM: CHEST - 2 VIEW COMPARISON:  11/14/2017. FINDINGS: Cardiomegaly with pulmonary venous congestion again noted. Bilateral interstitial prominence noted. Findings suggest CHF. Pneumonitis cannot be excluded. Small bilateral pleural effusions cannot be excluded. No pneumothorax. IMPRESSION: Cardiomegaly with pulmonary venous congestion bilateral interstitial prominence suggesting mild CHF. Small bilateral pleural effusions may be present. Electronically Signed   By: Marcello Moores  Register   On: 12/25/2017 10:38   Ct Head Wo Contrast  Result Date: 12/25/2017 CLINICAL DATA:  84 year old male with altered mental status. Recent pneumonia. EXAM: CT HEAD WITHOUT CONTRAST TECHNIQUE: Contiguous axial images were obtained from the base of the skull through the vertex without intravenous contrast. COMPARISON:  Head CT without contrast 11/15/2017 and earlier. FINDINGS: Brain: Stable cerebral volume. Stable ventricle size and configuration since 2013. Small chronic lacunar infarct in the inferior left cerebellum is stable. Patchy supratentorial cerebral white matter hypodensity is stable. No midline shift, mass effect, evidence of mass lesion, intracranial hemorrhage or evidence of cortically based acute infarction. Vascular: Calcified atherosclerosis at the skull base. No suspicious  intracranial vascular hyperdensity. Skull: No acute osseous abnormality identified. Sinuses/Orbits: Improved sinus aeration since January. Residual sphenoid sinus mucosal thickening. Bilateral tympanic cavities and mastoids are clear. Other: No acute orbit or scalp soft tissue findings. IMPRESSION: 1. No acute intracranial abnormality. Stable non contrast CT appearance of the brain since January. 2. Improved sinus aeration since January with residual sphenoid sinus disease. Electronically Signed   By: Genevie Ann M.D.   On: 12/25/2017 12:33   Nm Hepatobiliary Liver Func  Result Date: 12/26/2017 CLINICAL DATA:  Gallbladder wall thickening and sludge with right upper quadrant abdominal pain EXAM: NUCLEAR MEDICINE HEPATOBILIARY IMAGING TECHNIQUE: Sequential images of the abdomen were obtained out to 60 minutes following intravenous administration of radiopharmaceutical. RADIOPHARMACEUTICALS:  5.5 mCi Tc-72m  Choletec IV COMPARISON:  Abdominal ultrasound 12/25/2017 FINDINGS: Satisfactory uptake of radiopharmaceutical from the blood pool. Biliary and bowel activity  by 5 minutes. Initial non filling of the gallbladder prompted IV administration of 4 mg of morphine. Subsequent imaging over the next 30 minutes demonstrates vague activity in the area of the gallbladder fossa but no definitive gallbladder filling. IMPRESSION: 1. Lack of definitive filling of the gallbladder on hepatobiliary scan despite morphine administration. Appearance is suspicious for lack of patency of the cystic duct and cholecystitis. Electronically Signed   By: Van Clines M.D.   On: 12/26/2017 12:44   Ct Abdomen Pelvis W Contrast  Result Date: 01/01/2018 CLINICAL DATA:  84 year old male with acalculous cholecystitis and liver abscess post drainage. Subsequent encounter. EXAM: CT ABDOMEN AND PELVIS WITH CONTRAST TECHNIQUE: Multidetector CT imaging of the abdomen and pelvis was performed using the standard protocol following bolus  administration of intravenous contrast. CONTRAST:  128mL ISOVUE-300 IOPAMIDOL (ISOVUE-300) INJECTION 61% COMPARISON:  12/26/2017 drainage films.  12/25/2017 pre drainage CT. FINDINGS: Lower chest: Interval increase in size of right-sided pleural effusion (now moderate in size) which may be loculated. Small left-sided pleural effusion. Bibasilar consolidation may represent atelectasis or infiltrate. Mild cardiomegaly. Coronary artery calcifications. Mitral and aortic valve calcifications. Hepatobiliary: Cholecystostomy tube has been place with decompression of gallbladder. Decrease in size of small abscesses within the liver adjacent to the gallbladder fossa largest measuring up to 1 cm versus prior 1.5 cm. Fluid collection along the anterior margin of the liver of similar size to slightly larger measuring 3.1 x 1.2 x 3.8 cm versus prior 3.1 x 1.4 by 3.8 cm. Pancreas: No pancreatic mass or primary pancreatic inflammation. Spleen: No splenic mass or enlargement. Adrenals/Urinary Tract: No obstructing stone or hydronephrosis. No worrisome renal or adrenal lesion. Noncontrast filled imaging of urinary bladder without gross abnormality although evaluation limited. Stomach/Bowel: No primary bowel inflammatory process. Vascular/Lymphatic: Atherosclerotic changes of tortuous aorta. Atherosclerotic changes aortic branch vessels. No abdominal aortic aneurysm or large vessel occlusion. Prominent narrowing origin superior mesenteric artery and right renal artery. No adenopathy. Reproductive: Prostate gland top-normal size. Other: Drain placed within the anterior right subphrenic abscess which has decreased significantly in size with a residual thickness of abscess measuring 1.4 cm versus prior 2.2 cm. Interval increase in size of left subphrenic collection now with maximal thickness of 1.6 cm versus prior 0.8 cm. Musculoskeletal: L1 superior endplate compression fracture once again noted and without change from recent exam.  Degenerative changes L3-4 through L5-S1. Bilateral hip surgery with degenerative changes. IMPRESSION: Cholecystostomy tube has been place with decompression of gallbladder. Decrease in size of small abscesses within the liver adjacent to the gallbladder fossa largest measuring up to 1 cm versus prior 1.5 cm. Fluid collection along the anterior margin of the liver of similar size to slightly larger measuring 3.1 x 1.2 x 3.8 cm versus prior 3.1 x 1.4 by 3.8 cm. Drain placed within the anterior right subphrenic abscess which has decreased significantly in size with a residual thickness of abscess measuring 1.4 cm versus prior 2.2 cm. Interval increase in size of left subphrenic collection now with maximal thickness of 1.6 cm versus prior 0.8 cm. Interval increase in size of right-sided pleural effusion (now moderate in size) and may be loculated. Small left-sided pleural effusion. Bibasilar consolidation may represent atelectasis or infiltrate. Aortic Atherosclerosis (ICD10-I70.0). Prominent narrowing origin superior mesenteric artery and right renal artery. Coronary artery calcification. L1 superior endplate compression fracture similar to recent CT. Electronically Signed   By: Genia Del M.D.   On: 01/01/2018 17:44   Ct Abdomen Pelvis W Contrast  Result Date: 12/25/2017  CLINICAL DATA:  Abdominal distention. EXAM: CT ABDOMEN AND PELVIS WITH CONTRAST TECHNIQUE: Multidetector CT imaging of the abdomen and pelvis was performed using the standard protocol following bolus administration of intravenous contrast. CONTRAST:  24mL ISOVUE-300 IOPAMIDOL (ISOVUE-300) INJECTION 61% COMPARISON:  02/23/2012 FINDINGS: Lower chest: Airspace opacities are identified within both lower lobes medially. Small right pleural effusion identified. Hepatobiliary: Low-attenuation fluid foci within the medial segment of left lobe of liver appear contiguous with the gallbladder, image 14/2. These measure up to 1.7 cm and are suspicious for  small liver abscesses. Moderate distension of the gallbladder. There may be a stone within the gallbladder measuring approximately 2 cm. No biliary dilatation. Pancreas: Unremarkable. No pancreatic ductal dilatation or surrounding inflammatory changes. Spleen: Normal in size without focal abnormality. Adrenals/Urinary Tract: The adrenal glands appear normal. Unremarkable appearance of the kidneys. The urinary bladder is negative. Stomach/Bowel: The stomach is normal. No abnormal dilatation of the small bowel loops. The appendix is visualized and appears normal. No pathologic dilatation of the colon. Vascular/Lymphatic: Aortic atherosclerosis noted. No aneurysm. No upper abdominal adenopathy identified. No pelvic or inguinal adenopathy. Reproductive: Prostate is unremarkable. Other: Peripherally enhancing fluid collection along the undersurface of the right lobe of liver measures 5.2 by 2.6 by 4.7 cm. Suspicious for subdiaphragmatic abscess. A small volume of perisplenic fluid is noted within the left upper quadrant of the abdomen between the spleen and left hemidiaphragm. Musculoskeletal: The bones appear diffusely osteopenic. There is degenerative disc disease identified within the lumbar spine. There is a compression fracture involving the L1 vertebra which is new from CT dated 02/23/2012. IMPRESSION: 1. Several low-attenuation foci within the medial segment of left lobe of liver appear contiguous with the gallbladder and are suspicious for small liver abscesses. 2. Given the suspicious changes within the medial segment of left lobe of liver cannot rule out gallbladder inflammation/infection. Less likely would be a gallbladder neoplasm which is invading the adjacent liver. As a next step, further evaluation with right upper quadrant gallbladder ultrasound is advised to further evaluate acute cholecystitis versus mass. 3. Sub diaphragmatic fluid collection is identified and is suspicious for abscess. 4. Small  perisplenic fluid collection. Indeterminate. Cannot rule out abscess. 5. Bilateral lower lobe airspace densities compatible with pneumonia. 6.  Aortic Atherosclerosis (ICD10-I70.0). 7. These results were called by telephone at the time of interpretation on 12/25/2017 at 12:51 pm to Dr. Vanita Panda, who verbally acknowledged these results. Electronically Signed   By: Kerby Moors M.D.   On: 12/25/2017 12:52   Ir Perc Cholecystostomy  Result Date: 12/26/2017 INDICATION: Sepsis and imaging findings consistent with acute cholecystitis. The patient is not a candidate for cholecystectomy currently and request has been made to place a percutaneous cholecystostomy tube. There is an additional fluid collection anterior to the liver suspicious by CT for additional perihepatic abscess. EXAM: 1. PERCUTANEOUS CHOLECYSTOSTOMY TUBE PLACEMENT 2. PERCUTANEOUS CATHETER DRAINAGE OF PERIHEPATIC PERITONEAL ABSCESS ANESTHESIA/SEDATION: Moderate (conscious) sedation was employed during this procedure. A total of Versed 2.0 mg and Fentanyl 100 mcg was administered intravenously. Moderate Sedation Time: 25 minutes. The patient's level of consciousness and vital signs were monitored continuously by radiology nursing throughout the procedure under my direct supervision. MEDICATIONS: No additional medications. FLUOROSCOPY TIME:  Fluoroscopy Time: 54 seconds.  29.3 mGy. COMPLICATIONS: None immediate. PROCEDURE: Informed written consent was obtained from the patient after a thorough discussion of the procedural risks, benefits and alternatives. All questions were addressed. Maximal Sterile Barrier Technique was utilized including caps, mask, sterile gowns, sterile gloves,  sterile drape, hand hygiene and skin antiseptic. A timeout was performed prior to the initiation of the procedure. The gallbladder was localized by ultrasound. Under ultrasound guidance, a 21 gauge needle was advanced into the gallbladder lumen. Bile was aspirated from the  gallbladder lumen. A small amount of contrast material was injected. A guidewire was advanced. A transitional dilator was placed. The tract was dilated and a 10 French percutaneous drainage catheter advanced. A bile sample was sent for culture analysis. The catheter was connected to gravity bag drainage. It was secured at the skin with a Prolene retention suture and StatLock device. The anterior perihepatic abscess was localized by ultrasound. Under ultrasound guidance, a 5 [place] Yueh centesis catheter was advanced into the collection. Aspiration was performed and a fluid sample sent for culture analysis. The catheter was removed over a guidewire. The tract was dilated and a 10 French percutaneous drainage catheter placed. The catheter was connected to suction bulb drainage. It was secured at the skin with a Prolene retention suture and StatLock device. FINDINGS: By ultrasound the gallbladder is distended, contains multiple calculi and demonstrates wall thickening and inflammation. Aspiration yielded grossly purulent fluid from the gallbladder lumen. A 10 French cholecystostomy tube was placed and is draining well after placement. Aspiration at the level of the anterior perihepatic abscess yielded cloudy, thin yellow fluid. Given nature of fluid, an additional 10 [place] drain was placed in this collection. IMPRESSION: 1. Grossly purulent bile in the gallbladder lumen consistent with cholecystitis. A 10 French cholecystostomy tube was placed and attached to gravity bag drainage. 2. Anterior perihepatic fluid collection yielded cloudy yellow fluid. A 10 French percutaneous drainage catheter was placed in this collection and attached to suction bulb drainage. Electronically Signed   By: Aletta Edouard M.D.   On: 12/26/2017 17:32   Dg Chest Port 1 View  Result Date: 01/03/2018 CLINICAL DATA:  Aspiration.  Smoker. EXAM: PORTABLE CHEST 1 VIEW COMPARISON:  01/01/2018 FINDINGS: Interval significant patient rotation to  the left. No gross change in enlargement of the cardiac silhouette increased right lung opacity and pleural fluid. Decreased left basilar patchy opacity. Thoracic spine degenerative changes. Two pigtail drainage catheters overlying the right lung base and right upper abdomen. Barium in the stomach. Left PICC tip poorly visualized, not grossly changed. IMPRESSION: 1. Increased right pleural fluid and right lung atelectasis, pneumonia or pneumonitis. 2. Mildly improved left basilar pneumonia or aspiration pneumonitis. Electronically Signed   By: Claudie Revering M.D.   On: 01/03/2018 16:46   Dg Chest Port 1 View  Result Date: 01/01/2018 CLINICAL DATA:  Follow-up pneumonia, short of breath EXAM: PORTABLE CHEST 1 VIEW COMPARISON:  12/26/2017, 12/25/2017, 11/14/2017 FINDINGS: Left upper extremity catheter tip overlies the proximal right atrium. Development of small right-sided pleural effusion and probable small left effusion. Cardiomegaly with vascular congestion and mild pulmonary edema. Atherosclerosis of the aorta. No pneumothorax. Drainage catheters in the right upper quadrant. IMPRESSION: 1. Left upper extremity catheter tip overlies the proximal right atrium 2. Development of small moderate right pleural effusion, likely layering and attributing to asymmetric hazy opacity in the right thorax 3. Cardiomegaly with vascular congestion and mild pulmonary edema. Probable small left effusion. Electronically Signed   By: Donavan Foil M.D.   On: 01/01/2018 18:27   Dg Chest Port 1 View  Result Date: 12/26/2017 CLINICAL DATA:  Cough and congestion EXAM: PORTABLE CHEST 1 VIEW COMPARISON:  December 25, 2016 FINDINGS: There is airspace consolidation in the left base and right mid lung  regions. These changes are similar to 1 day prior. There appears to be a degree of underlying interstitial edema. There is cardiomegaly. The pulmonary vascularity is within normal limits. There is aortic atherosclerosis. No adenopathy. There is  degenerative change in the thoracic spine. There is calcification in the right carotid artery. IMPRESSION: Cardiomegaly with underlying interstitial edema. Areas of airspace consolidation in the left base and right mid lung may represent alveolar edema or pneumonia. Both entities may be present concurrently. Appearance similar 1 day prior. There is aortic atherosclerosis as well as calcification in right carotid artery. Aortic Atherosclerosis (ICD10-I70.0). Electronically Signed   By: Lowella Grip III M.D.   On: 12/26/2017 07:15   Dg Swallowing Func-speech Pathology  Result Date: 01/03/2018 Objective Swallowing Evaluation: Type of Study: MBS-Modified Barium Swallow Study  Patient Details Name: Preston Moore MRN: [id number] Date of Birth: 18-Dec-1933 Today's Date: 01/03/2018 Time: SLP Start Time (ACUTE ONLY): 1258 -SLP Stop Time (ACUTE ONLY): 1329 SLP Time Calculation (min) (ACUTE ONLY): 31 min Past Medical History: Past Medical History: Diagnosis Date . Atrial flutter ([place])  . B12 deficiency 02/22/2012 . Closed right hip fracture, initial encounter ([place]) 04/06/2017 . Diabetes mellitus  . Hyperlipidemia  . Hypertension  . Ileus ([place]) 02/29/2012 . Malnutrition ([place]) 02/29/2012 . Neuropathy  . Non compliance with medical treatment 02/29/2012 . Pancreatitis 02/22/2012 . SBO (small bowel obstruction) ([place]) 02/23/2012 . Syncope and collapse  Past Surgical History: Past Surgical History: Procedure Laterality Date . BACK SURGERY   . COMPRESSION HIP SCREW Right 04/07/2017  Procedure: RIGHT HIP INTRATROCHANTERIC NAILING;  Surgeon: [name], MD;  Location: [facility];  Service: Orthopedics;  Laterality: Right; . IR IMAGE GUIDED DRAINAGE PERCUT CATH  PERITONEAL RETROPERIT  12/26/2017 . IR PERC CHOLECYSTOSTOMY  12/26/2017 . LAMINECTOMY  1963 HPI: 84 yo male adm to [facility] hospital - required cholecystotomy drain - PMH + for closed hip fx and has been bed bound since per chart.  Pt found to have pna and has problems swallowing. MD  ordered swallow evaluation.  Daughter reports pt fed himself prior to admission but since admitted, he is weak and does not want to eat/drink much. She states he will take a bite or few sips and then he does not want anymore intake.  Family friend Rosann Auerbach reported pt has not been as interactive over the last month as normal.  Pt noted to have oral candidiasis yesterday and diet modified to full liquids yesterday due to oral holding.   Subjective: pt awake in chair Assessment / Plan / Recommendation CHL IP CLINICAL IMPRESSIONS 01/03/2018 Clinical Impression Delayed oral transiting/control noted with pt lingual rocking  - resulting in pt essentially spilling boluses into pharynx poorly controlled.  In addition, liquid boluses accumulate at pyriform sinus resulting in aspiration of thin liquids during the swallow.  Pt did NOT sense aspiration nor did he cough per verbal/visual cues to attempt to clear.  Pt is weak and subsequently oropharyngeal residuals are present - pt unable to elicit dry swallows or "hock" to propel base of tongue/vallecular residuals into oral cavity.  Pt is high aspiration and malnutrition due to current level of dysphagia, mentation and weakness.  Educated daughter Pamala Hurry to findings - including concerns for aspiration and malnutrition given lack of functional swallow using teach back and video picture.  Will follow up with family/pt.  Note palliative following but care plan is not full comfort at this time.     SLP Visit Diagnosis Dysphagia, oropharyngeal phase (R13.12) Attention and concentration  deficit following -- Frontal lobe and executive function deficit following -- Impact on safety and function Moderate aspiration risk;Severe aspiration risk   CHL IP TREATMENT RECOMMENDATION 01/03/2018 Treatment Recommendations Therapy as outlined in treatment plan below   Prognosis 01/03/2018 Prognosis for Safe Diet Advancement Guarded Barriers to Reach Goals Severity of deficits Barriers/Prognosis  Comment -- CHL IP DIET RECOMMENDATION 01/03/2018 SLP Diet Recommendations NPO except meds;Ice chips PRN after oral care;NPO Liquid Administration via -- Medication Administration Whole meds with puree Compensations -- Postural Changes Seated upright at 90 degrees;Remain semi-upright after after feeds/meals (Comment)   CHL IP OTHER RECOMMENDATIONS 01/03/2018 Recommended Consults -- Oral Care Recommendations Oral care QID Other Recommendations --   CHL IP FOLLOW UP RECOMMENDATIONS 01/03/2018 Follow up Recommendations (No Data)   CHL IP FREQUENCY AND DURATION 01/03/2018 Speech Therapy Frequency (ACUTE ONLY) min 1 x/week Treatment Duration 1 week      CHL IP ORAL PHASE 01/03/2018 Oral Phase Impaired Oral - Pudding Teaspoon -- Oral - Pudding Cup -- Oral - Honey Teaspoon -- Oral - Honey Cup -- Oral - Nectar Teaspoon Delayed oral transit;Decreased bolus cohesion;Weak lingual manipulation;Reduced posterior propulsion;Premature spillage;Holding of bolus;Lingual pumping Oral - Nectar Cup Delayed oral transit;Decreased bolus cohesion;Weak lingual manipulation;Premature spillage;Piecemeal swallowing;Holding of bolus;Lingual pumping Oral - Nectar Straw Delayed oral transit;Decreased bolus cohesion;Weak lingual manipulation;Premature spillage;Piecemeal swallowing;Holding of bolus;Lingual pumping Oral - Thin Teaspoon Weak lingual manipulation;Delayed oral transit;Decreased bolus cohesion;Premature spillage;Piecemeal swallowing;Holding of bolus;Lingual pumping Oral - Thin Cup -- Oral - Thin Straw Weak lingual manipulation;Premature spillage;Decreased bolus cohesion;Reduced posterior propulsion;Holding of bolus;Piecemeal swallowing;Lingual pumping Oral - Puree Decreased bolus cohesion;Lingual pumping;Weak lingual manipulation;Premature spillage;Reduced posterior propulsion;Holding of bolus;Piecemeal swallowing Oral - Mech Soft -- Oral - Regular Decreased bolus cohesion;Holding of bolus;Impaired mastication;Delayed oral transit;Weak  lingual manipulation;Lingual pumping;Reduced posterior propulsion;Piecemeal swallowing;Premature spillage Oral - Multi-Consistency -- Oral - Pill -- Oral Phase - Comment --  CHL IP PHARYNGEAL PHASE 01/03/2018 Pharyngeal Phase Impaired Pharyngeal- Pudding Teaspoon -- Pharyngeal -- Pharyngeal- Pudding Cup -- Pharyngeal -- Pharyngeal- Honey Teaspoon -- Pharyngeal -- Pharyngeal- Honey Cup -- Pharyngeal -- Pharyngeal- Nectar Teaspoon -- Pharyngeal -- Pharyngeal- Nectar Cup Pharyngeal residue - valleculae;Reduced tongue base retraction;Reduced epiglottic inversion Pharyngeal -- Pharyngeal- Nectar Straw Delayed swallow initiation-vallecula;Pharyngeal residue - valleculae;Reduced epiglottic inversion;Reduced tongue base retraction Pharyngeal -- Pharyngeal- Thin Teaspoon Delayed swallow initiation-vallecula;Pharyngeal residue - valleculae;Reduced tongue base retraction;Reduced epiglottic inversion Pharyngeal -- Pharyngeal- Thin Cup Delayed swallow initiation-pyriform sinuses;Pharyngeal residue - valleculae;Reduced epiglottic inversion;Reduced tongue base retraction Pharyngeal -- Pharyngeal- Thin Straw Delayed swallow initiation-pyriform sinuses;Pharyngeal residue - valleculae;Penetration/Aspiration during swallow Pharyngeal Material enters airway, passes BELOW cords without attempt by patient to eject out (silent aspiration) Pharyngeal- Puree Delayed swallow initiation-vallecula;Pharyngeal residue - valleculae;Reduced epiglottic inversion;Reduced pharyngeal peristalsis Pharyngeal -- Pharyngeal- Mechanical Soft -- Pharyngeal -- Pharyngeal- Regular Delayed swallow initiation-vallecula;Pharyngeal residue - valleculae;Reduced epiglottic inversion;Reduced tongue base retraction Pharyngeal -- Pharyngeal- Multi-consistency -- Pharyngeal -- Pharyngeal- Pill -- Pharyngeal -- Pharyngeal Comment --  CHL IP CERVICAL ESOPHAGEAL PHASE 01/03/2018 Cervical Esophageal Phase Impaired Pudding Teaspoon -- Pudding Cup -- Honey Teaspoon -- Honey  Cup -- Nectar Teaspoon -- Nectar Cup -- Nectar Straw -- Thin Teaspoon -- Thin Cup -- Thin Straw -- Puree -- Mechanical Soft -- Regular -- Multi-consistency -- Pill -- Cervical Esophageal Comment residuals noted at pyriform sinus, upon esophageal sweep - pt's esophagus was largely clear No flowsheet data found. Macario Golds 01/03/2018, 1:35 PM      Luanna Salk, [facility] SLP [phone]         Ir Image  Guided Drainage Percut Cath  Peritoneal Retroperit  Result Date: 12/26/2017 INDICATION: Sepsis and imaging findings consistent with acute cholecystitis. The patient is not a candidate for cholecystectomy currently and request has been made to place a percutaneous cholecystostomy tube. There is an additional fluid collection anterior to the liver suspicious by CT for additional perihepatic abscess. EXAM: 1. PERCUTANEOUS CHOLECYSTOSTOMY TUBE PLACEMENT 2. PERCUTANEOUS CATHETER DRAINAGE OF PERIHEPATIC PERITONEAL ABSCESS ANESTHESIA/SEDATION: Moderate (conscious) sedation was employed during this procedure. A total of Versed 2.0 mg and Fentanyl 100 mcg was administered intravenously. Moderate Sedation Time: 25 minutes. The patient's level of consciousness and vital signs were monitored continuously by radiology nursing throughout the procedure under my direct supervision. MEDICATIONS: No additional medications. FLUOROSCOPY TIME:  Fluoroscopy Time: 54 seconds.  29.3 mGy. COMPLICATIONS: None immediate. PROCEDURE: Informed written consent was obtained from the patient after a thorough discussion of the procedural risks, benefits and alternatives. All questions were addressed. Maximal Sterile Barrier Technique was utilized including caps, mask, sterile gowns, sterile gloves, sterile drape, hand hygiene and skin antiseptic. A timeout was performed prior to the initiation of the procedure. The gallbladder was localized by ultrasound. Under ultrasound guidance, a 21 gauge needle was advanced into the gallbladder lumen. Bile  was aspirated from the gallbladder lumen. A small amount of contrast material was injected. A guidewire was advanced. A transitional dilator was placed. The tract was dilated and a 10 French percutaneous drainage catheter advanced. A bile sample was sent for culture analysis. The catheter was connected to gravity bag drainage. It was secured at the skin with a Prolene retention suture and StatLock device. The anterior perihepatic abscess was localized by ultrasound. Under ultrasound guidance, a 5 [place] Yueh centesis catheter was advanced into the collection. Aspiration was performed and a fluid sample sent for culture analysis. The catheter was removed over a guidewire. The tract was dilated and a 10 French percutaneous drainage catheter placed. The catheter was connected to suction bulb drainage. It was secured at the skin with a Prolene retention suture and StatLock device. FINDINGS: By ultrasound the gallbladder is distended, contains multiple calculi and demonstrates wall thickening and inflammation. Aspiration yielded grossly purulent fluid from the gallbladder lumen. A 10 French cholecystostomy tube was placed and is draining well after placement. Aspiration at the level of the anterior perihepatic abscess yielded cloudy, thin yellow fluid. Given nature of fluid, an additional 10 [place] drain was placed in this collection. IMPRESSION: 1. Grossly purulent bile in the gallbladder lumen consistent with cholecystitis. A 10 French cholecystostomy tube was placed and attached to gravity bag drainage. 2. Anterior perihepatic fluid collection yielded cloudy yellow fluid. A 10 French percutaneous drainage catheter was placed in this collection and attached to suction bulb drainage. Electronically Signed   By: Aletta Edouard M.D.   On: 12/26/2017 17:32   [place] Ekg Site Rite  Result Date: 12/31/2017 If Site Rite image not attached, placement could not be confirmed due to current cardiac rhythm.  US Abdomen Limited  Ruq  Result Date: 12/25/2017 CLINICAL DATA:  Abnormal appearance of gallbladder on CT EXAM: ULTRASOUND ABDOMEN LIMITED RIGHT UPPER QUADRANT COMPARISON:  CT abdomen and pelvis December 25, 2017 FINDINGS: Gallbladder: Sludge is seen throughout the gallbladder. No well-defined gallstones are delineated by ultrasound. Gallbladder wall appears thickened. No pericholecystic fluid evident. No sonographic Murphy sign noted by sonographer. Common bile duct: Diameter: 3 mm. No intrahepatic or extrahepatic biliary duct dilatation. Liver: Liver not completely visualized due to shadowing from apparent gas. Liver echogenicity appears  overall increased. No focal liver lesions are evident on this study. Portal vein is patent on color Doppler imaging with normal direction of blood flow towards the liver. IMPRESSION: 1. Gallbladder wall appears mildly thickened with sludge in the gallbladder. No gallstones evident. A degree of acalculus cholecystitis is questioned. In this regard, it may be prudent to consider nuclear medicine hepatobiliary imaging study to assess for cystic duct patency. 2. Portions of liver not well visualized due to apparent gas. Findings on CT in the periphery of the liver are not appreciable on this sonographic evaluation. The overall echogenicity of the visualized portions of liver appear increased which may indicate a degree of underlying hepatic steatosis. The sensitivity of ultrasound for detection of focal liver lesions is diminished significantly in this circumstance. Electronically Signed   By: Lowella Grip III M.D.   On: 12/25/2017 13:33    Discharge Exam: Vitals:   01/05/18 0552 01/05/18 0933  BP: 108/67 100/62  Pulse: (!) 103 100  Resp: 18   Temp: 98.1 F (36.7 C)   SpO2: 94%    Vitals:   01/04/18 2307 01/05/18 0500 01/05/18 0552 01/05/18 0933  BP: 96/64  108/67 100/62  Pulse: (!) 116  (!) 103 100  Resp: 20  18   Temp: 97.6 F (36.4 C)  98.1 F (36.7 C)   TempSrc: Oral  Oral    SpO2: 97%  94%   Weight:  94.1 kg (207 lb 7.3 oz)    Height:        General: Pt is alert, awake, not in acute distress Cardiovascular: RRR, S1/S2 +, no rubs, no gallops Respiratory: CTA bilaterally, no wheezing, no rhonchi   The results of significant diagnostics from this hospitalization (including imaging, microbiology, ancillary and laboratory) are listed below for reference.     Microbiology: Recent Results (from the past 240 hour(s))  Aerobic/Anaerobic Culture (surgical/deep wound)     Status: None   Collection Time: 12/26/17  4:46 PM  Result Value Ref Range Status   Specimen Description   Final    ABSCESS PERIHEPATIC Performed at [facility] [address], [place], [place] [address]    Special Requests NONE SAMPLE B  Final   Gram Stain   Final    ABUNDANT WBC PRESENT, PREDOMINANTLY PMN RARE GRAM POSITIVE COCCI    Culture   Final    RARE ENTEROCOCCUS FAECALIS RARE ESCHERICHIA COLI MODERATE BACTEROIDES FRAGILIS BETA LACTAMASE POSITIVE Confirmed Extended Spectrum Beta-Lactamase Producer (ESBL).  In bloodstream infections from ESBL organisms, carbapenems are preferred over piperacillin/tazobactam. They are shown to have a lower risk of mortality. Performed at [facility], [place] [address], [place], [place] [address]    Report Status 01/03/2018 FINAL  Final   Organism ID, Bacteria ENTEROCOCCUS FAECALIS  Final   Organism ID, Bacteria ESCHERICHIA COLI  Final      Susceptibility   Escherichia coli - MIC*    AMPICILLIN >=32 RESISTANT Resistant     CEFAZOLIN >=64 RESISTANT Resistant     CEFEPIME RESISTANT Resistant     CEFTAZIDIME RESISTANT Resistant     CEFTRIAXONE RESISTANT Resistant     CIPROFLOXACIN 1 SENSITIVE Sensitive     GENTAMICIN <=1 SENSITIVE Sensitive     IMIPENEM <=0.25 SENSITIVE Sensitive     TRIMETH/SULFA >=320 RESISTANT Resistant     AMPICILLIN/SULBACTAM 16 INTERMEDIATE Intermediate     PIP/TAZO <=4 SENSITIVE Sensitive      Extended ESBL POSITIVE Resistant     * RARE ESCHERICHIA  COLI   Enterococcus faecalis - MIC*    AMPICILLIN <=2 SENSITIVE Sensitive     VANCOMYCIN 1 SENSITIVE Sensitive     GENTAMICIN SYNERGY SENSITIVE Sensitive     * RARE ENTEROCOCCUS FAECALIS  Aerobic/Anaerobic Culture (surgical/deep wound)     Status: None   Collection Time: 12/26/17  4:46 PM  Result Value Ref Range Status   Specimen Description GALL BLADDER  Final   Special Requests  SAMPLE A  Final   Gram Stain   Final    ABUNDANT WBC PRESENT, PREDOMINANTLY PMN FEW GRAM POSITIVE COCCI FEW GRAM NEGATIVE RODS    Culture   Final    ABUNDANT ENTEROCOCCUS FAECALIS ABUNDANT ESCHERICHIA COLI Confirmed Extended Spectrum Beta-Lactamase Producer (ESBL).  In bloodstream infections from ESBL organisms, carbapenems are preferred over piperacillin/tazobactam. They are shown to have a lower risk of mortality. ABUNDANT BACTEROIDES FRAGILIS BETA LACTAMASE POSITIVE Performed at [facility], [place] [address], [place], [place] [address]    Report Status 12/31/2017 FINAL  Final   Organism ID, Bacteria ENTEROCOCCUS FAECALIS  Final   Organism ID, Bacteria ESCHERICHIA COLI  Final      Susceptibility   Escherichia coli - MIC*    AMPICILLIN >=32 RESISTANT Resistant     CEFAZOLIN >=64 RESISTANT Resistant     CEFEPIME RESISTANT Resistant     CEFTAZIDIME RESISTANT Resistant     CEFTRIAXONE RESISTANT Resistant     CIPROFLOXACIN 1 SENSITIVE Sensitive     GENTAMICIN <=1 SENSITIVE Sensitive     IMIPENEM <=0.25 SENSITIVE Sensitive     TRIMETH/SULFA >=320 RESISTANT Resistant     AMPICILLIN/SULBACTAM >=32 RESISTANT Resistant     PIP/TAZO <=4 SENSITIVE Sensitive     Extended ESBL POSITIVE Resistant     * ABUNDANT ESCHERICHIA COLI   Enterococcus faecalis - MIC*    AMPICILLIN <=2 SENSITIVE Sensitive     VANCOMYCIN 1 SENSITIVE Sensitive     GENTAMICIN SYNERGY SENSITIVE Sensitive     * ABUNDANT ENTEROCOCCUS FAECALIS  Culture, blood (routine x 2)      Status: None   Collection Time: 12/28/17  9:53 AM  Result Value Ref Range Status   Specimen Description   Final    BLOOD RIGHT ANTECUBITAL Performed at [facility] [address] [place], [place] [address]    Special Requests   Final    BOTTLES DRAWN AEROBIC ONLY Blood Culture adequate volume Performed at [facility] [address] [place], [place] [address]    Culture   Final    NO GROWTH 5 DAYS Performed at [facility], [place] [address], [place], [place] [address]    Report Status 01/02/2018 FINAL  Final  Culture, blood (routine x 2)     Status: None   Collection Time: 12/28/17 10:21 AM  Result Value Ref Range Status   Specimen Description   Final    BLOOD RIGHT HAND Performed at [facility] [address] [place], [place] [address]    Special Requests   Final    BOTTLES DRAWN AEROBIC ONLY Blood Culture adequate volume Performed at [facility] [address] [place], [place] [address]    Culture   Final    NO GROWTH 5 DAYS Performed at [facility], [place] [address] [place], [place] [address]    Report Status 01/02/2018 FINAL  Final     Labs: BNP (last 3 results) Recent Labs    12/25/17 1121 01/01/18 2302  BNP 283.8* 423.5*   Basic Metabolic Panel: Recent Labs  Lab 12/30/17 0640 01/01/18 0352 01/02/18 0500 01/03/18 0502 01/04/18 0432  NA 135 135 132* 134* 137  K 4.1 3.9 5.1 4.1 3.7  CL 101 100* 98* 98* 100*  CO2 24 24 24 26 27   GLUCOSE 176* 172* 185* 217* 142*  BUN 20 30* 36* 38* 35*  CREATININE 1.23 1.10 1.12 1.18 1.23  CALCIUM 7.9* 8.0* 7.8* 8.3* 8.3*  MG  --   --  1.8  --   --    Liver Function Tests: No results for input(s): AST, ALT, ALKPHOS, BILITOT, PROT, ALBUMIN in the last 168 hours. No results for input(s): LIPASE, AMYLASE in the last 168 hours. No results for input(s): AMMONIA in the last 168 hours. CBC: Recent Labs  Lab 12/30/17 0640  01/01/18 0352 01/02/18 0858 01/03/18 0502 01/04/18 0432  WBC 11.6* 15.3* 14.2* 14.0* 14.5*  NEUTROABS 8.1* 12.0* 11.1* 10.4*  --   HGB 11.2* 10.7* 10.7* 10.6* 10.4*  HCT 35.1* 33.9* 33.4* 33.1* 33.0*  MCV 86.9 88.5 87.2 86.9 88.2  PLT 387 432* 389 459* 510*   Cardiac Enzymes: No results for input(s): CKTOTAL, CKMB, CKMBINDEX, TROPONINI in the last 168 hours. BNP: Invalid input(s): POCBNP CBG: Recent Labs  Lab 01/04/18 0751 01/04/18 1132 01/04/18 1727 01/04/18 2305 01/05/18 0809  GLUCAP 152* 147* 88 103* 137*   D-Dimer No results for input(s): DDIMER in the last 72 hours. Hgb A1c No results for input(s): HGBA1C in the last 72 hours. Lipid Profile No results for input(s): CHOL, HDL, LDLCALC, TRIG, CHOLHDL, LDLDIRECT in the last 72 hours. Thyroid function studies No results for input(s): TSH, T4TOTAL, T3FREE, THYROIDAB in the last 72 hours.  Invalid input(s): FREET3 Anemia work up No results for input(s): VITAMINB12, FOLATE, FERRITIN, TIBC, IRON, RETICCTPCT in the last 72 hours. Urinalysis    Component Value Date/Time   COLORURINE YELLOW 12/25/2017 1534   APPEARANCEUR HAZY (A) 12/25/2017 1534   LABSPEC 1.028 12/25/2017 1534   PHURINE 5.0 12/25/2017 1534   GLUCOSEU 50 (A) 12/25/2017 1534   HGBUR SMALL (A) 12/25/2017 1534   BILIRUBINUR NEGATIVE 12/25/2017 1534   KETONESUR 5 (A) 12/25/2017 1534   PROTEINUR 30 (A) 12/25/2017 1534   UROBILINOGEN 1.0 02/23/2012 0047   NITRITE NEGATIVE 12/25/2017 1534   LEUKOCYTESUR NEGATIVE 12/25/2017 1534   Sepsis Labs Invalid input(s): PROCALCITONIN,  WBC,  LACTICIDVEN Microbiology Recent Results (from the past 240 hour(s))  Aerobic/Anaerobic Culture (surgical/deep wound)     Status: None   Collection Time: 12/26/17  4:46 PM  Result Value Ref Range Status   Specimen Description   Final    ABSCESS PERIHEPATIC Performed at [facility] [address] [place], [place] [address]    Special Requests NONE  SAMPLE B  Final   Gram Stain   Final    ABUNDANT WBC PRESENT, PREDOMINANTLY PMN RARE GRAM POSITIVE COCCI    Culture   Final    RARE ENTEROCOCCUS FAECALIS RARE ESCHERICHIA COLI MODERATE BACTEROIDES FRAGILIS BETA LACTAMASE POSITIVE Confirmed Extended Spectrum Beta-Lactamase Producer (ESBL).  In bloodstream infections from ESBL organisms, carbapenems are preferred over piperacillin/tazobactam. They are shown to have a lower risk of mortality. Performed at [facility], [place] [address], [place], [place] [address]    Report Status 01/03/2018 FINAL  Final   Organism ID, Bacteria ENTEROCOCCUS FAECALIS  Final   Organism ID, Bacteria ESCHERICHIA COLI  Final      Susceptibility   Escherichia coli -  MIC*    AMPICILLIN >=32 RESISTANT Resistant     CEFAZOLIN >=64 RESISTANT Resistant     CEFEPIME RESISTANT Resistant     CEFTAZIDIME RESISTANT Resistant     CEFTRIAXONE RESISTANT Resistant     CIPROFLOXACIN 1 SENSITIVE Sensitive     GENTAMICIN <=1 SENSITIVE Sensitive     IMIPENEM <=0.25 SENSITIVE Sensitive     TRIMETH/SULFA >=320 RESISTANT Resistant     AMPICILLIN/SULBACTAM 16 INTERMEDIATE Intermediate     PIP/TAZO <=4 SENSITIVE Sensitive     Extended ESBL POSITIVE Resistant     * RARE ESCHERICHIA COLI   Enterococcus faecalis - MIC*    AMPICILLIN <=2 SENSITIVE Sensitive     VANCOMYCIN 1 SENSITIVE Sensitive     GENTAMICIN SYNERGY SENSITIVE Sensitive     * RARE ENTEROCOCCUS FAECALIS  Aerobic/Anaerobic Culture (surgical/deep wound)     Status: None   Collection Time: 12/26/17  4:46 PM  Result Value Ref Range Status   Specimen Description GALL BLADDER  Final   Special Requests  SAMPLE A  Final   Gram Stain   Final    ABUNDANT WBC PRESENT, PREDOMINANTLY PMN FEW GRAM POSITIVE COCCI FEW GRAM NEGATIVE RODS    Culture   Final    ABUNDANT ENTEROCOCCUS FAECALIS ABUNDANT ESCHERICHIA COLI Confirmed Extended Spectrum Beta-Lactamase Producer (ESBL).  In bloodstream infections from ESBL  organisms, carbapenems are preferred over piperacillin/tazobactam. They are shown to have a lower risk of mortality. ABUNDANT BACTEROIDES FRAGILIS BETA LACTAMASE POSITIVE Performed at [facility], [place] [address], [place], [place] [address]    Report Status 12/31/2017 FINAL  Final   Organism ID, Bacteria ENTEROCOCCUS FAECALIS  Final   Organism ID, Bacteria ESCHERICHIA COLI  Final      Susceptibility   Escherichia coli - MIC*    AMPICILLIN >=32 RESISTANT Resistant     CEFAZOLIN >=64 RESISTANT Resistant     CEFEPIME RESISTANT Resistant     CEFTAZIDIME RESISTANT Resistant     CEFTRIAXONE RESISTANT Resistant     CIPROFLOXACIN 1 SENSITIVE Sensitive     GENTAMICIN <=1 SENSITIVE Sensitive     IMIPENEM <=0.25 SENSITIVE Sensitive     TRIMETH/SULFA >=320 RESISTANT Resistant     AMPICILLIN/SULBACTAM >=32 RESISTANT Resistant     PIP/TAZO <=4 SENSITIVE Sensitive     Extended ESBL POSITIVE Resistant     * ABUNDANT ESCHERICHIA COLI   Enterococcus faecalis - MIC*    AMPICILLIN <=2 SENSITIVE Sensitive     VANCOMYCIN 1 SENSITIVE Sensitive     GENTAMICIN SYNERGY SENSITIVE Sensitive     * ABUNDANT ENTEROCOCCUS FAECALIS  Culture, blood (routine x 2)     Status: None   Collection Time: 12/28/17  9:53 AM  Result Value Ref Range Status   Specimen Description   Final    BLOOD RIGHT ANTECUBITAL Performed at [facility] [address] [place], [place] [address]    Special Requests   Final    BOTTLES DRAWN AEROBIC ONLY Blood Culture adequate volume Performed at [facility] [address] [place], [place] [address]    Culture   Final    NO GROWTH 5 DAYS Performed at [facility], [place] [address], [place], [place] [address]    Report Status 01/02/2018 FINAL  Final  Culture, blood (routine x 2)     Status: None   Collection Time: 12/28/17 10:21 AM  Result Value Ref Range Status   Specimen Description   Final    BLOOD RIGHT  HAND Performed at  [facility], [place] [address], [place], [place] [address]    Special Requests   Final    BOTTLES DRAWN AEROBIC ONLY Blood Culture adequate volume Performed at [place] [address] [place], [place] [address]    Culture   Final    NO GROWTH 5 DAYS Performed at [facility], [place] [address], [place], [place] [address]    Report Status 01/02/2018 FINAL  Final    Time coordinating discharge: 35 minutes  SIGNED:  Chipper Oman, MD  [facility] 01/05/2018, 10:11 AM  Pager please text page via  [email or web address]  Note - This record has been created using [employer]. Chart creation errors have been sought, but may not always have been located. Such creation errors do not reflect on the standard of medical care.

## 2018-01-07 ENCOUNTER — Telehealth: Payer: Self-pay

## 2018-01-07 NOTE — Telephone Encounter (Signed)
Possible re-admission to facility. This is a patient you were seeing at [facility]. [facility] F/U is needed if patient was re-admitted to facility upon discharge. Hospital discharge from [facility] on 01/05/2018

## 2018-01-11 ENCOUNTER — Encounter: Payer: Self-pay | Admitting: Internal Medicine

## 2018-01-14 ENCOUNTER — Encounter: Payer: Self-pay | Admitting: Internal Medicine

## 2018-01-14 NOTE — Progress Notes (Signed)
Patient ID: Preston Moore, male   DOB: 1934-05-02, 84 y.o.   MRN: [id number]  Provider:  DR Arletha Grippe Location:  [place] [facility] [place] of Service:  SNF (31)  PCP: Gildardo Cranker, DO Patient Care Team: Gildardo Cranker, DO as PCP - General (Internal Medicine) Nyoka Cowden Phylis Bougie, NP as Nurse Practitioner ([facility] [place] ([place])  Extended Emergency Contact Information Primary Emergency Contact: [name],[name] Address: [address]          [place], [place] [address] [name] of [place] Phone: [phone] Relation: Daughter Secondary Emergency Contact: [name] Address: [address], [address] [place], [place] [address] [name] of [place] Phone: [phone] Work Phone: [phone] Mobile Phone: [phone] Relation: Daughter  Code Status:  Goals of Care: Advanced Directive information Advanced Directives 12/25/2017  Does Patient Have a Medical Advance Directive? Yes  Type of Advance Directive Out of facility DNR (pink MOST or yellow form)  Does patient want to make changes to medical advance directive? No - Patient declined  Copy of [place] in Chart? -  Would patient like information on creating a medical advance directive? -  Pre-existing out of facility DNR order (yellow form or pink MOST form) Yellow form placed in chart (order not valid for inpatient use)      Chief Complaint  Patient presents with  . Readmit To SNF    from hospital    HPI: Patient is a 84 y.o. male seen today for re-admission to SNF following hospital stay for acute metabolic encephalopathy, new dx sHF, HTN, hx atrial flutter, CKD stage 3, DM, chronic narcotic use, chronic constipation, chronic sacral and heel decubitus ulcers, obesity. 2D echo revealed EF 35%. CT head neg for acute process. Cardio consulted and tx medically. He presents to SNF to continue long term care.  Today he c/o  cough, weakness. No f/c. Appetite reduced. Sleeps ok. No nursing issues.  Appetite reduced. Sleeps ok. Albumin 2.4.  Essential hypertension - stable on toprol xl 25 mg daily   Ischemic cardiomyopathy  - EF reduced. He takes lasix 20 mg daily   GERD without esophagitis - stable on protonix 40 mg daily   Dyslipidemia - stable on lipitor 10 mg nightly. LDL 117   DM - uncontrolled. A1c 9.9%; takes lantus 45 units nightly and novlog  6 units after meals;  He has neuropathy and takes neurontin 300 mg three times daily; Tylenol 1 gm three times daily; ultram 25 mg twice daily; vicodin 5/325 mg every 6 hours as needed.  Chronic constipation - stable on miralax twice daily and colace twice daily   Iron deficiency anemia - stable on iron daily. Hgb 14.6  CKD - stage 3. Cr 1.15  Insomnia - stable on trazodone 100 mg nightly   Vit D deficiency - resolved. vit D25OH level: 47.12  Past Medical History:  Diagnosis Date  . Atrial flutter ([place])   . B12 deficiency 02/22/2012  . Closed right hip fracture, initial encounter ([place]) 04/06/2017  . Diabetes mellitus   . Hyperlipidemia   . Hypertension   . Ileus ([place]) 02/29/2012  . Malnutrition ([place]) 02/29/2012  . Neuropathy   . Non compliance with medical treatment 02/29/2012  . Pancreatitis 02/22/2012  . SBO (small bowel obstruction) ([place]) 02/23/2012  . Syncope and collapse    Past Surgical History:  Procedure Laterality Date  . BACK SURGERY    .  COMPRESSION HIP SCREW Right 04/07/2017   Procedure: RIGHT HIP INTRATROCHANTERIC NAILING;  Surgeon: [name], MD;  Location: [facility];  Service: Orthopedics;  Laterality: Right;  . IR IMAGE GUIDED DRAINAGE PERCUT CATH  PERITONEAL RETROPERIT  12/26/2017  . IR PERC CHOLECYSTOSTOMY  12/26/2017  . LAMINECTOMY  1965    reports that he has been smoking cigarettes.  He has a 5.00 pack-year smoking history. He has never used smokeless tobacco. He reports that he drinks about 21.6 oz of alcohol per week. He reports  that he does not use drugs. Social History   Socioeconomic History  . Marital status: Widowed    Spouse name: Not on file  . Number of children: Y  . Years of education: Not on file  . Highest education level: Not on file  Occupational History  . Occupation: retired Research scientist (medical): RETIRED  Social Needs  . Financial resource strain: Not on file  . Food insecurity:    Worry: Not on file    Inability: Not on file  . Transportation needs:    Medical: Not on file    Non-medical: Not on file  Tobacco Use  . Smoking status: Current Every Day Smoker    Packs/day: 0.50    Years: 10.00    Pack years: 5.00    Types: Cigarettes  . Smokeless tobacco: Never Used  Substance and Sexual Activity  . Alcohol use: Yes    Alcohol/week: 21.6 oz    Types: 36 Cans of beer per week  . Drug use: No  . Sexual activity: Never  Lifestyle  . Physical activity:    Days per week: Not on file    Minutes per session: Not on file  . Stress: Not on file  Relationships  . Social connections:    Talks on phone: Not on file    Gets together: Not on file    Attends religious service: Not on file    Active member of club or organization: Not on file    Attends meetings of clubs or organizations: Not on file    Relationship status: Not on file  . Intimate partner violence:    Fear of current or ex partner: Not on file    Emotionally abused: Not on file    Physically abused: Not on file    Forced sexual activity: Not on file  Other Topics Concern  . Not on file  Social History Narrative  . Not on file    Functional Status Survey:    Family History  Problem Relation Age of Onset  . Hypertension Neg Hx   . Coronary artery disease Neg Hx   . Diabetes Neg Hx     Health Maintenance  Topic Date Due  . FOOT EXAM  08/21/2018 (Originally 09/14/1944)  . INFLUENZA VACCINE  09/25/2018 (Originally 05/16/2018)  . OPHTHALMOLOGY EXAM  09/25/2018 (Originally 09/14/1944)  . TETANUS/TDAP   09/25/2018 (Originally 09/15/1955)  . PNA vac Low Risk Adult (1 of 2 - PCV13) 09/25/2018 (Originally 09/14/2001)  . HEMOGLOBIN A1C  03/28/2018  . URINE MICROALBUMIN  10/08/2018    No Known Allergies  Outpatient Encounter Medications as of 11/19/2017  Medication Sig  . [DISCONTINUED] acetaminophen (TYLENOL) 500 MG tablet Take 1,000 mg by mouth every 8 (eight) hours as needed for moderate pain.   . [DISCONTINUED] albuterol (ACCUNEB) 0.63 MG/3ML nebulizer solution Take 3 mLs (0.63 mg total) by nebulization every 2 (two) hours as needed for wheezing or shortness of breath.  . [  DISCONTINUED] albuterol (PROVENTIL) (2.5 MG/3ML) 0.083% nebulizer solution Take 3 mLs (2.5 mg total) by nebulization 3 (three) times daily.  . [DISCONTINUED] Amino Acids-Protein Hydrolys (FEEDING SUPPLEMENT, PRO-STAT SUGAR FREE 64,) LIQD Take 30 mLs 2 (two) times daily by mouth.  . [DISCONTINUED] amoxicillin-clavulanate (AUGMENTIN) 875-125 MG tablet Take 1 tablet by mouth 2 (two) times daily. For 6 more doses  . [DISCONTINUED] atorvastatin (LIPITOR) 10 MG tablet Take 10 mg by mouth at bedtime.  . [DISCONTINUED] benzonatate (TESSALON) 200 MG capsule Take 1 capsule (200 mg total) by mouth 3 (three) times daily as needed for cough.  . [DISCONTINUED] Cholecalciferol 50000 units TABS Give 1 tablet by mouth every Saturday  . [DISCONTINUED] docusate sodium (COLACE) 100 MG capsule Take 1 capsule (100 mg total) by mouth 2 (two) times daily.  . [DISCONTINUED] ferrous sulfate 325 (65 FE) MG tablet Take 325 mg by mouth daily with breakfast.   . [DISCONTINUED] furosemide (LASIX) 20 MG tablet Take 2 tablets (40 mg total) by mouth daily.  . [DISCONTINUED] gabapentin (NEURONTIN) 300 MG capsule Take 300 mg by mouth 3 (three) times daily.   . [DISCONTINUED] guaiFENesin (MUCINEX) 600 MG 12 hr tablet Take 2 tablets (1,200 mg total) by mouth 2 (two) times daily. (Patient not taking: Reported on 11/21/2017)  . [DISCONTINUED] Hydrocortisone  (GERHARDT'S BUTT CREAM) CREA Apply 1 application topically 3 (three) times daily. Cleanse skin on buttocks with tepid tap water and gently pat dry.  Apply in a 1/8 inch layer to buttocks, medial thighs, perineal area.  Turn side to side and minimize time spent in the supine position. Keep HOB at or below a 30 degree angle. (Patient not taking: Reported on 11/21/2017)  . [DISCONTINUED] insulin aspart (NOVOLOG) 100 UNIT/ML injection Inject 15 Units into the skin 3 (three) times daily with meals. Inject 11 units subcutaneously after meals  . [DISCONTINUED] insulin glargine (LANTUS) 100 UNIT/ML injection Inject 57 Units into the skin at bedtime.   . [DISCONTINUED] lisinopril (ZESTRIL) 2.5 MG tablet Take 2 tablets (5 mg total) by mouth daily. (Patient not taking: Reported on 12/25/2017)  . [DISCONTINUED] metoprolol succinate (TOPROL-XL) 25 MG 24 hr tablet Take 25 mg by mouth daily.  . [DISCONTINUED] Multiple Vitamins-Minerals (DECUBI-VITE) CAPS Take 1 capsule by mouth daily.  . [DISCONTINUED] Multiple Vitamins-Minerals (MULTIVITAMIN ADULT) TABS Take 1 tablet by mouth daily.  . [DISCONTINUED] oseltamivir (TAMIFLU) 30 MG capsule Take 1 capsule (30 mg total) by mouth 2 (two) times daily. For 5 more doses and then stop (Patient not taking: Reported on 11/21/2017)  . [DISCONTINUED] pantoprazole (PROTONIX) 40 MG tablet Take 40 mg by mouth daily.  . [DISCONTINUED] polyethylene glycol (MIRALAX / GLYCOLAX) packet Take 17 g by mouth 2 (two) times daily. (Patient taking differently: Take 17 g by mouth 2 (two) times daily as needed for moderate constipation. )  . [DISCONTINUED] potassium chloride SA (K-DUR,KLOR-CON) 20 MEQ tablet Take 1 tablet (20 mEq total) by mouth daily.  . [DISCONTINUED] protein supplement shake (PREMIER PROTEIN) LIQD Take 325 mLs (11 oz total) by mouth 2 (two) times daily between meals. (Patient not taking: Reported on 12/24/2017)  . [DISCONTINUED] Skin Protectants, Misc. (CALAZIME SKIN PROTECTANT EX)  Apply 1 application topically 2 (two) times daily. Apply to buttock every shift   . [DISCONTINUED] Skin Protectants, Misc. (EUCERIN) cream Apply to BLE topically two times daily for dry skin  . [DISCONTINUED] traMADol (ULTRAM) 50 MG tablet Give 1/2 tablet (25mg ) by mouth two times daily (Patient taking differently: Take 25 mg by mouth  every 12 (twelve) hours as needed. Give 1/2 tablet (25mg ) by mouth two times daily )  . [DISCONTINUED] traZODone (DESYREL) 100 MG tablet Take 1 tablet (100 mg total) by mouth at bedtime. (Patient taking differently: Take 100 mg by mouth at bedtime as needed. )  . [DISCONTINUED] Multiple Vitamins-Minerals (MULTIVITAMIN ADULTS PO) Take 1 capsule by mouth daily.   No facility-administered encounter medications on file as of 11/19/2017.     Review of Systems  Respiratory: Positive for cough.   Neurological: Positive for weakness.  All other systems reviewed and are negative.   Vitals:   11/19/17 1503  BP: (!) 138/92  Pulse: 76  Temp: (!) 97.2 F (36.2 C)  SpO2: 95%   There is no height or weight on file to calculate BMI. Physical Exam  Constitutional: He is oriented to person, place, and time. He appears well-developed and well-nourished.  Sitting in bed in NAD  HENT:  Mouth/Throat: Oropharynx is clear and moist.  MMM; no oral thrush  Eyes: Pupils are equal, round, and reactive to light. No scleral icterus.  Neck: Neck supple. Carotid bruit is not present.  Cardiovascular: Normal rate and intact distal pulses. An irregularly irregular rhythm present. Exam reveals no gallop and no friction rub.  Murmur heard.  Systolic murmur is present with a grade of 1/6. No distal LE edema. No calf TTP  Pulmonary/Chest: Effort normal. He has no wheezes. He has rhonchi (left base with prolonged expiratory phase). He has no rales. He exhibits no tenderness.  Abdominal: Soft. Normal appearance and bowel sounds are normal. He exhibits no distension, no abdominal bruit, no  pulsatile midline mass and no mass. There is no hepatomegaly. There is no tenderness. There is no rigidity, no rebound and no guarding. No hernia.  Musculoskeletal: He exhibits edema.  Lymphadenopathy:    He has no cervical adenopathy.  Neurological: He is alert and oriented to person, place, and time.  Skin: Skin is warm and dry. No rash noted.  Psychiatric: He has a normal mood and affect. His behavior is normal. Judgment and thought content normal.    Labs reviewed: Basic Metabolic Panel: Recent Labs    12/26/17 0554 12/27/17 1115 12/28/17 0339  01/02/18 0500 01/03/18 0502 01/04/18 0432  NA 141 138 136   < > 132* 134* 137  K 3.8 4.0 3.5   < > 5.1 4.1 3.7  CL 109 106 104   < > 98* 98* 100*  CO2 21* 23 23   < > 24 26 27   GLUCOSE 151* 274* 136*   < > 185* 217* 142*  BUN 34* 30* 23*   < > 36* 38* 35*  CREATININE 1.37* 1.28* 1.13   < > 1.12 1.18 1.23  CALCIUM 8.4* 8.1* 7.9*   < > 7.8* 8.3* 8.3*  MG 1.5* 2.0 2.0  --  1.8  --   --   PHOS 2.1* 2.2*  --   --   --   --   --    < > = values in this interval not displayed.   Liver Function Tests: Recent Labs    11/15/17 0414 11/16/17 0550 12/25/17 1026  AST 31 53* 113*  ALT 29 45 82*  ALKPHOS 75 71 108  BILITOT 0.8 0.5 0.8  PROT 6.6 6.7 7.2  ALBUMIN 2.4* 2.4* 2.1*   No results for input(s): LIPASE, AMYLASE in the last 8760 hours. No results for input(s): AMMONIA in the last 8760 hours. CBC: Recent Labs  01/01/18 0352 01/02/18 0858 01/03/18 0502 01/04/18 0432  WBC 15.3* 14.2* 14.0* 14.5*  NEUTROABS 12.0* 11.1* 10.4*  --   HGB 10.7* 10.7* 10.6* 10.4*  HCT 33.9* 33.4* 33.1* 33.0*  MCV 88.5 87.2 86.9 88.2  PLT 432* 389 459* 510*   Cardiac Enzymes: Recent Labs    11/15/17 0926 12/25/17 1122 12/25/17 1446  TROPONINI 0.03* 0.03* 0.03*   BNP: Invalid input(s): POCBNP Lab Results  Component Value Date   HGBA1C 9.9 09/27/2017   Lab Results  Component Value Date   TSH 2.236 02/19/2012   Lab Results    Component Value Date   VITAMINB12 110 (L) 02/19/2012   Lab Results  Component Value Date   FOLATE 5.6 02/19/2012   Lab Results  Component Value Date   IRON 10 (L) 02/20/2012   TIBC 146 (L) 02/20/2012   FERRITIN 359 (H) 02/20/2012    Imaging and Procedures obtained prior to SNF admission: Dg Chest 2 View  Result Date: 12/25/2017 CLINICAL DATA:  History pneumonia.  Cough and congestion. EXAM: CHEST - 2 VIEW COMPARISON:  11/14/2017. FINDINGS: Cardiomegaly with pulmonary venous congestion again noted. Bilateral interstitial prominence noted. Findings suggest CHF. Pneumonitis cannot be excluded. Small bilateral pleural effusions cannot be excluded. No pneumothorax. IMPRESSION: Cardiomegaly with pulmonary venous congestion bilateral interstitial prominence suggesting mild CHF. Small bilateral pleural effusions may be present. Electronically Signed   By: Marcello Moores  Register   On: 12/25/2017 10:38   Ct Head Wo Contrast  Result Date: 12/25/2017 CLINICAL DATA:  84 year old male with altered mental status. Recent pneumonia. EXAM: CT HEAD WITHOUT CONTRAST TECHNIQUE: Contiguous axial images were obtained from the base of the skull through the vertex without intravenous contrast. COMPARISON:  Head CT without contrast 11/15/2017 and earlier. FINDINGS: Brain: Stable cerebral volume. Stable ventricle size and configuration since 2013. Small chronic lacunar infarct in the inferior left cerebellum is stable. Patchy supratentorial cerebral white matter hypodensity is stable. No midline shift, mass effect, evidence of mass lesion, intracranial hemorrhage or evidence of cortically based acute infarction. Vascular: Calcified atherosclerosis at the skull base. No suspicious intracranial vascular hyperdensity. Skull: No acute osseous abnormality identified. Sinuses/Orbits: Improved sinus aeration since January. Residual sphenoid sinus mucosal thickening. Bilateral tympanic cavities and mastoids are clear. Other: No acute  orbit or scalp soft tissue findings. IMPRESSION: 1. No acute intracranial abnormality. Stable non contrast CT appearance of the brain since January. 2. Improved sinus aeration since January with residual sphenoid sinus disease. Electronically Signed   By: Genevie Ann M.D.   On: 12/25/2017 12:33   Nm Hepatobiliary Liver Func  Result Date: 12/26/2017 CLINICAL DATA:  Gallbladder wall thickening and sludge with right upper quadrant abdominal pain EXAM: NUCLEAR MEDICINE HEPATOBILIARY IMAGING TECHNIQUE: Sequential images of the abdomen were obtained out to 60 minutes following intravenous administration of radiopharmaceutical. RADIOPHARMACEUTICALS:  5.5 mCi Tc-60m  Choletec IV COMPARISON:  Abdominal ultrasound 12/25/2017 FINDINGS: Satisfactory uptake of radiopharmaceutical from the blood pool. Biliary and bowel activity by 5 minutes. Initial non filling of the gallbladder prompted IV administration of 4 mg of morphine. Subsequent imaging over the next 30 minutes demonstrates vague activity in the area of the gallbladder fossa but no definitive gallbladder filling. IMPRESSION: 1. Lack of definitive filling of the gallbladder on hepatobiliary scan despite morphine administration. Appearance is suspicious for lack of patency of the cystic duct and cholecystitis. Electronically Signed   By: Van Clines M.D.   On: 12/26/2017 12:44   Ct Abdomen Pelvis W Contrast  Result  Date: 12/25/2017 CLINICAL DATA:  Abdominal distention. EXAM: CT ABDOMEN AND PELVIS WITH CONTRAST TECHNIQUE: Multidetector CT imaging of the abdomen and pelvis was performed using the standard protocol following bolus administration of intravenous contrast. CONTRAST:  26mL ISOVUE-300 IOPAMIDOL (ISOVUE-300) INJECTION 61% COMPARISON:  02/23/2012 FINDINGS: Lower chest: Airspace opacities are identified within both lower lobes medially. Small right pleural effusion identified. Hepatobiliary: Low-attenuation fluid foci within the medial segment of left lobe  of liver appear contiguous with the gallbladder, image 14/2. These measure up to 1.7 cm and are suspicious for small liver abscesses. Moderate distension of the gallbladder. There may be a stone within the gallbladder measuring approximately 2 cm. No biliary dilatation. Pancreas: Unremarkable. No pancreatic ductal dilatation or surrounding inflammatory changes. Spleen: Normal in size without focal abnormality. Adrenals/Urinary Tract: The adrenal glands appear normal. Unremarkable appearance of the kidneys. The urinary bladder is negative. Stomach/Bowel: The stomach is normal. No abnormal dilatation of the small bowel loops. The appendix is visualized and appears normal. No pathologic dilatation of the colon. Vascular/Lymphatic: Aortic atherosclerosis noted. No aneurysm. No upper abdominal adenopathy identified. No pelvic or inguinal adenopathy. Reproductive: Prostate is unremarkable. Other: Peripherally enhancing fluid collection along the undersurface of the right lobe of liver measures 5.2 by 2.6 by 4.7 cm. Suspicious for subdiaphragmatic abscess. A small volume of perisplenic fluid is noted within the left upper quadrant of the abdomen between the spleen and left hemidiaphragm. Musculoskeletal: The bones appear diffusely osteopenic. There is degenerative disc disease identified within the lumbar spine. There is a compression fracture involving the L1 vertebra which is new from CT dated 02/23/2012. IMPRESSION: 1. Several low-attenuation foci within the medial segment of left lobe of liver appear contiguous with the gallbladder and are suspicious for small liver abscesses. 2. Given the suspicious changes within the medial segment of left lobe of liver cannot rule out gallbladder inflammation/infection. Less likely would be a gallbladder neoplasm which is invading the adjacent liver. As a next step, further evaluation with right upper quadrant gallbladder ultrasound is advised to further evaluate acute cholecystitis  versus mass. 3. Sub diaphragmatic fluid collection is identified and is suspicious for abscess. 4. Small perisplenic fluid collection. Indeterminate. Cannot rule out abscess. 5. Bilateral lower lobe airspace densities compatible with pneumonia. 6.  Aortic Atherosclerosis (ICD10-I70.0). 7. These results were called by telephone at the time of interpretation on 12/25/2017 at 12:51 pm to Dr. Vanita Panda, who verbally acknowledged these results. Electronically Signed   By: Kerby Moors M.D.   On: 12/25/2017 12:52   Ir Perc Cholecystostomy  Result Date: 12/26/2017 INDICATION: Sepsis and imaging findings consistent with acute cholecystitis. The patient is not a candidate for cholecystectomy currently and request has been made to place a percutaneous cholecystostomy tube. There is an additional fluid collection anterior to the liver suspicious by CT for additional perihepatic abscess. EXAM: 1. PERCUTANEOUS CHOLECYSTOSTOMY TUBE PLACEMENT 2. PERCUTANEOUS CATHETER DRAINAGE OF PERIHEPATIC PERITONEAL ABSCESS ANESTHESIA/SEDATION: Moderate (conscious) sedation was employed during this procedure. A total of Versed 2.0 mg and Fentanyl 100 mcg was administered intravenously. Moderate Sedation Time: 25 minutes. The patient's level of consciousness and vital signs were monitored continuously by radiology nursing throughout the procedure under my direct supervision. MEDICATIONS: No additional medications. FLUOROSCOPY TIME:  Fluoroscopy Time: 54 seconds.  29.3 mGy. COMPLICATIONS: None immediate. PROCEDURE: Informed written consent was obtained from the patient after a thorough discussion of the procedural risks, benefits and alternatives. All questions were addressed. Maximal Sterile Barrier Technique was utilized including caps, mask, sterile gowns,  sterile gloves, sterile drape, hand hygiene and skin antiseptic. A timeout was performed prior to the initiation of the procedure. The gallbladder was localized by ultrasound. Under  ultrasound guidance, a 21 gauge needle was advanced into the gallbladder lumen. Bile was aspirated from the gallbladder lumen. A small amount of contrast material was injected. A guidewire was advanced. A transitional dilator was placed. The tract was dilated and a 10 French percutaneous drainage catheter advanced. A bile sample was sent for culture analysis. The catheter was connected to gravity bag drainage. It was secured at the skin with a Prolene retention suture and StatLock device. The anterior perihepatic abscess was localized by ultrasound. Under ultrasound guidance, a 5 [place] Yueh centesis catheter was advanced into the collection. Aspiration was performed and a fluid sample sent for culture analysis. The catheter was removed over a guidewire. The tract was dilated and a 10 French percutaneous drainage catheter placed. The catheter was connected to suction bulb drainage. It was secured at the skin with a Prolene retention suture and StatLock device. FINDINGS: By ultrasound the gallbladder is distended, contains multiple calculi and demonstrates wall thickening and inflammation. Aspiration yielded grossly purulent fluid from the gallbladder lumen. A 10 French cholecystostomy tube was placed and is draining well after placement. Aspiration at the level of the anterior perihepatic abscess yielded cloudy, thin yellow fluid. Given nature of fluid, an additional 10 [place] drain was placed in this collection. IMPRESSION: 1. Grossly purulent bile in the gallbladder lumen consistent with cholecystitis. A 10 French cholecystostomy tube was placed and attached to gravity bag drainage. 2. Anterior perihepatic fluid collection yielded cloudy yellow fluid. A 10 French percutaneous drainage catheter was placed in this collection and attached to suction bulb drainage. Electronically Signed   By: Aletta Edouard M.D.   On: 12/26/2017 17:32   Dg Chest Port 1 View  Result Date: 12/26/2017 CLINICAL DATA:  Cough and  congestion EXAM: PORTABLE CHEST 1 VIEW COMPARISON:  December 25, 2016 FINDINGS: There is airspace consolidation in the left base and right mid lung regions. These changes are similar to 1 day prior. There appears to be a degree of underlying interstitial edema. There is cardiomegaly. The pulmonary vascularity is within normal limits. There is aortic atherosclerosis. No adenopathy. There is degenerative change in the thoracic spine. There is calcification in the right carotid artery. IMPRESSION: Cardiomegaly with underlying interstitial edema. Areas of airspace consolidation in the left base and right mid lung may represent alveolar edema or pneumonia. Both entities may be present concurrently. Appearance similar 1 day prior. There is aortic atherosclerosis as well as calcification in right carotid artery. Aortic Atherosclerosis (ICD10-I70.0). Electronically Signed   By: Lowella Grip III M.D.   On: 12/26/2017 07:15   Ir Image Guided Drainage Percut Cath  Peritoneal Retroperit  Result Date: 12/26/2017 INDICATION: Sepsis and imaging findings consistent with acute cholecystitis. The patient is not a candidate for cholecystectomy currently and request has been made to place a percutaneous cholecystostomy tube. There is an additional fluid collection anterior to the liver suspicious by CT for additional perihepatic abscess. EXAM: 1. PERCUTANEOUS CHOLECYSTOSTOMY TUBE PLACEMENT 2. PERCUTANEOUS CATHETER DRAINAGE OF PERIHEPATIC PERITONEAL ABSCESS ANESTHESIA/SEDATION: Moderate (conscious) sedation was employed during this procedure. A total of Versed 2.0 mg and Fentanyl 100 mcg was administered intravenously. Moderate Sedation Time: 25 minutes. The patient's level of consciousness and vital signs were monitored continuously by radiology nursing throughout the procedure under my direct supervision. MEDICATIONS: No additional medications. FLUOROSCOPY TIME:  Fluoroscopy Time:  54 seconds.  29.3 mGy. COMPLICATIONS: None  immediate. PROCEDURE: Informed written consent was obtained from the patient after a thorough discussion of the procedural risks, benefits and alternatives. All questions were addressed. Maximal Sterile Barrier Technique was utilized including caps, mask, sterile gowns, sterile gloves, sterile drape, hand hygiene and skin antiseptic. A timeout was performed prior to the initiation of the procedure. The gallbladder was localized by ultrasound. Under ultrasound guidance, a 21 gauge needle was advanced into the gallbladder lumen. Bile was aspirated from the gallbladder lumen. A small amount of contrast material was injected. A guidewire was advanced. A transitional dilator was placed. The tract was dilated and a 10 French percutaneous drainage catheter advanced. A bile sample was sent for culture analysis. The catheter was connected to gravity bag drainage. It was secured at the skin with a Prolene retention suture and StatLock device. The anterior perihepatic abscess was localized by ultrasound. Under ultrasound guidance, a 5 [place] Yueh centesis catheter was advanced into the collection. Aspiration was performed and a fluid sample sent for culture analysis. The catheter was removed over a guidewire. The tract was dilated and a 10 French percutaneous drainage catheter placed. The catheter was connected to suction bulb drainage. It was secured at the skin with a Prolene retention suture and StatLock device. FINDINGS: By ultrasound the gallbladder is distended, contains multiple calculi and demonstrates wall thickening and inflammation. Aspiration yielded grossly purulent fluid from the gallbladder lumen. A 10 French cholecystostomy tube was placed and is draining well after placement. Aspiration at the level of the anterior perihepatic abscess yielded cloudy, thin yellow fluid. Given nature of fluid, an additional 10 [place] drain was placed in this collection. IMPRESSION: 1. Grossly purulent bile in the gallbladder  lumen consistent with cholecystitis. A 10 French cholecystostomy tube was placed and attached to gravity bag drainage. 2. Anterior perihepatic fluid collection yielded cloudy yellow fluid. A 10 French percutaneous drainage catheter was placed in this collection and attached to suction bulb drainage. Electronically Signed   By: Aletta Edouard M.D.   On: 12/26/2017 17:32   US Abdomen Limited Ruq  Result Date: 12/25/2017 CLINICAL DATA:  Abnormal appearance of gallbladder on CT EXAM: ULTRASOUND ABDOMEN LIMITED RIGHT UPPER QUADRANT COMPARISON:  CT abdomen and pelvis December 25, 2017 FINDINGS: Gallbladder: Sludge is seen throughout the gallbladder. No well-defined gallstones are delineated by ultrasound. Gallbladder wall appears thickened. No pericholecystic fluid evident. No sonographic Murphy sign noted by sonographer. Common bile duct: Diameter: 3 mm. No intrahepatic or extrahepatic biliary duct dilatation. Liver: Liver not completely visualized due to shadowing from apparent gas. Liver echogenicity appears overall increased. No focal liver lesions are evident on this study. Portal vein is patent on color Doppler imaging with normal direction of blood flow towards the liver. IMPRESSION: 1. Gallbladder wall appears mildly thickened with sludge in the gallbladder. No gallstones evident. A degree of acalculus cholecystitis is questioned. In this regard, it may be prudent to consider nuclear medicine hepatobiliary imaging study to assess for cystic duct patency. 2. Portions of liver not well visualized due to apparent gas. Findings on CT in the periphery of the liver are not appreciable on this sonographic evaluation. The overall echogenicity of the visualized portions of liver appear increased which may indicate a degree of underlying hepatic steatosis. The sensitivity of ultrasound for detection of focal liver lesions is diminished significantly in this circumstance. Electronically Signed   By: Lowella Grip III  M.D.   On: 12/25/2017 13:33    Assessment/Plan  ICD-10-CM   1. Cough due to bronchospasm J98.01   2. Type 2 diabetes mellitus with diabetic neuropathy, with long-term current use of insulin (HCC) E11.40    Z79.4   3. Dyslipidemia associated with type 2 diabetes mellitus (HCC) E11.69    E78.5   4. Essential hypertension I10   5. Chronic systolic HF (heart failure) (HCC) I50.22    Cont current  meds as ordered  PT/OT/ST as ordered  F/u with specialists as scheduled  GOAL: short term rehab then continue long term care. Communicated with pt and nursing.  Will follow  Labs/tests ordered: none    Angles Trevizo S. Perlie Gold  [facility] [address] [place], [place] [address] [phone] Cell (Monday-Friday 8 AM - 5 PM) [phone] After 5 PM and follow prompts

## 2018-01-15 DIAGNOSIS — I5022 Chronic systolic (congestive) heart failure: Secondary | ICD-10-CM | POA: Insufficient documentation

## 2018-02-13 DEATH — deceased

## 2018-06-05 ENCOUNTER — Encounter: Payer: Self-pay | Admitting: Internal Medicine

## 2018-06-22 IMAGING — US US ABDOMEN LIMITED
1 series · 13 of 25 positions shown · non-contrast
Comparison: CT abdomen and pelvis December 25, 2017

CLINICAL DATA: Abnormal appearance of gallbladder on CT

EXAM:
ULTRASOUND ABDOMEN LIMITED RIGHT UPPER QUADRANT

[Series 1: us abdomen limited · 0.28mm/px · 13 of 35 slices shown]
[im 1/35]
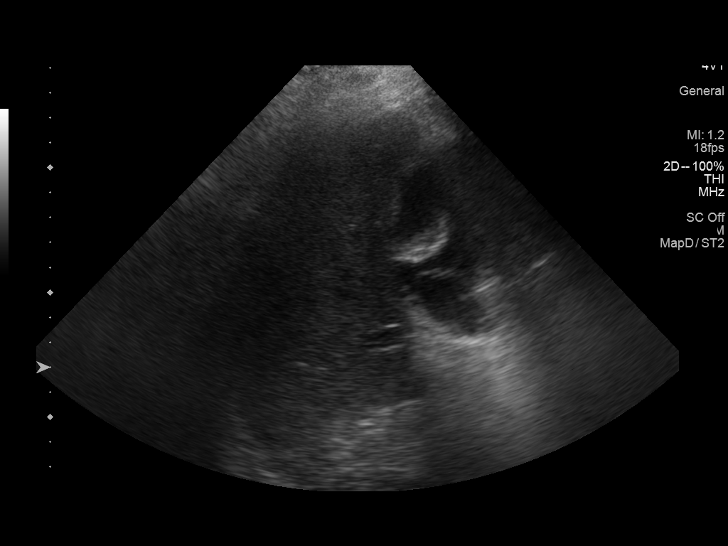
[im 3/35]
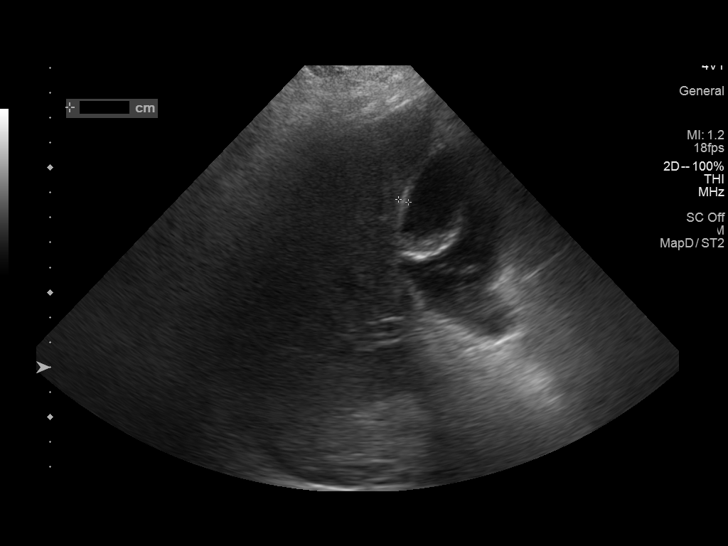
[im 6/35]
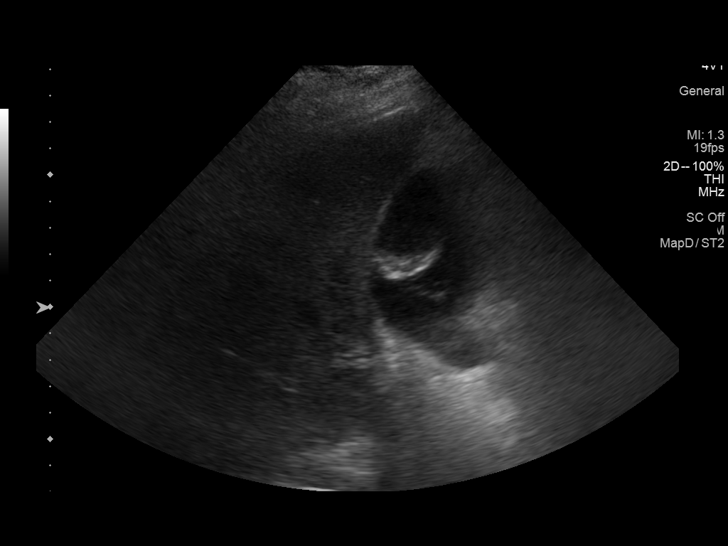
[im 9/35]
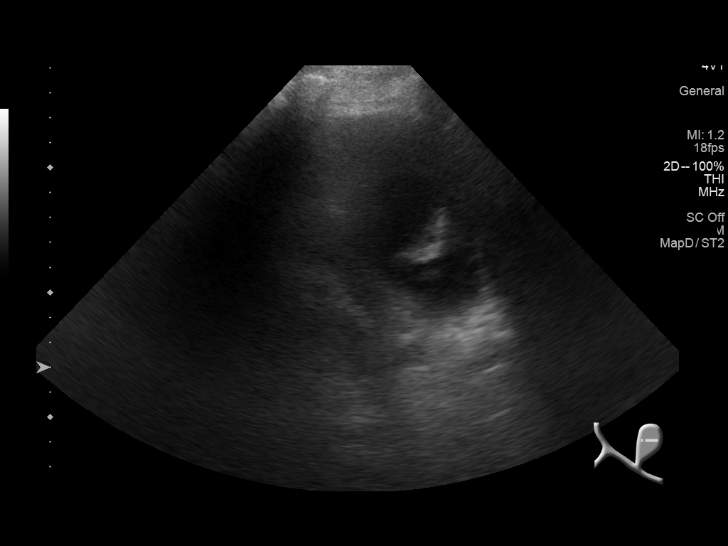
[im 12/35]
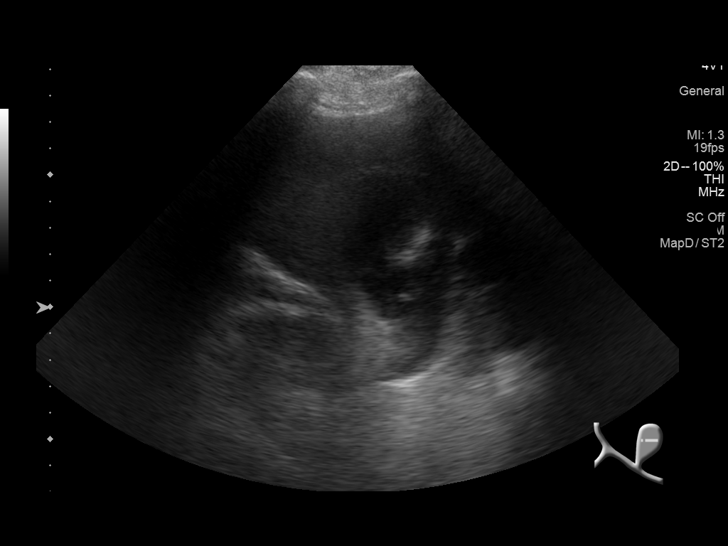
[im 15/35]
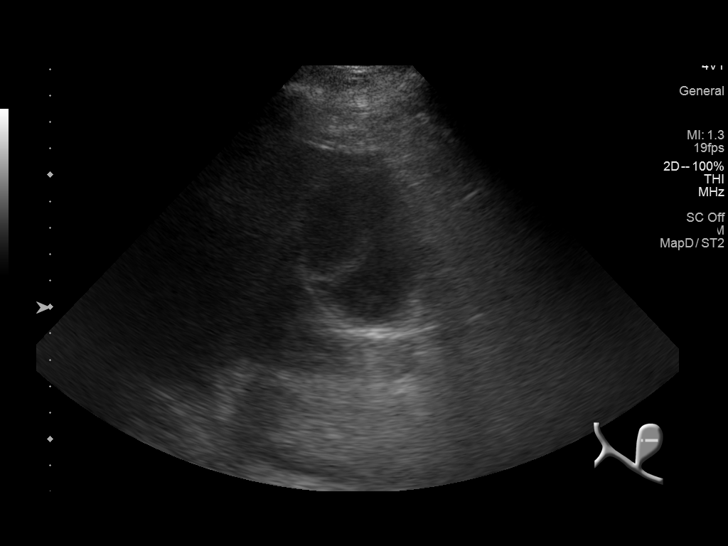
[im 18/35]
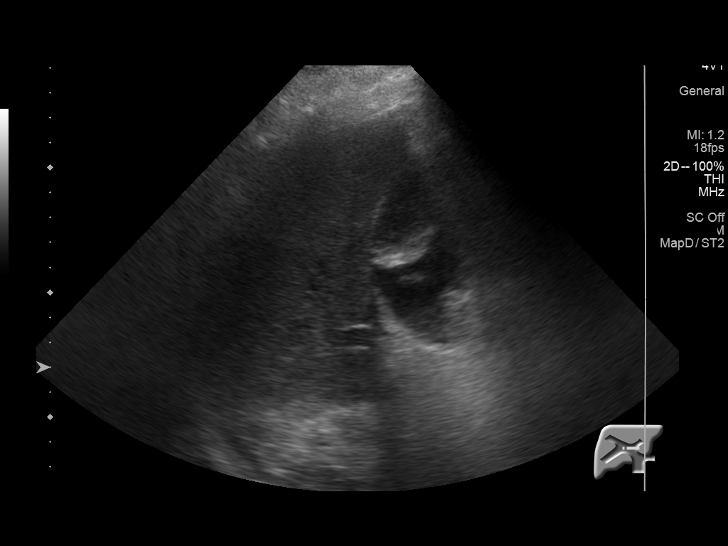
[im 20/35]
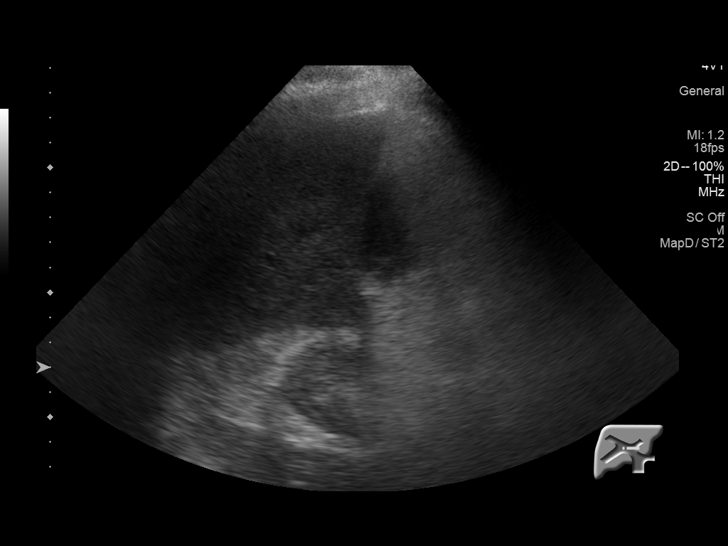
[im 23/35]
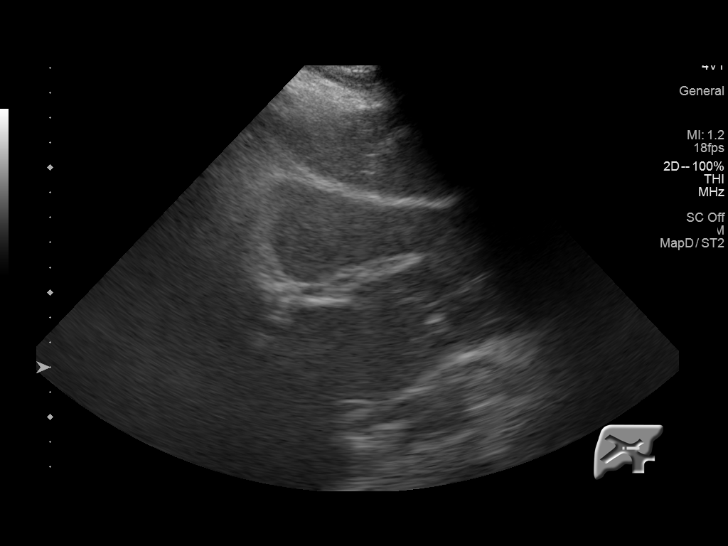
[im 26/35]
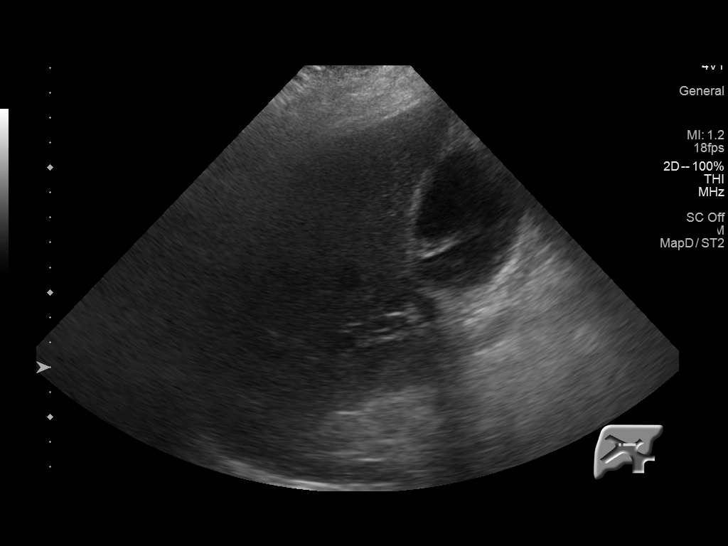
[im 29/35]
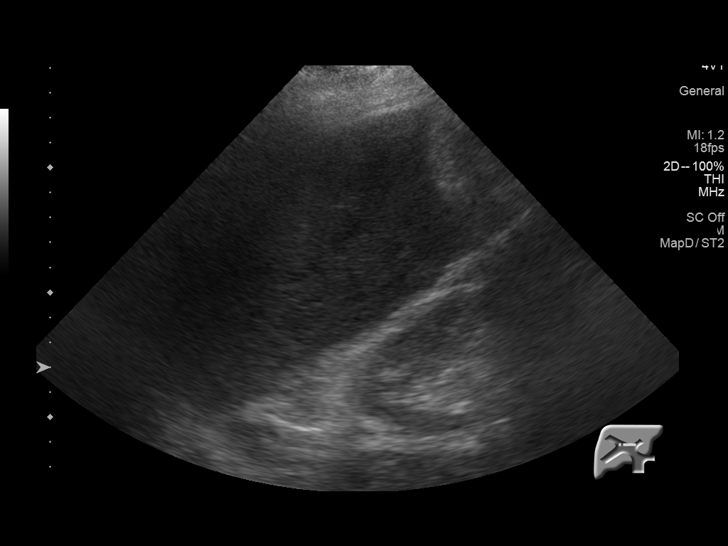
[im 32/35]
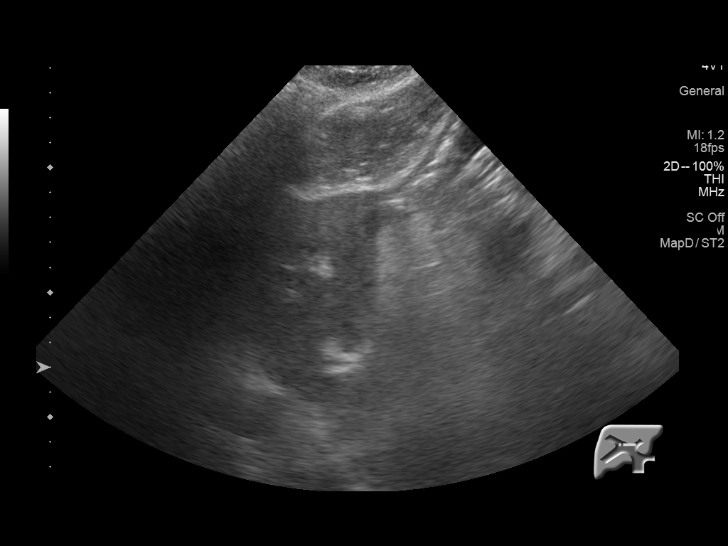
[im 35/35]
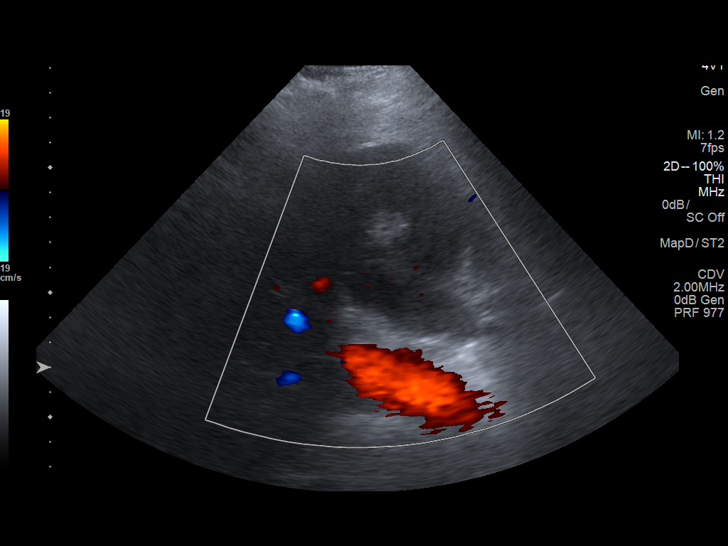

[13 of 25 positions shown; findings below may reference images not displayed]

FINDINGS: Gallbladder:

Sludge is seen throughout the gallbladder. No well-defined
gallstones are delineated by ultrasound. Gallbladder wall appears
thickened. No pericholecystic fluid evident. No sonographic Murphy
sign noted by sonographer.

Common bile duct:

Diameter: 3 mm. No intrahepatic or extrahepatic biliary duct
dilatation.

Liver:

Liver not completely visualized due to shadowing from apparent gas.
Liver echogenicity appears overall increased. No focal liver lesions
are evident on this study. Portal vein is patent on color Doppler
imaging with normal direction of blood flow towards the liver.
IMPRESSION: 1. Gallbladder wall appears mildly thickened with sludge in the
gallbladder. No gallstones evident. A degree of acalculus
cholecystitis is questioned. In this regard, it may be prudent to
consider nuclear medicine hepatobiliary imaging study to assess for
cystic duct patency.

2. Portions of liver not well visualized due to apparent gas.
Findings on CT in the periphery of the liver are not appreciable on
this sonographic evaluation. The overall echogenicity of the
visualized portions of liver appear increased which may indicate a
degree of underlying hepatic steatosis. The sensitivity of
ultrasound for detection of focal liver lesions is diminished
significantly in this circumstance.

## 2019-04-02 IMAGING — CT CT ABD-PELV W/ CM
2 of 8 series · 14 of 46 positions shown, 18 images · IV contrast (ISOVUE)
Comparison: 02/23/2012

CLINICAL DATA: Abdominal distention.

EXAM:
CT ABDOMEN AND PELVIS WITH CONTRAST
TECHNIQUE: Multidetector CT imaging of the abdomen and pelvis was performed
using the standard protocol following bolus administration of
intravenous contrast.
CONTRAST:  75mL 4NHYB6-F00 IOPAMIDOL (4NHYB6-F00) INJECTION 61%

[Series 2: axial st · axial · 0.98mm/px · z∈[-644,-229]mm · 11 of 95 slices shown, 15 images]
[im 6/95  soft-tissue]
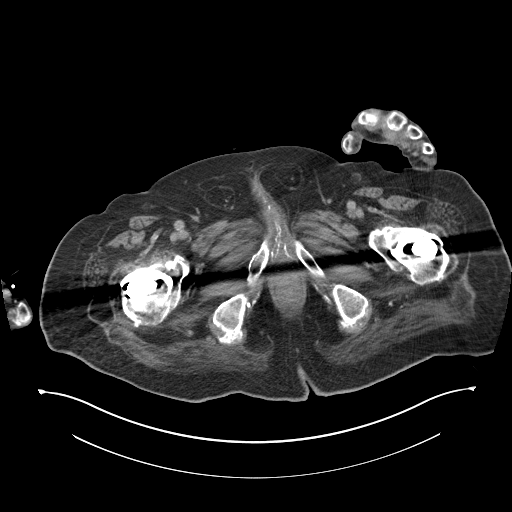
[im 6/95  bone]
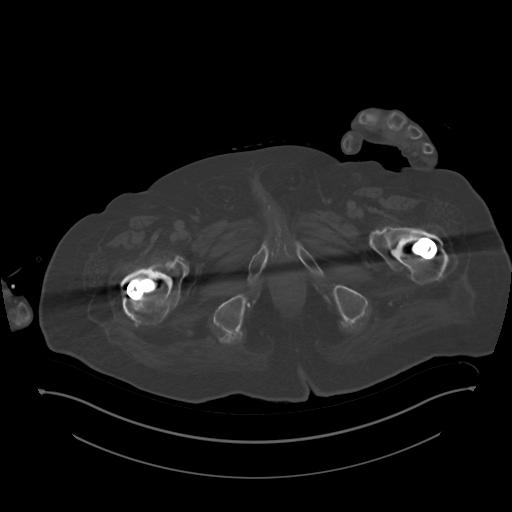
[im 18/95  soft-tissue]
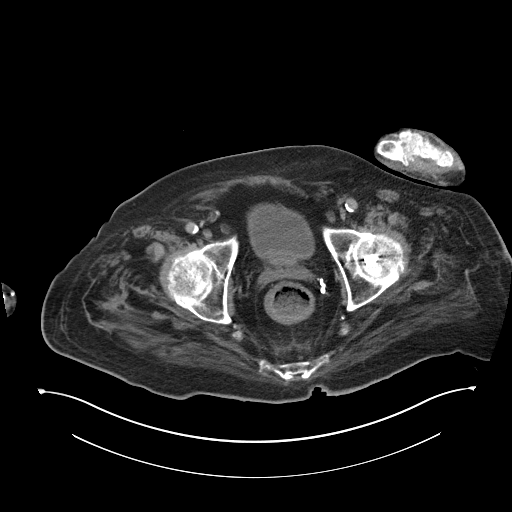
[im 30/95  soft-tissue]
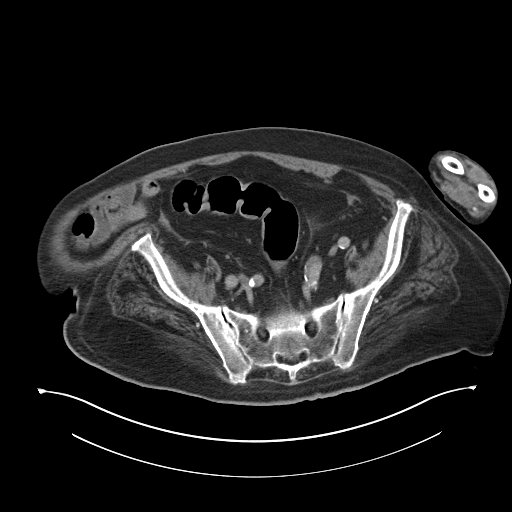
[im 36/95  soft-tissue]
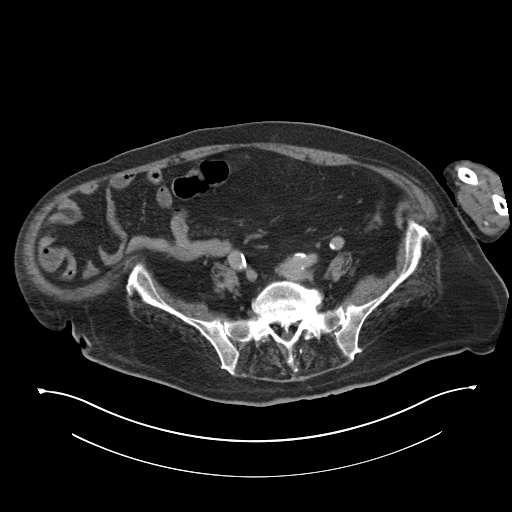
[im 48/95  soft-tissue]
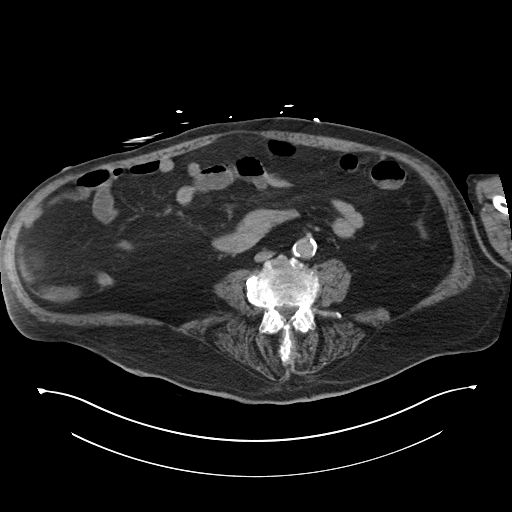
[im 59/95  soft-tissue]
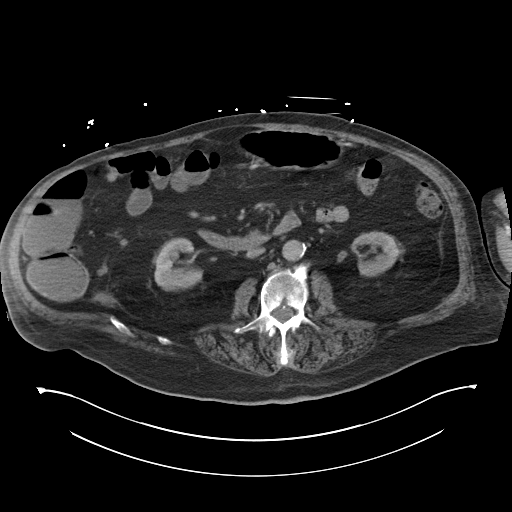
[im 65/95  soft-tissue]
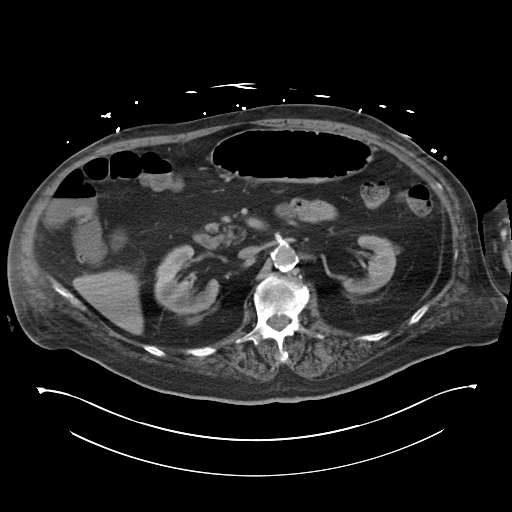
[im 71/95  lung]
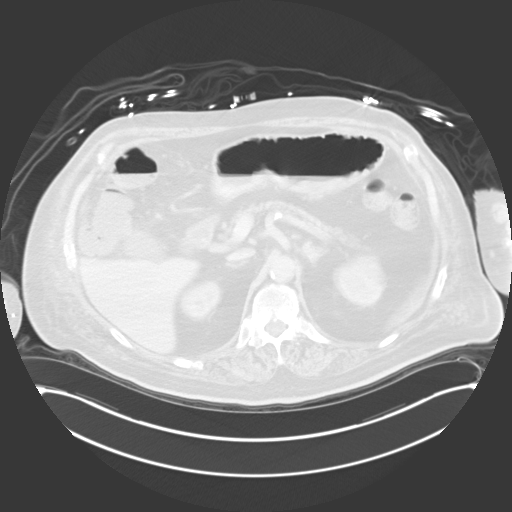
[im 77/95  soft-tissue]
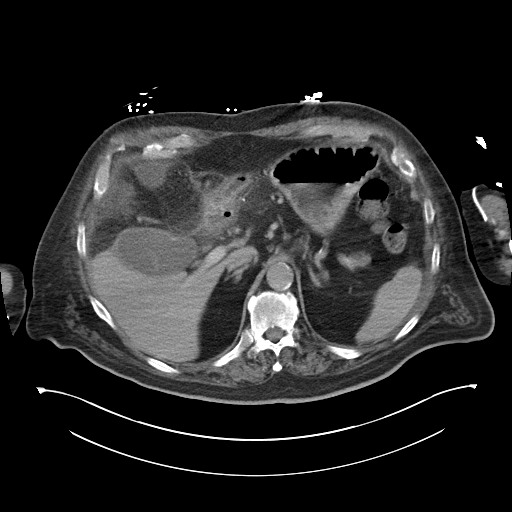
[im 77/95  lung]
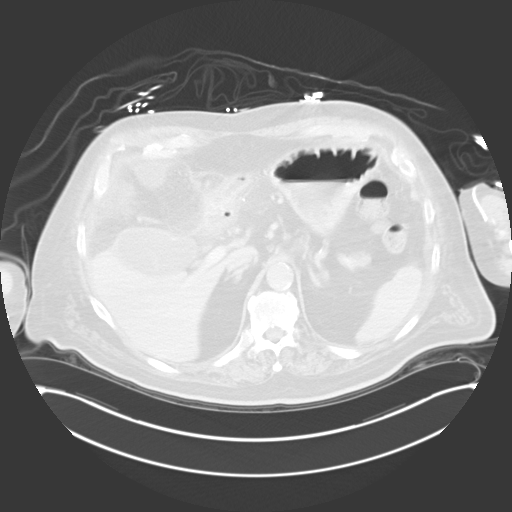
[im 83/95  lung]
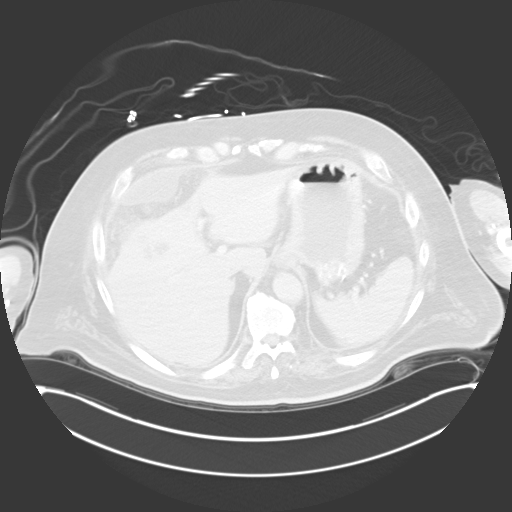
[im 89/95  soft-tissue]
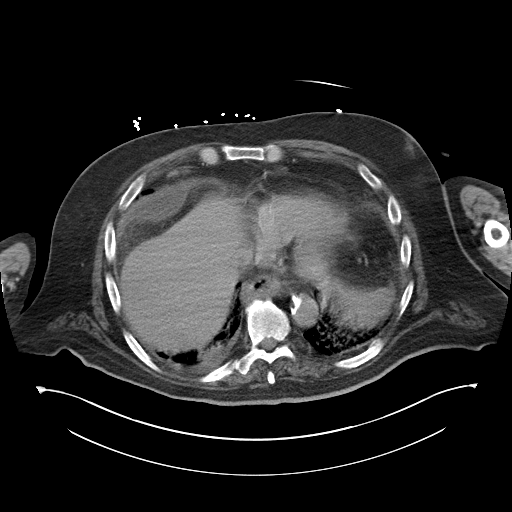
[im 89/95  lung]
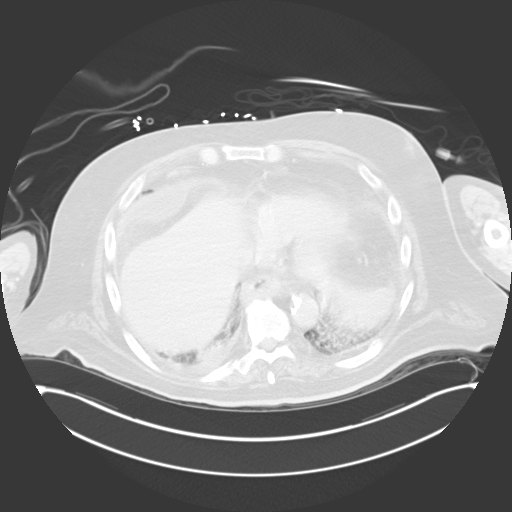
[im 89/95  bone]
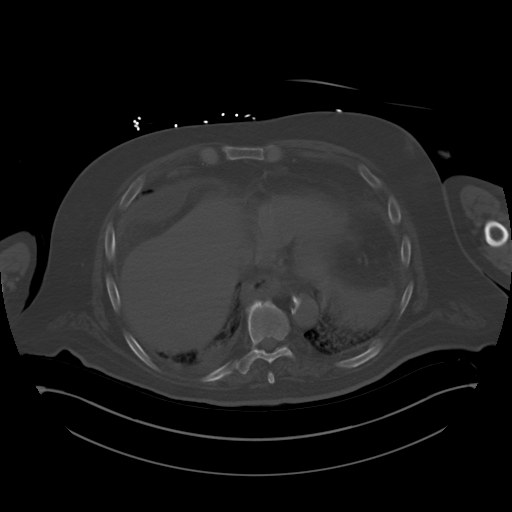

[Series 8: coronal st · coronal · 0.96mm/px · 3 of 101 slices shown]
[im 26/101  soft-tissue]
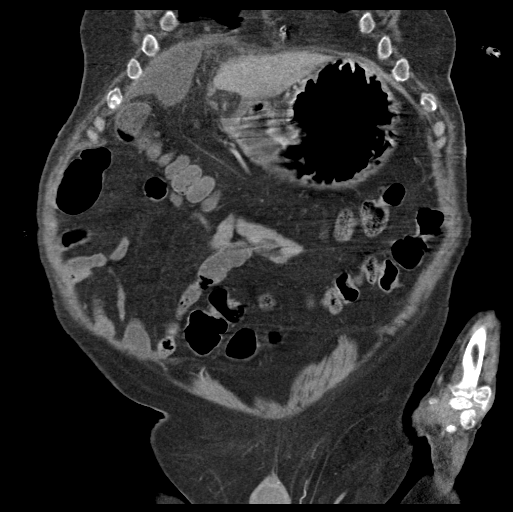
[im 51/101  soft-tissue]
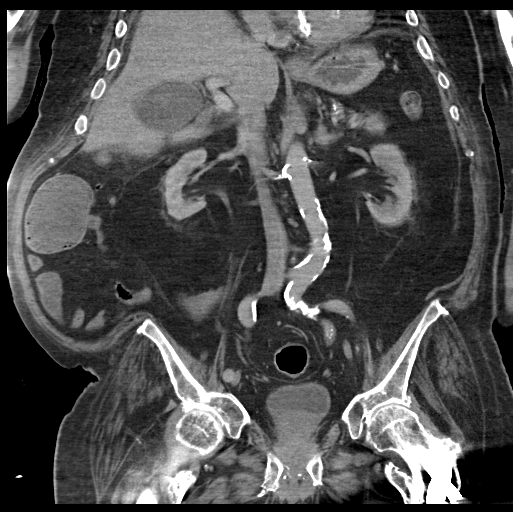
[im 76/101  soft-tissue]
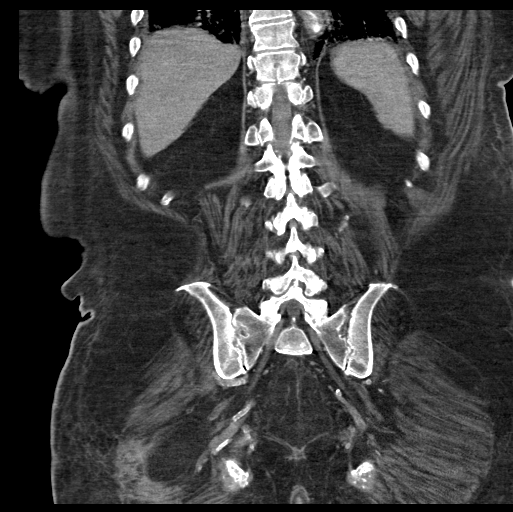

[14 of 46 positions shown; findings below may reference images not displayed]

FINDINGS: Lower chest: Airspace opacities are identified within both lower
lobes medially. Small right pleural effusion identified.

Hepatobiliary: Low-attenuation fluid foci within the medial segment
of left lobe of liver appear contiguous with the gallbladder, image
[DATE]. These measure up to 1.7 cm and are suspicious for small liver
abscesses. Moderate distension of the gallbladder. There may be a
stone within the gallbladder measuring approximately 2 cm. No
biliary dilatation.

Pancreas: Unremarkable. No pancreatic ductal dilatation or
surrounding inflammatory changes.

Spleen: Normal in size without focal abnormality.

Adrenals/Urinary Tract: The adrenal glands appear normal.
Unremarkable appearance of the kidneys. The urinary bladder is
negative.

Stomach/Bowel: The stomach is normal. No abnormal dilatation of the
small bowel loops. The appendix is visualized and appears normal. No
pathologic dilatation of the colon.

Vascular/Lymphatic: Aortic atherosclerosis noted. No aneurysm. No
upper abdominal adenopathy identified. No pelvic or inguinal
adenopathy.

Reproductive: Prostate is unremarkable.

Other: Peripherally enhancing fluid collection along the
undersurface of the right lobe of liver measures 5.2 by 2.6 by
cm. Suspicious for subdiaphragmatic abscess. A small volume of
perisplenic fluid is noted within the left upper quadrant of the
abdomen between the spleen and left hemidiaphragm.

Musculoskeletal: The bones appear diffusely osteopenic. There is
degenerative disc disease identified within the lumbar spine. There
is a compression fracture involving the L1 vertebra which is new
from CT dated 02/23/2012.
IMPRESSION: 1. Several low-attenuation foci within the medial segment of left
lobe of liver appear contiguous with the gallbladder and are
suspicious for small liver abscesses.
2. Given the suspicious changes within the medial segment of left
lobe of liver cannot rule out gallbladder inflammation/infection.
Less likely would be a gallbladder neoplasm which is invading the
adjacent liver. As a next step, further evaluation with right upper
quadrant gallbladder ultrasound is advised to further evaluate acute
cholecystitis versus mass.
3. Sub diaphragmatic fluid collection is identified and is
suspicious for abscess.
4. Small perisplenic fluid collection. Indeterminate. Cannot rule
[DATE]. Bilateral lower lobe airspace densities compatible with
pneumonia.
6.  Aortic Atherosclerosis ([id number]).
7. These results were called by telephone at the time of
interpretation on 12/25/2017 at [DATE] to Dr. Shekili, who
verbally acknowledged these results.

## 2019-04-03 IMAGING — NM NM HEPATOBILIARY IMAGE, INC GB
1 series · 12 of 12 positions shown · non-contrast
Comparison: Abdominal ultrasound 12/25/2017

CLINICAL DATA: Gallbladder wall thickening and sludge with right
upper quadrant abdominal pain

EXAM:
NUCLEAR MEDICINE HEPATOBILIARY IMAGING
TECHNIQUE: Sequential images of the abdomen were obtained [DATE] minutes
following intravenous administration of radiopharmaceutical.
RADIOPHARMACEUTICALS:  5.5 mCi Qc-YYm  Choletec IV

[Series 1: hepato · 4.46mm/px · 2 acquisitions, 12 frames shown]
[im 1/2]
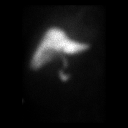
[im 1/2]
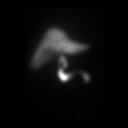
[im 1/2]
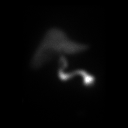
[im 1/2]
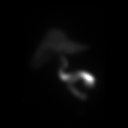
[im 1/2]
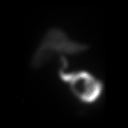
[im 1/2]
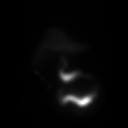
[im 2/2]
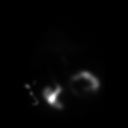
[im 2/2]
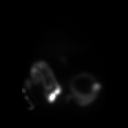
[im 2/2]
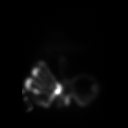
[im 2/2]
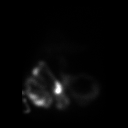
[im 2/2]
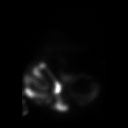
[im 2/2]
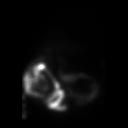

[12 of 12 positions shown; findings below may reference images not displayed]

FINDINGS: Satisfactory uptake of radiopharmaceutical from the blood pool.
Biliary and bowel activity by 5 minutes.

Initial non filling of the gallbladder prompted IV administration of
4 mg of morphine. Subsequent imaging over the next 30 minutes
demonstrates vague activity in the area of the gallbladder fossa but
no definitive gallbladder filling.
IMPRESSION: 1. Lack of definitive filling of the gallbladder on hepatobiliary
scan despite morphine administration. Appearance is suspicious for
lack of patency of the cystic duct and cholecystitis.

## 2019-04-03 IMAGING — DX DG CHEST 1V PORT
1 series · 1 of 1 positions shown · non-contrast
Comparison: December 25, 2016

CLINICAL DATA: Cough and congestion

EXAM:
PORTABLE CHEST 1 VIEW

[chest ap]
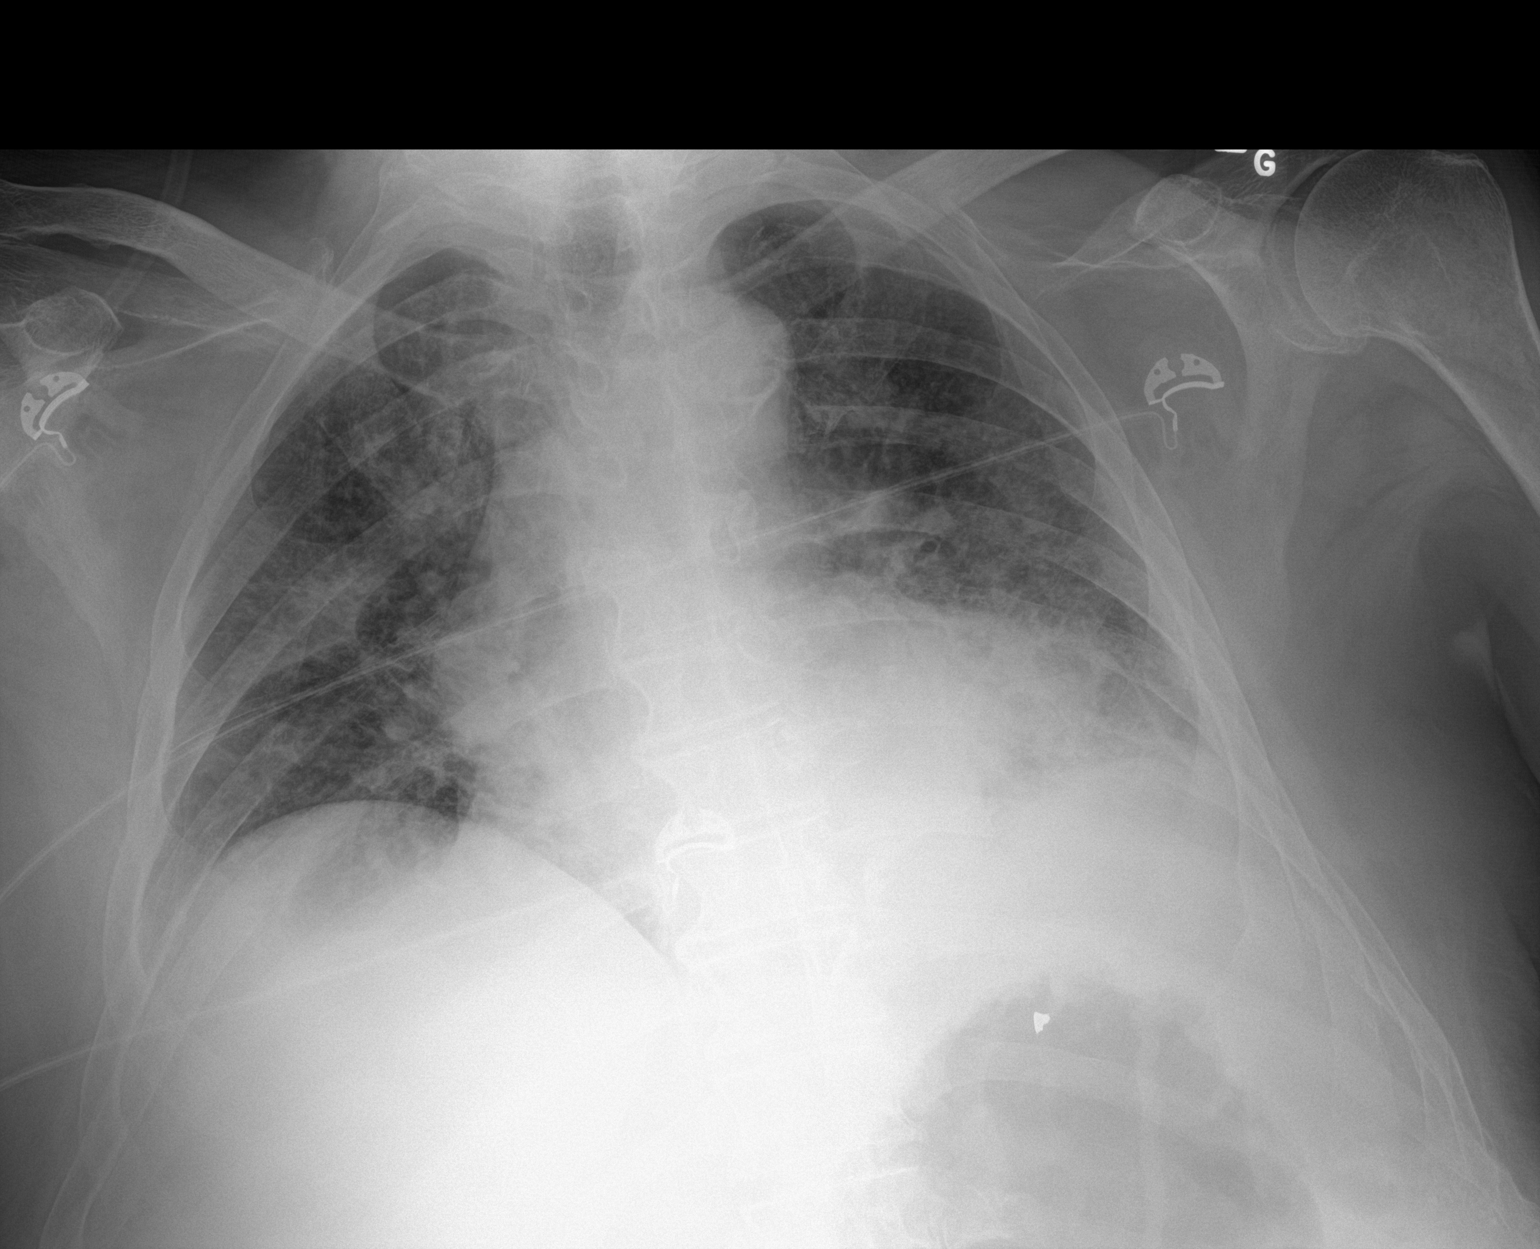

[1 of 1 positions shown; findings below may reference images not displayed]

FINDINGS: There is airspace consolidation in the left base and right mid lung
regions. These changes are similar to 1 day prior. There appears to
be a degree of underlying interstitial edema. There is cardiomegaly.
The pulmonary vascularity is within normal limits. There is aortic
atherosclerosis. No adenopathy. There is degenerative change in the
thoracic spine. There is calcification in the right carotid artery.
IMPRESSION: Cardiomegaly with underlying interstitial edema. Areas of airspace
consolidation in the left base and right mid lung may represent
alveolar edema or pneumonia. Both entities may be present
concurrently. Appearance similar 1 day prior. There is aortic
atherosclerosis as well as calcification in right carotid artery.

Aortic Atherosclerosis ([id number]).
